# Patient Record
Sex: Female | Born: 1940 | Race: Black or African American | Hispanic: No | Marital: Married | Smoking: Former smoker
Health system: Southern US, Community
[De-identification: ages and names within clinical notes are randomized; demographics above are authoritative.]

## PROBLEM LIST (undated history)

## (undated) DIAGNOSIS — E119 Type 2 diabetes mellitus without complications: Secondary | ICD-10-CM

## (undated) DIAGNOSIS — R011 Cardiac murmur, unspecified: Secondary | ICD-10-CM

## (undated) DIAGNOSIS — G629 Polyneuropathy, unspecified: Secondary | ICD-10-CM

## (undated) DIAGNOSIS — Z9981 Dependence on supplemental oxygen: Secondary | ICD-10-CM

## (undated) DIAGNOSIS — R52 Pain, unspecified: Secondary | ICD-10-CM

## (undated) DIAGNOSIS — E039 Hypothyroidism, unspecified: Secondary | ICD-10-CM

## (undated) DIAGNOSIS — I10 Essential (primary) hypertension: Secondary | ICD-10-CM

## (undated) DIAGNOSIS — J449 Chronic obstructive pulmonary disease, unspecified: Secondary | ICD-10-CM

## (undated) DIAGNOSIS — Z87898 Personal history of other specified conditions: Secondary | ICD-10-CM

## (undated) DIAGNOSIS — M1990 Unspecified osteoarthritis, unspecified site: Secondary | ICD-10-CM

## (undated) DIAGNOSIS — IMO0001 Reserved for inherently not codable concepts without codable children: Secondary | ICD-10-CM

## (undated) DIAGNOSIS — I499 Cardiac arrhythmia, unspecified: Secondary | ICD-10-CM

## (undated) DIAGNOSIS — R062 Wheezing: Secondary | ICD-10-CM

## (undated) HISTORY — PX: BREAST BIOPSY: SHX20

## (undated) HISTORY — PX: TUBAL LIGATION: SHX77

## (undated) HISTORY — PX: HERNIA REPAIR: SHX51

## (undated) HISTORY — PX: ABDOMINAL HYSTERECTOMY: SHX81

---

## 2005-01-10 ENCOUNTER — Other Ambulatory Visit: Payer: Self-pay

## 2005-01-10 ENCOUNTER — Emergency Department: Payer: Self-pay | Admitting: Emergency Medicine

## 2005-03-01 ENCOUNTER — Ambulatory Visit: Payer: Self-pay | Admitting: Internal Medicine

## 2005-05-22 ENCOUNTER — Ambulatory Visit: Payer: Self-pay

## 2005-07-11 ENCOUNTER — Ambulatory Visit: Payer: Self-pay | Admitting: Internal Medicine

## 2005-07-23 ENCOUNTER — Ambulatory Visit: Payer: Self-pay | Admitting: Internal Medicine

## 2005-11-20 ENCOUNTER — Ambulatory Visit: Payer: Self-pay | Admitting: Internal Medicine

## 2006-01-10 ENCOUNTER — Ambulatory Visit: Payer: Self-pay

## 2006-11-25 ENCOUNTER — Ambulatory Visit: Payer: Self-pay | Admitting: Internal Medicine

## 2007-08-05 ENCOUNTER — Ambulatory Visit: Payer: Self-pay | Admitting: Family Medicine

## 2008-03-02 LAB — HM DEXA SCAN: HM Dexa Scan: NORMAL

## 2008-04-06 ENCOUNTER — Ambulatory Visit: Payer: Self-pay | Admitting: Gastroenterology

## 2008-04-06 LAB — HM COLONOSCOPY: HM Colonoscopy: NORMAL

## 2008-08-05 ENCOUNTER — Ambulatory Visit: Payer: Self-pay | Admitting: Family Medicine

## 2009-01-29 ENCOUNTER — Emergency Department: Payer: Self-pay | Admitting: Unknown Physician Specialty

## 2009-09-21 ENCOUNTER — Ambulatory Visit: Payer: Self-pay | Admitting: Family Medicine

## 2010-09-29 ENCOUNTER — Ambulatory Visit: Payer: Self-pay | Admitting: Family Medicine

## 2010-10-13 ENCOUNTER — Emergency Department: Payer: Self-pay | Admitting: Emergency Medicine

## 2011-04-26 ENCOUNTER — Ambulatory Visit: Payer: Self-pay | Admitting: Family Medicine

## 2011-05-22 ENCOUNTER — Ambulatory Visit: Payer: Self-pay | Admitting: Surgery

## 2011-10-24 ENCOUNTER — Ambulatory Visit: Payer: Self-pay | Admitting: Family Medicine

## 2012-07-23 ENCOUNTER — Ambulatory Visit: Payer: Self-pay | Admitting: Family Medicine

## 2012-10-15 ENCOUNTER — Ambulatory Visit: Payer: Self-pay | Admitting: Family Medicine

## 2013-01-06 ENCOUNTER — Ambulatory Visit: Payer: Self-pay | Admitting: Otolaryngology

## 2013-01-19 ENCOUNTER — Ambulatory Visit: Payer: Self-pay | Admitting: Family Medicine

## 2013-06-03 LAB — HM PAP SMEAR: HM Pap smear: ABNORMAL

## 2014-01-22 ENCOUNTER — Ambulatory Visit: Payer: Self-pay | Admitting: Family Medicine

## 2014-01-22 LAB — HM MAMMOGRAPHY

## 2014-07-20 LAB — CBC AND DIFFERENTIAL
HCT: 36 % (ref 36–46)
Hemoglobin: 11.3 g/dL — AB (ref 12.0–16.0)
Neutrophils Absolute: 8 /uL
Platelets: 240 10*3/uL (ref 150–399)
WBC: 9.6 10^3/mL

## 2014-10-19 ENCOUNTER — Ambulatory Visit (INDEPENDENT_AMBULATORY_CARE_PROVIDER_SITE_OTHER): Payer: Medicare Other

## 2014-10-19 ENCOUNTER — Ambulatory Visit (INDEPENDENT_AMBULATORY_CARE_PROVIDER_SITE_OTHER): Payer: Medicare Other | Admitting: Podiatry

## 2014-10-19 ENCOUNTER — Encounter: Payer: Self-pay | Admitting: Podiatry

## 2014-10-19 VITALS — BP 149/76 | HR 111 | Resp 16 | Ht 67.0 in | Wt 257.0 lb

## 2014-10-19 DIAGNOSIS — M7740 Metatarsalgia, unspecified foot: Secondary | ICD-10-CM

## 2014-10-19 DIAGNOSIS — M899 Disorder of bone, unspecified: Secondary | ICD-10-CM

## 2014-10-19 DIAGNOSIS — M779 Enthesopathy, unspecified: Secondary | ICD-10-CM

## 2014-10-19 DIAGNOSIS — E1149 Type 2 diabetes mellitus with other diabetic neurological complication: Secondary | ICD-10-CM

## 2014-10-19 DIAGNOSIS — M2040 Other hammer toe(s) (acquired), unspecified foot: Secondary | ICD-10-CM

## 2014-10-19 DIAGNOSIS — E1140 Type 2 diabetes mellitus with diabetic neuropathy, unspecified: Secondary | ICD-10-CM

## 2014-10-19 DIAGNOSIS — M2010 Hallux valgus (acquired), unspecified foot: Secondary | ICD-10-CM

## 2014-10-19 NOTE — Patient Instructions (Signed)
Diabetes and Foot Care Diabetes may cause you to have problems because of poor blood supply (circulation) to your feet and legs. This may cause the skin on your feet to become thinner, break easier, and heal more slowly. Your skin may become dry, and the skin may peel and crack. You may also have nerve damage in your legs and feet causing decreased feeling in them. You may not notice minor injuries to your feet that could lead to infections or more serious problems. Taking care of your feet is one of the most important things you can do for yourself.  HOME CARE INSTRUCTIONS  Wear shoes at all times, even in the house. Do not go barefoot. Bare feet are easily injured.  Check your feet daily for blisters, cuts, and redness. If you cannot see the bottom of your feet, use a mirror or ask someone for help.  Wash your feet with warm water (do not use hot water) and mild soap. Then pat your feet and the areas between your toes until they are completely dry. Do not soak your feet as this can dry your skin.  Apply a moisturizing lotion or petroleum jelly (that does not contain alcohol and is unscented) to the skin on your feet and to dry, brittle toenails. Do not apply lotion between your toes.  Trim your toenails straight across. Do not dig under them or around the cuticle. File the edges of your nails with an emery board or nail file.  Do not cut corns or calluses or try to remove them with medicine.  Wear clean socks or stockings every day. Make sure they are not too tight. Do not wear knee-high stockings since they may decrease blood flow to your legs.  Wear shoes that fit properly and have enough cushioning. To break in new shoes, wear them for just a few hours a day. This prevents you from injuring your feet. Always look in your shoes before you put them on to be sure there are no objects inside.  Do not cross your legs. This may decrease the blood flow to your feet.  If you find a minor scrape,  cut, or break in the skin on your feet, keep it and the skin around it clean and dry. These areas may be cleansed with mild soap and water. Do not cleanse the area with peroxide, alcohol, or iodine.  When you remove an adhesive bandage, be sure not to damage the skin around it.  If you have a wound, look at it several times a day to make sure it is healing.  Do not use heating pads or hot water bottles. They may burn your skin. If you have lost feeling in your feet or legs, you may not know it is happening until it is too late.  Make sure your health care provider performs a complete foot exam at least annually or more often if you have foot problems. Report any cuts, sores, or bruises to your health care provider immediately. SEEK MEDICAL CARE IF:   You have an injury that is not healing.  You have cuts or breaks in the skin.  You have an ingrown nail.  You notice redness on your legs or feet.  You feel burning or tingling in your legs or feet.  You have pain or cramps in your legs and feet.  Your legs or feet are numb.  Your feet always feel cold. SEEK IMMEDIATE MEDICAL CARE IF:   There is increasing redness,   swelling, or pain in or around a wound.  There is a red line that goes up your leg.  Pus is coming from a wound.  You develop a fever or as directed by your health care provider.  You notice a bad smell coming from an ulcer or wound. Document Released: 10/19/2000 Document Revised: 06/24/2013 Document Reviewed: 03/31/2013 Exit[employer] Patient Information 2015 [employer]. This information is not intended to replace advice given to you by your health care provider. Make sure you discuss any questions you have with your health care provider.  

## 2014-10-19 NOTE — Progress Notes (Signed)
   Subjective:    Patient ID: Kristen Schroeder, female    DOB: 09-Nov-1940, 73 y.o.   MRN: [id number]  HPI Comments: 73 year old female presents the office they with her husband for complaints of pain in the bottom of her feet bilaterally. She points to the area of the plantar first MTPJ. She states that she has discomfort in this area which is been ongoing for approximately one year. She denies any recent injury or trauma to the area.  She denies any recent increase in swelling, redness or warmth to the area. She's been soaking her feet Epsom salts as well as alcohol.She has been applying over-the-counter creams without any resolution of symptoms. The patient is diabetic and she states her blood sugars typically run between 123-150. She denies any history of ulceration. She does have some tingling and numbness to her feet for which she is on gabapentin. She denies any intermittent claudication symptoms. No other complaints at this time.  Foot Pain      Review of Systems  Musculoskeletal: Positive for back pain.  All other systems reviewed and are negative.      Objective:   Physical Exam AAO 3, NAD DP/PT pulses palpable bilaterally, CRT less than 3 seconds  Protective sensation slightly decreased with Simms Weinstein monofilament, vibratory sensation decreased, Achilles tendon reflex intact. There is mild discomfort upon palpation on the plantar aspect of the first MTPJ along the sesamoid complex bilaterally. There is prominent sesamoids and metatarsal heads plantarly with atrophy of the fat pad bilaterally. There is no pain with vibratory sensation over the areas. There is no increase in edema, erythema, increase in warmth. There is mild HAV deformity with hammertoe contractures present. There is limitation of first MTPJ range of motion bilaterally. MMT 5/5, ROM WNL except othewise noted.  No open lesions or pre-ulcerative lesions Nails dystrophic, discolored, slightly hypertrophic. They're  not elongated currently at this time as she has recently cut them No pain with calf compression, swelling, warmth, erythema.       Assessment & Plan:  73 year old female presents for bilateral foot pain localized mostly under the sesamoid complex bilaterally.  -X-rays were obtained and reviewed with the patient.  -Treatment options were discussed with the patient including alternatives, risks, complications.  -Discussed with the patient likely etiology of her pain. Due to the diabetes with neuropathy and structural changes to her foot, she would likely benefit from diabetic inserts for shoes. I provided the patient with a prescription today to go to [facility] to have her fitted for shoes/insert. This will also help alleviate some of the high-pressure areas on her foot.  -Discussed the patient the importance of daily foot inspection. Also discussed with her  that due to the likely onychomycosis and her nails if they're painful we can debride them for her. Recommend follow-up every 3 months for diabetic risk assessment/nail care  -Prescribed a compound cream to apply over the symptomatic area.  -Follow-up in 3 months or sooner should any problems arise. In the meantime, call the office with any questions, concerns, change in symptoms.

## 2014-11-14 DIAGNOSIS — J449 Chronic obstructive pulmonary disease, unspecified: Secondary | ICD-10-CM | POA: Diagnosis not present

## 2014-11-18 DIAGNOSIS — E785 Hyperlipidemia, unspecified: Secondary | ICD-10-CM | POA: Diagnosis not present

## 2014-11-18 DIAGNOSIS — Z23 Encounter for immunization: Secondary | ICD-10-CM | POA: Diagnosis not present

## 2014-11-18 DIAGNOSIS — E669 Obesity, unspecified: Secondary | ICD-10-CM | POA: Diagnosis not present

## 2014-11-18 DIAGNOSIS — E1140 Type 2 diabetes mellitus with diabetic neuropathy, unspecified: Secondary | ICD-10-CM | POA: Diagnosis not present

## 2014-11-18 DIAGNOSIS — I10 Essential (primary) hypertension: Secondary | ICD-10-CM | POA: Diagnosis not present

## 2014-11-18 LAB — LIPID PANEL
Cholesterol: 170 mg/dL (ref 0–200)
HDL: 52 mg/dL (ref 35–70)
LDL Cholesterol: 88 mg/dL
LDl/HDL Ratio: 1.7
Triglycerides: 150 mg/dL (ref 40–160)

## 2014-12-15 DIAGNOSIS — J449 Chronic obstructive pulmonary disease, unspecified: Secondary | ICD-10-CM | POA: Diagnosis not present

## 2014-12-29 ENCOUNTER — Ambulatory Visit: Payer: Self-pay | Admitting: Family Medicine

## 2014-12-29 DIAGNOSIS — R079 Chest pain, unspecified: Secondary | ICD-10-CM | POA: Diagnosis not present

## 2014-12-29 DIAGNOSIS — R0600 Dyspnea, unspecified: Secondary | ICD-10-CM | POA: Diagnosis not present

## 2014-12-29 DIAGNOSIS — R091 Pleurisy: Secondary | ICD-10-CM | POA: Diagnosis not present

## 2014-12-29 DIAGNOSIS — M79601 Pain in right arm: Secondary | ICD-10-CM | POA: Diagnosis not present

## 2014-12-29 DIAGNOSIS — Z23 Encounter for immunization: Secondary | ICD-10-CM | POA: Diagnosis not present

## 2015-01-13 DIAGNOSIS — J449 Chronic obstructive pulmonary disease, unspecified: Secondary | ICD-10-CM | POA: Diagnosis not present

## 2015-01-18 ENCOUNTER — Ambulatory Visit (INDEPENDENT_AMBULATORY_CARE_PROVIDER_SITE_OTHER): Payer: Medicare Other | Admitting: Podiatry

## 2015-01-18 DIAGNOSIS — E1149 Type 2 diabetes mellitus with other diabetic neurological complication: Secondary | ICD-10-CM

## 2015-01-18 DIAGNOSIS — M79676 Pain in unspecified toe(s): Secondary | ICD-10-CM | POA: Diagnosis not present

## 2015-01-18 DIAGNOSIS — B351 Tinea unguium: Secondary | ICD-10-CM | POA: Diagnosis not present

## 2015-01-18 DIAGNOSIS — E1140 Type 2 diabetes mellitus with diabetic neuropathy, unspecified: Secondary | ICD-10-CM | POA: Diagnosis not present

## 2015-01-18 NOTE — Patient Instructions (Signed)
Diabetes and Foot Care Diabetes may cause you to have problems because of poor blood supply (circulation) to your feet and legs. This may cause the skin on your feet to become thinner, break easier, and heal more slowly. Your skin may become dry, and the skin may peel and crack. You may also have nerve damage in your legs and feet causing decreased feeling in them. You may not notice minor injuries to your feet that could lead to infections or more serious problems. Taking care of your feet is one of the most important things you can do for yourself.  HOME CARE INSTRUCTIONS  Wear shoes at all times, even in the house. Do not go barefoot. Bare feet are easily injured.  Check your feet daily for blisters, cuts, and redness. If you cannot see the bottom of your feet, use a mirror or ask someone for help.  Wash your feet with warm water (do not use hot water) and mild soap. Then pat your feet and the areas between your toes until they are completely dry. Do not soak your feet as this can dry your skin.  Apply a moisturizing lotion or petroleum jelly (that does not contain alcohol and is unscented) to the skin on your feet and to dry, brittle toenails. Do not apply lotion between your toes.  Trim your toenails straight across. Do not dig under them or around the cuticle. File the edges of your nails with an emery board or nail file.  Do not cut corns or calluses or try to remove them with medicine.  Wear clean socks or stockings every day. Make sure they are not too tight. Do not wear knee-high stockings since they may decrease blood flow to your legs.  Wear shoes that fit properly and have enough cushioning. To break in new shoes, wear them for just a few hours a day. This prevents you from injuring your feet. Always look in your shoes before you put them on to be sure there are no objects inside.  Do not cross your legs. This may decrease the blood flow to your feet.  If you find a minor scrape,  cut, or break in the skin on your feet, keep it and the skin around it clean and dry. These areas may be cleansed with mild soap and water. Do not cleanse the area with peroxide, alcohol, or iodine.  When you remove an adhesive bandage, be sure not to damage the skin around it.  If you have a wound, look at it several times a day to make sure it is healing.  Do not use heating pads or hot water bottles. They may burn your skin. If you have lost feeling in your feet or legs, you may not know it is happening until it is too late.  Make sure your health care provider performs a complete foot exam at least annually or more often if you have foot problems. Report any cuts, sores, or bruises to your health care provider immediately. SEEK MEDICAL CARE IF:   You have an injury that is not healing.  You have cuts or breaks in the skin.  You have an ingrown nail.  You notice redness on your legs or feet.  You feel burning or tingling in your legs or feet.  You have pain or cramps in your legs and feet.  Your legs or feet are numb.  Your feet always feel cold. SEEK IMMEDIATE MEDICAL CARE IF:   There is increasing redness,   swelling, or pain in or around a wound.  There is a red line that goes up your leg.  Pus is coming from a wound.  You develop a fever or as directed by your health care provider.  You notice a bad smell coming from an ulcer or wound. Document Released: 10/19/2000 Document Revised: 06/24/2013 Document Reviewed: 03/31/2013 Exit[employer] Patient Information 2015 [employer]. This information is not intended to replace advice given to you by your health care provider. Make sure you discuss any questions you have with your health care provider.  

## 2015-01-19 NOTE — Progress Notes (Signed)
Patient ID: Kristen KaufmanRuthie L Schroeder, female   DOB: [birth year], 74 y.o.   MRN: [id number]  Subjective: 74 y.o.-year-old female returns the office today for painful, elongated, thickened toenails. Denies any redness or drainage around the nails. States that she has not looked into getting diabetic shoes that she lost the prescription previously. She would like to proceed with diabetic she is a possible. She states that she intermittently continues to have pain on the balls of her feet bilaterally however she denies any swelling or redness overlying the area. Denies any recent injury or trauma. Denies any acute changes since last appointment and no new complaints today. Denies any systemic complaints such as fevers, chills, nausea, vomiting.   Objective: AAO 3, NAD DP/PT pulses palpable, CRT less than 3 seconds Protective sensation decreased with Simms Weinstein monofilament, Achilles tendon reflex intact.  Nails hypertrophic, dystrophic, elongated, brittle, discolored 10. There is tenderness overlying these nails. There is no surrounding erythema or drainage along the nail sites. No open lesions or pre-ulcerative lesions are identified. There is a decrease in medial arch height upon weightbearing and hammertoe contractures are present bilaterally. There is mild diffuse tenderness along the submetatarsal heads 1-5 bilaterally. There is atrophy of the fat pad and prominent metatarsal heads plantarly. There is no area pinpoint bony tenderness or pain with vibratory sensation. There is no pain with MTPJ range of motion. No other areas of tenderness bilateral lower extremities. No overlying edema, erythema, increased warmth. No pain with calf compression, swelling, warmth, erythema.  Assessment: Patient presents with symptomatic onychomycosis; metatarsalgia   Plan: -Treatment options including alternatives, risks, complications were discussed -Nails sharply debrided 10 without complication/bleeding. -Discussed  daily foot inspection. If there are any changes, to call the office immediately.  -I completed paperwork for precertification for diabetic shoes. -Follow-up in 3 months or sooner if any problems are to arise. In the meantime, encouraged to call the office with any questions, concerns, changes symptoms.

## 2015-01-24 DIAGNOSIS — I10 Essential (primary) hypertension: Secondary | ICD-10-CM | POA: Diagnosis not present

## 2015-01-24 DIAGNOSIS — E119 Type 2 diabetes mellitus without complications: Secondary | ICD-10-CM | POA: Diagnosis not present

## 2015-01-24 DIAGNOSIS — R0602 Shortness of breath: Secondary | ICD-10-CM | POA: Diagnosis not present

## 2015-01-24 DIAGNOSIS — I272 Other secondary pulmonary hypertension: Secondary | ICD-10-CM | POA: Diagnosis not present

## 2015-02-01 DIAGNOSIS — E669 Obesity, unspecified: Secondary | ICD-10-CM | POA: Diagnosis not present

## 2015-02-01 DIAGNOSIS — Z23 Encounter for immunization: Secondary | ICD-10-CM | POA: Diagnosis not present

## 2015-02-01 DIAGNOSIS — E1140 Type 2 diabetes mellitus with diabetic neuropathy, unspecified: Secondary | ICD-10-CM | POA: Diagnosis not present

## 2015-02-03 ENCOUNTER — Telehealth: Payer: Self-pay | Admitting: Podiatry

## 2015-02-03 NOTE — Telephone Encounter (Signed)
LEFT MESSAGE TO PATIENT TO CALL AND SET UP APPOINTMENT WITH DR.WAGONER TO BE SCANNED FOR DIABETIC SHOES

## 2015-02-13 DIAGNOSIS — J449 Chronic obstructive pulmonary disease, unspecified: Secondary | ICD-10-CM | POA: Diagnosis not present

## 2015-02-15 ENCOUNTER — Encounter: Payer: Self-pay | Admitting: Podiatry

## 2015-02-15 ENCOUNTER — Ambulatory Visit (INDEPENDENT_AMBULATORY_CARE_PROVIDER_SITE_OTHER): Payer: Medicare Other | Admitting: Podiatry

## 2015-02-15 DIAGNOSIS — E1140 Type 2 diabetes mellitus with diabetic neuropathy, unspecified: Secondary | ICD-10-CM

## 2015-02-15 DIAGNOSIS — E1149 Type 2 diabetes mellitus with other diabetic neurological complication: Secondary | ICD-10-CM

## 2015-02-15 NOTE — Progress Notes (Signed)
Patient ID: Gwyndolyn KaufmanRuthie L Schroeder, female   DOB: 25-May-1941, 74 y.o.   MRN: [id number]  Patient was measured for diabetic shoes today. Will follow up once shoes arrive. She had no new complaints.

## 2015-02-16 DIAGNOSIS — Z23 Encounter for immunization: Secondary | ICD-10-CM | POA: Diagnosis not present

## 2015-02-16 DIAGNOSIS — J40 Bronchitis, not specified as acute or chronic: Secondary | ICD-10-CM | POA: Diagnosis not present

## 2015-03-10 DIAGNOSIS — I270 Primary pulmonary hypertension: Secondary | ICD-10-CM | POA: Diagnosis not present

## 2015-03-10 DIAGNOSIS — J9611 Chronic respiratory failure with hypoxia: Secondary | ICD-10-CM | POA: Diagnosis not present

## 2015-03-10 DIAGNOSIS — J441 Chronic obstructive pulmonary disease with (acute) exacerbation: Secondary | ICD-10-CM | POA: Diagnosis not present

## 2015-03-15 DIAGNOSIS — J449 Chronic obstructive pulmonary disease, unspecified: Secondary | ICD-10-CM | POA: Diagnosis not present

## 2015-03-17 DIAGNOSIS — Z23 Encounter for immunization: Secondary | ICD-10-CM | POA: Diagnosis not present

## 2015-03-17 DIAGNOSIS — J309 Allergic rhinitis, unspecified: Secondary | ICD-10-CM | POA: Diagnosis not present

## 2015-03-17 DIAGNOSIS — J301 Allergic rhinitis due to pollen: Secondary | ICD-10-CM | POA: Diagnosis not present

## 2015-03-17 DIAGNOSIS — I10 Essential (primary) hypertension: Secondary | ICD-10-CM | POA: Diagnosis not present

## 2015-03-17 DIAGNOSIS — J9611 Chronic respiratory failure with hypoxia: Secondary | ICD-10-CM | POA: Diagnosis not present

## 2015-03-17 DIAGNOSIS — I270 Primary pulmonary hypertension: Secondary | ICD-10-CM | POA: Diagnosis not present

## 2015-03-17 DIAGNOSIS — J449 Chronic obstructive pulmonary disease, unspecified: Secondary | ICD-10-CM | POA: Diagnosis not present

## 2015-03-17 DIAGNOSIS — E039 Hypothyroidism, unspecified: Secondary | ICD-10-CM | POA: Diagnosis not present

## 2015-03-17 DIAGNOSIS — E780 Pure hypercholesterolemia: Secondary | ICD-10-CM | POA: Diagnosis not present

## 2015-03-17 DIAGNOSIS — E119 Type 2 diabetes mellitus without complications: Secondary | ICD-10-CM | POA: Diagnosis not present

## 2015-03-17 LAB — HEPATIC FUNCTION PANEL
ALT: 5 U/L — AB (ref 7–35)
AST: 13 U/L (ref 13–35)
Alkaline Phosphatase: 87 U/L (ref 25–125)

## 2015-03-17 LAB — TSH: TSH: 2.51 u[IU]/mL (ref 0.41–5.90)

## 2015-03-17 LAB — BASIC METABOLIC PANEL
BUN: 16 mg/dL (ref 4–21)
Creatinine: 0.7 mg/dL (ref 0.5–1.1)
Glucose: 89 mg/dL
Potassium: 3.8 mmol/L (ref 3.4–5.3)
Sodium: 147 mmol/L (ref 137–147)

## 2015-03-17 LAB — HEMOGLOBIN A1C: Hgb A1c MFr Bld: 5.3 % (ref 4.0–6.0)

## 2015-03-29 ENCOUNTER — Other Ambulatory Visit: Payer: Self-pay | Admitting: Family Medicine

## 2015-03-29 ENCOUNTER — Ambulatory Visit
Admission: RE | Admit: 2015-03-29 | Discharge: 2015-03-29 | Disposition: A | Discharge status: Home / Self Care | Payer: Medicare Other | Source: Ambulatory Visit | Attending: Family Medicine | Admitting: Family Medicine

## 2015-03-29 DIAGNOSIS — Z23 Encounter for immunization: Secondary | ICD-10-CM | POA: Diagnosis not present

## 2015-03-29 DIAGNOSIS — J449 Chronic obstructive pulmonary disease, unspecified: Secondary | ICD-10-CM | POA: Diagnosis not present

## 2015-03-29 DIAGNOSIS — J441 Chronic obstructive pulmonary disease with (acute) exacerbation: Secondary | ICD-10-CM

## 2015-03-29 DIAGNOSIS — R05 Cough: Secondary | ICD-10-CM | POA: Diagnosis not present

## 2015-04-15 DIAGNOSIS — J449 Chronic obstructive pulmonary disease, unspecified: Secondary | ICD-10-CM | POA: Diagnosis not present

## 2015-04-19 ENCOUNTER — Ambulatory Visit: Payer: Medicare Other

## 2015-04-19 ENCOUNTER — Other Ambulatory Visit: Payer: Self-pay | Admitting: Family Medicine

## 2015-04-26 ENCOUNTER — Encounter: Payer: Self-pay | Admitting: Family Medicine

## 2015-04-27 ENCOUNTER — Other Ambulatory Visit: Payer: Self-pay | Admitting: Family Medicine

## 2015-04-27 DIAGNOSIS — Z1231 Encounter for screening mammogram for malignant neoplasm of breast: Secondary | ICD-10-CM

## 2015-05-02 ENCOUNTER — Other Ambulatory Visit: Payer: Self-pay | Admitting: Family Medicine

## 2015-05-02 ENCOUNTER — Ambulatory Visit
Admission: RE | Admit: 2015-05-02 | Discharge: 2015-05-02 | Disposition: A | Discharge status: Home / Self Care | Payer: Medicare Other | Source: Ambulatory Visit | Attending: Family Medicine | Admitting: Family Medicine

## 2015-05-02 DIAGNOSIS — Z1231 Encounter for screening mammogram for malignant neoplasm of breast: Secondary | ICD-10-CM | POA: Diagnosis not present

## 2015-05-05 ENCOUNTER — Telehealth: Payer: Self-pay | Admitting: Family Medicine

## 2015-05-05 NOTE — Telephone Encounter (Signed)
Pt called having pain under rib cage .Marland Kitchen. Thinks it might be gas.  Can you call her back.  [phone].  Thanks, tp

## 2015-05-05 NOTE — Telephone Encounter (Signed)
Called, no answer, unable to leave a message-aa

## 2015-05-10 NOTE — Telephone Encounter (Signed)
Patient  Has been advised to be seen.  She states the pain under her ribs is gone but she does have pelvic pain.  No change in BM, no urinary symptoms and no vaginal d/c.    Explained that it is difficult to diagnose this type of symptoms over the phone.  Patient understood and is making appointment.

## 2015-05-11 ENCOUNTER — Encounter: Payer: Self-pay | Admitting: Emergency Medicine

## 2015-05-11 ENCOUNTER — Encounter: Payer: Self-pay | Admitting: Family Medicine

## 2015-05-11 ENCOUNTER — Ambulatory Visit
Admission: RE | Admit: 2015-05-11 | Discharge: 2015-05-11 | Disposition: A | Discharge status: Home / Self Care | Payer: Medicare Other | Source: Ambulatory Visit | Attending: Family Medicine | Admitting: Family Medicine

## 2015-05-11 ENCOUNTER — Telehealth: Payer: Self-pay | Admitting: Emergency Medicine

## 2015-05-11 ENCOUNTER — Ambulatory Visit (INDEPENDENT_AMBULATORY_CARE_PROVIDER_SITE_OTHER): Payer: Medicare Other | Admitting: Family Medicine

## 2015-05-11 VITALS — BP 122/62 | HR 86 | Temp 97.7°F | Resp 16 | Wt 240.0 lb

## 2015-05-11 DIAGNOSIS — E119 Type 2 diabetes mellitus without complications: Secondary | ICD-10-CM | POA: Insufficient documentation

## 2015-05-11 DIAGNOSIS — E785 Hyperlipidemia, unspecified: Secondary | ICD-10-CM | POA: Insufficient documentation

## 2015-05-11 DIAGNOSIS — F419 Anxiety disorder, unspecified: Secondary | ICD-10-CM | POA: Insufficient documentation

## 2015-05-11 DIAGNOSIS — M79604 Pain in right leg: Secondary | ICD-10-CM | POA: Diagnosis not present

## 2015-05-11 DIAGNOSIS — M25551 Pain in right hip: Secondary | ICD-10-CM | POA: Diagnosis not present

## 2015-05-11 DIAGNOSIS — J42 Unspecified chronic bronchitis: Secondary | ICD-10-CM | POA: Insufficient documentation

## 2015-05-11 DIAGNOSIS — R6889 Other general symptoms and signs: Secondary | ICD-10-CM | POA: Insufficient documentation

## 2015-05-11 DIAGNOSIS — M25559 Pain in unspecified hip: Secondary | ICD-10-CM | POA: Diagnosis present

## 2015-05-11 DIAGNOSIS — G629 Polyneuropathy, unspecified: Secondary | ICD-10-CM | POA: Insufficient documentation

## 2015-05-11 DIAGNOSIS — F32A Depression, unspecified: Secondary | ICD-10-CM | POA: Insufficient documentation

## 2015-05-11 DIAGNOSIS — E039 Hypothyroidism, unspecified: Secondary | ICD-10-CM | POA: Insufficient documentation

## 2015-05-11 DIAGNOSIS — I10 Essential (primary) hypertension: Secondary | ICD-10-CM | POA: Insufficient documentation

## 2015-05-11 DIAGNOSIS — I509 Heart failure, unspecified: Secondary | ICD-10-CM | POA: Insufficient documentation

## 2015-05-11 DIAGNOSIS — F329 Major depressive disorder, single episode, unspecified: Secondary | ICD-10-CM | POA: Insufficient documentation

## 2015-05-11 DIAGNOSIS — K219 Gastro-esophageal reflux disease without esophagitis: Secondary | ICD-10-CM | POA: Insufficient documentation

## 2015-05-11 DIAGNOSIS — G4733 Obstructive sleep apnea (adult) (pediatric): Secondary | ICD-10-CM | POA: Insufficient documentation

## 2015-05-11 DIAGNOSIS — E669 Obesity, unspecified: Secondary | ICD-10-CM | POA: Insufficient documentation

## 2015-05-11 DIAGNOSIS — N870 Mild cervical dysplasia: Secondary | ICD-10-CM | POA: Insufficient documentation

## 2015-05-11 DIAGNOSIS — M5137 Other intervertebral disc degeneration, lumbosacral region: Secondary | ICD-10-CM | POA: Insufficient documentation

## 2015-05-11 DIAGNOSIS — M1611 Unilateral primary osteoarthritis, right hip: Secondary | ICD-10-CM | POA: Diagnosis not present

## 2015-05-11 DIAGNOSIS — J309 Allergic rhinitis, unspecified: Secondary | ICD-10-CM | POA: Insufficient documentation

## 2015-05-11 DIAGNOSIS — M1990 Unspecified osteoarthritis, unspecified site: Secondary | ICD-10-CM | POA: Insufficient documentation

## 2015-05-11 NOTE — Telephone Encounter (Signed)
-----   Message from Maple Hudsonichard L Gilbert Jr., MD sent at 05/11/2015  1:16 PM EDT ----- Arthritic changes noted.

## 2015-05-11 NOTE — Telephone Encounter (Signed)
Pt informed of xray results. Wants to know if there is anything that she can take for it?

## 2015-05-11 NOTE — Progress Notes (Signed)
Patient ID: Kristen Schroeder, female   DOB: [birth year], 74 y.o.   MRN: [id number]    Subjective:  Leg Pain  The incident occurred more than 1 week ago. There was no injury mechanism. Pain location: right groin area. The quality of the pain is described as aching. The pain has been improving since onset. Pertinent negatives include no inability to bear weight, loss of motion, loss of sensation, muscle weakness, numbness or tingling. She reports no foreign bodies present. The symptoms are aggravated by movement. She has tried acetaminophen for the symptoms.  Pt reports that she was hurting in her right side, she had been having a lot of gas and then her leg/hip/groin started hurting but reports that she is getting better.  No urinary sx, or nausea vomiting.   Prior to Admission medications   Medication Sig Start Date End Date Taking? Authorizing Provider  ALBUTEROL IN Inhale into the lungs daily.   Yes Historical Provider, MD  ALPRAZolam Prudy Feeler) 0.5 MG tablet Take by mouth. 12/26/14  Yes Historical Provider, MD  cetirizine (ZYRTEC) 10 MG tablet Take 10 mg by mouth daily.   Yes Historical Provider, MD  DULERA 100-5 MCG/ACT AERO  09/06/14  Yes Historical Provider, MD  fluticasone (FLONASE) 50 MCG/ACT nasal spray Place into both nostrils daily.   Yes Historical Provider, MD  furosemide (LASIX) 20 MG tablet Take by mouth. 12/20/14  Yes Historical Provider, MD  gabapentin (NEURONTIN) 300 MG capsule Take by mouth. 07/19/14  Yes Historical Provider, MD  hydrochlorothiazide (HYDRODIURIL) 25 MG tablet Take by mouth. 09/21/13  Yes Historical Provider, MD  HYDROcodone-acetaminophen (NORCO) 10-325 MG per tablet Take by mouth. 02/01/15  Yes Historical Provider, MD  ipratropium-albuterol (DUONEB) 0.5-2.5 (3) MG/3ML SOLN Take 3 mLs by nebulization daily.   Yes Historical Provider, MD  levothyroxine (SYNTHROID, LEVOTHROID) 125 MCG tablet Take by mouth. 03/15/15  Yes Historical Provider, MD  lisinopril (PRINIVIL,ZESTRIL) 10  MG tablet Take by mouth. 03/24/15  Yes Historical Provider, MD  loratadine (CLARITIN) 10 MG tablet Take by mouth. 08/24/14  Yes Historical Provider, MD  metFORMIN (GLUCOPHAGE) 1000 MG tablet Take by mouth. 11/16/14  Yes Historical Provider, MD  MULTIPLE VITAMIN PO Take by mouth.   Yes Historical Provider, MD  Omeprazole 20 MG TBEC Take by mouth. 12/08/13  Yes Historical Provider, MD  pravastatin (PRAVACHOL) 10 MG tablet Take by mouth. 11/19/14  Yes Historical Provider, MD  SPIRIVA HANDIHALER 18 MCG inhalation capsule  09/06/14  Yes Historical Provider, MD  cyclobenzaprine (FLEXERIL) 10 MG tablet Take 10 mg by mouth daily.    Historical Provider, MD    Patient Active Problem List   Diagnosis Date Noted  . Nonspecific abnormal finding 05/11/2015  . Allergic rhinitis 05/11/2015  . Anxiety 05/11/2015  . Bronchitis, chronic 05/11/2015  . CCF (congestive cardiac failure) 05/11/2015  . Clinical depression 05/11/2015  . Diabetes mellitus, type 2 05/11/2015  . 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 Essential (primary) hypertension 05/11/2015  . Acid reflux 05/11/2015  . HLD (hyperlipidemia) 05/11/2015  . Adult hypothyroidism 05/11/2015  . Cervical dysplasia, mild 05/11/2015  . Neuropathy 05/11/2015  . Adiposity 05/11/2015  . Obstructive apnea 05/11/2015  . Arthritis, degenerative 05/11/2015    History reviewed. No pertinent past medical history.  History   Social History  . Marital Status: Married    Spouse Name: N/A  . Number of Children: N/A  . Years of Education: N/A   Occupational History  . Not on file.   Social  History Main Topics  . Smoking status: Former Smoker -- 0.50 packs/day for 15 years  . Smokeless tobacco: Not on file  . Alcohol Use: No  . Drug Use: No  . Sexual Activity: Not on file   Other Topics Concern  . Not on file   Social History Narrative    Allergies  Allergen Reactions  . Codeine Nausea And Vomiting and Nausea Only    Review  of Systems  Constitutional: Negative.   HENT: Negative.   Eyes: Negative.   Respiratory: Negative.   Cardiovascular: Negative.   Gastrointestinal: Negative.   Genitourinary: Negative.   Musculoskeletal: Positive for joint pain.  Skin: Negative.   Neurological: Negative.  Negative for tingling and numbness.  Endo/Heme/Allergies: Negative.   Psychiatric/Behavioral: Negative.     Immunization History  Administered Date(s) Administered  . Pneumococcal Conjugate-13 08/24/2014  . Pneumococcal Polysaccharide-23 11/03/1999, 07/23/2012  . Tdap 07/23/2012  . Zoster 07/23/2012   Objective:  BP 122/62 mmHg  Pulse 86  Temp(Src) 97.7 F (36.5 C) (Oral)  Resp 16  Wt 240 lb (108.863 kg)  SpO2 90%  Physical Exam  Constitutional: She is oriented to person, place, and time and well-developed, well-nourished, and in no distress.  HENT:  Head: Normocephalic and atraumatic.  Eyes: Conjunctivae are normal. Pupils are equal, round, and reactive to light.  Neck: Normal range of motion. Neck supple.  Cardiovascular: Normal rate, regular rhythm, normal heart sounds and intact distal pulses.   Pulmonary/Chest: Effort normal and breath sounds normal.  On O2 by nasal cannula  Abdominal: Soft. Bowel sounds are normal.  Musculoskeletal:  Straight leg raise negative. Figure of 4 on the affected side.  Neurological: She is alert and oriented to person, place, and time.  Lower extremity exam grossly nonfocal  Skin: Skin is warm and dry.  Psychiatric: Mood, memory, affect and judgment normal.    Lab Results  Component Value Date   WBC 9.6 07/20/2014   HGB 11.3* 07/20/2014   HCT 36 07/20/2014   PLT 240 07/20/2014   CHOL 170 11/18/2014   TRIG 150 11/18/2014   HDL 52 11/18/2014   LDLCALC 88 11/18/2014   TSH 2.51 03/17/2015   HGBA1C 5.3 03/17/2015    CMP     Component Value Date/Time   NA 147 03/17/2015   K 3.8 03/17/2015   BUN 16 03/17/2015   CREATININE 0.7 03/17/2015   AST 13  03/17/2015   ALT 5* 03/17/2015   ALKPHOS 87 03/17/2015    Assessment and Plan :  Right hip osteoarthritis  this is most likely etiology of this pain. Obtain x-ray today. Patient not a candidate for NSAID but prednisone taper may help. May also need orthopedic referral. This could be pain referral from her back but nothing is most likely hip.  Julieanne Mansonichard Gilbert MD [facility] 05/11/2015 11:05 AM

## 2015-05-12 NOTE — Telephone Encounter (Signed)
She has pain pills available for her back. I hope that enables her to get some rest.

## 2015-05-13 NOTE — Telephone Encounter (Signed)
Patient advised  ED 

## 2015-05-15 DIAGNOSIS — J449 Chronic obstructive pulmonary disease, unspecified: Secondary | ICD-10-CM | POA: Diagnosis not present

## 2015-05-16 ENCOUNTER — Other Ambulatory Visit: Payer: Self-pay | Admitting: Family Medicine

## 2015-05-20 ENCOUNTER — Telehealth: Payer: Self-pay | Admitting: Family Medicine

## 2015-05-20 DIAGNOSIS — M5136 Other intervertebral disc degeneration, lumbar region: Secondary | ICD-10-CM

## 2015-05-20 MED ORDER — HYDROCODONE-ACETAMINOPHEN 10-325 MG PO TABS: 1.0000 | ORAL_TABLET | ORAL | Status: DC | PRN

## 2015-05-20 NOTE — Telephone Encounter (Signed)
Pt contacted office for refill request on the following medications:  HYDROcodone-acetaminophen (NORCO) 10-325 MG.  YQ#[id number]/BMCB#[phone]/MW

## 2015-05-20 NOTE — Telephone Encounter (Signed)
Rx printed I hope.

## 2015-05-20 NOTE — Telephone Encounter (Signed)
Dr. Reece AgarG I need you to do this one. Thanks  ED

## 2015-05-23 ENCOUNTER — Telehealth: Payer: Self-pay

## 2015-05-23 NOTE — Telephone Encounter (Signed)
Patient states her son dropped forms for her here the week you were on vacation-[facility] [employer] [facility] [employer]- to fill out due to her been on oxygen if power or something happens she can get help. I have not seen this but I was out some of the weeks. Please review. Thank you-aa

## 2015-05-23 NOTE — Telephone Encounter (Signed)
Pt advised, RX placed up front-aa 

## 2015-05-23 NOTE — Telephone Encounter (Signed)
I will check papers at home but I think that I signed that a long time ago.

## 2015-05-24 NOTE — Telephone Encounter (Signed)
Dr. Sullivan LoneGilbert can you look at this tonight, I think this has to be in by 21st Thursday otherwise they will disconnect her service. Thank you

## 2015-05-26 NOTE — Telephone Encounter (Signed)
Pt advised tha we have the form and it was filled out at the end of June but can not say for sure if it was faxed over. Went ahead and faxed over today.-aa

## 2015-05-26 NOTE — Telephone Encounter (Signed)
LMTCB-speak to ana please-aa

## 2015-05-26 NOTE — Telephone Encounter (Signed)
I do not have any papers. I'm pretty sure assigned him. Somebody will fax over something today I will be happy to resign  on that. It is so they want her off her electricity, her being on oxygen.

## 2015-06-10 ENCOUNTER — Telehealth: Payer: Self-pay | Admitting: Family Medicine

## 2015-06-10 ENCOUNTER — Encounter: Payer: Self-pay | Admitting: Family Medicine

## 2015-06-10 ENCOUNTER — Ambulatory Visit (INDEPENDENT_AMBULATORY_CARE_PROVIDER_SITE_OTHER): Payer: Medicare Other | Admitting: Family Medicine

## 2015-06-10 VITALS — BP 140/88 | HR 84 | Temp 98.3°F | Resp 22

## 2015-06-10 DIAGNOSIS — I10 Essential (primary) hypertension: Secondary | ICD-10-CM

## 2015-06-10 DIAGNOSIS — R739 Hyperglycemia, unspecified: Secondary | ICD-10-CM | POA: Diagnosis not present

## 2015-06-10 DIAGNOSIS — E118 Type 2 diabetes mellitus with unspecified complications: Secondary | ICD-10-CM

## 2015-06-10 LAB — POCT URINALYSIS DIPSTICK
Bilirubin, UA: NEGATIVE
Blood, UA: NEGATIVE
Glucose, UA: 250
Ketones, UA: NEGATIVE
Leukocytes, UA: NEGATIVE
Nitrite, UA: NEGATIVE
Protein, UA: NEGATIVE
Spec Grav, UA: 1.01
Urobilinogen, UA: 0.2
pH, UA: 7

## 2015-06-10 LAB — POCT GLYCOSYLATED HEMOGLOBIN (HGB A1C): Hemoglobin A1C: 8.3

## 2015-06-10 MED ORDER — SAXAGLIPTIN HCL 2.5 MG PO TABS: 2.5000 mg | ORAL_TABLET | Freq: Every day | ORAL | Status: DC

## 2015-06-10 NOTE — Progress Notes (Signed)
Patient ID: Kristen Schroeder, female   DOB: April 05, 1941, 74 y.o.   MRN: [id number]   Patient: Kristen Schroeder Female    DOB: January 07, 1941   74 y.o.   MRN: [id number] Visit Date: 06/10/2015  Today's Provider: Dortha Kern, PA   Chief Complaint  Patient presents with  . Hyperglycemia   Subjective:    HPI  This 74 year old female diabetic with COPD requiring oxygen 24 hours a day at 2-4 LPM developed urinary frequency with elevated blood sugar over the past 2 weeks. Has a history to peripheral neuropathy due to diabetes. Has had BP 135-140/70-72 at home. Some blurring of eye sight. 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  Patient Active Problem List   Diagnosis Date Noted  . Nonspecific abnormal finding 05/11/2015  . Allergic rhinitis 05/11/2015  . Anxiety 05/11/2015  . Bronchitis, chronic 05/11/2015  . CCF (congestive cardiac failure) 05/11/2015  . Clinical depression 05/11/2015  . Diabetes mellitus, type 2 05/11/2015  . 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 Essential (primary) hypertension 05/11/2015  . Acid reflux 05/11/2015  . HLD (hyperlipidemia) 05/11/2015  . Adult hypothyroidism 05/11/2015  . Cervical dysplasia, mild 05/11/2015  . Neuropathy 05/11/2015  . Adiposity 05/11/2015  . Obstructive apnea 05/11/2015  . Arthritis, degenerative 05/11/2015    Past Surgical History  Procedure Laterality Date  . Abdominal hysterectomy    . Tubal ligation     Allergies  Allergen Reactions  . Codeine Nausea And Vomiting and Nausea Only   Family History  Problem Relation Age of Onset  . Heart disease Mother   . Drug abuse Other   . Hypertension Other      Previous Medications   ALBUTEROL IN    Inhale into the lungs daily.   ALPRAZOLAM (XANAX) 0.5 MG TABLET    Take by mouth.   CETIRIZINE (ZYRTEC) 10 MG TABLET    Take 10 mg by mouth daily.   CYCLOBENZAPRINE (FLEXERIL) 10 MG TABLET    Take 10 mg by mouth daily.   DULERA 100-5  MCG/ACT AERO       FLUTICASONE (FLONASE) 50 MCG/ACT NASAL SPRAY    Place into both nostrils daily.   FUROSEMIDE (LASIX) 20 MG TABLET    Take by mouth.   GABAPENTIN (NEURONTIN) 300 MG CAPSULE    Take by mouth.   HYDROCODONE-ACETAMINOPHEN (NORCO) 10-325 MG PER TABLET    Take 1-2 tablets by mouth every 4 (four) hours as needed.   IPRATROPIUM-ALBUTEROL (DUONEB) 0.5-2.5 (3) MG/3ML SOLN    Take 3 mLs by nebulization daily.   LEVOTHYROXINE (SYNTHROID, LEVOTHROID) 125 MCG TABLET    Take by mouth.   LISINOPRIL (PRINIVIL,ZESTRIL) 10 MG TABLET    Take by mouth.   LORATADINE (CLARITIN) 10 MG TABLET    Take by mouth.   METFORMIN (GLUCOPHAGE) 1000 MG TABLET    Take by mouth.   MULTIPLE VITAMIN PO    Take by mouth.   OMEPRAZOLE 20 MG TBEC    Take by mouth.   PRAVASTATIN (PRAVACHOL) 10 MG TABLET    Take by mouth.   SPIRIVA HANDIHALER 18 MCG INHALATION CAPSULE        Review of Systems  Constitutional: Negative for fever and chills.  HENT: Negative.   Eyes: Positive for visual disturbance.       Recent blurring of vision  Respiratory: Positive for shortness of breath. Negative for cough and wheezing.        Due to  COPD and on oxygen 2-4 LPM all day and night.  Cardiovascular: Negative.   Gastrointestinal: Negative.   Endocrine: Positive for polyuria.  Genitourinary: Positive for frequency.  Musculoskeletal: Positive for arthralgias.       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    History  Substance Use Topics  . Smoking status: Former Smoker -- 0.50 packs/day for 15 years  . Smokeless tobacco: Not on file  . Alcohol Use: No   Objective:   BP 190/110 mmHg  Pulse 108  Temp(Src) 98.3 F (36.8 C) (Oral)  Resp 22  SpO2 95% Repeat of BP 168/104 with pulse of 90 and 15 minutes later 140/88 with pulse of 84.  Physical Exam  Constitutional: She is oriented to person, place, and time. She appears well-developed and well-nourished. No distress.  HENT:  Head: Normocephalic and atraumatic.   Right Ear: Hearing and external ear normal.  Left Ear: Hearing and external ear normal.  Nose: Nose normal.  Eyes: Conjunctivae, EOM and lids are normal. Pupils are equal, round, and reactive to light. Right eye exhibits no discharge. Left eye exhibits no discharge. No scleral icterus.  Neck: Neck supple.  Cardiovascular: Regular rhythm.   Initially tachycardic but settled down when anxiety lessened.  Pulmonary/Chest: She is in respiratory distress.  Constant use of oxygen at 2 LPM.  Abdominal: Soft. Bowel sounds are normal.  Musculoskeletal: Normal range of motion.  Neurological: She is alert and oriented to person, place, and time.  Skin: Skin is intact. No lesion and no rash noted.  Psychiatric: Her speech is normal and behavior is normal. Thought content normal. Her mood appears anxious.      Assessment & Plan:     1. Hyperglycemia Blood sugar was 350 at home and has been ranging 250-300 recently. Presently on Metformin 1000 mg BID and trying to follow a diabetic diet. Will add Onglyza and recheck labs. - POCT HgB A1C - POCT urinalysis dipstick  2. Essential (primary) hypertension Presently better with calming of anxiety. Presently treated with Furosemide 20 mg qd and Lisinopril 10 mg qd. Continue present dosages and recheck labs. - POCT urinalysis dipstick - Comprehensive metabolic panel - CBC with Differential/Platelet - Lipid panel  3. Type 2 diabetes mellitus with complication Recent loss of control. Peripheral neuropathy unchanged. Using Gabapentin for burning discomfort in legs. Will add Onglyza 2.5 mg and keep follow up appointment for AWV next week. hgb A1C was 8.3% today. Will check follow up labs. - POCT HgB A1C - POCT urinalysis dipstick - Comprehensive metabolic panel - CBC with Differential/Platelet - Lipid panel - saxagliptin HCl (ONGLYZA) 2.5 MG TABS tablet; Take 1 tablet (2.5 mg total) by mouth daily.  Dispense: 14 tablet; Refill: 0

## 2015-06-10 NOTE — Telephone Encounter (Signed)
patien would like you to call her back about her glucose levels.  [phone]  Thanks Barth Kirks

## 2015-06-10 NOTE — Telephone Encounter (Signed)
Patient states sometimes her sugar is 209 and this morning 199 fasting, this week and some the week before that, she has had some in 300 varies. She takes Metformin 1000 mg  BID no other medications. She was on Prednisone a month ago and finished antibiotics 3 weeks ago. She has not changed her diet nothing significant. Please review. Dr. Wonda Olds' patient-aa

## 2015-06-10 NOTE — Telephone Encounter (Signed)
appt made with [facility]

## 2015-06-10 NOTE — Telephone Encounter (Signed)
She probably needs to be seen to make sure she doesn't need another medication added or to make sure she doesn't have a UTI or something.  Blood sugars can elevate with infection.  Does Dr. Sullivan Lone have any availabilities next week?

## 2015-06-11 LAB — COMPREHENSIVE METABOLIC PANEL
ALT: 6 IU/L (ref 0–32)
AST: 11 IU/L (ref 0–40)
Albumin/Globulin Ratio: 1.7 (ref 1.1–2.5)
Albumin: 3.9 g/dL (ref 3.5–4.8)
Alkaline Phosphatase: 83 IU/L (ref 39–117)
BUN/Creatinine Ratio: 13 (ref 11–26)
BUN: 8 mg/dL (ref 8–27)
Bilirubin Total: 0.3 mg/dL (ref 0.0–1.2)
CO2: 31 mmol/L — ABNORMAL HIGH (ref 18–29)
Calcium: 9.6 mg/dL (ref 8.7–10.3)
Chloride: 97 mmol/L (ref 97–108)
Creatinine, Ser: 0.61 mg/dL (ref 0.57–1.00)
GFR calc Af Amer: 104 mL/min/{1.73_m2} (ref >59)
GFR calc non Af Amer: 90 mL/min/{1.73_m2} (ref >59)
Globulin, Total: 2.3 g/dL (ref 1.5–4.5)
Glucose: 263 mg/dL — ABNORMAL HIGH (ref 65–99)
Potassium: 4.3 mmol/L (ref 3.5–5.2)
Sodium: 141 mmol/L (ref 134–144)
Total Protein: 6.2 g/dL (ref 6.0–8.5)

## 2015-06-11 LAB — CBC WITH DIFFERENTIAL/PLATELET
Basophils Absolute: 0 10*3/uL (ref 0.0–0.2)
Basos: 0 %
EOS (ABSOLUTE): 0.2 10*3/uL (ref 0.0–0.4)
Eos: 3 %
Hematocrit: 37 % (ref 34.0–46.6)
Hemoglobin: 11.3 g/dL (ref 11.1–15.9)
Immature Grans (Abs): 0 10*3/uL (ref 0.0–0.1)
Immature Granulocytes: 0 %
Lymphocytes Absolute: 1.6 10*3/uL (ref 0.7–3.1)
Lymphs: 19 %
MCH: 26.7 pg (ref 26.6–33.0)
MCHC: 30.5 g/dL — ABNORMAL LOW (ref 31.5–35.7)
MCV: 88 fL (ref 79–97)
Monocytes Absolute: 0.7 10*3/uL (ref 0.1–0.9)
Monocytes: 8 %
Neutrophils Absolute: 6 10*3/uL (ref 1.4–7.0)
Neutrophils: 70 %
Platelets: 247 10*3/uL (ref 150–379)
RBC: 4.23 x10E6/uL (ref 3.77–5.28)
RDW: 15.2 % (ref 12.3–15.4)
WBC: 8.5 10*3/uL (ref 3.4–10.8)

## 2015-06-11 LAB — LIPID PANEL
Chol/HDL Ratio: 3.3 ratio units (ref 0.0–4.4)
Cholesterol, Total: 174 mg/dL (ref 100–199)
HDL: 53 mg/dL (ref >39)
LDL Calculated: 91 mg/dL (ref 0–99)
Triglycerides: 150 mg/dL — ABNORMAL HIGH (ref 0–149)
VLDL Cholesterol Cal: 30 mg/dL (ref 5–40)

## 2015-06-13 ENCOUNTER — Telehealth: Payer: Self-pay

## 2015-06-13 NOTE — Telephone Encounter (Signed)
-----   Message from Tamsen Roers, [place] sent at 06/13/2015  8:41 AM EDT ----- Blood sugar elevation confirmed. Proceed with sample given and follow up with Dr. Sullivan Lone as planned.

## 2015-06-13 NOTE — Telephone Encounter (Signed)
Patient advised as directed below. Patient verbalized understanding.  

## 2015-06-15 ENCOUNTER — Ambulatory Visit (INDEPENDENT_AMBULATORY_CARE_PROVIDER_SITE_OTHER): Payer: Medicare Other | Admitting: Family Medicine

## 2015-06-15 ENCOUNTER — Encounter: Payer: Self-pay | Admitting: Family Medicine

## 2015-06-15 ENCOUNTER — Telehealth: Payer: Self-pay | Admitting: Family Medicine

## 2015-06-15 VITALS — BP 128/62 | HR 84 | Temp 98.4°F | Resp 16 | Ht 64.25 in | Wt 237.0 lb

## 2015-06-15 DIAGNOSIS — M25551 Pain in right hip: Secondary | ICD-10-CM | POA: Diagnosis not present

## 2015-06-15 DIAGNOSIS — J449 Chronic obstructive pulmonary disease, unspecified: Secondary | ICD-10-CM | POA: Diagnosis not present

## 2015-06-15 DIAGNOSIS — Z1211 Encounter for screening for malignant neoplasm of colon: Secondary | ICD-10-CM

## 2015-06-15 DIAGNOSIS — Z0000 Encounter for general adult medical examination without abnormal findings: Secondary | ICD-10-CM | POA: Diagnosis not present

## 2015-06-15 LAB — IFOBT (OCCULT BLOOD): IFOBT: NEGATIVE

## 2015-06-15 NOTE — Progress Notes (Signed)
Patient ID: Kristen Schroeder, female   DOB: 1941-09-09, 74 y.o.   MRN: [id number]      Visit Date: 06/15/2015  Today's Provider: Megan Mans, MD   Chief Complaint  Patient presents with  . Medicare Wellness   Subjective:    Annual wellness visit Kristen Schroeder is a 74 y.o. female. She feels fairly well. She reports exercising none. She reports she is sleeping well.  LAST: BMD-03/02/08-normal  COlonoscopy-04/06/08-repeat in 10 years.  EKG-06/25/14  Pap smear-06/03/2013-normal, had hysterectomy due to abnormal pap smears years ago-still has ovaries.  Mammogram-01/2015   Review of Systems  Constitutional: Positive for fatigue.  HENT: Positive for tinnitus.   Eyes: Positive for photophobia.  Respiratory: Positive for shortness of breath.   Cardiovascular: Positive for chest pain.  Gastrointestinal: Negative.   Endocrine: Positive for polydipsia.  Genitourinary: Positive for frequency.  Musculoskeletal: Positive for myalgias, back pain, arthralgias, gait problem, neck pain and neck stiffness.  Skin: Negative.   Neurological: Positive for weakness and numbness.  Hematological: Negative.   Psychiatric/Behavioral: The patient is nervous/anxious.     Social History   Social History  . Marital Status: Married    Spouse Name: N/A  . Number of Children: N/A  . Years of Education: N/A   Occupational History  . Not on file.   Social History Main Topics  . Smoking status: Former Smoker -- 0.50 packs/day for 15 years  . Smokeless tobacco: Never Used  . Alcohol Use: No  . Drug Use: No  . Sexual Activity: No   Other Topics Concern  . Not on file   Social History Narrative    Patient Active Problem List   Diagnosis Date Noted  . Nonspecific abnormal finding 05/11/2015  . Allergic rhinitis 05/11/2015  . Anxiety 05/11/2015  . Bronchitis, chronic 05/11/2015  . CCF (congestive cardiac failure) 05/11/2015  . Clinical depression 05/11/2015  . Diabetes mellitus, type 2  05/11/2015  . 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 Essential (primary) hypertension 05/11/2015  . Acid reflux 05/11/2015  . HLD (hyperlipidemia) 05/11/2015  . Adult hypothyroidism 05/11/2015  . Cervical dysplasia, mild 05/11/2015  . Neuropathy 05/11/2015  . Adiposity 05/11/2015  . Obstructive apnea 05/11/2015  . Arthritis, degenerative 05/11/2015    Past Surgical History  Procedure Laterality Date  . Abdominal hysterectomy    . Tubal ligation      Her family history includes Drug abuse in her other; Heart disease in her mother; Hypertension in her other.    Previous Medications   ALBUTEROL IN    Inhale into the lungs daily.   ALPRAZOLAM (XANAX) 0.5 MG TABLET    Take by mouth.   CETIRIZINE (ZYRTEC) 10 MG TABLET    Take 10 mg by mouth daily.   CYCLOBENZAPRINE (FLEXERIL) 10 MG TABLET    Take 10 mg by mouth daily.   DOXYCYCLINE (VIBRAMYCIN) 100 MG CAPSULE    TAKE ONE CAPSULE BY MOUTH TWICE A DAY   DULERA 100-5 MCG/ACT AERO       FLUTICASONE (FLONASE) 50 MCG/ACT NASAL SPRAY    Place into both nostrils daily.   FUROSEMIDE (LASIX) 20 MG TABLET    Take by mouth.   GABAPENTIN (NEURONTIN) 300 MG CAPSULE    Take by mouth.   HYDROCHLOROTHIAZIDE (HYDRODIURIL) 25 MG TABLET    Take by mouth.   HYDROCODONE-ACETAMINOPHEN (NORCO) 10-325 MG PER TABLET    Take 1-2 tablets by mouth every 4 (four) hours as needed.   IPRATROPIUM-ALBUTEROL (DUONEB)  0.5-2.5 (3) MG/3ML SOLN    Take 3 mLs by nebulization daily.   ISOSORBIDE MONONITRATE (IMDUR) 30 MG 24 HR TABLET    TAKE 1 TABLET (30 MG TOTAL) BY MOUTH ONCE DAILY.   LEVOTHYROXINE (SYNTHROID, LEVOTHROID) 125 MCG TABLET    Take by mouth.   LISINOPRIL (PRINIVIL,ZESTRIL) 10 MG TABLET    Take by mouth.   LORATADINE (CLARITIN) 10 MG TABLET    Take by mouth.   METFORMIN (GLUCOPHAGE) 1000 MG TABLET    Take by mouth.   MULTIPLE VITAMIN PO    Take by mouth.   OMEPRAZOLE 20 MG TBEC    Take by mouth.   PRAVASTATIN (PRAVACHOL) 10 MG TABLET     Take by mouth.   SAXAGLIPTIN HCL (ONGLYZA) 2.5 MG TABS TABLET    Take 1 tablet (2.5 mg total) by mouth daily.   SPIRIVA HANDIHALER 18 MCG INHALATION CAPSULE        Patient Care Team: Maple Hudson., MD as PCP - General (Family Medicine)     Objective:   Vitals: BP 128/62 mmHg  Pulse 84  Temp(Src) 98.4 F (36.9 C)  Resp 16  Ht 5' 4.25" (1.632 m)  Wt 237 lb (107.502 kg)  BMI 40.36 kg/m2  Physical Exam  Constitutional: She appears well-developed and well-nourished.  Obese  HENT:  Head: Normocephalic and atraumatic.  Right Ear: External ear normal.  Left Ear: External ear normal.  Nose: Nose normal.  Mouth/Throat: Oropharynx is clear and moist.  Eyes: Conjunctivae and EOM are normal. Pupils are equal, round, and reactive to light.  Neck: Normal range of motion. Neck supple.  Cardiovascular: Normal rate, regular rhythm and normal heart sounds.   Pulmonary/Chest: Effort normal.  Abdominal: Soft. Bowel sounds are normal.  Genitourinary: Guaiac negative stool.  DRE normal.  Skin: Skin is warm and dry.  Psychiatric: She has a normal mood and affect. Her behavior is normal. Judgment and thought content normal.    Activities of Daily Living In your present state of health, do you have any difficulty performing the following activities: 06/15/2015  Hearing? N  Vision? N  Difficulty concentrating or making decisions? N  Walking or climbing stairs? Y  Dressing or bathing? N  Doing errands, shopping? N    Fall Risk Assessment Fall Risk  06/15/2015  Falls in the past year? No     Depression Screen PHQ 2/9 Scores 06/15/2015  PHQ - 2 Score 2  PHQ- 9 Score 7    Cognitive Testing - 6-CIT  Correct? Score   What year is it? yes 0 0 or 4  What month is it? yes 0 0 or 3  Memorize:    Floyde Schroeder,  42,  [address],  [place],      What time is it? (within 1 hour) yes 0 0 or 3  Count backwards from 20 no 2 0, 2, or 4  Name the months of the year yes 0 0, 2, or 4  Repeat  name & address above no 4 0, 2, 4, 6, 8, or 10       TOTAL SCORE  6/28   Interpretation:  Normal  Normal (0-7) Abnormal (8-28)       Assessment & Plan:   1. Medicare annual wellness visit, subsequent Up to date on immunizations and mammogram.  2. Colon cancer screening     Immunization History  Administered Date(s) Administered  . Pneumococcal Conjugate-13 08/24/2014  . Pneumococcal Polysaccharide-23 11/03/1999, 07/23/2012  .  Tdap 07/23/2012  . Zoster 07/23/2012   I have done the exam and reviewed the above chart and it is accurate to the best of my knowledge.   Patient was seen and examined by Dr. Bosie Clos and note was scribed by Samara Deist, RMA.

## 2015-06-23 ENCOUNTER — Other Ambulatory Visit: Payer: Self-pay | Admitting: Family Medicine

## 2015-06-24 DIAGNOSIS — M160 Bilateral primary osteoarthritis of hip: Secondary | ICD-10-CM | POA: Diagnosis not present

## 2015-07-16 DIAGNOSIS — J449 Chronic obstructive pulmonary disease, unspecified: Secondary | ICD-10-CM | POA: Diagnosis not present

## 2015-07-20 ENCOUNTER — Ambulatory Visit (INDEPENDENT_AMBULATORY_CARE_PROVIDER_SITE_OTHER): Payer: Medicare Other | Admitting: Family Medicine

## 2015-07-20 ENCOUNTER — Telehealth: Payer: Self-pay

## 2015-07-20 ENCOUNTER — Encounter: Payer: Self-pay | Admitting: Family Medicine

## 2015-07-20 VITALS — BP 168/82 | HR 86 | Temp 98.0°F | Resp 20 | Wt 238.0 lb

## 2015-07-20 DIAGNOSIS — I10 Essential (primary) hypertension: Secondary | ICD-10-CM | POA: Diagnosis not present

## 2015-07-20 DIAGNOSIS — R0602 Shortness of breath: Secondary | ICD-10-CM | POA: Diagnosis not present

## 2015-07-20 NOTE — Telephone Encounter (Signed)
Verlon Au with Dr. Milta Deiters office called to report patient's BP was 180/90 while in the office. Verlon Au reports patient has not taking HCTZ today. Discussed with Dr. Sullivan Lone. Per Dr. Sullivan Lone patient can come in to be seen today if she is having symptoms, or wait until tomorrow if no symptoms and see if her blood pressure decreases after taking her HCTZ. Verlon Au advised patient. Patient states she would prefer coming in today due to having a headache.

## 2015-07-20 NOTE — Progress Notes (Signed)
Patient ID: Kristen Schroeder, female   DOB: May 24, 1941, 74 y.o.   MRN: [id number]        Patient: ROSSLYN Schroeder Female    DOB: [birth year]   74 y.o.   MRN: [id number] Visit Date: 07/20/2015  Today's Provider: Megan Mans, MD   Chief Complaint  Patient presents with  . Hypertension    BP has been elevated all day. Patient reports that she has not taken her HCTZ today.    Subjective:    Hypertension This is a chronic problem. The problem is uncontrolled. Pertinent negatives include no blurred vision, headaches, malaise/fatigue, neck pain, shortness of breath or sweats. There are no compliance problems.   Verlon Au with Dr. Milta Deiters office called to report patient's BP was 180/90 while in the office. Verlon Au reports patient has not taking HCTZ today. Patient reports that she woke up with a headache this morning, and she normally does not have a headache.      Allergies  Allergen Reactions  . Codeine Nausea And Vomiting and Nausea Only   Previous Medications   ALBUTEROL IN    Inhale into the lungs daily.   ALPRAZOLAM (XANAX) 0.5 MG TABLET    Take by mouth.   CETIRIZINE (ZYRTEC) 10 MG TABLET    Take 10 mg by mouth daily.   CYCLOBENZAPRINE (FLEXERIL) 10 MG TABLET    Take 10 mg by mouth daily.   DOXYCYCLINE (VIBRAMYCIN) 100 MG CAPSULE    TAKE ONE CAPSULE BY MOUTH TWICE A DAY   DULERA 100-5 MCG/ACT AERO       FLUTICASONE (FLONASE) 50 MCG/ACT NASAL SPRAY    Place into both nostrils daily.   FUROSEMIDE (LASIX) 20 MG TABLET    Take by mouth.   GABAPENTIN (NEURONTIN) 300 MG CAPSULE    Take by mouth.   HYDROCHLOROTHIAZIDE (HYDRODIURIL) 25 MG TABLET    Take by mouth.   HYDROCODONE-ACETAMINOPHEN (NORCO) 10-325 MG PER TABLET    Take 1-2 tablets by mouth every 4 (four) hours as needed.   IPRATROPIUM-ALBUTEROL (DUONEB) 0.5-2.5 (3) MG/3ML SOLN    Take 3 mLs by nebulization daily.   ISOSORBIDE MONONITRATE (IMDUR) 30 MG 24 HR TABLET    TAKE 1 TABLET (30 MG TOTAL) BY MOUTH ONCE DAILY.   LEVOTHYROXINE  (SYNTHROID, LEVOTHROID) 125 MCG TABLET    Take by mouth.   LISINOPRIL (PRINIVIL,ZESTRIL) 10 MG TABLET    Take by mouth.   LORATADINE (CLARITIN) 10 MG TABLET    Take by mouth.   METFORMIN (GLUCOPHAGE) 1000 MG TABLET    Take by mouth.   MULTIPLE VITAMIN PO    Take by mouth.   OMEPRAZOLE 20 MG TBEC    Take by mouth.   ONE TOUCH ULTRA TEST TEST STRIP    USE 1 STRIP 2 TIMES DAILY AND AS NEEDED   PRAVASTATIN (PRAVACHOL) 10 MG TABLET    Take by mouth.   SAXAGLIPTIN HCL (ONGLYZA) 2.5 MG TABS TABLET    Take 1 tablet (2.5 mg total) by mouth daily.   SPIRIVA HANDIHALER 18 MCG INHALATION CAPSULE        Review of Systems  Constitutional: Positive for fever. Negative for malaise/fatigue.  HENT: Negative.   Eyes: Negative.  Negative for blurred vision.  Respiratory: Negative for shortness of breath.   Cardiovascular: Negative.   Gastrointestinal: Negative.   Endocrine: Negative.   Musculoskeletal: Negative for neck pain.  Skin: Negative.   Neurological: Negative for headaches.  Psychiatric/Behavioral: Negative.     Social History  Substance Use Topics  . Smoking status: Former Smoker -- 0.50 packs/day for 15 years  . Smokeless tobacco: Never Used  . Alcohol Use: No   Objective:   BP 168/82 mmHg  Pulse 86  Resp 20  SpO2 95%  Physical Exam  Constitutional: She is oriented to person, place, and time. She appears well-developed and well-nourished.  Obese white female in no acute distress  HENT:  Head: Normocephalic and atraumatic.  Right Ear: External ear normal.  Left Ear: External ear normal.  Nose: Nose normal.  Eyes: Conjunctivae are normal.  Neck: Neck supple.  Cardiovascular: Normal rate, regular rhythm and normal heart sounds.   Pulmonary/Chest: Effort normal and breath sounds normal.  Abdominal: Soft.  Neurological: She is alert and oriented to person, place, and time.  Skin: Skin is warm and dry.  Psychiatric: She has a normal mood and affect. Her behavior is normal.  Judgment and thought content normal.        Assessment & Plan:     Hypertension Go back on HCTZ  Now. Check BPs at home. May need to add another med. Obesity COPD       Megan Mans, MD  [facility]

## 2015-07-20 NOTE — Telephone Encounter (Signed)
Pt seen today-aa

## 2015-07-21 ENCOUNTER — Telehealth: Payer: Self-pay | Admitting: Family Medicine

## 2015-07-21 NOTE — Telephone Encounter (Signed)
Pt stat Dr Sullivan Lone told her to call this morning with her blood pressure reading.  Pt states at 10:15am her blood pressure was 159/79 and at 11:00 it was 162/91.  [id number]#[phone]/[id number]

## 2015-07-21 NOTE — Telephone Encounter (Signed)
See below-aa 

## 2015-07-22 NOTE — Telephone Encounter (Signed)
Improving. Thank you

## 2015-07-25 NOTE — Telephone Encounter (Signed)
Advised  ED 

## 2015-07-27 DIAGNOSIS — J8410 Pulmonary fibrosis, unspecified: Secondary | ICD-10-CM | POA: Diagnosis not present

## 2015-07-27 DIAGNOSIS — I10 Essential (primary) hypertension: Secondary | ICD-10-CM | POA: Diagnosis not present

## 2015-07-27 DIAGNOSIS — E784 Other hyperlipidemia: Secondary | ICD-10-CM | POA: Diagnosis not present

## 2015-07-27 DIAGNOSIS — E1165 Type 2 diabetes mellitus with hyperglycemia: Secondary | ICD-10-CM | POA: Diagnosis not present

## 2015-07-27 DIAGNOSIS — R0902 Hypoxemia: Secondary | ICD-10-CM | POA: Diagnosis not present

## 2015-07-28 DIAGNOSIS — J9611 Chronic respiratory failure with hypoxia: Secondary | ICD-10-CM | POA: Diagnosis not present

## 2015-07-28 DIAGNOSIS — Z23 Encounter for immunization: Secondary | ICD-10-CM | POA: Diagnosis not present

## 2015-07-28 DIAGNOSIS — R0602 Shortness of breath: Secondary | ICD-10-CM | POA: Diagnosis not present

## 2015-07-28 DIAGNOSIS — J449 Chronic obstructive pulmonary disease, unspecified: Secondary | ICD-10-CM | POA: Diagnosis not present

## 2015-08-11 ENCOUNTER — Other Ambulatory Visit: Payer: Self-pay | Admitting: Family Medicine

## 2015-08-15 DIAGNOSIS — J449 Chronic obstructive pulmonary disease, unspecified: Secondary | ICD-10-CM | POA: Diagnosis not present

## 2015-08-18 ENCOUNTER — Ambulatory Visit (INDEPENDENT_AMBULATORY_CARE_PROVIDER_SITE_OTHER): Payer: Medicare Other | Admitting: Family Medicine

## 2015-08-18 ENCOUNTER — Encounter: Payer: Self-pay | Admitting: Family Medicine

## 2015-08-18 VITALS — BP 142/70 | HR 100 | Temp 98.3°F | Resp 20 | Ht 64.0 in | Wt 242.0 lb

## 2015-08-18 DIAGNOSIS — Z23 Encounter for immunization: Secondary | ICD-10-CM | POA: Diagnosis not present

## 2015-08-18 NOTE — Progress Notes (Signed)
Patient ID: Kristen Schroeder, female   DOB: May 25, 1941, 74 y.o.   MRN: [id number]       Patient: Kristen Schroeder Female    DOB: Sep 24, 1941   74 y.o.   MRN: [id number] Visit Date: 08/18/2015  Today's Provider: Megan Mans, MD   Chief Complaint  Patient presents with  . Diabetes  . Hypothyroidism  . Hypertension   Subjective:    Diabetes She presents for her follow-up diabetic visit. She has type 2 diabetes mellitus. Her disease course has been stable. There are no hypoglycemic associated symptoms. Pertinent negatives for hypoglycemia include no headaches. There are no diabetic associated symptoms. Pertinent negatives for diabetes include no chest pain. There are no hypoglycemic complications. Symptoms are stable. There are no diabetic complications. She is compliant with treatment most of the time. Her weight is stable. She sees a podiatrist.Eye exam is current.  Hypertension This is a chronic problem. The problem is unchanged. The problem is controlled. Associated symptoms include shortness of breath. Pertinent negatives include no chest pain, headaches, neck pain, palpitations or peripheral edema. There are no compliance problems.  Hypertensive end-organ damage includes a thyroid problem.  Thyroid Problem Presents for follow-up visit. Patient reports no palpitations. The symptoms have been stable.  Patient reports that she checks her BP occasionally. She reports that she has been taking HCTZ on a daily basis since last month.      Allergies  Allergen Reactions  . Codeine Nausea And Vomiting and Nausea Only   Previous Medications   ALBUTEROL IN    Inhale into the lungs daily.   ALPRAZOLAM (XANAX) 0.5 MG TABLET    Take by mouth.   CETIRIZINE (ZYRTEC) 10 MG TABLET    Take 10 mg by mouth daily.   CYCLOBENZAPRINE (FLEXERIL) 10 MG TABLET    Take 10 mg by mouth daily.   DOXYCYCLINE (VIBRAMYCIN) 100 MG CAPSULE    TAKE ONE CAPSULE BY MOUTH TWICE A DAY   DULERA 100-5 MCG/ACT AERO         FLUTICASONE (FLONASE) 50 MCG/ACT NASAL SPRAY    Place into both nostrils daily.   FUROSEMIDE (LASIX) 20 MG TABLET    Take by mouth.   GABAPENTIN (NEURONTIN) 300 MG CAPSULE    TAKE 2 CAPSULES BY MOUTH 2 TIME A DAY   HYDROCHLOROTHIAZIDE (HYDRODIURIL) 25 MG TABLET    Take by mouth.   HYDROCODONE-ACETAMINOPHEN (NORCO) 10-325 MG PER TABLET    Take 1-2 tablets by mouth every 4 (four) hours as needed.   IPRATROPIUM-ALBUTEROL (DUONEB) 0.5-2.5 (3) MG/3ML SOLN    Take 3 mLs by nebulization daily.   ISOSORBIDE MONONITRATE (IMDUR) 30 MG 24 HR TABLET    TAKE 1 TABLET (30 MG TOTAL) BY MOUTH ONCE DAILY.   LEVOTHYROXINE (SYNTHROID, LEVOTHROID) 125 MCG TABLET    Take by mouth.   LISINOPRIL (PRINIVIL,ZESTRIL) 10 MG TABLET    Take by mouth.   LORATADINE (CLARITIN) 10 MG TABLET    Take by mouth.   METFORMIN (GLUCOPHAGE) 1000 MG TABLET    Take by mouth.   MULTIPLE VITAMIN PO    Take by mouth.   OMEPRAZOLE 20 MG TBEC    Take by mouth.   ONE TOUCH ULTRA TEST TEST STRIP    USE 1 STRIP 2 TIMES DAILY AND AS NEEDED   PRAVASTATIN (PRAVACHOL) 10 MG TABLET    Take by mouth.   SAXAGLIPTIN HCL (ONGLYZA) 2.5 MG TABS TABLET    Take 1 tablet (2.5 mg total) by  mouth daily.   SPIRIVA HANDIHALER 18 MCG INHALATION CAPSULE        Review of Systems  Constitutional: Negative.   Respiratory: Positive for shortness of breath.        Patient has COPD.   Cardiovascular: Negative.  Negative for chest pain and palpitations.  Musculoskeletal: Negative.  Negative for neck pain.  Neurological: Negative.  Negative for headaches.  Hematological: Negative.   Psychiatric/Behavioral: Negative.     Social History  Substance Use Topics  . Smoking status: Former Smoker -- 0.50 packs/day for 15 years  . Smokeless tobacco: Never Used  . Alcohol Use: No   Objective:   Pulse 100  SpO2 95%  Physical Exam  Constitutional: She is oriented to person, place, and time. She appears well-developed and well-nourished.  HENT:  Head:  Normocephalic and atraumatic.  Right Ear: External ear normal.  Left Ear: External ear normal.  Nose: Nose normal.  Neck: Neck supple.  Cardiovascular: Normal rate, regular rhythm and normal heart sounds.   Pulmonary/Chest: Effort normal and breath sounds normal.  Abdominal: Soft.  Neurological: She is alert and oriented to person, place, and time.  Skin: Skin is warm and dry.  Psychiatric: She has a normal mood and affect. Her behavior is normal. Judgment and thought content normal.        Assessment & Plan:     1. Need for influenza vaccination  - Flu vaccine HIGH DOSE PF 2. COPD Patient with chronic DOE and fatigue most likely related all these problems. Nothing new. 3. Obesity 4. Hyperlipidemia 5. Type 2 diabetes mellitus Last A1c was 8.3  2 months ago      Megan Mansichard Gilbert Jr, MD  [facility]

## 2015-08-19 ENCOUNTER — Other Ambulatory Visit: Payer: Self-pay | Admitting: Family Medicine

## 2015-08-19 DIAGNOSIS — M5136 Other intervertebral disc degeneration, lumbar region: Secondary | ICD-10-CM

## 2015-08-19 DIAGNOSIS — M5137 Other intervertebral disc degeneration, lumbosacral region: Secondary | ICD-10-CM

## 2015-08-19 NOTE — Telephone Encounter (Signed)
This should be filled by Dr.Gilbert as it is for a chronic large amount

## 2015-08-19 NOTE — Telephone Encounter (Signed)
Dr. Reece AgarG, can you please refill med? Thanks!

## 2015-08-19 NOTE — Telephone Encounter (Signed)
Ok to refill med? Patient was seen in the office yesterday. Normally a Sullivan LoneGilbert patient.

## 2015-08-19 NOTE — Telephone Encounter (Signed)
Pt contacted office for refill request on the following medications:  HYDROcodone-acetaminophen (NORCO) 10-325 MG.  CB#[id number]/MW ° °

## 2015-08-21 MED ORDER — HYDROCHLOROTHIAZIDE 25 MG PO TABS: 25.0000 mg | ORAL_TABLET | Freq: Every day | ORAL | Status: DC

## 2015-08-21 MED ORDER — PRAVASTATIN SODIUM 10 MG PO TABS: 10.0000 mg | ORAL_TABLET | Freq: Every day | ORAL | Status: DC

## 2015-08-21 NOTE — Telephone Encounter (Signed)
Okay to refill. I do not rememberif  this is 180 or 150 tablets.

## 2015-08-22 MED ORDER — HYDROCODONE-ACETAMINOPHEN 10-325 MG PO TABS: 1.0000 | ORAL_TABLET | ORAL | Status: DC | PRN

## 2015-08-22 NOTE — Telephone Encounter (Signed)
Pt informed, rx ready to pick up.

## 2015-08-24 ENCOUNTER — Telehealth: Payer: Self-pay | Admitting: Family Medicine

## 2015-08-24 NOTE — Telephone Encounter (Signed)
Spoke with pt. According to Dr. Elisabeth CaraGilbert's note from 07/2015 she was to restart the HCTZ. Explained this to pt and she verbalized understanding.

## 2015-08-24 NOTE — Telephone Encounter (Signed)
Pt called wanting to know about her fluid pill.  She said she was taking one and when she went to get her refill it was a different pill.  Please advise.  Call back is  [phone]  Thanks Barth Kirkseri

## 2015-09-05 ENCOUNTER — Other Ambulatory Visit: Payer: Self-pay | Admitting: Family Medicine

## 2015-09-06 ENCOUNTER — Telehealth: Payer: Self-pay | Admitting: Emergency Medicine

## 2015-09-06 MED ORDER — DOXYCYCLINE HYCLATE 100 MG PO CAPS: 100.0000 mg | ORAL_CAPSULE | Freq: Two times a day (BID) | ORAL | Status: DC

## 2015-09-06 NOTE — Telephone Encounter (Signed)
Pt called and wanted to know if she could get an abx called in or if the Doxy that was sent yesterday is ok to take. She started having cold symptoms yesterday and she feels like it is going down into her chest today. I explained that its probably too early for an abx since it just started, and you may want to see her. But she wanted me to ask if she could have a zpak or if you thought ti Doxy would cover her?    [facility] [place].

## 2015-09-06 NOTE — Telephone Encounter (Signed)
Pt advised-aa 

## 2015-09-06 NOTE — Telephone Encounter (Signed)
Go ahead with Doxy.

## 2015-09-13 ENCOUNTER — Telehealth: Payer: Self-pay | Admitting: Family Medicine

## 2015-09-13 NOTE — Telephone Encounter (Signed)
Pt stated she was supposed to call back and speak with a nurse about how she was doing with the medication. Thanks TNP

## 2015-09-13 NOTE — Telephone Encounter (Signed)
Spoke with patient, she finished Doxy this morning and this was the second round. She felt like she has improved. She did take a little of Robitussin in between. She continues to have cough-productive some-light yellow color, wheezing especially with laying down in the evening, post nasal drainage present. No fever no chills no body aches. She has used inhaler that she has. She saw pulmonologist for follow up 1 month ago. These current symptoms started shortly after flu shot with and not sure what to do at this time. Please review-aa

## 2015-09-15 DIAGNOSIS — J449 Chronic obstructive pulmonary disease, unspecified: Secondary | ICD-10-CM | POA: Diagnosis not present

## 2015-10-15 DIAGNOSIS — J449 Chronic obstructive pulmonary disease, unspecified: Secondary | ICD-10-CM | POA: Diagnosis not present

## 2015-10-31 ENCOUNTER — Other Ambulatory Visit: Payer: Self-pay | Admitting: Family Medicine

## 2015-11-09 ENCOUNTER — Other Ambulatory Visit: Payer: Self-pay | Admitting: Emergency Medicine

## 2015-11-09 DIAGNOSIS — J441 Chronic obstructive pulmonary disease with (acute) exacerbation: Secondary | ICD-10-CM

## 2015-11-09 MED ORDER — AZITHROMYCIN 250 MG PO TABS: ORAL_TABLET | ORAL | Status: DC

## 2015-11-15 DIAGNOSIS — J449 Chronic obstructive pulmonary disease, unspecified: Secondary | ICD-10-CM | POA: Diagnosis not present

## 2015-12-09 ENCOUNTER — Other Ambulatory Visit: Payer: Self-pay | Admitting: Family Medicine

## 2015-12-09 DIAGNOSIS — M5136 Other intervertebral disc degeneration, lumbar region: Secondary | ICD-10-CM

## 2015-12-09 MED ORDER — HYDROCODONE-ACETAMINOPHEN 10-325 MG PO TABS: 1.0000 | ORAL_TABLET | ORAL | Status: DC | PRN

## 2015-12-09 NOTE — Telephone Encounter (Signed)
Pt advised.   Thanks,   -Shree Espey  

## 2015-12-09 NOTE — Telephone Encounter (Signed)
Last refill 08/22/2015. # 200 with no refills. Please advise. Allene Dillon, CMA

## 2015-12-09 NOTE — Telephone Encounter (Signed)
Printed.  Please notify patient. Thanks.  

## 2015-12-09 NOTE — Telephone Encounter (Signed)
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 I advised pt that Dr. Sullivan Lone is out of the office today and refill request can take 24 to 48 hours. Pt requested to see if someone else could write it. Please advise. Thanks TNP

## 2015-12-16 DIAGNOSIS — J449 Chronic obstructive pulmonary disease, unspecified: Secondary | ICD-10-CM | POA: Diagnosis not present

## 2015-12-21 ENCOUNTER — Ambulatory Visit (INDEPENDENT_AMBULATORY_CARE_PROVIDER_SITE_OTHER): Payer: Medicare Other | Admitting: Family Medicine

## 2015-12-21 ENCOUNTER — Encounter: Payer: Self-pay | Admitting: Family Medicine

## 2015-12-21 VITALS — BP 90/60 | HR 84 | Temp 98.0°F | Resp 18 | Wt 241.0 lb

## 2015-12-21 DIAGNOSIS — R739 Hyperglycemia, unspecified: Secondary | ICD-10-CM | POA: Diagnosis not present

## 2015-12-21 DIAGNOSIS — I10 Essential (primary) hypertension: Secondary | ICD-10-CM

## 2015-12-21 DIAGNOSIS — M5136 Other intervertebral disc degeneration, lumbar region: Secondary | ICD-10-CM

## 2015-12-21 DIAGNOSIS — E038 Other specified hypothyroidism: Secondary | ICD-10-CM

## 2015-12-21 DIAGNOSIS — F32A Depression, unspecified: Secondary | ICD-10-CM

## 2015-12-21 DIAGNOSIS — E785 Hyperlipidemia, unspecified: Secondary | ICD-10-CM

## 2015-12-21 DIAGNOSIS — F329 Major depressive disorder, single episode, unspecified: Secondary | ICD-10-CM | POA: Diagnosis not present

## 2015-12-21 LAB — POCT GLYCOSYLATED HEMOGLOBIN (HGB A1C): Hemoglobin A1C: 5.7

## 2015-12-21 NOTE — Patient Instructions (Signed)
Stop Hydrochlorothiazide. Continue Lisinopril daily. Continue checking B/P at home.

## 2015-12-21 NOTE — Progress Notes (Signed)
Patient ID: Kristen Schroeder, female   DOB: 1941/03/18, 75 y.o.   MRN: [id number]    Subjective:  HPI  Patient is here for 4 months follow up.  Diabetes: Patient is checking her sugar in the morning readings around 130s-150s and in the evening around 130s. In August due to her A1C level we added ONglyza 2.5 mg and gave her samples. She states she took it but a RX was not sent in so she has been only on Metformin right now. She states when she took additional medication levels improved after second week of taking medication but levels have been better even without taking Onglyza now. Lab Results  Component Value Date   HGBA1C 8.3 06/10/2015    Hyperlipidemia: Patient takes Pravastatin. Lab Results  Component Value Date   CHOL 174 06/10/2015   HDL 53 06/10/2015   LDLCALC 91 06/10/2015   TRIG 150* 06/10/2015   CHOLHDL 3.3 06/10/2015  Blood pressure low today. Overall patient feels fairly well. No symptomatic orthostasis.   Prior to Admission medications   Medication Sig Start Date End Date Taking? Authorizing Provider  ALBUTEROL IN Inhale into the lungs daily.   Yes Historical Provider, MD  ALPRAZolam Prudy Feeler) 0.5 MG tablet Take by mouth. 12/26/14  Yes Historical Provider, MD  DULERA 100-5 MCG/ACT AERO Inhale 2 puffs into the lungs 2 (two) times daily.  09/06/14  Yes Historical Provider, MD  fluticasone (FLONASE) 50 MCG/ACT nasal spray Place into both nostrils daily.   Yes Historical Provider, MD  furosemide (LASIX) 20 MG tablet Take by mouth. 12/20/14  Yes Historical Provider, MD  gabapentin (NEURONTIN) 300 MG capsule TAKE 2 CAPSULES BY MOUTH 2 TIME A DAY 08/11/15  Yes Richard Hulen Shouts., MD  hydrochlorothiazide (HYDRODIURIL) 25 MG tablet Take 1 tablet (25 mg total) by mouth daily. 08/21/15  Yes Richard Hulen Shouts., MD  HYDROcodone-acetaminophen [facility]) 10-325 MG tablet Take 1-2 tablets by mouth every 4 (four) hours as needed. 12/09/15  Yes Lorie Phenix, MD  ipratropium-albuterol  (DUONEB) 0.5-2.5 (3) MG/3ML SOLN Take 3 mLs by nebulization daily.   Yes Historical Provider, MD  isosorbide mononitrate (IMDUR) 30 MG 24 hr tablet TAKE 1 TABLET (30 MG TOTAL) BY MOUTH ONCE DAILY. 06/13/15  Yes Historical Provider, MD  levothyroxine (SYNTHROID, LEVOTHROID) 125 MCG tablet Take by mouth. 03/15/15  Yes Historical Provider, MD  lisinopril (PRINIVIL,ZESTRIL) 10 MG tablet Take by mouth. 03/24/15  Yes Historical Provider, MD  loratadine (CLARITIN) 10 MG tablet TAKE 1 TABLET BY MOUTH DAILY 11/01/15  Yes Maple Hudson., MD  metFORMIN (GLUCOPHAGE) 1000 MG tablet Take by mouth. 11/16/14  Yes Historical Provider, MD  MULTIPLE VITAMIN PO Take by mouth.   Yes Historical Provider, MD  Omeprazole 20 MG TBEC Take by mouth. 12/08/13  Yes Historical Provider, MD  ONE TOUCH ULTRA TEST test strip USE 1 STRIP 2 TIMES DAILY AND AS NEEDED 06/23/15  Yes Richard Hulen Shouts., MD  pravastatin (PRAVACHOL) 10 MG tablet Take 1 tablet (10 mg total) by mouth daily. 08/21/15  Yes Richard Hulen Shouts., MD  SPIRIVA HANDIHALER 18 MCG inhalation capsule  09/06/14  Yes Historical Provider, MD    Patient Active Problem List   Diagnosis Date Noted  . Nonspecific abnormal finding 05/11/2015  . Allergic rhinitis 05/11/2015  . Anxiety 05/11/2015  . Bronchitis, chronic (HCC) 05/11/2015  . CCF (congestive cardiac failure) (HCC) 05/11/2015  . Clinical depression 05/11/2015  . Diabetes mellitus, type 2 (HCC) 05/11/2015  . DDD (  degenerative disc disease), 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/11/2015  . Essential (primary) hypertension 05/11/2015  . Acid reflux 05/11/2015  . HLD (hyperlipidemia) 05/11/2015  . Adult hypothyroidism 05/11/2015  . Cervical dysplasia, mild 05/11/2015  . Neuropathy (HCC) 05/11/2015  . Adiposity 05/11/2015  . Obstructive apnea 05/11/2015  . Arthritis, degenerative 05/11/2015    No past medical history on file.  Social History   Social History  . Marital Status: Married    Spouse Name: N/A  . Number of  Children: N/A  . Years of Education: N/A   Occupational History  . Not on file.   Social History Main Topics  . Smoking status: Former Smoker -- 0.50 packs/day for 15 years  . Smokeless tobacco: Never Used  . Alcohol Use: No  . Drug Use: No  . Sexual Activity: No   Other Topics Concern  . Not on file   Social History Narrative    Allergies  Allergen Reactions  . Codeine Nausea And Vomiting and Nausea Only    Review of Systems  Constitutional: Positive for malaise/fatigue. Negative for fever and chills.  Respiratory: Positive for cough and shortness of breath. Negative for wheezing.   Cardiovascular: Negative.   Musculoskeletal: Positive for myalgias and joint pain.  Neurological: Negative for dizziness, tingling and tremors.    Immunization History  Administered Date(s) Administered  . Influenza, High Dose Seasonal PF 08/18/2015  . Pneumococcal Conjugate-13 08/24/2014  . Pneumococcal Polysaccharide-23 11/03/1999, 07/23/2012  . Tdap 07/23/2012  . Zoster 07/23/2012   Objective:  BP 98/52 mmHg  Pulse 84  Temp(Src) 98 F (36.7 C)  Resp 18  Wt 241 lb (109.317 kg)  SpO2 97%  Physical Exam  Constitutional: She is oriented to person, place, and time and well-developed, well-nourished, and in no distress.  HENT:  Head: Normocephalic and atraumatic.  Eyes: Conjunctivae are normal. Pupils are equal, round, and reactive to light.  Neck: Normal range of motion. Neck supple.  Cardiovascular: Normal rate, regular rhythm, normal heart sounds and intact distal pulses.   No murmur heard. Pulmonary/Chest: Effort normal and breath sounds normal. No respiratory distress. She has no wheezes.  Abdominal: Soft. She exhibits no distension. There is no tenderness. There is no rebound.  Musculoskeletal: She exhibits no edema or tenderness.  Neurological: She is alert and oriented to person, place, and time.  Psychiatric: Mood, memory, affect and judgment normal.    Lab Results    Component Value Date   WBC 8.5 06/10/2015   HGB 11.3* 07/20/2014   HCT 37.0 06/10/2015   PLT 247 06/10/2015   GLUCOSE 263* 06/10/2015   CHOL 174 06/10/2015   TRIG 150* 06/10/2015   HDL 53 06/10/2015   LDLCALC 91 06/10/2015   TSH 2.51 03/17/2015   HGBA1C 8.3 06/10/2015    CMP     Component Value Date/Time   NA 141 06/10/2015 1636   K 4.3 06/10/2015 1636   CL 97 06/10/2015 1636   CO2 31* 06/10/2015 1636   GLUCOSE 263* 06/10/2015 1636   BUN 8 06/10/2015 1636   CREATININE 0.61 06/10/2015 1636   CREATININE 0.7 03/17/2015   CALCIUM 9.6 06/10/2015 1636   PROT 6.2 06/10/2015 1636   ALBUMIN 3.9 06/10/2015 1636   AST 11 06/10/2015 1636   ALT 6 06/10/2015 1636   ALKPHOS 83 06/10/2015 1636   BILITOT 0.3 06/10/2015 1636   GFRNONAA 90 06/10/2015 1636   GFRAA 104 06/10/2015 1636    Assessment and Plan :  1. Essential hypertension On the low side. Will  stop HCTZ and continue Lisinopril. Will re check on the next visit. Patient asymptomatic today. Encourage patient to drink more water during the day. 2. Hyperglycemia A1C 5.7 today. Much better. Continue current regimen. - POCT HgB A1C-5.7  3. DDD (degenerative disc disease), lumbar  4. HLD (hyperlipidemia)  5. Clinical depression  6. Other specified hypothyroidism 7. COPD 8. Morbid obesity I have done the exam and reviewed the above chart and it is accurate to the best of my knowledge.   Julieanne Manson MD [facility] 12/21/2015 11:44 AM

## 2016-01-12 ENCOUNTER — Other Ambulatory Visit: Payer: Self-pay | Admitting: Family Medicine

## 2016-01-13 DIAGNOSIS — J449 Chronic obstructive pulmonary disease, unspecified: Secondary | ICD-10-CM | POA: Diagnosis not present

## 2016-01-23 DIAGNOSIS — J8410 Pulmonary fibrosis, unspecified: Secondary | ICD-10-CM | POA: Diagnosis not present

## 2016-01-23 DIAGNOSIS — I10 Essential (primary) hypertension: Secondary | ICD-10-CM | POA: Diagnosis not present

## 2016-01-23 DIAGNOSIS — E1165 Type 2 diabetes mellitus with hyperglycemia: Secondary | ICD-10-CM | POA: Diagnosis not present

## 2016-01-23 DIAGNOSIS — R0902 Hypoxemia: Secondary | ICD-10-CM | POA: Diagnosis not present

## 2016-01-23 DIAGNOSIS — E784 Other hyperlipidemia: Secondary | ICD-10-CM | POA: Diagnosis not present

## 2016-01-26 DIAGNOSIS — J9611 Chronic respiratory failure with hypoxia: Secondary | ICD-10-CM | POA: Diagnosis not present

## 2016-01-26 DIAGNOSIS — J441 Chronic obstructive pulmonary disease with (acute) exacerbation: Secondary | ICD-10-CM | POA: Diagnosis not present

## 2016-01-26 DIAGNOSIS — R0602 Shortness of breath: Secondary | ICD-10-CM | POA: Diagnosis not present

## 2016-02-01 ENCOUNTER — Ambulatory Visit (INDEPENDENT_AMBULATORY_CARE_PROVIDER_SITE_OTHER): Payer: Medicare Other | Admitting: Family Medicine

## 2016-02-01 ENCOUNTER — Encounter: Payer: Self-pay | Admitting: Family Medicine

## 2016-02-01 VITALS — BP 116/64 | HR 100 | Temp 98.1°F | Resp 20 | Wt 247.0 lb

## 2016-02-01 DIAGNOSIS — M5136 Other intervertebral disc degeneration, lumbar region: Secondary | ICD-10-CM | POA: Diagnosis not present

## 2016-02-01 DIAGNOSIS — I10 Essential (primary) hypertension: Secondary | ICD-10-CM | POA: Diagnosis not present

## 2016-02-01 MED ORDER — HYDROCODONE-ACETAMINOPHEN 10-325 MG PO TABS: 1.0000 | ORAL_TABLET | ORAL | Status: DC | PRN

## 2016-02-01 NOTE — Progress Notes (Signed)
Subjective:    Patient ID: Kristen Schroeder, female    DOB: [birth year], 75 y.o.   MRN: [id number]  Hypertension This is a chronic problem. Episode onset: LOV was 12/21/2015. BP was 90/60. D/C HCTZ at LOV. The problem is controlled. Associated symptoms include malaise/fatigue, shortness of breath (worsening due to pollen) and sweats (with weather change). Pertinent negatives include no anxiety, blurred vision, chest pain, headaches, neck pain, orthopnea, palpitations or peripheral edema. Risk factors for coronary artery disease include diabetes mellitus, dyslipidemia, obesity, post-menopausal state and family history. Treatments tried: Currently taking Lisinopril 10 mg. The current treatment provides significant improvement. There are no compliance problems.    Back Pain Pt needs a refill on Norco today. Last refill was 12/09/2015.   Review of Systems  Constitutional: Positive for malaise/fatigue.  Eyes: Negative.  Negative for blurred vision.  Respiratory: Positive for shortness of breath (worsening due to pollen).   Cardiovascular: Negative for chest pain, palpitations and orthopnea.  Endocrine: Negative.   Musculoskeletal: Negative for neck pain.  Allergic/Immunologic: Negative.   Neurological: Negative for headaches.  Hematological: Negative.   Psychiatric/Behavioral: Negative.    BP 116/64 mmHg  Pulse 100  Temp(Src) 98.1 F (36.7 C) (Oral)  Resp 20  Wt 247 lb (112.038 kg)   Patient Active Problem List   Diagnosis Date Noted  . Nonspecific abnormal finding 05/11/2015  . Allergic rhinitis 05/11/2015  . Anxiety 05/11/2015  . Bronchitis, chronic (HCC) 05/11/2015  . CCF (congestive cardiac failure) (HCC) 05/11/2015  . Clinical depression 05/11/2015  . Diabetes mellitus, type 2 (HCC) 05/11/2015  . 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 Essential (primary) hypertension 05/11/2015  . Acid reflux 05/11/2015  . HLD (hyperlipidemia) 05/11/2015  . Adult  hypothyroidism 05/11/2015  . Cervical dysplasia, mild 05/11/2015  . Neuropathy (HCC) 05/11/2015  . Adiposity 05/11/2015  . Obstructive apnea 05/11/2015  . Arthritis, degenerative 05/11/2015   No past medical history on file. Current Outpatient Prescriptions on File Prior to Visit  Medication Sig  . ALBUTEROL IN Inhale into the lungs daily.  Marland Kitchen ALPRAZolam (XANAX) 0.5 MG tablet Take by mouth.  . DULERA 100-5 MCG/ACT AERO Inhale 2 puffs into the lungs 2 (two) times daily.   . fluticasone (FLONASE) 50 MCG/ACT nasal spray Place into both nostrils daily.  . furosemide (LASIX) 20 MG tablet Take by mouth.  . gabapentin (NEURONTIN) 300 MG capsule TAKE 2 CAPSULES BY MOUTH 2 TIME A DAY  . HYDROcodone-acetaminophen (NORCO) 10-325 MG tablet Take 1-2 tablets by mouth every 4 (four) hours as needed.  Marland Kitchen ipratropium-albuterol (DUONEB) 0.5-2.5 (3) MG/3ML SOLN Take 3 mLs by nebulization daily.  . isosorbide mononitrate (IMDUR) 30 MG 24 hr tablet TAKE 1 TABLET (30 MG TOTAL) BY MOUTH ONCE DAILY.  Marland Kitchen levothyroxine (SYNTHROID, LEVOTHROID) 125 MCG tablet TAKE 1 TABLET BY MOUTH DAILY  . lisinopril (PRINIVIL,ZESTRIL) 10 MG tablet Take by mouth.  . loratadine (CLARITIN) 10 MG tablet TAKE 1 TABLET BY MOUTH DAILY  . metFORMIN (GLUCOPHAGE) 1000 MG tablet Take by mouth.  . MULTIPLE VITAMIN PO Take by mouth.  . Omeprazole 20 MG TBEC Take by mouth.  . ONE TOUCH ULTRA TEST test strip USE 1 STRIP 2 TIMES DAILY AND AS NEEDED  . pravastatin (PRAVACHOL) 10 MG tablet Take 1 tablet (10 mg total) by mouth daily.  Marland Kitchen SPIRIVA HANDIHALER 18 MCG inhalation capsule    No current facility-administered medications on file prior to visit.   Allergies  Allergen Reactions  . Codeine Nausea And  Vomiting and Nausea Only   Past Surgical History  Procedure Laterality Date  . Abdominal hysterectomy    . Tubal ligation     Social History   Social History  . Marital Status: Married    Spouse Name: N/A  . Number of Children: N/A  .  Years of Education: N/A   Occupational History  . Not on file.   Social History Main Topics  . Smoking status: Former Smoker -- 0.50 packs/day for 15 years  . Smokeless tobacco: Never Used  . Alcohol Use: No  . Drug Use: No  . Sexual Activity: No   Other Topics Concern  . Not on file   Social History Narrative   Family History  Problem Relation Age of Onset  . Heart disease Mother   . Drug abuse Other   . Hypertension Other       Objective:   Physical Exam  Constitutional: She is oriented to person, place, and time. She appears well-developed and well-nourished.  HENT:  Head: Normocephalic and atraumatic.  Right Ear: External ear normal.  Left Ear: External ear normal.  Nose: Nose normal.  Eyes: Conjunctivae are normal.  Neck: Neck supple.  Cardiovascular: Normal rate, regular rhythm and normal heart sounds.   Pulmonary/Chest: Effort normal and breath sounds normal. No respiratory distress.  Abdominal: Soft.  Musculoskeletal: She exhibits no edema.  Neurological: She is alert and oriented to person, place, and time. She displays normal reflexes. No cranial nerve deficit. She exhibits normal muscle tone. Coordination normal.  Skin: Skin is warm.  Psychiatric: She has a normal mood and affect. Her behavior is normal. Judgment and thought content normal.        Assessment & Plan:  1. DDD (degenerative disc disease), lumbar Stable. Refill as below. - HYDROcodone-acetaminophen (NORCO) 10-325 MG tablet; Take 1-2 tablets by mouth every 4 (four) hours as needed.  Dispense: 200 tablet; Refill: 0 Chronic condition. If it worsens may need referral for further evaluation. 2. Essential (primary) hypertension Stable/improving. Continue current medications and plan of care. 3. Obesity 4. COPD 5. Well controlled diabetes mellitus I have done the exam and reviewed the above chart and it is accurate to the best of my knowledge.  Patient seen and examined by Julieanne Mansonichard Gustav Knueppel, MD,  and note scribed by Allene DillonEmily Drozdowski, CMA.

## 2016-02-06 ENCOUNTER — Other Ambulatory Visit: Payer: Self-pay | Admitting: Family Medicine

## 2016-02-13 DIAGNOSIS — J449 Chronic obstructive pulmonary disease, unspecified: Secondary | ICD-10-CM | POA: Diagnosis not present

## 2016-02-24 ENCOUNTER — Other Ambulatory Visit: Payer: Self-pay

## 2016-02-24 MED ORDER — LORATADINE 10 MG PO TABS: 10.0000 mg | ORAL_TABLET | Freq: Every day | ORAL | Status: DC

## 2016-03-05 ENCOUNTER — Other Ambulatory Visit: Payer: Self-pay | Admitting: Family Medicine

## 2016-03-14 DIAGNOSIS — J449 Chronic obstructive pulmonary disease, unspecified: Secondary | ICD-10-CM | POA: Diagnosis not present

## 2016-03-15 ENCOUNTER — Other Ambulatory Visit: Payer: Self-pay | Admitting: Family Medicine

## 2016-03-28 ENCOUNTER — Other Ambulatory Visit: Payer: Self-pay | Admitting: Family Medicine

## 2016-04-14 DIAGNOSIS — J449 Chronic obstructive pulmonary disease, unspecified: Secondary | ICD-10-CM | POA: Diagnosis not present

## 2016-05-03 ENCOUNTER — Ambulatory Visit (INDEPENDENT_AMBULATORY_CARE_PROVIDER_SITE_OTHER): Payer: Medicare Other | Admitting: Family Medicine

## 2016-05-03 VITALS — BP 156/72 | HR 90 | Temp 98.1°F | Resp 18 | Wt 242.0 lb

## 2016-05-03 DIAGNOSIS — F32A Depression, unspecified: Secondary | ICD-10-CM

## 2016-05-03 DIAGNOSIS — I10 Essential (primary) hypertension: Secondary | ICD-10-CM | POA: Diagnosis not present

## 2016-05-03 DIAGNOSIS — F329 Major depressive disorder, single episode, unspecified: Secondary | ICD-10-CM | POA: Diagnosis not present

## 2016-05-03 DIAGNOSIS — M5136 Other intervertebral disc degeneration, lumbar region: Secondary | ICD-10-CM

## 2016-05-03 DIAGNOSIS — E038 Other specified hypothyroidism: Secondary | ICD-10-CM | POA: Diagnosis not present

## 2016-05-03 DIAGNOSIS — R739 Hyperglycemia, unspecified: Secondary | ICD-10-CM

## 2016-05-03 DIAGNOSIS — E785 Hyperlipidemia, unspecified: Secondary | ICD-10-CM | POA: Diagnosis not present

## 2016-05-03 DIAGNOSIS — K219 Gastro-esophageal reflux disease without esophagitis: Secondary | ICD-10-CM | POA: Diagnosis not present

## 2016-05-03 LAB — POCT GLYCOSYLATED HEMOGLOBIN (HGB A1C): Hemoglobin A1C: 5.4

## 2016-05-03 MED ORDER — HYDROCODONE-ACETAMINOPHEN 10-325 MG PO TABS: 1.0000 | ORAL_TABLET | ORAL | Status: DC | PRN

## 2016-05-03 NOTE — Progress Notes (Signed)
Subjective:  HPI Patient is here for 3 months follow up.  Hyperglycemia: she is checking her sugar at home and fastings have been around 150 or 200. No hypoglycemic episodes. Lab Results  Component Value Date   HGBA1C 5.7 12/21/2015   Back pain: patient is taking Norco about 1 to 2 tablets daily and has been out of it for about 2 weeks.  COPD: patient uses Oxygen at bedtime 2 1/2 liters and during the day tries to not use it but when she does use it during the day she has it at about 3 liters. She is seen pulmonologist.   Prior to Admission medications   Medication Sig Start Date End Date Taking? Authorizing Provider  ALBUTEROL IN Inhale into the lungs daily.   Yes Historical Provider, MD  ALPRAZolam Prudy Feeler) 0.5 MG tablet Take by mouth. 12/26/14  Yes Historical Provider, MD  DULERA 100-5 MCG/ACT AERO Inhale 2 puffs into the lungs 2 (two) times daily.  09/06/14  Yes Historical Provider, MD  fluticasone (FLONASE) 50 MCG/ACT nasal spray Place into both nostrils daily.   Yes Historical Provider, MD  furosemide (LASIX) 20 MG tablet TAKE 1 TABLET BY MOUTH DAILY 02/06/16  Yes Maple Hudson., MD  gabapentin (NEURONTIN) 300 MG capsule TAKE 2 CAPSULES BY MOUTH 2 TIME A DAY 08/11/15  Yes Maple Hudson., MD  HYDROcodone-acetaminophen [facility]) 10-325 MG tablet Take 1-2 tablets by mouth every 4 (four) hours as needed. 02/01/16  Yes Richard Hulen Shouts., MD  ipratropium-albuterol (DUONEB) 0.5-2.5 (3) MG/3ML SOLN Take 3 mLs by nebulization daily.   Yes Historical Provider, MD  isosorbide mononitrate (IMDUR) 30 MG 24 hr tablet TAKE 1 TABLET (30 MG TOTAL) BY MOUTH ONCE DAILY. 06/13/15  Yes Historical Provider, MD  levothyroxine (SYNTHROID, LEVOTHROID) 125 MCG tablet TAKE 1 TABLET BY MOUTH DAILY 03/15/16  Yes Richard Hulen Shouts., MD  lisinopril (PRINIVIL,ZESTRIL) 10 MG tablet TAKE 1 TABLET BY MOUTH EVERY DAY 03/28/16  Yes Maple Hudson., MD  loratadine (CLARITIN) 10 MG tablet Take 1 tablet  (10 mg total) by mouth daily. 02/24/16  Yes Richard Hulen Shouts., MD  metFORMIN (GLUCOPHAGE) 1000 MG tablet Take by mouth. 11/16/14  Yes Historical Provider, MD  MULTIPLE VITAMIN PO Take by mouth.   Yes Historical Provider, MD  Omeprazole 20 MG TBEC Take by mouth. 12/08/13  Yes Historical Provider, MD  ONE TOUCH ULTRA TEST test strip USE 1 STRIP 2 TIMES DAILY AND AS NEEDED 06/23/15  Yes Richard Hulen Shouts., MD  pravastatin (PRAVACHOL) 10 MG tablet Take 1 tablet (10 mg total) by mouth daily. 08/21/15  Yes Richard Hulen Shouts., MD  SPIRIVA HANDIHALER 18 MCG inhalation capsule  09/06/14  Yes Historical Provider, MD    Patient Active Problem List   Diagnosis Date Noted  . Nonspecific abnormal finding 05/11/2015  . Allergic rhinitis 05/11/2015  . Anxiety 05/11/2015  . Bronchitis, chronic (HCC) 05/11/2015  . CCF (congestive cardiac failure) (HCC) 05/11/2015  . Clinical depression 05/11/2015  . Diabetes mellitus, type 2 (HCC) 05/11/2015  . 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 Essential (primary) hypertension 05/11/2015  . Acid reflux 05/11/2015  . HLD (hyperlipidemia) 05/11/2015  . Adult hypothyroidism 05/11/2015  . Cervical dysplasia, mild 05/11/2015  . Neuropathy (HCC) 05/11/2015  . Adiposity 05/11/2015  . Obstructive apnea 05/11/2015  . Arthritis, degenerative 05/11/2015    No past medical history on file.  Social History   Social History  .  Marital Status: Married    Spouse Name: N/A  . Number of Children: N/A  . Years of Education: N/A   Occupational History  . Not on file.   Social History Main Topics  . Smoking status: Former Smoker -- 0.50 packs/day for 15 years  . Smokeless tobacco: Never Used  . Alcohol Use: No  . Drug Use: No  . Sexual Activity: No   Other Topics Concern  . Not on file   Social History Narrative    Allergies  Allergen Reactions  . Codeine Nausea And Vomiting and Nausea Only    Review of Systems  Constitutional:  Positive for malaise/fatigue.  Respiratory: Positive for shortness of breath and wheezing.   Cardiovascular: Negative.   Gastrointestinal: Positive for diarrhea (at times from Metformin) and constipation (at times from narcotics ). Heartburn: at times.  Musculoskeletal: Positive for myalgias, back pain and joint pain.  Neurological: Positive for tingling (feet) and weakness. Negative for dizziness and tremors.  Psychiatric/Behavioral: Negative.     Immunization History  Administered Date(s) Administered  . Influenza, High Dose Seasonal PF 08/18/2015  . Pneumococcal Conjugate-13 08/24/2014  . Pneumococcal Polysaccharide-23 11/03/1999, 07/23/2012  . Tdap 07/23/2012  . Zoster 07/23/2012   Objective:  BP 156/72 mmHg  Pulse 90  Temp(Src) 98.1 F (36.7 C)  Resp 18  Wt 242 lb (109.77 kg)  SpO2 97%  Physical Exam  Constitutional: She is oriented to person, place, and time and well-developed, well-nourished, and in no distress.  HENT:  Head: Normocephalic and atraumatic.  Right Ear: External ear normal.  Left Ear: External ear normal.  Nose: Nose normal.  Eyes: Conjunctivae are normal. Pupils are equal, round, and reactive to light.  Neck: Normal range of motion. Neck supple.  Cardiovascular: Normal rate, regular rhythm, normal heart sounds and intact distal pulses.   No murmur heard. Pulmonary/Chest: Effort normal and breath sounds normal. No respiratory distress. She has no wheezes.  Abdominal: Soft.  Musculoskeletal: She exhibits no edema or tenderness.  Neurological: She is alert and oriented to person, place, and time.  Skin: Skin is warm and dry.  Psychiatric: Mood, memory, affect and judgment normal.    Lab Results  Component Value Date   WBC 8.5 06/10/2015   HGB 11.3* 07/20/2014   HCT 37.0 06/10/2015   PLT 247 06/10/2015   GLUCOSE 263* 06/10/2015   CHOL 174 06/10/2015   TRIG 150* 06/10/2015   HDL 53 06/10/2015   LDLCALC 91 06/10/2015   TSH 2.51 03/17/2015    HGBA1C 5.7 12/21/2015    CMP     Component Value Date/Time   NA 141 06/10/2015 1636   K 4.3 06/10/2015 1636   CL 97 06/10/2015 1636   CO2 31* 06/10/2015 1636   GLUCOSE 263* 06/10/2015 1636   BUN 8 06/10/2015 1636   CREATININE 0.61 06/10/2015 1636   CREATININE 0.7 03/17/2015   CALCIUM 9.6 06/10/2015 1636   PROT 6.2 06/10/2015 1636   ALBUMIN 3.9 06/10/2015 1636   AST 11 06/10/2015 1636   ALT 6 06/10/2015 1636   ALKPHOS 83 06/10/2015 1636   BILITOT 0.3 06/10/2015 1636   GFRNONAA 90 06/10/2015 1636   GFRAA 104 06/10/2015 1636    Assessment and Plan :  1. DDD (degenerative disc disease), lumbar Stable. Refill for Norco x 2. - HYDROcodone-acetaminophen (NORCO) 10-325 MG tablet; Take 1-2 tablets by mouth every 4 (four) hours as needed.  Dispense: 200 tablet; Refill: 0  2. Essential (primary) hypertension Stable for now. - CBC  with Differential/Platelet - Comprehensive metabolic panel  3. Hyperglycemia A1C 5.4 better, in the normal range. At home she is still getting elevated fasting sugars. Will not make any changes to the medication for now. She does get diarrhea at times but patient did not want to decrease the dose at this time. Follow. - POCT HgB A1C  4. HLD (hyperlipidemia) Check labs today. - Comprehensive metabolic panel - Lipid Panel With LDL/HDL Ratio  5. Other specified hypothyroidism - TSH  6. Clinical depression Stable. 7.Obesity Patient was seen and examined by Dr. Bosie Closichard L Gilbert and note was scribed by Samara DeistAnastasiya Aleksandrova, RMA.   I have done the exam and reviewed the above chart and it is accurate to the best of my knowledge.  Julieanne Mansonichard Gilbert MD [facility] 05/03/2016 11:05 AM

## 2016-05-04 LAB — COMPREHENSIVE METABOLIC PANEL
ALT: 5 IU/L (ref 0–32)
AST: 12 IU/L (ref 0–40)
Albumin/Globulin Ratio: 1.6 (ref 1.2–2.2)
Albumin: 3.9 g/dL (ref 3.5–4.8)
Alkaline Phosphatase: 71 IU/L (ref 39–117)
BUN/Creatinine Ratio: 21 (ref 12–28)
BUN: 12 mg/dL (ref 8–27)
Bilirubin Total: <0.2 mg/dL (ref 0.0–1.2)
CO2: 27 mmol/L (ref 18–29)
Calcium: 8.7 mg/dL (ref 8.7–10.3)
Chloride: 100 mmol/L (ref 96–106)
Creatinine, Ser: 0.58 mg/dL (ref 0.57–1.00)
GFR calc Af Amer: 105 mL/min/{1.73_m2} (ref >59)
GFR calc non Af Amer: 91 mL/min/{1.73_m2} (ref >59)
Globulin, Total: 2.4 g/dL (ref 1.5–4.5)
Glucose: 101 mg/dL — ABNORMAL HIGH (ref 65–99)
Potassium: 4.4 mmol/L (ref 3.5–5.2)
Sodium: 145 mmol/L — ABNORMAL HIGH (ref 134–144)
Total Protein: 6.3 g/dL (ref 6.0–8.5)

## 2016-05-04 LAB — LIPID PANEL WITH LDL/HDL RATIO
Cholesterol, Total: 152 mg/dL (ref 100–199)
HDL: 50 mg/dL (ref >39)
LDL Calculated: 82 mg/dL (ref 0–99)
LDl/HDL Ratio: 1.6 ratio units (ref 0.0–3.2)
Triglycerides: 99 mg/dL (ref 0–149)
VLDL Cholesterol Cal: 20 mg/dL (ref 5–40)

## 2016-05-04 LAB — CBC WITH DIFFERENTIAL/PLATELET
Basophils Absolute: 0 10*3/uL (ref 0.0–0.2)
Basos: 0 %
EOS (ABSOLUTE): 0.2 10*3/uL (ref 0.0–0.4)
Eos: 3 %
Hematocrit: 32.2 % — ABNORMAL LOW (ref 34.0–46.6)
Hemoglobin: 10.3 g/dL — ABNORMAL LOW (ref 11.1–15.9)
Immature Grans (Abs): 0 10*3/uL (ref 0.0–0.1)
Immature Granulocytes: 0 %
Lymphocytes Absolute: 1.3 10*3/uL (ref 0.7–3.1)
Lymphs: 20 %
MCH: 27.3 pg (ref 26.6–33.0)
MCHC: 32 g/dL (ref 31.5–35.7)
MCV: 85 fL (ref 79–97)
Monocytes Absolute: 0.6 10*3/uL (ref 0.1–0.9)
Monocytes: 9 %
Neutrophils Absolute: 4.6 10*3/uL (ref 1.4–7.0)
Neutrophils: 68 %
Platelets: 219 10*3/uL (ref 150–379)
RBC: 3.77 x10E6/uL (ref 3.77–5.28)
RDW: 15.7 % — ABNORMAL HIGH (ref 12.3–15.4)
WBC: 6.8 10*3/uL (ref 3.4–10.8)

## 2016-05-04 LAB — TSH: TSH: 1.3 u[IU]/mL (ref 0.450–4.500)

## 2016-05-14 DIAGNOSIS — J449 Chronic obstructive pulmonary disease, unspecified: Secondary | ICD-10-CM | POA: Diagnosis not present

## 2016-05-15 ENCOUNTER — Other Ambulatory Visit: Payer: Self-pay

## 2016-05-15 MED ORDER — PRAVASTATIN SODIUM 10 MG PO TABS: 10.0000 mg | ORAL_TABLET | Freq: Every day | ORAL | Status: DC

## 2016-05-18 DIAGNOSIS — H2512 Age-related nuclear cataract, left eye: Secondary | ICD-10-CM | POA: Diagnosis not present

## 2016-05-31 ENCOUNTER — Encounter: Payer: Self-pay | Admitting: Family Medicine

## 2016-06-11 NOTE — Telephone Encounter (Signed)
error 

## 2016-06-14 DIAGNOSIS — I270 Primary pulmonary hypertension: Secondary | ICD-10-CM | POA: Diagnosis not present

## 2016-06-14 DIAGNOSIS — J9611 Chronic respiratory failure with hypoxia: Secondary | ICD-10-CM | POA: Diagnosis not present

## 2016-06-14 DIAGNOSIS — J449 Chronic obstructive pulmonary disease, unspecified: Secondary | ICD-10-CM | POA: Diagnosis not present

## 2016-06-22 DIAGNOSIS — H2512 Age-related nuclear cataract, left eye: Secondary | ICD-10-CM | POA: Diagnosis not present

## 2016-06-26 ENCOUNTER — Encounter: Payer: Self-pay | Admitting: *Deleted

## 2016-06-28 ENCOUNTER — Other Ambulatory Visit: Payer: Self-pay | Admitting: Family Medicine

## 2016-06-28 NOTE — Telephone Encounter (Signed)
Has FU 08/28/2016. Allene DillonEmily Drozdowski, CMA

## 2016-07-02 ENCOUNTER — Telehealth: Payer: Self-pay | Admitting: Family Medicine

## 2016-07-02 NOTE — Telephone Encounter (Signed)
Dr. Reece AgarG, have you seen these forms? Please advise. Thanks!

## 2016-07-02 NOTE — Telephone Encounter (Signed)
Pt stated that she dropped off forms that she needed completed and sent to [employer] week before last. Pt is requesting a status update b/c she was advised by [employer] they haven't received the forms. Please advise. Thanks TNP

## 2016-07-03 ENCOUNTER — Ambulatory Visit: Payer: Medicare Other | Admitting: Anesthesiology

## 2016-07-03 ENCOUNTER — Encounter: Payer: Self-pay | Admitting: *Deleted

## 2016-07-03 ENCOUNTER — Encounter
Admission: RE | Disposition: A | Discharge status: Home / Self Care | Payer: Self-pay | Source: Ambulatory Visit | Attending: Ophthalmology

## 2016-07-03 ENCOUNTER — Ambulatory Visit
Admission: RE | Admit: 2016-07-03 | Discharge: 2016-07-03 | Disposition: A | Discharge status: Home / Self Care | Payer: Medicare Other | Source: Ambulatory Visit | Attending: Ophthalmology | Admitting: Ophthalmology

## 2016-07-03 DIAGNOSIS — M1990 Unspecified osteoarthritis, unspecified site: Secondary | ICD-10-CM | POA: Diagnosis not present

## 2016-07-03 DIAGNOSIS — I110 Hypertensive heart disease with heart failure: Secondary | ICD-10-CM | POA: Diagnosis not present

## 2016-07-03 DIAGNOSIS — Z9981 Dependence on supplemental oxygen: Secondary | ICD-10-CM | POA: Insufficient documentation

## 2016-07-03 DIAGNOSIS — K219 Gastro-esophageal reflux disease without esophagitis: Secondary | ICD-10-CM | POA: Diagnosis not present

## 2016-07-03 DIAGNOSIS — Z87891 Personal history of nicotine dependence: Secondary | ICD-10-CM | POA: Insufficient documentation

## 2016-07-03 DIAGNOSIS — J449 Chronic obstructive pulmonary disease, unspecified: Secondary | ICD-10-CM | POA: Diagnosis not present

## 2016-07-03 DIAGNOSIS — I509 Heart failure, unspecified: Secondary | ICD-10-CM | POA: Insufficient documentation

## 2016-07-03 DIAGNOSIS — G4730 Sleep apnea, unspecified: Secondary | ICD-10-CM | POA: Diagnosis not present

## 2016-07-03 DIAGNOSIS — H2511 Age-related nuclear cataract, right eye: Secondary | ICD-10-CM | POA: Diagnosis not present

## 2016-07-03 DIAGNOSIS — H2512 Age-related nuclear cataract, left eye: Secondary | ICD-10-CM | POA: Insufficient documentation

## 2016-07-03 DIAGNOSIS — E119 Type 2 diabetes mellitus without complications: Secondary | ICD-10-CM | POA: Insufficient documentation

## 2016-07-03 DIAGNOSIS — I10 Essential (primary) hypertension: Secondary | ICD-10-CM | POA: Diagnosis not present

## 2016-07-03 DIAGNOSIS — E039 Hypothyroidism, unspecified: Secondary | ICD-10-CM | POA: Insufficient documentation

## 2016-07-03 HISTORY — DX: Essential (primary) hypertension: I10

## 2016-07-03 HISTORY — DX: Polyneuropathy, unspecified: G62.9

## 2016-07-03 HISTORY — DX: Cardiac murmur, unspecified: R01.1

## 2016-07-03 HISTORY — DX: Personal history of other specified conditions: Z87.898

## 2016-07-03 HISTORY — PX: CATARACT EXTRACTION W/PHACO: SHX586

## 2016-07-03 HISTORY — DX: Reserved for inherently not codable concepts without codable children: IMO0001

## 2016-07-03 HISTORY — DX: Chronic obstructive pulmonary disease, unspecified: J44.9

## 2016-07-03 HISTORY — DX: Dependence on supplemental oxygen: Z99.81

## 2016-07-03 HISTORY — DX: Type 2 diabetes mellitus without complications: E11.9

## 2016-07-03 HISTORY — DX: Wheezing: R06.2

## 2016-07-03 HISTORY — DX: Unspecified osteoarthritis, unspecified site: M19.90

## 2016-07-03 HISTORY — DX: Cardiac arrhythmia, unspecified: I49.9

## 2016-07-03 HISTORY — DX: Pain, unspecified: R52

## 2016-07-03 HISTORY — DX: Hypothyroidism, unspecified: E03.9

## 2016-07-03 LAB — GLUCOSE, CAPILLARY: Glucose-Capillary: 133 mg/dL — ABNORMAL HIGH (ref 65–99)

## 2016-07-03 SURGERY — PHACOEMULSIFICATION, CATARACT, WITH IOL INSERTION
Anesthesia: Monitor Anesthesia Care | Site: Eye | Laterality: Left | Wound class: Clean

## 2016-07-03 MED ORDER — TETRACAINE HCL 0.5 % OP SOLN
1.0000 [drp] | Freq: Once | OPHTHALMIC | Status: AC
  Administered 2016-07-03: 1 [drp] via OPHTHALMIC

## 2016-07-03 MED ORDER — EPINEPHRINE HCL 1 MG/ML IJ SOLN
INTRAMUSCULAR | Status: AC
  Filled 2016-07-03: qty 2

## 2016-07-03 MED ORDER — CEFUROXIME OPHTHALMIC INJECTION 1 MG/0.1 ML
INJECTION | OPHTHALMIC | Status: DC | PRN
  Administered 2016-07-03: 0.1 mL via INTRACAMERAL

## 2016-07-03 MED ORDER — MIDAZOLAM HCL 2 MG/2ML IJ SOLN
INTRAMUSCULAR | Status: DC | PRN
  Administered 2016-07-03: .5 mg via INTRAVENOUS

## 2016-07-03 MED ORDER — CEFUROXIME OPHTHALMIC INJECTION 1 MG/0.1 ML
INJECTION | OPHTHALMIC | Status: AC
  Filled 2016-07-03: qty 0.1

## 2016-07-03 MED ORDER — FENTANYL CITRATE (PF) 100 MCG/2ML IJ SOLN
INTRAMUSCULAR | Status: DC | PRN
  Administered 2016-07-03: 25 ug via INTRAVENOUS

## 2016-07-03 MED ORDER — MOXIFLOXACIN HCL 0.5 % OP SOLN
OPHTHALMIC | Status: AC
  Filled 2016-07-03: qty 3

## 2016-07-03 MED ORDER — POVIDONE-IODINE 5 % OP SOLN
1.0000 "application " | Freq: Once | OPHTHALMIC | Status: AC
  Administered 2016-07-03: 1 via OPHTHALMIC

## 2016-07-03 MED ORDER — ARMC OPHTHALMIC DILATING GEL
OPHTHALMIC | Status: AC
  Administered 2016-07-03: 1 via OPHTHALMIC
  Filled 2016-07-03: qty 0.25

## 2016-07-03 MED ORDER — TETRACAINE HCL 0.5 % OP SOLN
OPHTHALMIC | Status: AC
  Administered 2016-07-03: 1 [drp] via OPHTHALMIC
  Filled 2016-07-03: qty 2

## 2016-07-03 MED ORDER — NA CHONDROIT SULF-NA HYALURON 40-17 MG/ML IO SOLN
INTRAOCULAR | Status: DC | PRN
  Administered 2016-07-03: 1 mL via INTRAOCULAR

## 2016-07-03 MED ORDER — EPINEPHRINE HCL 1 MG/ML IJ SOLN
INTRAOCULAR | Status: DC | PRN
  Administered 2016-07-03: 200 mL via OPHTHALMIC

## 2016-07-03 MED ORDER — MOXIFLOXACIN HCL 0.5 % OP SOLN: 1.0000 [drp] | OPHTHALMIC | Status: DC | PRN

## 2016-07-03 MED ORDER — MOXIFLOXACIN HCL 0.5 % OP SOLN
OPHTHALMIC | Status: DC | PRN
  Administered 2016-07-03: 1 [drp] via OPHTHALMIC

## 2016-07-03 MED ORDER — CARBACHOL 0.01 % IO SOLN
INTRAOCULAR | Status: DC | PRN
  Administered 2016-07-03: 0.5 mL via INTRAOCULAR

## 2016-07-03 MED ORDER — LIDOCAINE HCL (PF) 4 % IJ SOLN
INTRAMUSCULAR | Status: AC
  Filled 2016-07-03: qty 30

## 2016-07-03 MED ORDER — POVIDONE-IODINE 5 % OP SOLN
OPHTHALMIC | Status: AC
  Administered 2016-07-03: 1 via OPHTHALMIC
  Filled 2016-07-03: qty 30

## 2016-07-03 MED ORDER — SODIUM CHLORIDE 0.9 % IV SOLN
INTRAVENOUS | Status: DC
  Administered 2016-07-03 (×2): via INTRAVENOUS

## 2016-07-03 MED ORDER — ARMC OPHTHALMIC DILATING GEL
1.0000 "application " | OPHTHALMIC | Status: AC | PRN
  Administered 2016-07-03 (×2): 1 via OPHTHALMIC

## 2016-07-03 MED ORDER — NA CHONDROIT SULF-NA HYALURON 40-17 MG/ML IO SOLN
INTRAOCULAR | Status: AC
  Filled 2016-07-03: qty 1

## 2016-07-03 SURGICAL SUPPLY — 21 items
CANNULA ANT/CHMB 27GA (MISCELLANEOUS) ×3 IMPLANT
CUP MEDICINE 2OZ PLAST GRAD ST (MISCELLANEOUS) ×3 IMPLANT
GLOVE BIO SURGEON STRL SZ8 (GLOVE) ×3 IMPLANT
GLOVE BIOGEL M 6.5 STRL (GLOVE) ×3 IMPLANT
GLOVE SURG LX 8.0 MICRO (GLOVE) ×2
GLOVE SURG LX STRL 8.0 MICRO (GLOVE) ×1 IMPLANT
GOWN STRL REUS W/ TWL LRG LVL3 (GOWN DISPOSABLE) ×2 IMPLANT
GOWN STRL REUS W/TWL LRG LVL3 (GOWN DISPOSABLE) ×4
LENS IOL TECNIS ITEC 19.5 (Intraocular Lens) ×3 IMPLANT
PACK CATARACT (MISCELLANEOUS) ×3 IMPLANT
PACK CATARACT BRASINGTON LX (MISCELLANEOUS) ×3 IMPLANT
PACK EYE AFTER SURG (MISCELLANEOUS) ×3 IMPLANT
SOL BSS BAG (MISCELLANEOUS) ×3
SOL PREP PVP 2OZ (MISCELLANEOUS) ×3
SOLUTION BSS BAG (MISCELLANEOUS) ×1 IMPLANT
SOLUTION PREP PVP 2OZ (MISCELLANEOUS) ×1 IMPLANT
SYR 3ML LL SCALE MARK (SYRINGE) ×3 IMPLANT
SYR 5ML LL (SYRINGE) ×3 IMPLANT
SYR TB 1ML 27GX1/2 LL (SYRINGE) ×3 IMPLANT
WATER STERILE IRR 250ML POUR (IV SOLUTION) ×3 IMPLANT
WIPE NON LINTING 3.25X3.25 (MISCELLANEOUS) ×3 IMPLANT

## 2016-07-03 NOTE — Anesthesia Preprocedure Evaluation (Addendum)
Anesthesia Evaluation  Patient identified by MRN, date of birth, ID band Patient awake    Reviewed: Allergy & Precautions, NPO status , Patient's Chart, lab work & pertinent test results  History of Anesthesia Complications Negative for: history of anesthetic complications  Airway Mallampati: II       Dental   Pulmonary shortness of breath, sleep apnea (Pt denies) , COPD,  COPD inhaler and oxygen dependent, former smoker,           Cardiovascular hypertension, Pt. on medications +CHF and + Orthopnea  + dysrhythmias Atrial Fibrillation + Valvular Problems/Murmurs (Pt denies)      Neuro/Psych Anxiety Depression    GI/Hepatic GERD (PT denies)  ,  Endo/Other  diabetes, Type 2Hypothyroidism   Renal/GU      Musculoskeletal  (+) Arthritis , Osteoarthritis,    Abdominal   Peds  Hematology   Anesthesia Other Findings   Reproductive/Obstetrics                            Anesthesia Physical Anesthesia Plan  ASA: III  Anesthesia Plan: MAC   Post-op Pain Management:    Induction:   Airway Management Planned:   Additional Equipment:   Intra-op Plan:   Post-operative Plan:   Informed Consent: I have reviewed the patients History and Physical, chart, labs and discussed the procedure including the risks, benefits and alternatives for the proposed anesthesia with the patient or authorized representative who has indicated his/her understanding and acceptance.     Plan Discussed with:   Anesthesia Plan Comments:         Anesthesia Quick Evaluation

## 2016-07-03 NOTE — Anesthesia Postprocedure Evaluation (Signed)
Anesthesia Post Note  Patient: Kristen Schroeder  Procedure(s) Performed: Procedure(s) (LRB): CATARACT EXTRACTION PHACO AND INTRAOCULAR LENS PLACEMENT (IOC) (Left)  Patient location during evaluation: Short Stay Anesthesia Type: MAC Level of consciousness: awake Pain management: pain level controlled Vital Signs Assessment: post-procedure vital signs reviewed and stable Respiratory status: spontaneous breathing Cardiovascular status: stable Anesthetic complications: no    Last Vitals:  Vitals:   07/03/16 0607 07/03/16 0757  BP: (!) 164/83 114/89  Pulse: 91 76  Resp: 16 16  Temp: 36.2 C 36.9 C    Last Pain:  Vitals:   07/03/16 0607  TempSrc: Tympanic                 Stpehen Petitjean K

## 2016-07-03 NOTE — Op Note (Signed)
PREOPERATIVE DIAGNOSIS:  Nuclear sclerotic cataract of the left eye.   POSTOPERATIVE DIAGNOSIS:  Left nuclear sclerotic CATARACT   OPERATIVE PROCEDURE:  Procedure(s): CATARACT EXTRACTION PHACO AND INTRAOCULAR LENS PLACEMENT (IOC)   SURGEON:  Galen ManilaWilliam Zakyra Kukuk, MD.   ANESTHESIA:   Anesthesiologist: Naomie DeanWilliam K Kephart, MD CRNA: Henrietta HooverKimberly Pope, CRNA  1.      Managed anesthesia care. 2.      Topical tetracaine drops followed by 2% Xylocaine jelly applied in the preoperative holding area.   COMPLICATIONS:  None.   TECHNIQUE:   Stop and chop   DESCRIPTION OF PROCEDURE:  The patient was examined and consented in the preoperative holding area where the aforementioned topical anesthesia was applied to the left eye and then brought back to the Operating Room where the left eye was prepped and draped in the usual sterile ophthalmic fashion and a lid speculum was placed. A paracentesis was created with the side port blade and the anterior chamber was filled with viscoelastic. A near clear corneal incision was performed with the steel keratome. A continuous curvilinear capsulorrhexis was performed with a cystotome followed by the capsulorrhexis forceps. Hydrodissection and hydrodelineation were carried out with BSS on a blunt cannula. The lens was removed in a stop and chop  technique and the remaining cortical material was removed with the irrigation-aspiration handpiece. The capsular bag was inflated with viscoelastic and the Technis ZCB00 lens was placed in the capsular bag without complication. The remaining viscoelastic was removed from the eye with the irrigation-aspiration handpiece. The wounds were hydrated. The anterior chamber was flushed with Miostat and the eye was inflated to physiologic pressure. 0.1 mL of cefuroxime concentration 10 mg/mL was placed in the anterior chamber. The wounds were found to be water tight. The eye was dressed with Vigamox. The patient was given protective glasses to wear  throughout the day and a shield with which to sleep tonight. The patient was also given drops with which to begin a drop regimen today and will follow-up with me in one day.  Implant Name Type Inv. Item Serial No. Manufacturer Lot No. LRB No. Used  LENS IOL DIOP 19.5 - [id number] Intraocular Lens LENS IOL DIOP 19.5 [id number]   Left 1   Procedure(s) with comments: CATARACT EXTRACTION PHACO AND INTRAOCULAR LENS PLACEMENT (IOC) (Left) - Lot: [id number] H US: 00:40.1 AP%: 17.4 CDE:6.94  Electronically signed: Quinne Pires LOUIS 07/03/2016 7:53 AM

## 2016-07-03 NOTE — Discharge Instructions (Signed)

## 2016-07-03 NOTE — H&P (Signed)
  All labs reviewed. Abnormal studies sent to patients PCP when indicated.  Previous H&P reviewed, patient examined, there are NO CHANGES.  Kristen Schroeder LOUIS8/29/20177:15 AM

## 2016-07-03 NOTE — Anesthesia Procedure Notes (Addendum)
Procedure Name: MAC Date/Time: 07/03/2016 7:30 AM Performed by: Henrietta HooverPOPE, Sheenah Dimitroff Pre-anesthesia Checklist: Patient identified, Emergency Drugs available, Suction available, Patient being monitored and Timeout performed Patient Re-evaluated:Patient Re-evaluated prior to inductionOxygen Delivery Method: Nasal cannula Placement Confirmation: positive ETCO2

## 2016-07-03 NOTE — Transfer of Care (Signed)
Immediate Anesthesia Transfer of Care Note  Patient: Kristen Schroeder  Procedure(s) Performed: Procedure(s) with comments: CATARACT EXTRACTION PHACO AND INTRAOCULAR LENS PLACEMENT (IOC) (Left) - Lot: [id number] H US: 00:40.1 AP%: 17.4 CDE:6.94  Patient Location: PACU  Anesthesia Type:MAC  Level of Consciousness: awake  Airway & Oxygen Therapy: Patient Spontanous Breathing and Patient connected to nasal cannula oxygen  Post-op Assessment: Report given to RN and Post -op Vital signs reviewed and stable  Post vital signs: Reviewed and stable  Last Vitals:  Vitals:   07/03/16 0607  BP: (!) 164/83  Pulse: 91  Resp: 16  Temp: 36.2 C    Last Pain:  Vitals:   07/03/16 0607  TempSrc: Tympanic         Complications: No apparent anesthesia complications

## 2016-07-04 ENCOUNTER — Other Ambulatory Visit: Payer: Self-pay | Admitting: Family Medicine

## 2016-07-04 DIAGNOSIS — Z1231 Encounter for screening mammogram for malignant neoplasm of breast: Secondary | ICD-10-CM

## 2016-07-05 ENCOUNTER — Encounter: Payer: Self-pay | Admitting: *Deleted

## 2016-07-05 ENCOUNTER — Emergency Department: Payer: Medicare Other

## 2016-07-05 ENCOUNTER — Emergency Department
Admission: EM | Admit: 2016-07-05 | Discharge: 2016-07-06 | Disposition: A | Discharge status: Home / Self Care | Payer: Medicare Other | Attending: Emergency Medicine | Admitting: Emergency Medicine

## 2016-07-05 DIAGNOSIS — I509 Heart failure, unspecified: Secondary | ICD-10-CM | POA: Insufficient documentation

## 2016-07-05 DIAGNOSIS — I110 Hypertensive heart disease with heart failure: Secondary | ICD-10-CM | POA: Insufficient documentation

## 2016-07-05 DIAGNOSIS — J449 Chronic obstructive pulmonary disease, unspecified: Secondary | ICD-10-CM | POA: Diagnosis not present

## 2016-07-05 DIAGNOSIS — E119 Type 2 diabetes mellitus without complications: Secondary | ICD-10-CM | POA: Insufficient documentation

## 2016-07-05 DIAGNOSIS — I671 Cerebral aneurysm, nonruptured: Secondary | ICD-10-CM | POA: Diagnosis not present

## 2016-07-05 DIAGNOSIS — E039 Hypothyroidism, unspecified: Secondary | ICD-10-CM | POA: Insufficient documentation

## 2016-07-05 DIAGNOSIS — Z7984 Long term (current) use of oral hypoglycemic drugs: Secondary | ICD-10-CM | POA: Diagnosis not present

## 2016-07-05 DIAGNOSIS — Z79899 Other long term (current) drug therapy: Secondary | ICD-10-CM | POA: Insufficient documentation

## 2016-07-05 DIAGNOSIS — R42 Dizziness and giddiness: Secondary | ICD-10-CM | POA: Insufficient documentation

## 2016-07-05 DIAGNOSIS — Z87891 Personal history of nicotine dependence: Secondary | ICD-10-CM | POA: Insufficient documentation

## 2016-07-05 DIAGNOSIS — R112 Nausea with vomiting, unspecified: Secondary | ICD-10-CM | POA: Diagnosis not present

## 2016-07-05 DIAGNOSIS — R404 Transient alteration of awareness: Secondary | ICD-10-CM | POA: Diagnosis not present

## 2016-07-05 DIAGNOSIS — Z791 Long term (current) use of non-steroidal anti-inflammatories (NSAID): Secondary | ICD-10-CM | POA: Insufficient documentation

## 2016-07-05 DIAGNOSIS — R51 Headache: Secondary | ICD-10-CM | POA: Diagnosis present

## 2016-07-05 LAB — CBC
HCT: 32.6 % — ABNORMAL LOW (ref 35.0–47.0)
Hemoglobin: 10.5 g/dL — ABNORMAL LOW (ref 12.0–16.0)
MCH: 28.5 pg (ref 26.0–34.0)
MCHC: 32.2 g/dL (ref 32.0–36.0)
MCV: 88.5 fL (ref 80.0–100.0)
Platelets: 210 10*3/uL (ref 150–440)
RBC: 3.69 MIL/uL — ABNORMAL LOW (ref 3.80–5.20)
RDW: 16.8 % — ABNORMAL HIGH (ref 11.5–14.5)
WBC: 11.5 10*3/uL — ABNORMAL HIGH (ref 3.6–11.0)

## 2016-07-05 LAB — BASIC METABOLIC PANEL
Anion gap: 6 (ref 5–15)
BUN: 15 mg/dL (ref 6–20)
CO2: 33 mmol/L — ABNORMAL HIGH (ref 22–32)
Calcium: 8.5 mg/dL — ABNORMAL LOW (ref 8.9–10.3)
Chloride: 100 mmol/L — ABNORMAL LOW (ref 101–111)
Creatinine, Ser: 0.56 mg/dL (ref 0.44–1.00)
GFR calc Af Amer: >60 mL/min (ref >60)
GFR calc non Af Amer: >60 mL/min (ref >60)
Glucose, Bld: 145 mg/dL — ABNORMAL HIGH (ref 65–99)
Potassium: 3.9 mmol/L (ref 3.5–5.1)
Sodium: 139 mmol/L (ref 135–145)

## 2016-07-05 LAB — TROPONIN I: Troponin I: <0.03 ng/mL (ref <0.03)

## 2016-07-05 MED ORDER — IOPAMIDOL (ISOVUE-370) INJECTION 76%
100.0000 mL | Freq: Once | INTRAVENOUS | Status: AC | PRN
  Administered 2016-07-05: 100 mL via INTRAVENOUS

## 2016-07-05 MED ORDER — MECLIZINE HCL 25 MG PO TABS
25.0000 mg | ORAL_TABLET | Freq: Once | ORAL | Status: AC
  Administered 2016-07-05: 25 mg via ORAL
  Filled 2016-07-05: qty 1

## 2016-07-05 MED ORDER — ONDANSETRON 4 MG PO TBDP: 4.0000 mg | ORAL_TABLET | Freq: Three times a day (TID) | ORAL | 0 refills | Status: DC | PRN

## 2016-07-05 MED ORDER — MECLIZINE HCL 25 MG PO TABS: 25.0000 mg | ORAL_TABLET | Freq: Three times a day (TID) | ORAL | 0 refills | Status: DC | PRN

## 2016-07-05 MED ORDER — ONDANSETRON HCL 4 MG/2ML IJ SOLN
4.0000 mg | Freq: Once | INTRAMUSCULAR | Status: AC
  Administered 2016-07-05: 4 mg via INTRAVENOUS
  Filled 2016-07-05: qty 2

## 2016-07-05 MED ORDER — ACETAMINOPHEN 325 MG PO TABS
650.0000 mg | ORAL_TABLET | Freq: Once | ORAL | Status: AC
  Administered 2016-07-05: 650 mg via ORAL
  Filled 2016-07-05: qty 2

## 2016-07-05 NOTE — Discharge Instructions (Addendum)
Please drink plenty of fluid to stay well hydrated, and eat small regular healthy meals throughout the day. Please get plenty of rest. You may take meclizine if you develop vertigo. Return to the emergency department if you develop severe pain, dizziness that does not improve with meclizine, inability to keep down fluids, fever, or any other symptoms concerning to you.

## 2016-07-05 NOTE — ED Triage Notes (Signed)
Pt presents w/ c/o dizziness and nausea starting after eating this morning around 1000. Pt denies weakness, urinary sxs, fever, new dyspnea, and chest pain. Pt recent post-op cataract surgery. Pt A&O x4. Pt slowly ambulatory w/ assist. Pt c/o epigastric discomfort to EMS and EKG performed by them. Pt received zofran 4 mg iv push from EMS. Pt states relief from nausea at present time. Pt states she is presently dizzy when she moves her head. Pt states she has experienced similar in distant past.

## 2016-07-05 NOTE — ED Provider Notes (Signed)
[facility] Emergency Department Provider Note  ____________________________________________  Time seen: Approximately 9:59 PM  I have reviewed the triage vital signs and the nursing notes.   HISTORY  Chief Complaint Dizziness and Nausea    HPI Kristen Schroeder is a 75 y.o. female with a history of COPD on 3 L nasal cannula, HTN, HL, DM presenting for lightheadedness, dizziness, nausea. The patient reports that after eating breakfast at 10 AM, she developed an acute onset of lightheadedness and dizziness while seated. The dizziness is worse with positional changes of the head. This was associated with nausea but she was unable to vomit. She went to sleep for several hours, and when she woke up her symptoms persisted. She also had a mild frontal headache, for which she took aspirin but this did not relieve her headache. She has had no trauma, fever, chills, tick bite, visual changes, speech changes, or difficulty with walking. The symptoms feel similar to when she has had vertigo in the past.She is not had any recent illness including cough or cold symptoms, nausea vomiting or diarrhea.   Past Medical History:  Diagnosis Date  . Arthritis   . COPD (chronic obstructive pulmonary disease) (HCC)   . Diabetes mellitus without complication (HCC)   . Dysrhythmia   . Heart murmur   . History of orthopnea   . Hypertension   . Hypothyroidism   . Neuropathy (HCC)   . Oxygen dependent    3L  CONTINUOUS  . Pain CHRONIC BACK PAIN  . Shortness of breath dyspnea   . Wheezing     Patient Active Problem List   Diagnosis Date Noted  . Nonspecific abnormal finding 05/11/2015  . Allergic rhinitis 05/11/2015  . Anxiety 05/11/2015  . Bronchitis, chronic (HCC) 05/11/2015  . CCF (congestive cardiac failure) (HCC) 05/11/2015  . Clinical depression 05/11/2015  . Diabetes mellitus, type 2 (HCC) 05/11/2015  . 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 Essential  (primary) hypertension 05/11/2015  . Acid reflux 05/11/2015  . HLD (hyperlipidemia) 05/11/2015  . Adult hypothyroidism 05/11/2015  . Cervical dysplasia, mild 05/11/2015  . Neuropathy (HCC) 05/11/2015  . Adiposity 05/11/2015  . Obstructive apnea 05/11/2015  . Arthritis, degenerative 05/11/2015    Past Surgical History:  Procedure Laterality Date  . ABDOMINAL HYSTERECTOMY    . CATARACT EXTRACTION W/PHACO Left 07/03/2016   Procedure: CATARACT EXTRACTION PHACO AND INTRAOCULAR LENS PLACEMENT (IOC);  Surgeon: Galen Manila, MD;  Location: [facility] ORS;  Service: Ophthalmology;  Laterality: Left;  Lot: [id number] H [place]: 00:40.1 AP%: 17.4 CDE:6.94  . TUBAL LIGATION      Current Outpatient Rx  . Order #: [id number] Class: Historical Med  . Order #: [id number] Class: Historical Med  . Order #: [id number] Class: Historical Med  . Order #: [id number] Class: Historical Med  . Order #: [id number] Class: Normal  . Order #: [id number] Class: Normal  . Order #: [id number] Class: Print  . Order #: [id number] Class: Historical Med  . Order #: [id number] Class: Historical Med  . Order #: [id number] Class: Normal  . Order #: [id number] Class: Print  . Order #: [id number] Class: Historical Med  . Order #: [id number] Class: Historical Med  . Order #: [id number] Class: Print  . Order #: [id number] Class: Normal  . Order #: [id number] Class: Normal  . Order #: [id number] Class: Historical Med    Allergies Codeine  Family History  Problem Relation Age of Onset  . Heart disease Mother   . Drug abuse Other   . Hypertension Other     Social History Social History  Substance Use Topics  . Smoking status: Former Smoker    Packs/day: 0.50    Years: 15.00  . Smokeless tobacco: Never Used  . Alcohol use No    Review of Systems Constitutional: No fever/chills.Positive lightheadedness and dizziness.  Eyes: No visual changes. ENT: No sore throat. No congestion or rhinorrhea. Cardiovascular: Denies chest pain. Denies  palpitations. Respiratory: Denies shortness of breath.  No cough. Gastrointestinal: No abdominal pain.  Positive nausea, no vomiting.  No diarrhea.  No constipation. Genitourinary: Negative for dysuria. No urinary frequency. Musculoskeletal: Negative for back pain. Skin: Negative for rash. Neurological: Positive for headaches. No focal numbness, tingling or weakness. Positive dizziness and lightheadedness.  10-point ROS otherwise negative.  ____________________________________________   PHYSICAL EXAM:  VITAL SIGNS: ED Triage Vitals  Enc Vitals Group     BP 07/05/16 2041 (!) 131/53     Pulse Rate 07/05/16 2041 68     Resp --      Temp 07/05/16 2041 97.7 F (36.5 C)     Temp Source 07/05/16 2041 Oral     SpO2 07/05/16 2041 98 %     Weight 07/05/16 2048 233 lb (105.7 kg)     Height 07/05/16 2048 5\' 7"  (1.702 m)     Head Circumference --      Peak Flow --      Pain Score --      Pain Loc --      Pain Edu? --      Excl. in GC? --     Constitutional: Alert and oriented. Well appearing and in no acute distress. Answers questions appropriately. Eyes: Conjunctivae are normal.  EOMI.PERRLA. No scleral icterus. Head: Atraumatic. I'm unable to reproduce the patient's symptoms with movement of the head. Nose: No congestion/rhinnorhea. Mouth/Throat: Mucous membranes are moist.  Neck: No stridor.  Supple.   Cardiovascular: Normal rate, regular rhythm. No murmurs, rubs or gallops.  Respiratory: Normal respiratory effort.  No accessory muscle use or retractions. Lungs CTAB.  No wheezes, rales or ronchi. Gastrointestinal: Obese. Soft, nontender and nondistended.  No guarding or rebound.  No peritoneal signs. Musculoskeletal: No LE edema. No ttp in the calves or palpable cords.  Negative Homan's sign. Neurologic: Alert and oriented 3. Speech is clear.  Face and smile symmetric. Tongue has a mild deviation to the right. EOMI. PERRLA. No horizontal or vertical nystagmus. No pronator drift. 5  out of 5 grip, biceps, triceps, hip flexors, plantar flexion and dorsiflexion. Normal sensation to light touch in the bilateral upper and lower extremities, and face. Normal finger-nose-finger testing without ataxia. .Skin:  Skin is warm, dry and intact. No rash noted. Psychiatric: Mood and affect are normal. Speech and behavior are normal.  Normal judgement.  ____________________________________________   LABS (all labs ordered are listed, but only abnormal results are displayed)  Labs Reviewed  BASIC METABOLIC PANEL - Abnormal; Notable for the following:       Result Value   Chloride 100 (*)    CO2 33 (*)    Glucose, Bld 145 (*)    Calcium 8.5 (*)    All other components within normal limits  CBC - Abnormal; Notable for the following:    WBC 11.5 (*)    RBC 3.69 (*)    Hemoglobin 10.5 (*)    HCT 32.6 (*)    RDW 16.8 (*)    All other components within normal limits  TROPONIN I  CBG MONITORING, ED   ____________________________________________  EKG  ED ECG REPORT  Alfredo BachI, Kerby Hockley, Anne-Caroline, the attending physician, personally viewed and interpreted this ECG.   Date: 07/05/2016  EKG Time: 2050  Rate: 70  Rhythm: normal sinus rhythm  Axis: Leftward  Intervals:none  ST&T Change: No ST elevation. No arrhythmia.  ____________________________________________  RADIOLOGY  Ct Angio Head W Or Wo Contrast  Result Date: 07/05/2016 CLINICAL DATA:  Dizziness and nausea. EXAM: CT ANGIOGRAPHY HEAD AND NECK TECHNIQUE: Multidetector CT imaging of the head and neck was performed using the standard protocol during bolus administration of intravenous contrast. Multiplanar CT image reconstructions and MIPs were obtained to evaluate the vascular anatomy. Carotid stenosis measurements (when applicable) are obtained utilizing NASCET criteria, using the distal internal carotid diameter as the denominator. CONTRAST:  100 mL Isovue 370 IV COMPARISON:  Head CT 01/06/2013 FINDINGS: CT HEAD Brain:  There is no mass lesion, intraparenchymal hemorrhage or extra-axial collection. No evidence of acute cortical infarct. No hydrocephalus. Brain parenchyma and CSF-containing spaces are normal for age. Calvarium and skull base: Normal Paranasal sinuses: Clear Orbits: Normal CTA NECK Aortic arch: Mild aortic arch atherosclerotic calcification. Right carotid system: There is a medial course of the right common carotid artery at the level of the pharynx. No hemodynamically significant stenosis. Minimal atherosclerotic calcification. Left carotid system: A portion of the left common carotid course is obscured by streak artifact from the contrast bolus. The distal common carotid follows a medial course of level the pharynx. No hemodynamically significant stenosis. Vertebral arteries:Right vertebral artery origin is normal. Left vertebral artery origin is poorly visualized due to streak artifact from contrast bolus. The cervical courses of both vertebral arteries are normal. The vertebral system is codominant. Skeleton: There is multilevel facet hypertrophy and cervical osteophytosis. No advanced bony canal stenosis. Other neck: Orbits and globes are unremarkable. Salivary glands are normal. Normal appearance of the pharynx and larynx. Unremarkable thyroid gland. Upper chest: No pulmonary nodule or pleural effusion. CTA HEAD Intracranial internal carotid arteries: Bilateral atherosclerotic calcification without hemodynamically significant stenosis. 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 There is a right P-comm aneurysm projecting posteriorly and laterally, measuring 2.8 mm at its neck and 3 mm base to apex (series 7, image 101; series 8, image 104; series 9, image 64). Anterior cerebral arteries: Normal. Middle cerebral arteries: Normal. Posterior communicating arteries: As  above, 3 mm aneurysm at the right P-comm origin. There is no left P-comm identified. Posterior cerebral arteries: Normal. Basilar artery: Normal. Vertebral arteries: Left dominant. Normal. Superior cerebellar arteries: Normal. Anterior inferior cerebellar arteries: Normal. Posterior inferior cerebellar arteries: Normal. Venous sinuses: Normal Delayed phase: No parenchymal contrast enhancement. IMPRESSION: 1. No acute intracranial abnormality. 2. No occlusion or severe stenosis of the arteries of the circle of Willis. 3. Right P-comm aneurysm measuring 3 millimeters. 4. 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 5. Aortic atherosclerosis. No hemodynamically significant carotid or vertebral artery stenosis. Electronically Signed   By: Deatra RobinsonKevin  Herman M.D.   On: 07/05/2016 23:13   Ct Angio Neck W And/or Wo Contrast  Result Date: 07/05/2016 CLINICAL DATA:  Dizziness and nausea. EXAM: CT ANGIOGRAPHY HEAD AND NECK TECHNIQUE: Multidetector CT imaging of the head and neck was performed using the standard protocol during bolus administration of intravenous contrast. Multiplanar CT image reconstructions and MIPs were obtained to evaluate the vascular anatomy. Carotid stenosis measurements (when applicable) are obtained utilizing NASCET  criteria, using the distal internal carotid diameter as the denominator. CONTRAST:  100 mL Isovue 370 IV COMPARISON:  Head CT 01/06/2013 FINDINGS: CT HEAD Brain: There is no mass lesion, intraparenchymal hemorrhage or extra-axial collection. No evidence of acute cortical infarct. No hydrocephalus. Brain parenchyma and CSF-containing spaces are normal for age. Calvarium and skull base: Normal Paranasal sinuses: Clear Orbits: Normal CTA NECK Aortic arch: Mild aortic arch atherosclerotic calcification. Right carotid system: There is a medial course of the right common carotid artery at the level of the pharynx. No hemodynamically significant stenosis. Minimal atherosclerotic  calcification. Left carotid system: A portion of the left common carotid course is obscured by streak artifact from the contrast bolus. The distal common carotid follows a medial course of level the pharynx. No hemodynamically significant stenosis. Vertebral arteries:Right vertebral artery origin is normal. Left vertebral artery origin is poorly visualized due to streak artifact from contrast bolus. The cervical courses of both vertebral arteries are normal. The vertebral system is codominant. Skeleton: There is multilevel facet hypertrophy and cervical osteophytosis. No advanced bony canal stenosis. Other neck: Orbits and globes are unremarkable. Salivary glands are normal. Normal appearance of the pharynx and larynx. Unremarkable thyroid gland. Upper chest: No pulmonary nodule or pleural effusion. CTA HEAD Intracranial internal carotid arteries: Bilateral atherosclerotic calcification without hemodynamically significant stenosis. 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 There is a right P-comm aneurysm projecting posteriorly and laterally, measuring 2.8 mm at its neck and 3 mm base to apex (series 7, image 101; series 8, image 104; series 9, image 64). Anterior cerebral arteries: Normal. Middle cerebral arteries: Normal. Posterior communicating arteries: As above, 3 mm aneurysm at the right P-comm origin. There is no left P-comm identified. Posterior cerebral arteries: Normal. Basilar artery: Normal. Vertebral arteries: Left dominant. Normal. Superior cerebellar arteries: Normal. Anterior inferior cerebellar arteries: Normal. Posterior inferior cerebellar arteries: Normal. Venous sinuses: Normal Delayed phase: No parenchymal contrast enhancement. IMPRESSION: 1. No acute intracranial abnormality. 2. No occlusion or severe stenosis of the arteries of the circle of  Willis. 3. Right P-comm aneurysm measuring 3 millimeters. 4. 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 5. Aortic atherosclerosis. No hemodynamically significant carotid or vertebral artery stenosis. Electronically Signed   By: Deatra Robinson M.D.   On: 07/05/2016 23:13    ____________________________________________   PROCEDURES  Procedure(s) performed: None  Procedures  Critical Care performed: No ____________________________________________   INITIAL IMPRESSION / ASSESSMENT AND PLAN / ED COURSE  Pertinent labs & imaging results that were available during my care of the patient were reviewed by me and considered in my medical decision making (see chart for details).  75 y.o. female with a history of vertigo, as well as hypertension, hyperlipidemia, diabetes, presenting with lightheadedness and dizziness with associated nausea. The patient has no focal neurologic findings on my examination with the exception of a mild tongue deviation. While her symptoms may be consistent with vertigo and I will treat her for such, I am concerned given her comorbidities and her age to rule out posterior stroke. I ordered a CT angiogram of the head and neck, and we'll treat the patient symptomatically. Disposition will depend on the outcome of the radio graphics studies.  ----------------------------------------- 11:18 PM on 07/05/2016 -----------------------------------------  The patient's vertiginous symptoms have completely resolved with meclizine and she is no longer nauseated.  Her headache is gone. She has been ambulatory and able to keep down by mouth. The patient's CT scan does not show any acute reasons why she would've been dizzy today. Plan discharge with close PMD follow-up. She understands return precautions as well as follow-up instructions. ____________________________________________  FINAL CLINICAL IMPRESSION(S) / ED DIAGNOSES  Final diagnoses:  Vertigo     Clinical Course      NEW MEDICATIONS STARTED DURING THIS VISIT:  New Prescriptions   MECLIZINE (ANTIVERT) 25 MG TABLET    Take 1 tablet (25 mg total) by mouth 3 (three) times daily as needed for dizziness.   ONDANSETRON (ZOFRAN ODT) 4 MG DISINTEGRATING TABLET    Take 1 tablet (4 mg total) by mouth every 8 (eight) hours as needed for nausea or vomiting.      Rockne Menghini, MD 07/05/16 [phone]

## 2016-07-10 ENCOUNTER — Ambulatory Visit (INDEPENDENT_AMBULATORY_CARE_PROVIDER_SITE_OTHER): Payer: Medicare Other | Admitting: Family Medicine

## 2016-07-10 ENCOUNTER — Encounter: Payer: Self-pay | Admitting: Family Medicine

## 2016-07-10 VITALS — BP 130/78 | HR 88 | Temp 98.4°F | Resp 18

## 2016-07-10 DIAGNOSIS — F329 Major depressive disorder, single episode, unspecified: Secondary | ICD-10-CM | POA: Diagnosis not present

## 2016-07-10 DIAGNOSIS — E785 Hyperlipidemia, unspecified: Secondary | ICD-10-CM | POA: Diagnosis not present

## 2016-07-10 DIAGNOSIS — I729 Aneurysm of unspecified site: Secondary | ICD-10-CM | POA: Insufficient documentation

## 2016-07-10 DIAGNOSIS — I10 Essential (primary) hypertension: Secondary | ICD-10-CM | POA: Diagnosis not present

## 2016-07-10 DIAGNOSIS — F32A Depression, unspecified: Secondary | ICD-10-CM

## 2016-07-10 DIAGNOSIS — R42 Dizziness and giddiness: Secondary | ICD-10-CM | POA: Diagnosis not present

## 2016-07-10 NOTE — Progress Notes (Signed)
Subjective:  HPI Pt is here for a ER follow from 07/05/16 for dizziness and nausea. 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 She reports that she has not been about to get in touch with them yet. She also reports that she was given meclizine and Zofran at the hospital and has seemed to help her symptoms.  Overall she is feeling better. Mild vertigo persists. COPD is stable and she has no other active complaints.  Prior to Admission medications   Medication Sig Start Date End Date Taking? Authorizing Provider  cetirizine (ZYRTEC) 10 MG tablet Take 10 mg by mouth daily.    Historical Provider, MD  cyclobenzaprine (FLEXERIL) 10 MG tablet Take 10 mg by mouth 3 (three) times daily as needed for muscle spasms.    Historical Provider, MD  DULERA 100-5 MCG/ACT AERO Inhale 2 puffs into the lungs 2 (two) times daily.  09/06/14   Historical Provider, MD  fluticasone (FLONASE) 50 MCG/ACT nasal spray Place into both nostrils daily.    Historical Provider, MD  furosemide (LASIX) 20 MG tablet TAKE 1 TABLET BY MOUTH DAILY 02/06/16   Maple Hudson., MD  gabapentin (NEURONTIN) 300 MG capsule TAKE 2 CAPSULES BY MOUTH 2 TIME A DAY 08/11/15   Hikeem Andersson Hulen Shouts., MD  HYDROcodone-acetaminophen [facility]) 10-325 MG tablet Take 1-2 tablets by mouth every 4 (four) hours as needed. 05/03/16   Lilas Diefendorf Hulen Shouts., MD  ipratropium-albuterol (DUONEB) 0.5-2.5 (3) MG/3ML SOLN Take 3 mLs by nebulization every 6 (six) hours as needed.     Historical Provider, MD  isosorbide mononitrate (IMDUR) 30 MG 24 hr tablet TAKE 1 TABLET (30 MG TOTAL) BY MOUTH ONCE DAILY. 06/13/15   Historical Provider, MD  levothyroxine (SYNTHROID, LEVOTHROID) 125 MCG tablet TAKE 1 TABLET BY MOUTH DAILY 03/15/16   Maple Hudson., MD  meclizine (ANTIVERT) 25 MG tablet Take 1 tablet (25 mg total) by mouth 3 (three) times daily as needed for  dizziness. 07/05/16   Rockne Menghini, MD  metFORMIN (GLUCOPHAGE) 1000 MG tablet Take 1,000 mg by mouth 2 (two) times daily with a meal.  11/16/14   Historical Provider, MD  MULTIPLE VITAMIN PO Take by mouth.    Historical Provider, MD  ondansetron (ZOFRAN ODT) 4 MG disintegrating tablet Take 1 tablet (4 mg total) by mouth every 8 (eight) hours as needed for nausea or vomiting. 07/05/16   Rockne Menghini, MD  ONE TOUCH ULTRA TEST test strip USE 1 STRIP 2 TIMES DAILY AND AS NEEDED 06/28/16   Maple Hudson., MD  pravastatin (PRAVACHOL) 10 MG tablet Take 1 tablet (10 mg total) by mouth daily. 05/15/16   Chiara Coltrin Hulen Shouts., MD  SPIRIVA HANDIHALER 18 MCG inhalation capsule  09/06/14   Historical Provider, MD    Patient Active Problem List   Diagnosis Date Noted  . Nonspecific abnormal finding 05/11/2015  . Allergic rhinitis 05/11/2015  . Anxiety 05/11/2015  . Bronchitis, chronic (HCC) 05/11/2015  . CCF (congestive cardiac failure) (HCC) 05/11/2015  . Clinical depression 05/11/2015  . Diabetes mellitus, type 2 (HCC) 05/11/2015  . 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 Essential (primary) hypertension 05/11/2015  . Acid reflux 05/11/2015  . HLD (hyperlipidemia) 05/11/2015  . Adult hypothyroidism 05/11/2015  . Cervical dysplasia, mild 05/11/2015  . Neuropathy (HCC) 05/11/2015  . Adiposity 05/11/2015  . Obstructive  apnea 05/11/2015  . Arthritis, degenerative 05/11/2015    Past Medical History:  Diagnosis Date  . Arthritis   . COPD (chronic obstructive pulmonary disease) (HCC)   . Diabetes mellitus without complication (HCC)   . Dysrhythmia   . Heart murmur   . History of orthopnea   . Hypertension   . Hypothyroidism   . Neuropathy (HCC)   . Oxygen dependent    3L  CONTINUOUS  . Pain CHRONIC BACK PAIN  . Shortness of breath dyspnea   . Wheezing     Social History   Social History  . Marital status: Married    Spouse name: N/A  . Number of  children: N/A  . Years of education: N/A   Occupational History  . Not on file.   Social History Main Topics  . Smoking status: Former Smoker    Packs/day: 0.50    Years: 15.00  . Smokeless tobacco: Never Used  . Alcohol use No  . Drug use: No  . Sexual activity: No   Other Topics Concern  . Not on file   Social History Narrative  . No narrative on file    Allergies  Allergen Reactions  . Codeine Nausea And Vomiting and Nausea Only    Review of Systems  Constitutional: Negative.   Eyes: Negative.   Respiratory: Negative.   Cardiovascular: Negative.   Gastrointestinal: Positive for nausea.  Genitourinary: Negative.   Skin: Negative.   Neurological: Positive for dizziness and headaches.  Endo/Heme/Allergies: Negative.   Psychiatric/Behavioral: Negative.     Immunization History  Administered Date(s) Administered  . Influenza, High Dose Seasonal PF 08/18/2015  . Pneumococcal Conjugate-13 08/24/2014  . Pneumococcal Polysaccharide-23 11/03/1999, 07/23/2012  . Tdap 07/23/2012  . Zoster 07/23/2012   Objective:  BP 130/78 (BP Location: Right Arm, Patient Position: Sitting, Cuff Size: Normal)   Pulse 88   Temp 98.4 F (36.9 C) (Oral)   Resp 18   Physical Exam  Constitutional: She is oriented to person, place, and time and well-developed, well-nourished, and in no distress.  HENT:  Head: Normocephalic and atraumatic.  Eyes: Conjunctivae and EOM are normal. Pupils are equal, round, and reactive to light.  Neck: Normal range of motion. Neck supple.  Cardiovascular: Normal rate, normal heart sounds and intact distal pulses.   Pulmonary/Chest: Effort normal and breath sounds normal.  Abdominal: Soft.  Musculoskeletal: Normal range of motion.  Neurological: She is alert and oriented to person, place, and time. No cranial nerve deficit. She exhibits normal muscle tone. Gait normal. Coordination normal.  Grossly nonfocal.  Skin: Skin is warm and dry.  Psychiatric:  Mood, memory, affect and judgment normal.    Lab Results  Component Value Date   WBC 11.5 (H) 07/05/2016   HGB 10.5 (L) 07/05/2016   HCT 32.6 (L) 07/05/2016   PLT 210 07/05/2016   GLUCOSE 145 (H) 07/05/2016   CHOL 152 05/03/2016   TRIG 99 05/03/2016   HDL 50 05/03/2016   LDLCALC 82 05/03/2016   TSH 1.300 05/03/2016   HGBA1C 5.4 05/03/2016    CMP     Component Value Date/Time   NA 139 07/05/2016 2121   NA 145 (H) 05/03/2016 1156   K 3.9 07/05/2016 2121   CL 100 (L) 07/05/2016 2121   CO2 33 (H) 07/05/2016 2121   GLUCOSE 145 (H) 07/05/2016 2121   BUN 15 07/05/2016 2121   BUN 12 05/03/2016 1156   CREATININE 0.56 07/05/2016 2121   CALCIUM 8.5 (L) 07/05/2016 2121  PROT 6.3 05/03/2016 1156   ALBUMIN 3.9 05/03/2016 1156   AST 12 05/03/2016 1156   ALT 5 05/03/2016 1156   ALKPHOS 71 05/03/2016 1156   BILITOT <0.2 05/03/2016 1156   GFRNONAA >60 07/05/2016 2121   GFRAA >60 07/05/2016 2121    Assessment and Plan :  1.Cerebral Aneurysm (HCC) Refer to Dr. Franky Machoabbell for this to evaluate. Keep BP under control.  - Ambulatory referral to Neurosurgery  2. Essential (primary) hypertension Controlled.  3. Vertigo Mild.  4. Clinical depression   5. HLD (hyperlipidemia) 6. Morbid obesity 7. COPD   HPI, Exam, and A&P Transcribed under the direction and in the presence of Laquana Villari L. Wendelyn BreslowGilbert Jr, MD  Electronically Signed: Dimas ChyleBrittany Byrd, CMA   Julieanne Mansonichard Malana Eberwein MD [facility] 07/10/2016 9:18 AM

## 2016-07-10 NOTE — Patient Instructions (Signed)
Flu vaccines at next OV.

## 2016-07-11 ENCOUNTER — Other Ambulatory Visit: Payer: Self-pay | Admitting: Family Medicine

## 2016-07-13 ENCOUNTER — Other Ambulatory Visit: Payer: Self-pay | Admitting: Family Medicine

## 2016-07-13 ENCOUNTER — Other Ambulatory Visit: Payer: Self-pay

## 2016-07-13 NOTE — Telephone Encounter (Signed)
This is Dr. Elisabeth CaraGilbert's pt. Okay to fill? Kristen DillonEmily Drozdowski, CMA

## 2016-07-15 DIAGNOSIS — J449 Chronic obstructive pulmonary disease, unspecified: Secondary | ICD-10-CM | POA: Diagnosis not present

## 2016-07-16 DIAGNOSIS — I671 Cerebral aneurysm, nonruptured: Secondary | ICD-10-CM | POA: Diagnosis not present

## 2016-07-30 ENCOUNTER — Other Ambulatory Visit: Payer: Self-pay | Admitting: Family Medicine

## 2016-07-30 ENCOUNTER — Ambulatory Visit
Admission: RE | Admit: 2016-07-30 | Discharge: 2016-07-30 | Disposition: A | Discharge status: Home / Self Care | Payer: Medicare Other | Source: Ambulatory Visit | Attending: Family Medicine | Admitting: Family Medicine

## 2016-07-30 DIAGNOSIS — Z1231 Encounter for screening mammogram for malignant neoplasm of breast: Secondary | ICD-10-CM

## 2016-07-30 DIAGNOSIS — R0902 Hypoxemia: Secondary | ICD-10-CM | POA: Diagnosis not present

## 2016-07-30 DIAGNOSIS — E1165 Type 2 diabetes mellitus with hyperglycemia: Secondary | ICD-10-CM | POA: Diagnosis not present

## 2016-07-30 DIAGNOSIS — E784 Other hyperlipidemia: Secondary | ICD-10-CM | POA: Diagnosis not present

## 2016-07-30 DIAGNOSIS — J8410 Pulmonary fibrosis, unspecified: Secondary | ICD-10-CM | POA: Diagnosis not present

## 2016-07-30 DIAGNOSIS — I10 Essential (primary) hypertension: Secondary | ICD-10-CM | POA: Diagnosis not present

## 2016-08-01 ENCOUNTER — Ambulatory Visit: Payer: Medicare Other

## 2016-08-14 DIAGNOSIS — J449 Chronic obstructive pulmonary disease, unspecified: Secondary | ICD-10-CM | POA: Diagnosis not present

## 2016-08-28 ENCOUNTER — Ambulatory Visit (INDEPENDENT_AMBULATORY_CARE_PROVIDER_SITE_OTHER): Payer: Medicare Other | Admitting: Family Medicine

## 2016-08-28 VITALS — BP 102/56 | HR 88 | Temp 98.8°F | Resp 18 | Wt 241.0 lb

## 2016-08-28 DIAGNOSIS — Z23 Encounter for immunization: Secondary | ICD-10-CM | POA: Diagnosis not present

## 2016-08-28 DIAGNOSIS — I10 Essential (primary) hypertension: Secondary | ICD-10-CM

## 2016-08-28 DIAGNOSIS — E038 Other specified hypothyroidism: Secondary | ICD-10-CM

## 2016-08-28 DIAGNOSIS — M5136 Other intervertebral disc degeneration, lumbar region: Secondary | ICD-10-CM

## 2016-08-28 DIAGNOSIS — I729 Aneurysm of unspecified site: Secondary | ICD-10-CM | POA: Diagnosis not present

## 2016-08-28 DIAGNOSIS — G629 Polyneuropathy, unspecified: Secondary | ICD-10-CM | POA: Diagnosis not present

## 2016-08-28 MED ORDER — HYDROCODONE-ACETAMINOPHEN 10-325 MG PO TABS: 1.0000 | ORAL_TABLET | ORAL | 0 refills | Status: DC | PRN

## 2016-08-28 NOTE — Progress Notes (Signed)
Kristen Schroeder  MRN: [id number] DOB: Sep 27, 1941  Subjective:  HPI  Patient is here for follow up  Hypertension: patient checks her b/p sometimes and last 2 readings were 150/90 and 140/72 . She is taking Lisinopril daily, HCTZ was stopped in February due to B/P readings been low. Sometimes gets chest pain-seems gas related because it resolves after gas goes away.  BP Readings from Last 3 Encounters:  08/28/16 (!) 102/56  07/10/16 130/78  07/05/16 111/66   DDD: she is taking Norco 2 tablets twice daily.  Patient Active Problem List   Diagnosis Date Noted  . Aneurysm (HCC) 07/10/2016  . Nonspecific abnormal finding 05/11/2015  . Allergic rhinitis 05/11/2015  . Anxiety 05/11/2015  . Bronchitis, chronic (HCC) 05/11/2015  . CCF (congestive cardiac failure) (HCC) 05/11/2015  . Clinical depression 05/11/2015  . Diabetes mellitus, type 2 (HCC) 05/11/2015  . 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 Essential (primary) hypertension 05/11/2015  . Acid reflux 05/11/2015  . HLD (hyperlipidemia) 05/11/2015  . Adult hypothyroidism 05/11/2015  . Cervical dysplasia, mild 05/11/2015  . Neuropathy (HCC) 05/11/2015  . Adiposity 05/11/2015  . Obstructive apnea 05/11/2015  . Arthritis, degenerative 05/11/2015    Past Medical History:  Diagnosis Date  . Arthritis   . COPD (chronic obstructive pulmonary disease) (HCC)   . Diabetes mellitus without complication (HCC)   . Dysrhythmia   . Heart murmur   . History of orthopnea   . Hypertension   . Hypothyroidism   . Neuropathy (HCC)   . Oxygen dependent    3L  CONTINUOUS  . Pain CHRONIC BACK PAIN  . Shortness of breath dyspnea   . Wheezing     Social History   Social History  . Marital status: Married    Spouse name: N/A  . Number of children: N/A  . Years of education: N/A   Occupational History  . Not on file.   Social History Main Topics  . Smoking status: Former Smoker    Packs/day: 0.50    Years:  15.00  . Smokeless tobacco: Never Used  . Alcohol use No  . Drug use: No  . Sexual activity: No   Other Topics Concern  . Not on file   Social History Narrative  . No narrative on file    Outpatient Encounter Prescriptions as of 08/28/2016  Medication Sig Note  . cetirizine (ZYRTEC) 10 MG tablet Take 10 mg by mouth daily.   . cyclobenzaprine (FLEXERIL) 10 MG tablet Take 10 mg by mouth 3 (three) times daily as needed for muscle spasms.   . DULERA 100-5 MCG/ACT AERO Inhale 2 puffs into the lungs 2 (two) times daily.  10/19/2014: Received from: External Pharmacy  . fluticasone (FLONASE) 50 MCG/ACT nasal spray Place into both nostrils daily.   . furosemide (LASIX) 20 MG tablet TAKE 1 TABLET BY MOUTH DAILY   . gabapentin (NEURONTIN) 300 MG capsule TAKE 2 CAPSULES BY MOUTH 2 TIME A DAY   . HYDROcodone-acetaminophen (NORCO) 10-325 MG tablet Take 1-2 tablets by mouth every 4 (four) hours as needed.   Marland Kitchen. ipratropium-albuterol (DUONEB) 0.5-2.5 (3) MG/3ML SOLN Take 3 mLs by nebulization every 6 (six) hours as needed.    . isosorbide mononitrate (IMDUR) 30 MG 24 hr tablet TAKE 1 TABLET (30 MG TOTAL) BY MOUTH ONCE DAILY. 06[facility]15/2015: [facility]  . levothyroxine (SYNTHROID, LEVOTHROID) 125 MCG tablet TAKE 1 TABLET BY MOUTH DAILY   . lisinopril (PRINIVIL,ZESTRIL) 10 MG tablet  Take 10 mg by mouth daily.   . meclizine (ANTIVERT) 25 MG tablet Take 1 tablet (25 mg total) by mouth 3 (three) times daily as needed for dizziness.   . metFORMIN (GLUCOPHAGE) 1000 MG tablet TAKE 1 TABLET BY MOUTH TWICE A DAY   . MULTIPLE VITAMIN PO Take by mouth. 05/11/2015: Received from: [facility]  . ondansetron (ZOFRAN ODT) 4 MG disintegrating tablet Take 1 tablet (4 mg total) by mouth every 8 (eight) hours as needed for nausea or vomiting.   . ONE TOUCH ULTRA TEST test strip USE 1 STRIP 2 TIMES DAILY AND AS NEEDED   . pravastatin (PRAVACHOL) 10 MG tablet Take 1 tablet (10 mg  total) by mouth daily.   Marland Kitchen SPIRIVA HANDIHALER 18 MCG inhalation capsule  10/19/2014: Received from: External Pharmacy   No facility-administered encounter medications on file as of 08/28/2016.     Allergies  Allergen Reactions  . Codeine Nausea And Vomiting and Nausea Only    Review of Systems  Constitutional: Positive for malaise/fatigue.  Eyes: Negative.   Respiratory: Positive for shortness of breath and wheezing. Negative for cough.   Cardiovascular: Positive for chest pain.  Musculoskeletal: Positive for back pain, joint pain and myalgias.  Neurological: Positive for dizziness and tingling (neuropathy sensation).  Endo/Heme/Allergies: Negative.   Psychiatric/Behavioral: Negative.    Objective:  BP (!) 102/56   Pulse 88   Temp 98.8 F (37.1 C)   Resp 18   Wt 241 lb (109.3 kg)   SpO2 98% Comment: on 4 liters of oxygen  BMI 37.75 kg/m   Physical Exam  Constitutional: She is oriented to person, place, and time and well-developed, well-nourished, and in no distress.  HENT:  Head: Normocephalic and atraumatic.  Eyes: Conjunctivae are normal. Pupils are equal, round, and reactive to light.  Neck: Normal range of motion. Neck supple.  Cardiovascular: Normal rate, regular rhythm, normal heart sounds and intact distal pulses.   Pulmonary/Chest: Effort normal and breath sounds normal. No respiratory distress. She has no wheezes.  Abdominal: Soft.  Musculoskeletal: She exhibits no edema or tenderness.  Neurological: She is alert and oriented to person, place, and time.  Skin: Skin is warm and dry.  Psychiatric: Mood, memory, affect and judgment normal.    Assessment and Plan :  1. Essential (primary) hypertension Stable. Advised patient to bring her b/p machine on the next visit so we can check it.  2. Aneurysm (HCC) Followed up with neurosurgeon. Follow as needed.  3. DDD (degenerative disc disease), lumbar Refill x 3. - HYDROcodone-acetaminophen (NORCO) 10-325 MG  tablet; Take 1-2 tablets by mouth every 4 (four) hours as needed.  Dispense: 200 tablet; Refill: 0 - HYDROcodone-acetaminophen (NORCO) 10-325 MG tablet; Take 1-2 tablets by mouth every 4 (four) hours as needed.  Dispense: 200 tablet; Refill: 0  4. Neuropathy (HCC) Stable.  5. Other specified hypothyroidism Stable on last check.  6. Need for influenza vaccination - Flu vaccine HIGH DOSE PF (Fluzone High dose) 7.COPD HPI, Exam and A&P transcribed under direction and in the presence of Julieanne Manson, MD. I have done the exam and reviewed the chart and it is accurate to the best of my knowledge. Julieanne Manson M.D. [facility]

## 2016-08-29 ENCOUNTER — Ambulatory Visit: Payer: Medicare Other | Admitting: Family Medicine

## 2016-09-10 ENCOUNTER — Telehealth: Payer: Self-pay | Admitting: Family Medicine

## 2016-09-10 MED ORDER — AZITHROMYCIN 250 MG PO TABS: ORAL_TABLET | ORAL | 0 refills | Status: DC

## 2016-09-10 NOTE — Telephone Encounter (Signed)
Pt stated that she thinks she is starting to come down with a head cold and is requesting a X pack to be sent to [facility]. I advised that we don't treat over the phone and the pt requested that a message be sent. Pt stated that she has cough & congestion and when I tired to get more details pt continued to state she is getting a cold.l Please advise. Thanks TNP

## 2016-09-10 NOTE — Telephone Encounter (Signed)
Please review-aa 

## 2016-09-10 NOTE — Telephone Encounter (Signed)
Pt advised, RX sent in-aa 

## 2016-09-10 NOTE — Telephone Encounter (Signed)
Zpak with refill due to her COPD.

## 2016-09-14 DIAGNOSIS — J449 Chronic obstructive pulmonary disease, unspecified: Secondary | ICD-10-CM | POA: Diagnosis not present

## 2016-09-18 ENCOUNTER — Other Ambulatory Visit: Payer: Self-pay

## 2016-09-18 MED ORDER — AZITHROMYCIN 250 MG PO TABS: ORAL_TABLET | ORAL | 0 refills | Status: DC

## 2016-09-18 NOTE — Telephone Encounter (Signed)
Ok to refill Zpak °

## 2016-09-19 DIAGNOSIS — R0602 Shortness of breath: Secondary | ICD-10-CM | POA: Diagnosis not present

## 2016-09-26 ENCOUNTER — Other Ambulatory Visit: Payer: Self-pay | Admitting: Family Medicine

## 2016-10-09 ENCOUNTER — Ambulatory Visit: Payer: Medicare Other

## 2016-10-14 DIAGNOSIS — J449 Chronic obstructive pulmonary disease, unspecified: Secondary | ICD-10-CM | POA: Diagnosis not present

## 2016-10-17 ENCOUNTER — Ambulatory Visit: Payer: Medicare Other

## 2016-10-17 ENCOUNTER — Ambulatory Visit (INDEPENDENT_AMBULATORY_CARE_PROVIDER_SITE_OTHER): Payer: Medicare Other

## 2016-10-17 VITALS — BP 144/80 | HR 104 | Temp 98.7°F | Ht 67.0 in | Wt 239.4 lb

## 2016-10-17 DIAGNOSIS — R0602 Shortness of breath: Secondary | ICD-10-CM | POA: Diagnosis not present

## 2016-10-17 DIAGNOSIS — Z0000 Encounter for general adult medical examination without abnormal findings: Secondary | ICD-10-CM | POA: Diagnosis not present

## 2016-10-17 DIAGNOSIS — R05 Cough: Secondary | ICD-10-CM | POA: Diagnosis not present

## 2016-10-17 DIAGNOSIS — J449 Chronic obstructive pulmonary disease, unspecified: Secondary | ICD-10-CM | POA: Diagnosis not present

## 2016-10-17 DIAGNOSIS — J9611 Chronic respiratory failure with hypoxia: Secondary | ICD-10-CM | POA: Diagnosis not present

## 2016-10-17 NOTE — Progress Notes (Signed)
Subjective:   Kristen Schroeder is a 75 y.o. female who presents for Medicare Annual (Subsequent) preventive examination.  Review of Systems:  N/A  Cardiac Risk Factors include: advanced age (>6855men, 17>65 women);diabetes mellitus;dyslipidemia;hypertension;obesity (BMI >30kg/m2)     Objective:     Vitals: BP (!) 144/80 (BP Location: Right Arm)   Pulse (!) 104   Temp 98.7 F (37.1 C) (Oral)   Ht 5\' 7"  (1.702 m)   Wt 239 lb 7 oz (108.6 kg)   BMI 37.50 kg/m   Body mass index is 37.5 kg/m.   Tobacco History  Smoking Status  . Former Smoker  . Packs/day: 0.50  . Years: 15.00  Smokeless Tobacco  . Never Used     Counseling given: Not Answered   Past Medical History:  Diagnosis Date  . Arthritis   . COPD (chronic obstructive pulmonary disease) (HCC)   . Diabetes mellitus without complication (HCC)   . Dysrhythmia   . Heart murmur   . History of orthopnea   . Hypertension   . Hypothyroidism   . Neuropathy (HCC)   . Oxygen dependent    3L  CONTINUOUS  . Pain CHRONIC BACK PAIN  . Shortness of breath dyspnea   . Wheezing    Past Surgical History:  Procedure Laterality Date  . ABDOMINAL HYSTERECTOMY    . CATARACT EXTRACTION W/PHACO Left 07/03/2016   Procedure: CATARACT EXTRACTION PHACO AND INTRAOCULAR LENS PLACEMENT (IOC);  Surgeon: Galen ManilaWilliam Porfilio, MD;  Location: [facility] ORS;  Service: Ophthalmology;  Laterality: Left;  Lot: [id number] H US: 00:40.1 AP%: 17.4 CDE:6.94  . TUBAL LIGATION     Family History  Problem Relation Age of Onset  . Heart disease Mother   . Drug abuse Other   . Hypertension Other    History  Sexual Activity  . Sexual activity: No    Outpatient Encounter Prescriptions as of 10/17/2016  Medication Sig  . cetirizine (ZYRTEC) 10 MG tablet Take 10 mg by mouth daily.  . cyclobenzaprine (FLEXERIL) 10 MG tablet Take 10 mg by mouth 3 (three) times daily as needed for muscle spasms.  . DULERA 100-5 MCG/ACT AERO Inhale 2 puffs into the lungs 2  (two) times daily.   . fluticasone (FLONASE) 50 MCG/ACT nasal spray Place into both nostrils daily.   . furosemide (LASIX) 20 MG tablet TAKE 1 TABLET BY MOUTH DAILY  . gabapentin (NEURONTIN) 300 MG capsule TAKE 2 CAPSULES BY MOUTH 2 TIME A DAY  . HYDROcodone-acetaminophen (NORCO) 10-325 MG tablet Take 1-2 tablets by mouth every 4 (four) hours as needed.  Marland Kitchen. ipratropium-albuterol (DUONEB) 0.5-2.5 (3) MG/3ML SOLN Take 3 mLs by nebulization every 6 (six) hours as needed.   . isosorbide mononitrate (IMDUR) 30 MG 24 hr tablet TAKE 1 TABLET (30 MG TOTAL) BY MOUTH ONCE DAILY.  Marland Kitchen. levothyroxine (SYNTHROID, LEVOTHROID) 125 MCG tablet TAKE 1 TABLET BY MOUTH DAILY  . lisinopril (PRINIVIL,ZESTRIL) 10 MG tablet Take 10 mg by mouth daily.  . meclizine (ANTIVERT) 25 MG tablet Take 1 tablet (25 mg total) by mouth 3 (three) times daily as needed for dizziness.  . metFORMIN (GLUCOPHAGE) 1000 MG tablet TAKE 1 TABLET BY MOUTH TWICE A DAY  . MULTIPLE VITAMIN PO Take by mouth.  . ondansetron (ZOFRAN ODT) 4 MG disintegrating tablet Take 1 tablet (4 mg total) by mouth every 8 (eight) hours as needed for nausea or vomiting.  . ONE TOUCH ULTRA TEST test strip USE 1 STRIP 2 TIMES DAILY AND AS NEEDED  .  pravastatin (PRAVACHOL) 10 MG tablet Take 1 tablet (10 mg total) by mouth daily.  Marland Kitchen. SPIRIVA HANDIHALER 18 MCG inhalation capsule   . azithromycin (ZITHROMAX Z-PAK) 250 MG tablet As directed (Patient not taking: Reported on 10/17/2016)  . HYDROcodone-acetaminophen (NORCO) 10-325 MG tablet Take 1-2 tablets by mouth every 4 (four) hours as needed. (Patient not taking: Reported on 10/17/2016)   No facility-administered encounter medications on file as of 10/17/2016.     Activities of Daily Living In your present state of health, do you have any difficulty performing the following activities: 10/17/2016 07/10/2016  Hearing? N N  Vision? N N  Difficulty concentrating or making decisions? N N  Walking or climbing stairs? Y Y    Dressing or bathing? N N  Doing errands, shopping? N Y  Quarry managerreparing Food and eating ? N -  Using the Toilet? N -  In the past six months, have you accidently leaked urine? N -  Do you have problems with loss of bowel control? N -  Managing your Medications? N -  Managing your Finances? Y -  Housekeeping or managing your Housekeeping? N -  Some recent data might be hidden    Patient Care Team: Maple Hudsonichard L Cristel Rail Jr., MD as PCP - General (Family Medicine) Galen ManilaWilliam Porfilio, MD as Referring Physician (Ophthalmology) Alwyn Peawayne D Callwood, MD as Consulting Physician (Cardiology) Louisa Secondichard M Kahn, MD as Consulting Physician (Pulmonary Disease)    Assessment:     Exercise Activities and Dietary recommendations Current Exercise Habits: Home exercise routine, Type of exercise: stretching, Time (Minutes): 15, Frequency (Times/Week): 3, Weekly Exercise (Minutes/Week): 45  Goals    . Increase water intake          Starting 10/17/16, I will increase my water intake from 5 glasses a day to 6 glasses.      Fall Risk Fall Risk  10/17/2016 07/10/2016 06/15/2015  Falls in the past year? No No No   Depression Screen PHQ 2/9 Scores 10/17/2016 07/10/2016 06/15/2015  PHQ - 2 Score 0 1 2  PHQ- 9 Score - - 7     Cognitive Function     6CIT Screen 10/17/2016  What Year? 0 points  What month? 0 points  What time? 0 points  Count back from 20 4 points  Months in reverse 0 points  Repeat phrase 2 points  Total Score 6    Immunization History  Administered Date(s) Administered  . Influenza, High Dose Seasonal PF 08/18/2015, 08/28/2016  . Pneumococcal Conjugate-13 08/24/2014  . Pneumococcal Polysaccharide-23 11/03/1999, 07/23/2012  . Tdap 07/23/2012  . Zoster 07/23/2012   Screening Tests Health Maintenance  Topic Date Due  . FOOT EXAM  12/25/2016 (Originally 06/18/1951)  . HEMOGLOBIN A1C  11/02/2016  . OPHTHALMOLOGY EXAM  05/18/2017  . COLONOSCOPY  04/06/2018  . TETANUS/TDAP  07/23/2022  .  INFLUENZA VACCINE  Completed  . DEXA SCAN  Completed  . ZOSTAVAX  Completed  . PNA vac Low Risk Adult  Completed      Plan:  I have personally reviewed and addressed the Medicare Annual Wellness questionnaire and have noted the following in the patient's chart:  A. Medical and social history B. Use of alcohol, tobacco or illicit drugs  C. Current medications and supplements D. Functional ability and status E.  Nutritional status F.  Physical activity G. Advance directives H. List of other physicians I.  Hospitalizations, surgeries, and ER visits in previous 12 months J.  Vitals K. Screenings such as hearing and vision  if needed, cognitive and depression L. Referrals and appointments - none  In addition, I have reviewed and discussed with patient certain preventive protocols, quality metrics, and best practice recommendations. A written personalized care plan for preventive services as well as general preventive health recommendations were provided to patient.  See attached scanned questionnaire for additional information.   Signed,  Hyacinth Meeker, LPN Nurse Health Advisor   MD Recommendations: follow up on diabetic foot exam at [facility] on 12/25/16. I have reviewed the health advisors note, was  available for consultation and I agree with documentation and plan. Julieanne Manson MD [facility]

## 2016-10-17 NOTE — Patient Instructions (Signed)
[name] , Thank you for taking time to come for your Medicare Wellness Visit. I appreciate your ongoing commitment to your health goals. Please review the following plan we discussed and let me know if I can assist you in the future.   These are the goals we discussed: Goals    . Increase water intake          Starting 10/17/16, I will increase my water intake from 5 glasses a day to 6 glasses.       This is a list of the screening recommended for you and due dates:  Health Maintenance  Topic Date Due  . Complete foot exam   12/25/2016*  . Hemoglobin A1C  11/02/2016  . Eye exam for diabetics  05/18/2017  . Colon Cancer Screening  04/06/2018  . Tetanus Vaccine  07/23/2022  . Flu Shot  Completed  . DEXA scan (bone density measurement)  Completed  . Shingles Vaccine  Completed  . Pneumonia vaccines  Completed  *Topic was postponed. The date shown is not the original due date.   Preventive Care for Adults  A healthy lifestyle and preventive care can promote health and wellness. Preventive health guidelines for adults include the following key practices.  . A routine yearly physical is a good way to check with your health care provider about your health and preventive screening. It is a chance to share any concerns and updates on your health and to receive a thorough exam.  . Visit your dentist for a routine exam and preventive care every 6 months. Brush your teeth twice a day and floss once a day. Good oral hygiene prevents tooth decay and gum disease.  . The frequency of eye exams is based on your age, health, family medical history, use  of contact lenses, and other factors. Follow your health care provider's ecommendations for frequency of eye exams.  . Eat a healthy diet. Foods like vegetables, fruits, whole grains, low-fat dairy products, and lean protein foods contain the nutrients you need without too many calories. Decrease your intake of foods high in solid fats, added  sugars, and salt. Eat the right amount of calories for you. Get information about a proper diet from your health care provider, if necessary.  . Regular physical exercise is one of the most important things you can do for your health. Most adults should get at least 150 minutes of moderate-intensity exercise (any activity that increases your heart rate and causes you to sweat) each week. In addition, most adults need muscle-strengthening exercises on 2 or more days a week.  Silver Sneakers may be a benefit available to you. To determine eligibility, you may visit the website: www.silversneakers.com or contact program at 94027425061-515-280-4001 Mon-Fri between 8AM-8PM.   . Maintain a healthy weight. The body mass index (BMI) is a screening tool to identify possible weight problems. It provides an estimate of body fat based on height and weight. Your health care provider can find your BMI and can help you achieve or maintain a healthy weight.   For adults 20 years and older: ? A BMI below 18.5 is considered underweight. ? A BMI of 18.5 to 24.9 is normal. ? A BMI of 25 to 29.9 is considered overweight. ? A BMI of 30 and above is considered obese.   . Maintain normal blood lipids and cholesterol levels by exercising and minimizing your intake of saturated fat. Eat a balanced diet with plenty of fruit and vegetables. Blood tests for  lipids and cholesterol should begin at age 62 and be repeated every 5 years. If your lipid or cholesterol levels are high, you are over 50, or you are at high risk for heart disease, you may need your cholesterol levels checked more frequently. Ongoing high lipid and cholesterol levels should be treated with medicines if diet and exercise are not working.  . If you smoke, find out from your health care provider how to quit. If you do not use tobacco, please do not start.  . If you choose to drink alcohol, please do not consume more than 2 drinks per day. One drink is considered to  be 12 ounces (355 mL) of beer, 5 ounces (148 mL) of wine, or 1.5 ounces (44 mL) of liquor.  . If you are 14-80 years old, ask your health care provider if you should take aspirin to prevent strokes.  . Use sunscreen. Apply sunscreen liberally and repeatedly throughout the day. You should seek shade when your shadow is shorter than you. Protect yourself by wearing long sleeves, pants, a wide-brimmed hat, and sunglasses year round, whenever you are outdoors.  . Once a month, do a whole body skin exam, using a mirror to look at the skin on your back. Tell your health care provider of new moles, moles that have irregular borders, moles that are larger than a pencil eraser, or moles that have changed in shape or color.

## 2016-10-25 ENCOUNTER — Ambulatory Visit (INDEPENDENT_AMBULATORY_CARE_PROVIDER_SITE_OTHER): Payer: Medicare Other | Admitting: Family Medicine

## 2016-10-25 ENCOUNTER — Encounter: Payer: Self-pay | Admitting: Family Medicine

## 2016-10-25 VITALS — BP 122/80 | HR 98 | Temp 98.5°F | Resp 17 | Wt 238.4 lb

## 2016-10-25 DIAGNOSIS — R51 Headache: Secondary | ICD-10-CM

## 2016-10-25 DIAGNOSIS — I10 Essential (primary) hypertension: Secondary | ICD-10-CM | POA: Diagnosis not present

## 2016-10-25 DIAGNOSIS — R519 Headache, unspecified: Secondary | ICD-10-CM

## 2016-10-25 NOTE — Patient Instructions (Signed)
Come in for a nurse bp check next week. May use Tylenol for headaches. Consider a wrist bp cuff for better fit.

## 2016-10-25 NOTE — Progress Notes (Signed)
Subjective:     Patient ID: Kristen Schroeder, female   DOB: Jul 01, 1941, 75 y.o.   MRN: [id number]  HPI  Chief Complaint  Patient presents with  . Headache    Patient comes in offie today with concerns of headache and light headiness for the past two days. Patient reports that she has been feeling off balance and has had elevated blood pressure readings at home. Patient reports that she has had intermittent chest pain as well but is relieved by burping.   She comes in today with her electronic bp cuff. 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 Reports she has a residual posterior mild pressure headache. Admits to worrying about bp elevation and her schizophrenic son.   Review of Systems     Objective:   Physical Exam  Constitutional: She appears well-developed and well-nourished. No distress.  Eyes: EOM are normal.  Pupils equal but diminished reactivity to light due to right cataract and left aphakic  Cardiovascular: Normal rate and regular rhythm.   Pulmonary/Chest: Breath sounds normal.  Musculoskeletal:  Grip strength 5/5 symmetrically  Neurological: Coordination (finger to nose and heel to shin WNL) normal.       Assessment:    1. Acute nonintractable headache, unspecified headache type  2. Essential (primary) hypertension:stable. Home bp cuff can not be placed snugly on her arm to get accurate reading  due to adipose tissue.    Plan:    Return for nurse bp check in the next week. Suggested trying a wrist bp cuff for better fit.

## 2016-11-01 ENCOUNTER — Other Ambulatory Visit: Payer: Self-pay | Admitting: Family Medicine

## 2016-11-01 NOTE — Telephone Encounter (Signed)
Please review. Thank you. sd  

## 2016-11-14 DIAGNOSIS — J449 Chronic obstructive pulmonary disease, unspecified: Secondary | ICD-10-CM | POA: Diagnosis not present

## 2016-12-06 ENCOUNTER — Telehealth: Payer: Self-pay

## 2016-12-06 NOTE — Telephone Encounter (Signed)
Patient called to report taking Aspirin 81 mg (2) at 10 am, Tylenol 325 mg (3) at 12:30 pm and then took her antibiotic at 3 pm. Patient denies any unusual symptoms other than upper respiratory symptoms that she is being treated for. Patient just wanted to make sure she didn't take to much medication to close together. CB# [phone]

## 2016-12-07 ENCOUNTER — Ambulatory Visit (INDEPENDENT_AMBULATORY_CARE_PROVIDER_SITE_OTHER): Payer: Medicare Other | Admitting: Physician Assistant

## 2016-12-07 ENCOUNTER — Encounter: Payer: Self-pay | Admitting: Physician Assistant

## 2016-12-07 VITALS — BP 136/66 | HR 92 | Temp 98.1°F | Resp 16 | Wt 241.0 lb

## 2016-12-07 DIAGNOSIS — K219 Gastro-esophageal reflux disease without esophagitis: Secondary | ICD-10-CM | POA: Diagnosis not present

## 2016-12-07 DIAGNOSIS — R079 Chest pain, unspecified: Secondary | ICD-10-CM | POA: Diagnosis not present

## 2016-12-07 DIAGNOSIS — J418 Mixed simple and mucopurulent chronic bronchitis: Secondary | ICD-10-CM | POA: Diagnosis not present

## 2016-12-07 DIAGNOSIS — R0602 Shortness of breath: Secondary | ICD-10-CM | POA: Diagnosis not present

## 2016-12-07 MED ORDER — OMEPRAZOLE 40 MG PO CPDR: 40.0000 mg | DELAYED_RELEASE_CAPSULE | Freq: Every day | ORAL | 3 refills | Status: DC

## 2016-12-07 NOTE — Telephone Encounter (Signed)
Addressed at office visit today-aa

## 2016-12-07 NOTE — Telephone Encounter (Signed)
Please review if able to Dr gilbert's patient-aa

## 2016-12-07 NOTE — Progress Notes (Signed)
Patient: Kristen Schroeder Female    DOB: 04/16/1941   76 y.o.   MRN: [id number] Visit Date: 12/07/2016  Today's Provider: Margaretann Loveless, PA-C   Chief Complaint  Patient presents with  . Chest Pain    shortness of breath   Subjective:    HPI Patient here today c/o chest pain and shortness of breath times 3 days. Patient reports that her symptoms have improved some. Patient reports that she took 2 baby aspirin, 2 tablets of azithromycin and 3 tablets of Tylenol due to headache, along with her other medications yesterday.   Patient reports she felt like she was coming down with a cold. Patient reports she increased her oxygen to 3 liters during the day and decreased back to 2 liters at bed time. Patient reports she has been using her Dulera, Duoneb, and Spiriva as indicated. Patient reports moderate improvement after treatments.   Patient reports that last night she was unable to sleep due to sweating a lot. Patient is concerned that she took too many medications at once yesterday. She has not taken any antibiotic or tylenol today. She is on 4L of oxygen today in the office.    Allergies  Allergen Reactions  . Codeine Nausea And Vomiting and Nausea Only   Patient Active Problem List   Diagnosis Date Noted  . Aneurysm (HCC) 07/10/2016  . Nonspecific abnormal finding 05/11/2015  . Allergic rhinitis 05/11/2015  . Anxiety 05/11/2015  . Bronchitis, chronic (HCC) 05/11/2015  . CCF (congestive cardiac failure) (HCC) 05/11/2015  . Clinical depression 05/11/2015  . Diabetes mellitus, type 2 (HCC) 05/11/2015  . 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 Essential (primary) hypertension 05/11/2015  . Acid reflux 05/11/2015  . HLD (hyperlipidemia) 05/11/2015  . Adult hypothyroidism 05/11/2015  . Cervical dysplasia, mild 05/11/2015  . Neuropathy (HCC) 05/11/2015  . Adiposity 05/11/2015  . Obstructive apnea 05/11/2015  . Arthritis, degenerative 05/11/2015      Current Outpatient Prescriptions:  .  azithromycin (ZITHROMAX Z-PAK) 250 MG tablet, Take 2 tablets first day and then take 1 tablet daily for 4 days, Disp: 6 tablet, Rfl: 3 .  cetirizine (ZYRTEC) 10 MG tablet, Take 10 mg by mouth daily., Disp: , Rfl:  .  cyclobenzaprine (FLEXERIL) 10 MG tablet, Take 10 mg by mouth 3 (three) times daily as needed for muscle spasms., Disp: , Rfl:  .  DULERA 100-5 MCG/ACT AERO, Inhale 2 puffs into the lungs 2 (two) times daily. , Disp: , Rfl: 4 .  fluticasone (FLONASE) 50 MCG/ACT nasal spray, Place into both nostrils daily. , Disp: , Rfl:  .  furosemide (LASIX) 20 MG tablet, TAKE 1 TABLET BY MOUTH DAILY, Disp: 90 tablet, Rfl: 3 .  gabapentin (NEURONTIN) 300 MG capsule, TAKE 2 CAPSULES BY MOUTH 2 TIME A DAY, Disp: 360 capsule, Rfl: 3 .  HYDROcodone-acetaminophen (NORCO) 10-325 MG tablet, Take 1-2 tablets by mouth every 4 (four) hours as needed., Disp: 200 tablet, Rfl: 0 .  ipratropium-albuterol (DUONEB) 0.5-2.5 (3) MG/3ML SOLN, Take 3 mLs by nebulization every 6 (six) hours as needed. , Disp: , Rfl:  .  isosorbide mononitrate (IMDUR) 30 MG 24 hr tablet, TAKE 1 TABLET (30 MG TOTAL) BY MOUTH ONCE DAILY., Disp: , Rfl:  .  levothyroxine (SYNTHROID, LEVOTHROID) 125 MCG tablet, TAKE 1 TABLET BY MOUTH DAILY, Disp: 90 tablet, Rfl: 3 .  lisinopril (PRINIVIL,ZESTRIL) 10 MG tablet, Take 10 mg by mouth daily., Disp: , Rfl:  .  metFORMIN (GLUCOPHAGE) 1000 MG tablet, TAKE 1 TABLET BY MOUTH TWICE A DAY, Disp: 180 tablet, Rfl: 4 .  MULTIPLE VITAMIN PO, Take by mouth., Disp: , Rfl:  .  ondansetron (ZOFRAN ODT) 4 MG disintegrating tablet, Take 1 tablet (4 mg total) by mouth every 8 (eight) hours as needed for nausea or vomiting., Disp: 20 tablet, Rfl: 0 .  ONE TOUCH ULTRA TEST test strip, USE 1 STRIP 2 TIMES DAILY AND AS NEEDED, Disp: 100 each, Rfl: 12 .  pravastatin (PRAVACHOL) 10 MG tablet, Take 1 tablet (10 mg total) by mouth daily., Disp: 90 tablet, Rfl: 3 .  SPIRIVA  HANDIHALER 18 MCG inhalation capsule, Place 18 mcg into inhaler and inhale as needed. , Disp: , Rfl: 1  Review of Systems  Constitutional: Positive for diaphoresis and fatigue. Negative for chills and fever.  HENT: Positive for congestion and rhinorrhea. Negative for ear pain, postnasal drip, sinus pain, sinus pressure, sneezing, sore throat, tinnitus and trouble swallowing.   Respiratory: Positive for cough, shortness of breath and wheezing.   Cardiovascular: Positive for chest pain. Negative for palpitations and leg swelling.  Gastrointestinal: Negative for abdominal pain and nausea.  Neurological: Positive for dizziness and headaches.    Social History  Substance Use Topics  . Smoking status: Former Smoker    Packs/day: 0.50    Years: 15.00  . Smokeless tobacco: Never Used  . Alcohol use No   Objective:   BP 136/66 (BP Location: Left Arm, Patient Position: Sitting, Cuff Size: Large)   Pulse 92   Temp 98.1 F (36.7 C) (Oral)   Resp 16   Wt 241 lb (109.3 kg)   SpO2 94%   BMI 37.75 kg/m   Physical Exam  Constitutional: She appears well-developed and well-nourished. No distress.  HENT:  Head: Normocephalic and atraumatic.  Right Ear: Hearing, tympanic membrane, external ear and ear canal normal.  Left Ear: Hearing, tympanic membrane, external ear and ear canal normal.  Nose: Nose normal. No mucosal edema or rhinorrhea. Right sinus exhibits no maxillary sinus tenderness and no frontal sinus tenderness. Left sinus exhibits no maxillary sinus tenderness and no frontal sinus tenderness.  Mouth/Throat: Uvula is midline, oropharynx is clear and moist and mucous membranes are normal. No oropharyngeal exudate, posterior oropharyngeal edema or posterior oropharyngeal erythema.  Eyes: Conjunctivae are normal. Pupils are equal, round, and reactive to light. Right eye exhibits no discharge. Left eye exhibits no discharge. No scleral icterus.  Neck: Normal range of motion. Neck supple. No  tracheal deviation present. No thyromegaly present.  Cardiovascular: Normal rate, regular rhythm and normal heart sounds.  Exam reveals no gallop and no friction rub.   No murmur heard. Pulmonary/Chest: Effort normal and breath sounds normal. No stridor. No respiratory distress. She has no wheezes. She has no rales.  Lymphadenopathy:    She has no cervical adenopathy.  Skin: Skin is warm and dry. She is not diaphoretic.  Vitals reviewed.     Assessment & Plan:     1. Mixed simple and mucopurulent chronic bronchitis (HCC) Lung sounds were fairly unremarkable today in the office. We'll have her continue her inhalers and nebulizer as needed. Continue the Z-Pak that she has already started. She is to call the office if symptoms fail to improve or worsen.  2. Chest pain, unspecified type EKG was obtained in the office today secondary to her chest tightness and increasing shortness of breath. EKG revealed normal sinus rhythm with a rate of 99 with possible left atrial  enlargement. No ST elevation or ischemia noted. - EKG 12-Lead  3. SOB (shortness of breath) See above medical treatment plan. - EKG 12-Lead  4. Gastroesophageal reflux disease, esophagitis presence not specified Stable. Diagnosis pulled for medication refill. Continue current medical treatment plan. - omeprazole (PRILOSEC) 40 MG capsule; Take 1 capsule (40 mg total) by mouth daily.  Dispense: 30 capsule; Refill: 3       Margaretann Loveless, PA-C  [facility]

## 2016-12-07 NOTE — Patient Instructions (Addendum)
Omeprazole tablets What is this medicine? OMEPRAZOLE (oh ME pray zol) prevents the production of acid in the stomach. It is used to treat the symptoms of heartburn. You can buy this medicine without a prescription. This product is not for long-term use, unless otherwise directed by your doctor or health care professional. This medicine may be used for other purposes; ask your health care provider or pharmacist if you have questions. COMMON BRAND NAME(S): Prilosec OTC What should I tell my health care provider before I take this medicine? They need to know if you have any of these conditions: -black or bloody stools -chest pain -difficulty swallowing -have had heartburn for over 3 months -have heartburn with dizziness, lightheadedness or sweating -liver disease -lupus -stomach pain -unexplained weight loss -vomiting with blood -wheezing -an unusual or allergic reaction to omeprazole, other medicines, foods, dyes, or preservatives -pregnant or trying to get pregnant -breast-feeding How should I use this medicine? Take this medicine by mouth. Follow the directions on the product label. If you are taking this medicine without a prescription, take one tablet every day. Do not use for longer than 14 days or repeat a course of treatment more often than every 4 months unless directed by a doctor or healthcare professional. Take your dose at regular intervals every 24 hours. Swallow the tablet whole with a drink of water. Do not crush, break or chew. This medicine works best if taken on an empty stomach 30 minutes before breakfast. If you are using this medicine with the prescription of your doctor or healthcare professional, follow the directions you were given. Do not take your medicine more often than directed. Talk to your pediatrician regarding the use of this medicine in children. Special care may be needed. Overdosage: If you think you have taken too much of this medicine contact a poison  control center or emergency room at once. NOTE: This medicine is only for you. Do not share this medicine with others. What if I miss a dose? If you miss a dose, take it as soon as you can. If it is almost time for your next dose, take only that dose. Do not take double or extra doses. What may interact with this medicine? Do not take this medicine with any of the following medications: -atazanavir -clopidogrel -nelfinavir This medicine may also interact with the following medications: -ampicillin -certain medicines for anxiety or sleep -certain medicines that treat or prevent blood clots like warfarin -cyclosporine -diazepam -digoxin -disulfiram -iron salts -methotrexate -mycophenolate mofetil -phenytoin -prescription medicine for fungal or yeast infection like itraconazole, ketoconazole, voriconazole -saquinavir -tacrolimus This list may not describe all possible interactions. Give your health care provider a list of all the medicines, herbs, non-prescription drugs, or dietary supplements you use. Also tell them if you smoke, drink alcohol, or use illegal drugs. Some items may interact with your medicine. What should I watch for while using this medicine? It can take several days before your heartburn gets better. Check with your doctor or health care professional if your condition does not start to get better, or if it gets worse. Do not treat diarrhea with over the counter products. Contact your doctor if you have diarrhea that lasts more than 2 days or if it is severe and watery. Do not treat yourself for heartburn with this medicine for more than 14 days in a row. You should only use this medicine for a 2-week treatment period once every 4 months. If your symptoms return shortly after your therapy  is complete, or within the 4 month time frame, call your doctor or health care professional. What side effects may I notice from receiving this medicine? Side effects that you should  report to your doctor or health care professional as soon as possible: -allergic reactions like skin rash, itching or hives, swelling of the face, lips, or tongue -bone, muscle or joint pain -breathing problems -chest pain or chest tightness -dark yellow or brown urine -diarrhea -dizziness -fast, irregular heartbeat -feeling faint or lightheaded -fever or sore throat -muscle spasm -palpitations -rash on cheeks or arms that gets worse in the sun -redness, blistering, peeling or loosening of the skin, including inside the mouth -seizures -tremors -unusual bleeding or bruising -unusually weak or tired -yellowing of the eyes or skin Side effects that usually do not require medical attention (report to your doctor or health care professional if they continue or are bothersome): -constipation -dry mouth -headache -loose stools -nausea This list may not describe all possible side effects. Call your doctor for medical advice about side effects. You may report side effects to FDA at 1-800-FDA-1088. Where should I keep my medicine? Keep out of the reach of children. Store at room temperature between 20 and 25 degrees C (68 and 77 degrees F). Protect from light and moisture. Throw away any unused medicine after the expiration date. NOTE: This sheet is a summary. It may not cover all possible information. If you have questions about this medicine, talk to your doctor, pharmacist, or health care provider.  2017 Elsevier/Gold Standard (2015-11-24 13:06:31) Food Choices for Gastroesophageal Reflux Disease, Adult When you have gastroesophageal reflux disease (GERD), the foods you eat and your eating habits are very important. Choosing the right foods can help ease your discomfort. What guidelines do I need to follow?  Choose fruits, vegetables, whole grains, and low-fat dairy products.  Choose low-fat meat, fish, and poultry.  Limit fats such as oils, salad dressings, butter, nuts, and  avocado.  Keep a food diary. This helps you identify foods that cause symptoms.  Avoid foods that cause symptoms. These may be different for everyone.  Eat small meals often instead of 3 large meals a day.  Eat your meals slowly, in a place where you are relaxed.  Limit fried foods.  Cook foods using methods other than frying.  Avoid drinking alcohol.  Avoid drinking large amounts of liquids with your meals.  Avoid bending over or lying down until 2-3 hours after eating. What foods are not recommended? These are some foods and drinks that may make your symptoms worse: Vegetables  Tomatoes. Tomato juice. Tomato and spaghetti sauce. Chili peppers. Onion and garlic. Horseradish. Fruits  Oranges, grapefruit, and lemon (fruit and juice). Meats  High-fat meats, fish, and poultry. This includes hot dogs, ribs, ham, sausage, salami, and bacon. Dairy  Whole milk and chocolate milk. Sour cream. Cream. Butter. Ice cream. Cream cheese. Drinks  Coffee and tea. Bubbly (carbonated) drinks or energy drinks. Condiments  Hot sauce. Barbecue sauce. Sweets/Desserts  Chocolate and cocoa. Donuts. Peppermint and spearmint. Fats and Oils  High-fat foods. This includes [place] fries and potato chips. Other  Vinegar. Strong spices. This includes black pepper, white pepper, red pepper, cayenne, curry powder, cloves, ginger, and chili powder. The items listed above may not be a complete list of foods and drinks to avoid. Contact your dietitian for more information.  This information is not intended to replace advice given to you by your health care provider. Make sure you discuss any  questions you have with your health care provider. Document Released: 04/22/2012 Document Revised: 03/29/2016 Document Reviewed: 08/26/2013 Elsevier Interactive Patient Education  2017 [employer].

## 2016-12-07 NOTE — Telephone Encounter (Signed)
I guess not if she is doing fine now.Marland Kitchen..Marland Kitchen

## 2016-12-07 NOTE — Telephone Encounter (Signed)
[id number] 

## 2016-12-11 ENCOUNTER — Telehealth: Payer: Self-pay | Admitting: Family Medicine

## 2016-12-11 NOTE — Telephone Encounter (Signed)
Please review-aa 

## 2016-12-11 NOTE — Telephone Encounter (Signed)
Pt states she was in this past Friday for chest pain.  Pt states today she has sinus congestion, sneezing, cough, chills and diarrhea.  Pt is asking if she can get something to help with her symptoms.  [facility].  NF#[phone]/IOCB#[phone]/MW

## 2016-12-12 MED ORDER — LEVOFLOXACIN 500 MG PO TABS: 500.0000 mg | ORAL_TABLET | Freq: Every day | ORAL | 0 refills | Status: DC

## 2016-12-12 NOTE — Telephone Encounter (Signed)
[facility] to [facility]. Patient advised.  

## 2016-12-12 NOTE — Telephone Encounter (Signed)
Levaquin 500mg daily for 5 days. 

## 2016-12-15 DIAGNOSIS — J449 Chronic obstructive pulmonary disease, unspecified: Secondary | ICD-10-CM | POA: Diagnosis not present

## 2016-12-25 ENCOUNTER — Encounter: Payer: Self-pay | Admitting: Family Medicine

## 2016-12-25 ENCOUNTER — Ambulatory Visit (INDEPENDENT_AMBULATORY_CARE_PROVIDER_SITE_OTHER): Payer: Medicare Other | Admitting: Family Medicine

## 2016-12-25 VITALS — BP 118/62 | HR 96 | Temp 98.5°F | Resp 20 | Wt 240.0 lb

## 2016-12-25 DIAGNOSIS — M5136 Other intervertebral disc degeneration, lumbar region: Secondary | ICD-10-CM

## 2016-12-25 DIAGNOSIS — E118 Type 2 diabetes mellitus with unspecified complications: Secondary | ICD-10-CM

## 2016-12-25 LAB — POCT GLYCOSYLATED HEMOGLOBIN (HGB A1C)
Est. average glucose Bld gHb Est-mCnc: 103
Hemoglobin A1C: 5.2

## 2016-12-25 MED ORDER — HYDROCODONE-ACETAMINOPHEN 10-325 MG PO TABS: 1.0000 | ORAL_TABLET | ORAL | 0 refills | Status: DC | PRN

## 2016-12-25 NOTE — Progress Notes (Signed)
Patient: Kristen Schroeder Female    DOB: 11-20-1940   76 y.o.   MRN: [id number] Visit Date: 12/25/2016  Today's Provider: Megan Mansichard Gilbert Jr, MD   Chief Complaint  Patient presents with  . Diabetes  . Hypertension  . Back Pain   Subjective:    HPI      Diabetes Mellitus Type II, Follow-up:   Lab Results  Component Value Date   HGBA1C 5.4 05/03/2016   HGBA1C 5.7 12/21/2015   HGBA1C 8.3 06/10/2015    Last seen for diabetes 8 months ago.  Management since then includes none. She reports good compliance with treatment. She is not having side effects.  Current symptoms include paresthesia of the feet and have been stable. Home blood sugar records: fasting range: 138 this morning  Episodes of hypoglycemia? no   Current Insulin Regimen: N/A Most Recent Eye Exam: less than one year Weight trend: stable Prior visit with dietician: no Current diet: in general, a "healthy" diet   Current exercise: chair exercises at least 3 days per week  Pertinent Labs:    Component Value Date/Time   CHOL 152 05/03/2016 1156   TRIG 99 05/03/2016 1156   HDL 50 05/03/2016 1156   LDLCALC 82 05/03/2016 1156   CREATININE 0.56 07/05/2016 2121    Wt Readings from Last 3 Encounters:  12/25/16 240 lb (108.9 kg)  12/07/16 241 lb (109.3 kg)  10/25/16 238 lb 6.4 oz (108.1 kg)    ------------------------------------------------------------------------    Hypertension, follow-up:  BP Readings from Last 3 Encounters:  12/25/16 118/62  12/07/16 136/66  10/25/16 122/80    She was last seen for hypertension 4 months ago.  BP at that visit was 102/56. Management since that visit includes continue medications. She reports good compliance with treatment.   ------------------------------------------------------------------------  Pt needs her Norco refilled for back pain. Pt also needs DMV forms filled out for handicap placard. Pt is also concerned because she is having URI sx  including rhinorrhea, watery eyes, some productive cough with yellow sputum. Pt denies wheezing and fever. Pt has tried inhalers, with relief.  Allergies  Allergen Reactions  . Codeine Nausea And Vomiting and Nausea Only     Current Outpatient Prescriptions:  .  cetirizine (ZYRTEC) 10 MG tablet, Take 10 mg by mouth daily., Disp: , Rfl:  .  cyclobenzaprine (FLEXERIL) 10 MG tablet, Take 10 mg by mouth 3 (three) times daily as needed for muscle spasms., Disp: , Rfl:  .  DULERA 100-5 MCG/ACT AERO, Inhale 2 puffs into the lungs 2 (two) times daily. , Disp: , Rfl: 4 .  fluticasone (FLONASE) 50 MCG/ACT nasal spray, Place into both nostrils daily. , Disp: , Rfl:  .  furosemide (LASIX) 20 MG tablet, TAKE 1 TABLET BY MOUTH DAILY, Disp: 90 tablet, Rfl: 3 .  gabapentin (NEURONTIN) 300 MG capsule, TAKE 2 CAPSULES BY MOUTH 2 TIME A DAY, Disp: 360 capsule, Rfl: 3 .  HYDROcodone-acetaminophen (NORCO) 10-325 MG tablet, Take 1-2 tablets by mouth every 4 (four) hours as needed., Disp: 200 tablet, Rfl: 0 .  ipratropium-albuterol (DUONEB) 0.5-2.5 (3) MG/3ML SOLN, Take 3 mLs by nebulization every 6 (six) hours as needed. , Disp: , Rfl:  .  isosorbide mononitrate (IMDUR) 30 MG 24 hr tablet, TAKE 1 TABLET (30 MG TOTAL) BY MOUTH ONCE DAILY., Disp: , Rfl:  .  levothyroxine (SYNTHROID, LEVOTHROID) 125 MCG tablet, TAKE 1 TABLET BY MOUTH DAILY, Disp: 90 tablet, Rfl: 3 .  lisinopril (  PRINIVIL,ZESTRIL) 10 MG tablet, Take 10 mg by mouth daily., Disp: , Rfl:  .  metFORMIN (GLUCOPHAGE) 1000 MG tablet, TAKE 1 TABLET BY MOUTH TWICE A DAY, Disp: 180 tablet, Rfl: 4 .  MULTIPLE VITAMIN PO, Take by mouth., Disp: , Rfl:  .  omeprazole (PRILOSEC) 40 MG capsule, Take 1 capsule (40 mg total) by mouth daily., Disp: 30 capsule, Rfl: 3 .  ondansetron (ZOFRAN ODT) 4 MG disintegrating tablet, Take 1 tablet (4 mg total) by mouth every 8 (eight) hours as needed for nausea or vomiting., Disp: 20 tablet, Rfl: 0 .  ONE TOUCH ULTRA TEST test  strip, USE 1 STRIP 2 TIMES DAILY AND AS NEEDED, Disp: 100 each, Rfl: 12 .  pravastatin (PRAVACHOL) 10 MG tablet, Take 1 tablet (10 mg total) by mouth daily., Disp: 90 tablet, Rfl: 3 .  SPIRIVA HANDIHALER 18 MCG inhalation capsule, Place 18 mcg into inhaler and inhale as needed. , Disp: , Rfl: 1  Review of Systems  Constitutional: Negative for activity change, appetite change, chills, diaphoresis, fatigue, fever and unexpected weight change.  HENT: Positive for rhinorrhea.   Eyes: Positive for discharge.  Respiratory: Positive for cough (productive of yellow sputum.). Negative for wheezing.   Cardiovascular: Negative for chest pain, palpitations and leg swelling.  Endocrine: Negative for polydipsia, polyphagia and polyuria.    Social History  Substance Use Topics  . Smoking status: Former Smoker    Packs/day: 0.50    Years: 15.00  . Smokeless tobacco: Never Used  . Alcohol use No   Objective:   BP 118/62 (BP Location: Left Arm, Patient Position: Sitting, Cuff Size: Large)   Pulse 96   Temp 98.5 F (36.9 C) (Oral)   Resp 20   Wt 240 lb (108.9 kg)   SpO2 91% Comment: 3 L O2  BMI 37.59 kg/m   Physical Exam  Constitutional: She is oriented to person, place, and time. She appears well-developed and well-nourished.  HENT:  Head: Normocephalic and atraumatic.  Right Ear: External ear normal.  Left Ear: External ear normal.  Nose: Nose normal.  Eyes: Conjunctivae are normal. No scleral icterus.  Neck: No thyromegaly present.  Cardiovascular: Normal rate, regular rhythm and normal heart sounds.   Pulmonary/Chest: Effort normal and breath sounds normal.  Abdominal: Soft.  Musculoskeletal: She exhibits no edema.  Neurological: She is alert and oriented to person, place, and time.  Skin: Skin is warm and dry.  Psychiatric: She has a normal mood and affect. Her behavior is normal. Judgment and thought content normal.        Assessment & Plan:     1. Type 2 diabetes mellitus  with complication, without long-term current use of insulin (HCC)  - POCT glycosylated hemoglobin (Hb A1C)--5.2 today.  2. DDD (degenerative disc disease), lumbar  - HYDROcodone-acetaminophen (NORCO) 10-325 MG tablet; Take 1-2 tablets by mouth every 4 (four) hours as needed.  Dispense: 200 tablet; Refill: 0 3.Obesity 4.COPD    Patient seen and examined by Julieanne Manson, MD, and note scribed by Allene Dillon, CMA. I have done the exam and reviewed the above chart and it is accurate to the best of my knowledge. Dentist has been used in this note in any air is in the dictation or transcription are unintentional.  Megan Mans, MD  [facility]

## 2017-01-05 ENCOUNTER — Other Ambulatory Visit: Payer: Self-pay | Admitting: Family Medicine

## 2017-01-12 DIAGNOSIS — J449 Chronic obstructive pulmonary disease, unspecified: Secondary | ICD-10-CM | POA: Diagnosis not present

## 2017-01-28 DIAGNOSIS — E784 Other hyperlipidemia: Secondary | ICD-10-CM | POA: Diagnosis not present

## 2017-01-28 DIAGNOSIS — J8410 Pulmonary fibrosis, unspecified: Secondary | ICD-10-CM | POA: Diagnosis not present

## 2017-01-28 DIAGNOSIS — R0602 Shortness of breath: Secondary | ICD-10-CM | POA: Diagnosis not present

## 2017-01-28 DIAGNOSIS — R0902 Hypoxemia: Secondary | ICD-10-CM | POA: Diagnosis not present

## 2017-01-28 DIAGNOSIS — I10 Essential (primary) hypertension: Secondary | ICD-10-CM | POA: Diagnosis not present

## 2017-02-12 DIAGNOSIS — J449 Chronic obstructive pulmonary disease, unspecified: Secondary | ICD-10-CM | POA: Diagnosis not present

## 2017-02-13 ENCOUNTER — Other Ambulatory Visit: Payer: Self-pay | Admitting: Physician Assistant

## 2017-02-13 DIAGNOSIS — K219 Gastro-esophageal reflux disease without esophagitis: Secondary | ICD-10-CM

## 2017-02-13 MED ORDER — OMEPRAZOLE 40 MG PO CPDR: 40.0000 mg | DELAYED_RELEASE_CAPSULE | Freq: Every day | ORAL | 1 refills | Status: DC

## 2017-02-13 NOTE — Telephone Encounter (Signed)
[facility] pharmacy faxed a request for a 90-day supply for the following medication.  Thanks CC   omeprazole (PRILOSEC) 40 MG capsule  Take 1 capsule by mouth daily

## 2017-02-19 ENCOUNTER — Other Ambulatory Visit: Payer: Self-pay | Admitting: Family Medicine

## 2017-02-22 DIAGNOSIS — R0602 Shortness of breath: Secondary | ICD-10-CM | POA: Diagnosis not present

## 2017-03-14 DIAGNOSIS — J449 Chronic obstructive pulmonary disease, unspecified: Secondary | ICD-10-CM | POA: Diagnosis not present

## 2017-03-15 ENCOUNTER — Other Ambulatory Visit: Payer: Self-pay | Admitting: Family Medicine

## 2017-03-22 ENCOUNTER — Other Ambulatory Visit: Payer: Self-pay | Admitting: Family Medicine

## 2017-03-22 DIAGNOSIS — H2511 Age-related nuclear cataract, right eye: Secondary | ICD-10-CM | POA: Diagnosis not present

## 2017-03-22 LAB — HM DIABETES EYE EXAM

## 2017-04-09 ENCOUNTER — Emergency Department: Payer: Medicare Other

## 2017-04-09 ENCOUNTER — Encounter: Payer: Self-pay | Admitting: Emergency Medicine

## 2017-04-09 ENCOUNTER — Emergency Department
Admission: EM | Admit: 2017-04-09 | Discharge: 2017-04-09 | Disposition: A | Discharge status: Home / Self Care | Payer: Medicare Other | Attending: Emergency Medicine | Admitting: Emergency Medicine

## 2017-04-09 DIAGNOSIS — R0789 Other chest pain: Secondary | ICD-10-CM | POA: Diagnosis not present

## 2017-04-09 DIAGNOSIS — Z7982 Long term (current) use of aspirin: Secondary | ICD-10-CM | POA: Insufficient documentation

## 2017-04-09 DIAGNOSIS — R079 Chest pain, unspecified: Secondary | ICD-10-CM | POA: Diagnosis not present

## 2017-04-09 DIAGNOSIS — Z87891 Personal history of nicotine dependence: Secondary | ICD-10-CM | POA: Diagnosis not present

## 2017-04-09 DIAGNOSIS — Z9981 Dependence on supplemental oxygen: Secondary | ICD-10-CM | POA: Insufficient documentation

## 2017-04-09 DIAGNOSIS — Z79899 Other long term (current) drug therapy: Secondary | ICD-10-CM | POA: Insufficient documentation

## 2017-04-09 DIAGNOSIS — R0602 Shortness of breath: Secondary | ICD-10-CM | POA: Insufficient documentation

## 2017-04-09 DIAGNOSIS — Z7984 Long term (current) use of oral hypoglycemic drugs: Secondary | ICD-10-CM | POA: Insufficient documentation

## 2017-04-09 DIAGNOSIS — Z7902 Long term (current) use of antithrombotics/antiplatelets: Secondary | ICD-10-CM | POA: Insufficient documentation

## 2017-04-09 DIAGNOSIS — I10 Essential (primary) hypertension: Secondary | ICD-10-CM | POA: Insufficient documentation

## 2017-04-09 DIAGNOSIS — J449 Chronic obstructive pulmonary disease, unspecified: Secondary | ICD-10-CM | POA: Insufficient documentation

## 2017-04-09 DIAGNOSIS — E039 Hypothyroidism, unspecified: Secondary | ICD-10-CM | POA: Insufficient documentation

## 2017-04-09 DIAGNOSIS — E119 Type 2 diabetes mellitus without complications: Secondary | ICD-10-CM | POA: Diagnosis not present

## 2017-04-09 LAB — BASIC METABOLIC PANEL
Anion gap: 8 (ref 5–15)
BUN: 9 mg/dL (ref 6–20)
CO2: 32 mmol/L (ref 22–32)
Calcium: 9 mg/dL (ref 8.9–10.3)
Chloride: 101 mmol/L (ref 101–111)
Creatinine, Ser: 0.59 mg/dL (ref 0.44–1.00)
GFR calc Af Amer: >60 mL/min (ref >60)
GFR calc non Af Amer: >60 mL/min (ref >60)
Glucose, Bld: 118 mg/dL — ABNORMAL HIGH (ref 65–99)
Potassium: 3.9 mmol/L (ref 3.5–5.1)
Sodium: 141 mmol/L (ref 135–145)

## 2017-04-09 LAB — CBC
HCT: 34.5 % — ABNORMAL LOW (ref 35.0–47.0)
Hemoglobin: 10.7 g/dL — ABNORMAL LOW (ref 12.0–16.0)
MCH: 27.7 pg (ref 26.0–34.0)
MCHC: 31.2 g/dL — ABNORMAL LOW (ref 32.0–36.0)
MCV: 88.9 fL (ref 80.0–100.0)
Platelets: 196 10*3/uL (ref 150–440)
RBC: 3.88 MIL/uL (ref 3.80–5.20)
RDW: 16.4 % — ABNORMAL HIGH (ref 11.5–14.5)
WBC: 6.5 10*3/uL (ref 3.6–11.0)

## 2017-04-09 LAB — TROPONIN I
Troponin I: <0.03 ng/mL (ref <0.03)
Troponin I: <0.03 ng/mL (ref <0.03)

## 2017-04-09 NOTE — ED Notes (Signed)
Patient to room 5.  Emma RN aware.

## 2017-04-09 NOTE — ED Provider Notes (Signed)
[facility] Emergency Department Provider Note   ____________________________________________    I have reviewed the triage vital signs and the nursing notes.   HISTORY  Chief Complaint Chest Pain and Shortness of Breath     HPI Kristen Schroeder is a 76 y.o. female who presents with complaints of mild burning chest pain and mild shortness of breath. Patient reports history of COPD, she is on 2-3 L of oxygen 24 7. She denies fevers or chills. Intermittent dry cough. Currently she reports she feels well and has no complaints. She did have burning in her chest earlier today lots of belching". She has not taken anything for her chest pain. Nothing seems to make it worse. Past Medical History:  Diagnosis Date  . Arthritis   . COPD (chronic obstructive pulmonary disease) (HCC)   . Diabetes mellitus without complication (HCC)   . Dysrhythmia   . Heart murmur   . History of orthopnea   . Hypertension   . Hypothyroidism   . Neuropathy   . Oxygen dependent    3L  CONTINUOUS  . Pain CHRONIC BACK PAIN  . Shortness of breath dyspnea   . Wheezing     Patient Active Problem List   Diagnosis Date Noted  . Aneurysm (HCC) 07/10/2016  . Nonspecific abnormal finding 05/11/2015  . Allergic rhinitis 05/11/2015  . Anxiety 05/11/2015  . Bronchitis, chronic (HCC) 05/11/2015  . CCF (congestive cardiac failure) (HCC) 05/11/2015  . Clinical depression 05/11/2015  . Diabetes mellitus, type 2 (HCC) 05/11/2015  . 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 Essential (primary) hypertension 05/11/2015  . Acid reflux 05/11/2015  . HLD (hyperlipidemia) 05/11/2015  . Adult hypothyroidism 05/11/2015  . Cervical dysplasia, mild 05/11/2015  . Neuropathy 05/11/2015  . Adiposity 05/11/2015  . Obstructive apnea 05/11/2015  . Arthritis, degenerative 05/11/2015    Past Surgical History:  Procedure Laterality Date  . ABDOMINAL HYSTERECTOMY    . CATARACT  EXTRACTION W/PHACO Left 07/03/2016   Procedure: CATARACT EXTRACTION PHACO AND INTRAOCULAR LENS PLACEMENT (IOC);  Surgeon: Galen Manila, MD;  Location: [facility] ORS;  Service: Ophthalmology;  Laterality: Left;  Lot: [id number] H [place]: 00:40.1 AP%: 17.4 CDE:6.94  . TUBAL LIGATION      Prior to Admission medications   Medication Sig Start Date End Date Taking? Authorizing Provider  aspirin EC 81 MG tablet Take 81 mg by mouth daily.   Yes [provider]  cetirizine (ZYRTEC) 10 MG tablet Take 10 mg by mouth daily.   Yes [provider]  DULERA 100-5 MCG/ACT AERO Inhale 2 puffs into the lungs 2 (two) times daily.  09/06/14  Yes [provider]  fluticasone (FLONASE) 50 MCG/ACT nasal spray Place 1 spray into both nostrils daily as needed.    Yes [provider]  furosemide (LASIX) 20 MG tablet TAKE 1 TABLET BY MOUTH DAILY 02/19/17  Yes Maple Hudson., MD  gabapentin (NEURONTIN) 300 MG capsule TAKE 2 CAPSULES BY MOUTH 2 TIME A DAY 09/28/16  Yes Maple Hudson., MD  HYDROcodone-acetaminophen [facility]) 10-325 MG tablet Take 1-2 tablets by mouth every 4 (four) hours as needed. 12/25/16  Yes Maple Hudson., MD  ipratropium-albuterol (DUONEB) 0.5-2.5 (3) MG/3ML SOLN Take 3 mLs by nebulization every 6 (six) hours as needed.    Yes [provider]  isosorbide mononitrate (IMDUR) 30 MG 24 hr tablet TAKE 1 TABLET (30 MG TOTAL) BY MOUTH ONCE DAILY. 06/13/15  Yes [provider]  levothyroxine (SYNTHROID, LEVOTHROID) 125 MCG tablet TAKE 1 TABLET BY MOUTH DAILY 03/15/16  Yes Maple HudsonGilbert, Richard L Jr., MD  lisinopril (PRINIVIL,ZESTRIL) 10 MG tablet TAKE 1 TABLET BY MOUTH EVERY DAY 03/15/17  Yes Maple HudsonGilbert, Richard L Jr., MD  metFORMIN (GLUCOPHAGE) 1000 MG tablet TAKE 1 TABLET BY MOUTH TWICE A DAY 07/13/16  Yes Maple HudsonGilbert, Richard L Jr., MD  MULTIPLE VITAMIN PO Take 1 tablet by mouth daily.    Yes [provider]  omeprazole (PRILOSEC) 40 MG capsule Take 1  capsule (40 mg total) by mouth daily. 02/13/17  Yes Maple HudsonGilbert, Richard L Jr., MD  ondansetron (ZOFRAN-ODT) 4 MG disintegrating tablet TAKE 1 TABLET BY MOUTH EVERY 8 HOURS AS NEEDED FOR NAUSEA AND VOMITING 01/07/17  Yes Maple HudsonGilbert, Richard L Jr., MD  pravastatin (PRAVACHOL) 10 MG tablet Take 1 tablet (10 mg total) by mouth daily. 05/15/16  Yes Maple HudsonGilbert, Richard L Jr., MD  SPIRIVA HANDIHALER 18 MCG inhalation capsule Place 18 mcg into inhaler and inhale daily.  09/06/14  Yes [provider]  loratadine (CLARITIN) 10 MG tablet TAKE 1 TABLET (10 MG TOTAL) BY MOUTH DAILY. Patient not taking: Reported on 04/09/2017 03/22/17   Maple HudsonGilbert, Richard L Jr., MD  [facility] test strip USE 1 STRIP 2 TIMES DAILY AND AS NEEDED 06/28/16   Maple HudsonGilbert, Richard L Jr., MD     Allergies Codeine  Family History  Problem Relation Age of Onset  . Heart disease Mother   . Drug abuse Other   . Hypertension Other     Social History Social History  Substance Use Topics  . Smoking status: Former Smoker    Packs/day: 0.50    Years: 15.00  . Smokeless tobacco: Never Used  . Alcohol use No    Review of Systems  Constitutional: No fever/chills Eyes: No visual changes.  ENT: No sore throat. Cardiovascular:As above Respiratory: Mild chronic shortness breath Gastrointestinal: No abdominal pain.   Genitourinary: Negative for dysuria. Musculoskeletal: Negative for back pain. Skin: Negative for rash. Neurological: Negative for headaches or weakness   ____________________________________________   PHYSICAL EXAM:  VITAL SIGNS: ED Triage Vitals  Enc Vitals Group     BP 04/09/17 1059 (!) 139/59     Pulse Rate 04/09/17 1059 88     Resp 04/09/17 1059 (!) 22     Temp 04/09/17 1059 98.7 F (37.1 C)     Temp Source 04/09/17 1059 Oral     SpO2 04/09/17 1059 (!) 84 %     Weight 04/09/17 1058 108.9 kg (240 lb)     Height --      Head Circumference --      Peak Flow --      Pain Score 04/09/17 1057 6      Pain Loc --      Pain Edu? --      Excl. in GC? --     Constitutional: Alert and oriented. No acute distress. Pleasant and interactive Eyes: Conjunctivae are normal.  Head: Atraumatic. Nose: No congestion/rhinnorhea. Mouth/Throat: Mucous membranes are moist.    Cardiovascular:, regular rhythm. Grossly normal heart sounds.  Good peripheral circulation. Respiratory: Normal respiratory effort.  No retractions. Scattered mild wheezes Gastrointestinal: Soft and nontender. No distention.  No CVA tenderness. Genitourinary: deferred Musculoskeletal: No lower extremity tenderness nor edema.  Warm and well perfused Neurologic:  Normal speech and language. No gross focal neurologic deficits are appreciated.  Skin:  Skin is warm, dry and intact. No rash noted. Psychiatric: Mood and affect  are normal. Speech and behavior are normal.  ____________________________________________   LABS (all labs ordered are listed, but only abnormal results are displayed)  Labs Reviewed  BASIC METABOLIC PANEL - Abnormal; Notable for the following:       Result Value   Glucose, Bld 118 (*)    All other components within normal limits  CBC - Abnormal; Notable for the following:    Hemoglobin 10.7 (*)    HCT 34.5 (*)    MCHC 31.2 (*)    RDW 16.4 (*)    All other components within normal limits  TROPONIN I  TROPONIN I   ____________________________________________  EKG  ED ECG REPORT I, Jene Every, the attending physician, personally viewed and interpreted this ECG.  Date: 04/09/2017  Rhythm: normal sinus rhythm QRS Axis: Left axis deviation Intervals: normal ST/T Wave abnormalities: normal Conduction Disturbances: none Narrative Interpretation: unremarkable  ____________________________________________  RADIOLOGY chest x-ray shows patchy atelectasis. ____________________________________________   PROCEDURES  Procedure(s) performed: No    Critical Care performed:  No ____________________________________________   INITIAL IMPRESSION / ASSESSMENT AND PLAN / ED COURSE  Pertinent labs & imaging results that were available during my care of the patient were reviewed by me and considered in my medical decision making (see chart for details).  Patient overall well-appearing and in no acute distress. On 3 L here in the emergency department she is 99%  ----------------------------------------- 2:37 PM on 04/09/2017 -----------------------------------------  Lab work is reassuring, chest x-ray is unremarkable. Patient remains well appearing and in no acute distress. Place the patient on her portable O2 condenser and her sats dropped quite rapidly, obviously there is something faulty with this device.  She has a separate device at home that she knows is working. We will check a second troponin but doubt acs. She is quite comfortable going home without o2 as she lives close by.   Second troponin normal, patient remains well appearing and comfortable. Chest pain-free    ____________________________________________   FINAL CLINICAL IMPRESSION(S) / ED DIAGNOSES  Final diagnoses:  Atypical chest pain      NEW MEDICATIONS STARTED DURING THIS VISIT:  Discharge Medication List as of 04/09/2017  3:11 PM       Note:  This document was prepared using Dragon voice recognition software and may include unintentional dictation errors.    Jene Every, MD 04/09/17 [phone]

## 2017-04-09 NOTE — ED Notes (Signed)
Patient brought back to room 5. SpO2 78% on patients home oxygen tank. Oxygen tubing changed and patient placed on room wall oxygen on 6L. SpO2% 100%. Patient turned down to 3L which is what she wears at home. SpO2 97%.

## 2017-04-09 NOTE — ED Triage Notes (Signed)
Pt with chest pain and shortness of breath since yesterday, has been belching a lot. Pt on continous home O2 at 3 liters. Pt currently on 4liters with sats at 87%.

## 2017-04-09 NOTE — ED Notes (Signed)
Patient transported to radiology

## 2017-04-11 DIAGNOSIS — J441 Chronic obstructive pulmonary disease with (acute) exacerbation: Secondary | ICD-10-CM | POA: Diagnosis not present

## 2017-04-11 DIAGNOSIS — J431 Panlobular emphysema: Secondary | ICD-10-CM | POA: Diagnosis not present

## 2017-04-11 DIAGNOSIS — J9611 Chronic respiratory failure with hypoxia: Secondary | ICD-10-CM | POA: Diagnosis not present

## 2017-04-11 DIAGNOSIS — R0602 Shortness of breath: Secondary | ICD-10-CM | POA: Diagnosis not present

## 2017-04-14 DIAGNOSIS — J449 Chronic obstructive pulmonary disease, unspecified: Secondary | ICD-10-CM | POA: Diagnosis not present

## 2017-04-22 ENCOUNTER — Ambulatory Visit (INDEPENDENT_AMBULATORY_CARE_PROVIDER_SITE_OTHER): Payer: Medicare Other | Admitting: Family Medicine

## 2017-04-22 VITALS — BP 146/80 | HR 84 | Resp 18 | Wt 237.0 lb

## 2017-04-22 DIAGNOSIS — E784 Other hyperlipidemia: Secondary | ICD-10-CM

## 2017-04-22 DIAGNOSIS — E038 Other specified hypothyroidism: Secondary | ICD-10-CM | POA: Diagnosis not present

## 2017-04-22 DIAGNOSIS — J418 Mixed simple and mucopurulent chronic bronchitis: Secondary | ICD-10-CM

## 2017-04-22 DIAGNOSIS — G629 Polyneuropathy, unspecified: Secondary | ICD-10-CM

## 2017-04-22 DIAGNOSIS — E118 Type 2 diabetes mellitus with unspecified complications: Secondary | ICD-10-CM

## 2017-04-22 DIAGNOSIS — M5136 Other intervertebral disc degeneration, lumbar region: Secondary | ICD-10-CM

## 2017-04-22 DIAGNOSIS — E7849 Other hyperlipidemia: Secondary | ICD-10-CM

## 2017-04-22 DIAGNOSIS — I10 Essential (primary) hypertension: Secondary | ICD-10-CM | POA: Diagnosis not present

## 2017-04-22 LAB — POCT UA - MICROALBUMIN: Microalbumin Ur, POC: 20 mg/L

## 2017-04-22 LAB — POCT GLYCOSYLATED HEMOGLOBIN (HGB A1C): Hemoglobin A1C: 5.1

## 2017-04-22 MED ORDER — METFORMIN HCL 1000 MG PO TABS: 1000.0000 mg | ORAL_TABLET | Freq: Every day | ORAL | 3 refills | Status: DC

## 2017-04-22 MED ORDER — HYDROCODONE-ACETAMINOPHEN 10-325 MG PO TABS: 1.0000 | ORAL_TABLET | ORAL | 0 refills | Status: DC | PRN

## 2017-04-22 NOTE — Patient Instructions (Signed)
Decrease Metformin to taking 1 tablet daily instead of 2 tablets daily.

## 2017-04-22 NOTE — Progress Notes (Signed)
Kristen KaufmanRuthie L Schroeder  MRN: [id number] DOB: 1940-12-21  Subjective:  HPI  Patient is here for follow up. Last office visit was on 12/25/16. Everything was stable. CBC and BMP were done on 04/09/17. Routine lab work was done on 05/03/16. Patient did end up going to ER on 04/09/17 for chest pain-atypical chest pain. She was having issue with breathing and burning in the chest. Her 02 level was getting low but when she was hooked up to 02 at the hospital oxygen level was better. She has lab work done and saw cardiologist and could not find the cause. She did call company that sets up her oxygen and they came to inspect her oxygen concentrators and found out that one had filter that was not replaced since 1999 and she received a new portable oxygen concentrator. Her breathing is better. She also saw pulmonologist for follow up on 04/10/17 after ER visit and patient was started on Prednisone and Daliresp but she has not started that medication yet just Prednisone.  DM: patient checks her sugar and readings have been 150s-160s usually but this morning was 118. No hypoglycemic episodes. Numbness and tingling in her feet present. Patient has been on Prednisone the past 2 weeks almost. Lab Results  Component Value Date   HGBA1C 5.2 12/25/2016   BP Readings from Last 3 Encounters:  04/22/17 (!) 146/80  04/09/17 122/63  12/25/16 118/62   Wt Readings from Last 3 Encounters:  04/22/17 237 lb (107.5 kg)  04/09/17 240 lb (108.9 kg)  12/25/16 240 lb (108.9 kg)   Patient needs refill on Norco. Patient Active Problem List   Diagnosis Date Noted  . Aneurysm (HCC) 07/10/2016  . Nonspecific abnormal finding 05/11/2015  . Allergic rhinitis 05/11/2015  . Anxiety 05/11/2015  . Bronchitis, chronic (HCC) 05/11/2015  . CCF (congestive cardiac failure) (HCC) 05/11/2015  . Clinical depression 05/11/2015  . Diabetes mellitus, type 2 (HCC) 05/11/2015  . 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 Essential  (primary) hypertension 05/11/2015  . Acid reflux 05/11/2015  . HLD (hyperlipidemia) 05/11/2015  . Adult hypothyroidism 05/11/2015  . Cervical dysplasia, mild 05/11/2015  . Neuropathy 05/11/2015  . Adiposity 05/11/2015  . Obstructive apnea 05/11/2015  . Arthritis, degenerative 05/11/2015    Past Medical History:  Diagnosis Date  . Arthritis   . COPD (chronic obstructive pulmonary disease) (HCC)   . Diabetes mellitus without complication (HCC)   . Dysrhythmia   . Heart murmur   . History of orthopnea   . Hypertension   . Hypothyroidism   . Neuropathy   . Oxygen dependent    3L  CONTINUOUS  . Pain CHRONIC BACK PAIN  . Shortness of breath dyspnea   . Wheezing     Social History   Social History  . Marital status: Married    Spouse name: N/A  . Number of children: N/A  . Years of education: N/A   Occupational History  . Not on file.   Social History Main Topics  . Smoking status: Former Smoker    Packs/day: 0.50    Years: 15.00  . Smokeless tobacco: Never Used  . Alcohol use No  . Drug use: No  . Sexual activity: No   Other Topics Concern  . Not on file   Social History Narrative  . No narrative on file    Outpatient Encounter Prescriptions as of 04/22/2017  Medication Sig Note  . aspirin EC 81 MG tablet Take 81 mg by mouth daily.   .Marland Kitchen  cetirizine (ZYRTEC) 10 MG tablet Take 10 mg by mouth daily.   . DULERA 100-5 MCG/ACT AERO Inhale 2 puffs into the lungs 2 (two) times daily.    . fluticasone (FLONASE) 50 MCG/ACT nasal spray Place 1 spray into both nostrils daily as needed.    . furosemide (LASIX) 20 MG tablet TAKE 1 TABLET BY MOUTH DAILY   . gabapentin (NEURONTIN) 300 MG capsule TAKE 2 CAPSULES BY MOUTH 2 TIME A DAY   . HYDROcodone-acetaminophen (NORCO) 10-325 MG tablet Take 1-2 tablets by mouth every 4 (four) hours as needed.   Marland Kitchen ipratropium-albuterol (DUONEB) 0.5-2.5 (3) MG/3ML SOLN Take 3 mLs by nebulization every 6 (six) hours as needed.    . isosorbide  mononitrate (IMDUR) 30 MG 24 hr tablet TAKE 1 TABLET (30 MG TOTAL) BY MOUTH ONCE DAILY. 04/09/2017: Pt hasn't been takig qd, just prn  . levothyroxine (SYNTHROID, LEVOTHROID) 125 MCG tablet TAKE 1 TABLET BY MOUTH DAILY   . lisinopril (PRINIVIL,ZESTRIL) 10 MG tablet TAKE 1 TABLET BY MOUTH EVERY DAY   . loratadine (CLARITIN) 10 MG tablet TAKE 1 TABLET (10 MG TOTAL) BY MOUTH DAILY.   . metFORMIN (GLUCOPHAGE) 1000 MG tablet TAKE 1 TABLET BY MOUTH TWICE A DAY   . MULTIPLE VITAMIN PO Take 1 tablet by mouth daily.    Marland Kitchen omeprazole (PRILOSEC) 40 MG capsule Take 1 capsule (40 mg total) by mouth daily.   . ondansetron (ZOFRAN-ODT) 4 MG disintegrating tablet TAKE 1 TABLET BY MOUTH EVERY 8 HOURS AS NEEDED FOR NAUSEA AND VOMITING   . ONE TOUCH ULTRA TEST test strip USE 1 STRIP 2 TIMES DAILY AND AS NEEDED   . pravastatin (PRAVACHOL) 10 MG tablet Take 1 tablet (10 mg total) by mouth daily.   . predniSONE (STERAPRED UNI-PAK 48 TAB) 10 MG (48) TBPK tablet TAKE 1 DOSE PACK AS DIRECTED ON THE BACK OF THE BLISTER PACK   . SPIRIVA HANDIHALER 18 MCG inhalation capsule Place 18 mcg into inhaler and inhale daily.    Marland Kitchen DALIRESP 500 MCG TABS tablet     No facility-administered encounter medications on file as of 04/22/2017.     Allergies  Allergen Reactions  . Codeine Nausea And Vomiting and Nausea Only    Review of Systems  Constitutional: Positive for malaise/fatigue.  Eyes: Negative.   Respiratory: Positive for cough, sputum production and shortness of breath.   Cardiovascular: Negative.   Gastrointestinal: Negative.   Genitourinary: Negative.   Musculoskeletal: Positive for back pain and joint pain.  Skin: Negative.   Neurological: Positive for tingling. Tremors: and numbness sensation.       Unsteady gait. Using a cane  Endo/Heme/Allergies: Negative.   Psychiatric/Behavioral: Negative.     Objective:  BP (!) 146/80   Pulse 84   Resp 18   Wt 237 lb (107.5 kg)   SpO2 96% Comment: on 4 liters of oxygen   BMI 37.12 kg/m   Physical Exam  Constitutional: She is oriented to person, place, and time and well-developed, well-nourished, and in no distress.  HENT:  Head: Normocephalic and atraumatic.  Right Ear: External ear normal.  Left Ear: External ear normal.  Nose: Nose normal.  Mouth/Throat: Oropharynx is clear and moist.  Eyes: Conjunctivae are normal. Pupils are equal, round, and reactive to light.  Neck: Normal range of motion. Neck supple.  Cardiovascular: Normal rate, regular rhythm, normal heart sounds and intact distal pulses.  Exam reveals no gallop.   No murmur heard. Pulmonary/Chest: Effort normal and breath sounds  normal. No respiratory distress. She has no wheezes.  Abdominal: Soft.  Musculoskeletal: She exhibits no edema or tenderness.  Neurological: She is alert and oriented to person, place, and time. Gait normal. GCS score is 15.  Skin: Skin is warm and dry.  Psychiatric: Mood, memory, affect and judgment normal.    Assessment and Plan :  1. Type 2 diabetes mellitus with complication, without long-term current use of insulin (HCC)/prediabetes A1C 5.1. Normal sugar now. Decrease Metformin to taking 1 tablet daily instead of 2 tablets. - POCT HgB A1C - POCT UA - Microalbumin  2. DDD (degenerative disc disease), lumbar Refills x 2 provided. - HYDROcodone-acetaminophen (NORCO) 10-325 MG tablet; Take 1-2 tablets by mouth every 4 (four) hours as needed.  Dispense: 200 tablet; Refill: 0  3. Other specified hypothyroidism - TSH  4. Essential hypertension Stable. - CBC with Differential/Platelet - Comprehensive metabolic panel  5. Mixed simple and mucopurulent chronic bronchitis [facility]) Seen pulmonologist for routine follow ups.  6. Neuropathy - TSH  7. Other hyperlipidemia - Comprehensive metabolic panel - Lipid Panel With LDL/HDL Ratio HPI, Exam and A&P transcribed by Samara Deist, RMA under direction and in the presence of Julieanne Manson, MD.

## 2017-04-23 LAB — COMPREHENSIVE METABOLIC PANEL
ALT: 7 IU/L (ref 0–32)
AST: 14 IU/L (ref 0–40)
Albumin/Globulin Ratio: 1.7 (ref 1.2–2.2)
Albumin: 3.7 g/dL (ref 3.5–4.8)
Alkaline Phosphatase: 60 IU/L (ref 39–117)
BUN/Creatinine Ratio: 24 (ref 12–28)
BUN: 14 mg/dL (ref 8–27)
Bilirubin Total: <0.2 mg/dL (ref 0.0–1.2)
CO2: 31 mmol/L — ABNORMAL HIGH (ref 20–29)
Calcium: 9 mg/dL (ref 8.7–10.3)
Chloride: 102 mmol/L (ref 96–106)
Creatinine, Ser: 0.59 mg/dL (ref 0.57–1.00)
GFR calc Af Amer: 104 mL/min/{1.73_m2} (ref >59)
GFR calc non Af Amer: 90 mL/min/{1.73_m2} (ref >59)
Globulin, Total: 2.2 g/dL (ref 1.5–4.5)
Glucose: 122 mg/dL — ABNORMAL HIGH (ref 65–99)
Potassium: 4.3 mmol/L (ref 3.5–5.2)
Sodium: 149 mmol/L — ABNORMAL HIGH (ref 134–144)
Total Protein: 5.9 g/dL — ABNORMAL LOW (ref 6.0–8.5)

## 2017-04-23 LAB — CBC WITH DIFFERENTIAL/PLATELET
Basophils Absolute: 0 10*3/uL (ref 0.0–0.2)
Basos: 0 %
EOS (ABSOLUTE): 0.2 10*3/uL (ref 0.0–0.4)
Eos: 2 %
Hematocrit: 35.3 % (ref 34.0–46.6)
Hemoglobin: 10.9 g/dL — ABNORMAL LOW (ref 11.1–15.9)
Immature Grans (Abs): 0 10*3/uL (ref 0.0–0.1)
Immature Granulocytes: 0 %
Lymphocytes Absolute: 1.2 10*3/uL (ref 0.7–3.1)
Lymphs: 10 %
MCH: 27 pg (ref 26.6–33.0)
MCHC: 30.9 g/dL — ABNORMAL LOW (ref 31.5–35.7)
MCV: 88 fL (ref 79–97)
Monocytes Absolute: 0.7 10*3/uL (ref 0.1–0.9)
Monocytes: 6 %
Neutrophils Absolute: 9.1 10*3/uL — ABNORMAL HIGH (ref 1.4–7.0)
Neutrophils: 82 %
Platelets: 250 10*3/uL (ref 150–379)
RBC: 4.03 x10E6/uL (ref 3.77–5.28)
RDW: 15.5 % — ABNORMAL HIGH (ref 12.3–15.4)
WBC: 11.2 10*3/uL — ABNORMAL HIGH (ref 3.4–10.8)

## 2017-04-23 LAB — TSH: TSH: 1.07 u[IU]/mL (ref 0.450–4.500)

## 2017-04-23 LAB — LIPID PANEL WITH LDL/HDL RATIO
Cholesterol, Total: 147 mg/dL (ref 100–199)
HDL: 67 mg/dL (ref >39)
LDL Calculated: 60 mg/dL (ref 0–99)
LDl/HDL Ratio: 0.9 ratio (ref 0.0–3.2)
Triglycerides: 100 mg/dL (ref 0–149)
VLDL Cholesterol Cal: 20 mg/dL (ref 5–40)

## 2017-04-23 NOTE — Progress Notes (Signed)
Advised  ED 

## 2017-04-25 ENCOUNTER — Ambulatory Visit: Payer: Medicare Other | Admitting: Family Medicine

## 2017-05-14 DIAGNOSIS — J449 Chronic obstructive pulmonary disease, unspecified: Secondary | ICD-10-CM | POA: Diagnosis not present

## 2017-05-16 DIAGNOSIS — J449 Chronic obstructive pulmonary disease, unspecified: Secondary | ICD-10-CM | POA: Diagnosis not present

## 2017-05-16 DIAGNOSIS — J301 Allergic rhinitis due to pollen: Secondary | ICD-10-CM | POA: Diagnosis not present

## 2017-05-16 DIAGNOSIS — J9611 Chronic respiratory failure with hypoxia: Secondary | ICD-10-CM | POA: Diagnosis not present

## 2017-05-16 DIAGNOSIS — R0602 Shortness of breath: Secondary | ICD-10-CM | POA: Diagnosis not present

## 2017-05-20 ENCOUNTER — Other Ambulatory Visit: Payer: Self-pay | Admitting: Family Medicine

## 2017-05-29 ENCOUNTER — Encounter: Payer: Self-pay | Admitting: Physician Assistant

## 2017-05-29 ENCOUNTER — Ambulatory Visit (INDEPENDENT_AMBULATORY_CARE_PROVIDER_SITE_OTHER): Payer: Medicare Other | Admitting: Physician Assistant

## 2017-05-29 VITALS — BP 150/70 | HR 83 | Temp 98.2°F | Resp 18 | Wt 235.2 lb

## 2017-05-29 DIAGNOSIS — J441 Chronic obstructive pulmonary disease with (acute) exacerbation: Secondary | ICD-10-CM | POA: Diagnosis not present

## 2017-05-29 MED ORDER — AMOXICILLIN-POT CLAVULANATE 875-125 MG PO TABS: 1.0000 | ORAL_TABLET | Freq: Two times a day (BID) | ORAL | 0 refills | Status: DC

## 2017-05-29 NOTE — Progress Notes (Signed)
Patient: Kristen Schroeder Female    DOB: 03/21/1941   76 y.o.   MRN: [id number] Visit Date: 05/29/2017  Today's Provider: Margaretann LovelessJennifer M Charbel Los, PA-C   Chief Complaint  Patient presents with  . URI   Subjective:    URI   This is a new problem. The current episode started in the past 7 days (Startedon Saturday). The problem has been gradually worsening. There has been no fever. Associated symptoms include abdominal pain (epigastric region), congestion, coughing, headaches, rhinorrhea, sinus pain and wheezing. Pertinent negatives include no chest pain, diarrhea, dysuria, ear pain, nausea, neck pain, plugged ear sensation, sneezing, sore throat or vomiting. She has tried antihistamine and inhaler use for the symptoms. The treatment provided no relief.  Patient is currently on 4L O2 via nasal cannula during the day and 3L at night.     Allergies  Allergen Reactions  . Codeine Nausea And Vomiting and Nausea Only     Current Outpatient Prescriptions:  .  aspirin EC 81 MG tablet, Take 81 mg by mouth daily., Disp: , Rfl:  .  cetirizine (ZYRTEC) 10 MG tablet, Take 10 mg by mouth daily., Disp: , Rfl:  .  DALIRESP 500 MCG TABS tablet, , Disp: , Rfl:  .  DULERA 100-5 MCG/ACT AERO, Inhale 2 puffs into the lungs 2 (two) times daily. , Disp: , Rfl: 4 .  fluticasone (FLONASE) 50 MCG/ACT nasal spray, Place 1 spray into both nostrils daily as needed. , Disp: , Rfl:  .  furosemide (LASIX) 20 MG tablet, TAKE 1 TABLET BY MOUTH DAILY, Disp: 90 tablet, Rfl: 3 .  gabapentin (NEURONTIN) 300 MG capsule, TAKE 2 CAPSULES BY MOUTH 2 TIME A DAY, Disp: 360 capsule, Rfl: 3 .  HYDROcodone-acetaminophen (NORCO) 10-325 MG tablet, Take 1-2 tablets by mouth every 4 (four) hours as needed., Disp: 200 tablet, Rfl: 0 .  ipratropium-albuterol (DUONEB) 0.5-2.5 (3) MG/3ML SOLN, Take 3 mLs by nebulization every 6 (six) hours as needed. , Disp: , Rfl:  .  isosorbide mononitrate (IMDUR) 30 MG 24 hr tablet, TAKE 1 TABLET (30  MG TOTAL) BY MOUTH ONCE DAILY., Disp: , Rfl:  .  levothyroxine (SYNTHROID, LEVOTHROID) 125 MCG tablet, TAKE 1 TABLET BY MOUTH DAILY, Disp: 90 tablet, Rfl: 3 .  lisinopril (PRINIVIL,ZESTRIL) 10 MG tablet, TAKE 1 TABLET BY MOUTH EVERY DAY, Disp: 90 tablet, Rfl: 3 .  loratadine (CLARITIN) 10 MG tablet, TAKE 1 TABLET (10 MG TOTAL) BY MOUTH DAILY., Disp: 90 tablet, Rfl: 3 .  metFORMIN (GLUCOPHAGE) 1000 MG tablet, Take 1 tablet (1,000 mg total) by mouth daily with breakfast., Disp: 90 tablet, Rfl: 3 .  MULTIPLE VITAMIN PO, Take 1 tablet by mouth daily. , Disp: , Rfl:  .  ondansetron (ZOFRAN-ODT) 4 MG disintegrating tablet, TAKE 1 TABLET BY MOUTH EVERY 8 HOURS AS NEEDED FOR NAUSEA AND VOMITING, Disp: 20 tablet, Rfl: 3 .  ONE TOUCH ULTRA TEST test strip, USE 1 STRIP 2 TIMES DAILY AND AS NEEDED, Disp: 100 each, Rfl: 12 .  pravastatin (PRAVACHOL) 10 MG tablet, TAKE 1 TABLET (10 MG TOTAL) BY MOUTH DAILY., Disp: 90 tablet, Rfl: 3 .  SPIRIVA HANDIHALER 18 MCG inhalation capsule, Place 18 mcg into inhaler and inhale daily. , Disp: , Rfl: 1 .  omeprazole (PRILOSEC) 40 MG capsule, Take 1 capsule (40 mg total) by mouth daily. (Patient not taking: Reported on 05/29/2017), Disp: 90 capsule, Rfl: 1 .  predniSONE (STERAPRED UNI-PAK 48 TAB) 10 MG (48) TBPK  tablet, TAKE 1 DOSE PACK AS DIRECTED ON THE BACK OF THE BLISTER PACK, Disp: , Rfl: 0  Review of Systems  Constitutional: Positive for chills and fatigue. Negative for fever.  HENT: Positive for congestion, postnasal drip, rhinorrhea, sinus pain and sinus pressure. Negative for ear discharge, ear pain, sneezing, sore throat, tinnitus, trouble swallowing and voice change.   Respiratory: Positive for cough, shortness of breath and wheezing. Negative for chest tightness.        Patient is currently on 4 liters of oxygen  Cardiovascular: Negative for chest pain.  Gastrointestinal: Positive for abdominal pain (epigastric region). Negative for diarrhea, nausea and vomiting.    Genitourinary: Negative for dysuria.  Musculoskeletal: Negative for neck pain.  Neurological: Positive for dizziness (a little) and headaches. Negative for light-headedness.    Social History  Substance Use Topics  . Smoking status: Former Smoker    Packs/day: 0.50    Years: 15.00  . Smokeless tobacco: Never Used  . Alcohol use No   Objective:   BP (!) 150/70 (BP Location: Left Arm, Patient Position: Sitting, Cuff Size: Large)   Pulse 83   Temp 98.2 F (36.8 C) (Oral)   Resp 18   Wt 235 lb 3.2 oz (106.7 kg)   SpO2 94% Comment: on 4 liters of oxygen  BMI 36.84 kg/m     Physical Exam  Constitutional: She appears well-developed and well-nourished. No distress.  HENT:  Head: Normocephalic and atraumatic.  Right Ear: Hearing, tympanic membrane, external ear and ear canal normal.  Left Ear: Hearing, tympanic membrane, external ear and ear canal normal.  Nose: Nose normal.  Mouth/Throat: Uvula is midline, oropharynx is clear and moist and mucous membranes are normal. No oropharyngeal exudate.  Eyes: Pupils are equal, round, and reactive to light. Conjunctivae are normal. Right eye exhibits no discharge. Left eye exhibits no discharge. No scleral icterus.  Neck: Normal range of motion. Neck supple. No tracheal deviation present. No thyromegaly present.  Cardiovascular: Normal rate, regular rhythm and normal heart sounds.  Exam reveals no gallop and no friction rub.   No murmur heard. Pulmonary/Chest: Effort normal. No stridor. No respiratory distress. She has decreased breath sounds (throughout). She has no wheezes. She has rhonchi (throughout, cleared some with coughing). She has no rales.  Lymphadenopathy:    She has no cervical adenopathy.  Skin: Skin is warm and dry. She is not diaphoretic.  Vitals reviewed.       Assessment & Plan:     1. COPD with acute exacerbation (HCC) Will treat with augmentin as below. Continue all inhalers and nebulizer treatments as  prescribed. Will hold off on steroid taper since she just completed one from her pulmonologist and has diabetes. She is to call in 2-3 days if no improvement or symptoms worsen.  - amoxicillin-clavulanate (AUGMENTIN) 875-125 MG tablet; Take 1 tablet by mouth 2 (two) times daily.  Dispense: 20 tablet; Refill: 0       Margaretann LovelessJennifer M Jaxson Keener, PA-C  [facility]

## 2017-05-29 NOTE — Patient Instructions (Signed)
Chronic Obstructive Pulmonary Disease Exacerbation  Chronic obstructive pulmonary disease (COPD) is a common lung problem. In COPD, the flow of air from the lungs is limited. COPD exacerbations are times that breathing gets worse and you need extra treatment. Without treatment they can be life threatening. If they happen often, your lungs can become more damaged. If your COPD gets worse, your doctor may treat you with:  ? Medicines.  ? Oxygen.  ? Different ways to clear your airway, such as using a mask.    Follow these instructions at home:  ? Do not smoke.  ? Avoid tobacco smoke and other things that bother your lungs.  ? If given, take your antibiotic medicine as told. Finish the medicine even if you start to feel better.  ? Only take medicines as told by your doctor.  ? Drink enough fluids to keep your pee (urine) clear or pale yellow (unless your doctor has told you not to).  ? Use a cool mist machine (vaporizer).  ? If you use oxygen or a machine that turns liquid medicine into a mist (nebulizer), continue to use them as told.  ? Keep up with shots (vaccinations) as told by your doctor.  ? Exercise regularly.  ? Eat healthy foods.  ? Keep all doctor visits as told.  Get help right away if:  ? You are very short of breath and it gets worse.  ? You have trouble talking.  ? You have bad chest pain.  ? You have blood in your spit (sputum).  ? You have a fever.  ? You keep throwing up (vomiting).  ? You feel weak, or you pass out (faint).  ? You feel confused.  ? You keep getting worse.  This information is not intended to replace advice given to you by your health care provider. Make sure you discuss any questions you have with your health care provider.  Document Released: 10/11/2011 Document Revised: 03/29/2016 Document Reviewed: 06/26/2013  Elsevier Interactive Patient Education ? 2017 Elsevier Inc[employer]

## 2017-06-06 ENCOUNTER — Ambulatory Visit (INDEPENDENT_AMBULATORY_CARE_PROVIDER_SITE_OTHER): Payer: Medicare Other | Admitting: Family Medicine

## 2017-06-06 VITALS — BP 158/74 | HR 86 | Temp 98.1°F | Resp 18 | Wt 233.0 lb

## 2017-06-06 DIAGNOSIS — J449 Chronic obstructive pulmonary disease, unspecified: Secondary | ICD-10-CM

## 2017-06-06 DIAGNOSIS — J42 Unspecified chronic bronchitis: Secondary | ICD-10-CM

## 2017-06-06 DIAGNOSIS — G4733 Obstructive sleep apnea (adult) (pediatric): Secondary | ICD-10-CM

## 2017-06-06 DIAGNOSIS — R600 Localized edema: Secondary | ICD-10-CM | POA: Diagnosis not present

## 2017-06-06 DIAGNOSIS — F419 Anxiety disorder, unspecified: Secondary | ICD-10-CM

## 2017-06-06 DIAGNOSIS — R0902 Hypoxemia: Secondary | ICD-10-CM | POA: Diagnosis not present

## 2017-06-06 MED ORDER — FUROSEMIDE 20 MG PO TABS: 40.0000 mg | ORAL_TABLET | Freq: Every day | ORAL | 3 refills | Status: DC

## 2017-06-06 MED ORDER — PAROXETINE HCL 10 MG PO TABS: 10.0000 mg | ORAL_TABLET | Freq: Every day | ORAL | 3 refills | Status: DC

## 2017-06-06 NOTE — Progress Notes (Signed)
Kristen Schroeder  MRN: [id number] DOB: Jul 29, 1941  Subjective:  HPI   The patient is a 76 year old female who presents for evaluation of anxiety.  The patient was last seen on 05/29/17 by [name], PA.  At that time she was treated for an exacerbation of her COPD. The patient reports that she finds herself more anxious these days, especially at night.  She states that here is no specific situation of new stress to be causing this anxiety.  She relates it to her continued progression of her COPD.  The patient is on 3 LPM of oxygen daily.  About 3 months ago she was able to go during the day and not use the oxygen.  Now she can only go about 2-3 hours and then she needs to put it back on.  She does not have a pulse oximeter at home so she is unsure what her levels are during these episodes.  However today while in the office her PO2 on 3LPM of O2 while at rest was 92%.  Within 2-3 minutes of taking her oxygen off, and still at rest, she was down to 85 %. The patient states that because of the increased need for the oxygen she becomes more anxious.  At night she can usually fall asleep ok but then after only about 2 hours she wakes up and then can not go back to sleep.  She states this happens 2-3 times per week.  On the other nights she is sleeping well.  She does at times nap during the day.  Patient Active Problem List   Diagnosis Date Noted  . Aneurysm (HCC) 07/10/2016  . Nonspecific abnormal finding 05/11/2015  . Allergic rhinitis 05/11/2015  . Anxiety 05/11/2015  . Bronchitis, chronic (HCC) 05/11/2015  . CCF (congestive cardiac failure) (HCC) 05/11/2015  . Clinical depression 05/11/2015  . 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 Essential (primary) hypertension 05/11/2015  . Acid reflux 05/11/2015  . HLD (hyperlipidemia) 05/11/2015  . Adult hypothyroidism 05/11/2015  . Cervical dysplasia, mild 05/11/2015  . Neuropathy 05/11/2015  . Adiposity 05/11/2015  .  Obstructive apnea 05/11/2015  . Arthritis, degenerative 05/11/2015    Past Medical History:  Diagnosis Date  . Arthritis   . COPD (chronic obstructive pulmonary disease) (HCC)   . Diabetes mellitus without complication (HCC)   . Dysrhythmia   . Heart murmur   . History of orthopnea   . Hypertension   . Hypothyroidism   . Neuropathy   . Oxygen dependent    3L  CONTINUOUS  . Pain CHRONIC BACK PAIN  . Shortness of breath dyspnea   . Wheezing     Social History   Social History  . Marital status: Married    Spouse name: N/A  . Number of children: N/A  . Years of education: N/A   Occupational History  . Not on file.   Social History Main Topics  . Smoking status: Former Smoker    Packs/day: 0.50    Years: 15.00  . Smokeless tobacco: Never Used  . Alcohol use No  . Drug use: No  . Sexual activity: No   Other Topics Concern  . Not on file   Social History Narrative  . No narrative on file    Outpatient Encounter Prescriptions as of 06/06/2017  Medication Sig Note  . amoxicillin-clavulanate (AUGMENTIN) 875-125 MG tablet Take 1 tablet by mouth 2 (two) times daily.   Marland Kitchen aspirin EC 81 MG tablet Take  81 mg by mouth daily.   Marland Kitchen. DALIRESP 500 MCG TABS tablet    . DULERA 100-5 MCG/ACT AERO Inhale 2 puffs into the lungs 2 (two) times daily.    . fluticasone (FLONASE) 50 MCG/ACT nasal spray Place 1 spray into both nostrils daily as needed.    . furosemide (LASIX) 20 MG tablet TAKE 1 TABLET BY MOUTH DAILY   . gabapentin (NEURONTIN) 300 MG capsule TAKE 2 CAPSULES BY MOUTH 2 TIME A DAY   . HYDROcodone-acetaminophen (NORCO) 10-325 MG tablet Take 1-2 tablets by mouth every 4 (four) hours as needed.   Marland Kitchen. ipratropium-albuterol (DUONEB) 0.5-2.5 (3) MG/3ML SOLN Take 3 mLs by nebulization every 6 (six) hours as needed.    . isosorbide mononitrate (IMDUR) 30 MG 24 hr tablet TAKE 1 TABLET (30 MG TOTAL) BY MOUTH ONCE DAILY. 04/09/2017: Pt hasn't been takig qd, just prn  . levothyroxine  (SYNTHROID, LEVOTHROID) 125 MCG tablet TAKE 1 TABLET BY MOUTH DAILY   . lisinopril (PRINIVIL,ZESTRIL) 10 MG tablet TAKE 1 TABLET BY MOUTH EVERY DAY   . loratadine (CLARITIN) 10 MG tablet TAKE 1 TABLET (10 MG TOTAL) BY MOUTH DAILY.   . metFORMIN (GLUCOPHAGE) 1000 MG tablet Take 1 tablet (1,000 mg total) by mouth daily with breakfast.   . MULTIPLE VITAMIN PO Take 1 tablet by mouth daily.    Marland Kitchen. omeprazole (PRILOSEC) 40 MG capsule Take 1 capsule (40 mg total) by mouth daily.   . ondansetron (ZOFRAN-ODT) 4 MG disintegrating tablet TAKE 1 TABLET BY MOUTH EVERY 8 HOURS AS NEEDED FOR NAUSEA AND VOMITING   . ONE TOUCH ULTRA TEST test strip USE 1 STRIP 2 TIMES DAILY AND AS NEEDED   . pravastatin (PRAVACHOL) 10 MG tablet TAKE 1 TABLET (10 MG TOTAL) BY MOUTH DAILY.   Marland Kitchen. SPIRIVA HANDIHALER 18 MCG inhalation capsule Place 18 mcg into inhaler and inhale daily.    . [DISCONTINUED] cetirizine (ZYRTEC) 10 MG tablet Take 10 mg by mouth daily.   . [DISCONTINUED] predniSONE (STERAPRED UNI-PAK 48 TAB) 10 MG (48) TBPK tablet TAKE 1 DOSE PACK AS DIRECTED ON THE BACK OF THE BLISTER PACK    No facility-administered encounter medications on file as of 06/06/2017.     Allergies  Allergen Reactions  . Codeine Nausea And Vomiting and Nausea Only    Review of Systems  Constitutional: Positive for malaise/fatigue. Negative for chills and fever.  Respiratory: Positive for cough, sputum production and shortness of breath. Negative for hemoptysis and wheezing.   Cardiovascular: Positive for chest pain (She had been having some heaviness of her chest prior to the treatment for her COPD exacerbation, none since starting that treatment.), orthopnea (chronic) and leg swelling. Negative for palpitations.  Gastrointestinal: Positive for nausea. Negative for abdominal pain, blood in stool, constipation, diarrhea, heartburn, melena and vomiting.  Neurological: Positive for weakness and headaches (since having the drainage).      Objective:  BP (!) 158/74 (BP Location: Right Arm, Patient Position: Sitting, Cuff Size: Large)   Pulse 86   Temp 98.1 F (36.7 C) (Oral)   Resp 18   Wt 233 lb (105.7 kg)   SpO2 93%   BMI 36.49 kg/m   Physical Exam  Constitutional: She is well-developed, well-nourished, and in no distress.  HENT:  Head: Normocephalic and atraumatic.  Eyes: Pupils are equal, round, and reactive to light. Conjunctivae are normal.  Neck: Normal range of motion. Neck supple.  Cardiovascular: Normal rate, regular rhythm and normal heart sounds.   Pulmonary/Chest: Effort  normal and breath sounds normal.  Musculoskeletal: She exhibits edema (1+ bilat).  Neurological: She is alert.  Patient presents today using the wheel chair and carrying her cane.    Assessment and Plan :   1. Lower extremity edema Watch for any s/s of CHF.  - furosemide (LASIX) 20 MG tablet; Take 2 tablets (40 mg total) by mouth daily.  Dispense: 180 tablet; Refill: 3  2. Anxiety  - PARoxetine (PAXIL) 10 MG tablet; Take 1 tablet (10 mg total) by mouth at bedtime.  Dispense: 30 tablet; Refill: 3  3. Hypoxia Consider CXR. - Wheelchair - DME Wheelchair manual  4. Chronic obstructive pulmonary disease, unspecified COPD type (HCC)  - Wheelchair - DME Wheelchair manual 5.COPD  I have done the exam and reviewed the chart and it is accurate to the best of my knowledge. [employer]  technology has been used and  any errors in dictation or transcription are unintentional. Julieanne Mansonichard Avriana Joo M.D. [facility]

## 2017-06-13 ENCOUNTER — Telehealth: Payer: Self-pay | Admitting: Emergency Medicine

## 2017-06-13 ENCOUNTER — Other Ambulatory Visit: Payer: Self-pay

## 2017-06-13 ENCOUNTER — Emergency Department: Payer: Medicare Other

## 2017-06-13 DIAGNOSIS — M1990 Unspecified osteoarthritis, unspecified site: Secondary | ICD-10-CM | POA: Insufficient documentation

## 2017-06-13 DIAGNOSIS — R1010 Upper abdominal pain, unspecified: Secondary | ICD-10-CM | POA: Insufficient documentation

## 2017-06-13 DIAGNOSIS — M545 Low back pain: Secondary | ICD-10-CM | POA: Diagnosis present

## 2017-06-13 DIAGNOSIS — K529 Noninfective gastroenteritis and colitis, unspecified: Secondary | ICD-10-CM | POA: Insufficient documentation

## 2017-06-13 DIAGNOSIS — K5730 Diverticulosis of large intestine without perforation or abscess without bleeding: Secondary | ICD-10-CM | POA: Diagnosis not present

## 2017-06-13 DIAGNOSIS — M479 Spondylosis, unspecified: Secondary | ICD-10-CM | POA: Diagnosis not present

## 2017-06-13 DIAGNOSIS — I10 Essential (primary) hypertension: Secondary | ICD-10-CM | POA: Diagnosis not present

## 2017-06-13 DIAGNOSIS — J449 Chronic obstructive pulmonary disease, unspecified: Secondary | ICD-10-CM | POA: Diagnosis not present

## 2017-06-13 DIAGNOSIS — R110 Nausea: Secondary | ICD-10-CM

## 2017-06-13 LAB — BASIC METABOLIC PANEL
Anion gap: 10 (ref 5–15)
BUN: 9 mg/dL (ref 6–20)
CO2: 37 mmol/L — ABNORMAL HIGH (ref 22–32)
Calcium: 8.8 mg/dL — ABNORMAL LOW (ref 8.9–10.3)
Chloride: 99 mmol/L — ABNORMAL LOW (ref 101–111)
Creatinine, Ser: 0.57 mg/dL (ref 0.44–1.00)
GFR calc Af Amer: >60 mL/min (ref >60)
GFR calc non Af Amer: >60 mL/min (ref >60)
Glucose, Bld: 119 mg/dL — ABNORMAL HIGH (ref 65–99)
Potassium: 3.5 mmol/L (ref 3.5–5.1)
Sodium: 146 mmol/L — ABNORMAL HIGH (ref 135–145)

## 2017-06-13 LAB — CBC
HCT: 31.5 % — ABNORMAL LOW (ref 35.0–47.0)
Hemoglobin: 9.9 g/dL — ABNORMAL LOW (ref 12.0–16.0)
MCH: 26.5 pg (ref 26.0–34.0)
MCHC: 31.6 g/dL — ABNORMAL LOW (ref 32.0–36.0)
MCV: 83.8 fL (ref 80.0–100.0)
Platelets: 268 10*3/uL (ref 150–440)
RBC: 3.76 MIL/uL — ABNORMAL LOW (ref 3.80–5.20)
RDW: 16.2 % — ABNORMAL HIGH (ref 11.5–14.5)
WBC: 8.2 10*3/uL (ref 3.6–11.0)

## 2017-06-13 LAB — TROPONIN I: Troponin I: <0.03 ng/mL (ref <0.03)

## 2017-06-13 MED ORDER — ONDANSETRON 4 MG PO TBDP: ORAL_TABLET | ORAL | 3 refills | Status: DC

## 2017-06-13 NOTE — ED Triage Notes (Signed)
Pt to triage via wheelchair. Pt has copd and is on 4 liters oxygen.  Pt has nausea, mid back pain, and pain beneath left breast.  Sx for 1 week.  Denies chest pain.  Pt alert. Speech clear.

## 2017-06-13 NOTE — Telephone Encounter (Signed)
Pt was seen by Dr. Sullivan LoneGilbert last week and was having nausea he told her to take Omeprazole, she has been but it is not getting any better, pt reports that she needs something stronger or different. Please advise. Thanks.     [facility]

## 2017-06-13 NOTE — Telephone Encounter (Signed)
It may take omeprazole 2 weeks to work. I would recommend her trying this a little longer to see if it will. She may use an OTC nausea medication like pepto bismol for nausea while awaiting to see if omeprazole will help her.

## 2017-06-13 NOTE — Telephone Encounter (Signed)
Patient advised as below. I advised patient I will let Dr Sullivan LoneGilbert see this message and see if he wants to make any changes. Patient has not started any new medications to contribute to nausea. -aa

## 2017-06-13 NOTE — Telephone Encounter (Signed)
I do think she should still continue her omeprazole to see if it will help. Is there any new medication she has started at night that may be upsetting her stomach? I will refill her zofran. She should readdress with Dr. Reece AgarG if no improvements after being on omeprazole for 2 weeks.

## 2017-06-13 NOTE — Telephone Encounter (Signed)
The message below that was originally taking was incorrect. Patient states she has been taking Omeprazole for a few months, mainly off and on as needed. She would take it when she had nausea and this medication would help. When she saw Dr Sullivan LoneGilbert last week he told her to continue taking Omeprazole but daily this time. Patient states it has not helped like it helped before. She gets nausea mainly when she wakes up in the morning before eating. Not so much after eating. No vomiting. She has been taking Omeprazole daily in the morning for at least a week. Nausea does not resolve after taking Omeprazole like it use to -aa

## 2017-06-14 ENCOUNTER — Emergency Department
Admission: EM | Admit: 2017-06-14 | Discharge: 2017-06-14 | Disposition: A | Discharge status: Home / Self Care | Payer: Medicare Other | Attending: Emergency Medicine | Admitting: Emergency Medicine

## 2017-06-14 ENCOUNTER — Emergency Department: Payer: Medicare Other

## 2017-06-14 ENCOUNTER — Encounter: Payer: Self-pay | Admitting: Radiology

## 2017-06-14 DIAGNOSIS — M47816 Spondylosis without myelopathy or radiculopathy, lumbar region: Secondary | ICD-10-CM

## 2017-06-14 DIAGNOSIS — K529 Noninfective gastroenteritis and colitis, unspecified: Secondary | ICD-10-CM

## 2017-06-14 DIAGNOSIS — J449 Chronic obstructive pulmonary disease, unspecified: Secondary | ICD-10-CM | POA: Diagnosis not present

## 2017-06-14 DIAGNOSIS — K5730 Diverticulosis of large intestine without perforation or abscess without bleeding: Secondary | ICD-10-CM | POA: Diagnosis not present

## 2017-06-14 MED ORDER — MORPHINE SULFATE (PF) 2 MG/ML IV SOLN
2.0000 mg | Freq: Once | INTRAVENOUS | Status: AC
  Administered 2017-06-14: 2 mg via INTRAVENOUS

## 2017-06-14 MED ORDER — ONDANSETRON HCL 4 MG/2ML IJ SOLN
4.0000 mg | Freq: Once | INTRAMUSCULAR | Status: AC
  Administered 2017-06-14: 4 mg via INTRAVENOUS

## 2017-06-14 MED ORDER — OXYCODONE-ACETAMINOPHEN 5-325 MG PO TABS: 1.0000 | ORAL_TABLET | ORAL | 0 refills | Status: DC | PRN

## 2017-06-14 MED ORDER — MORPHINE SULFATE (PF) 2 MG/ML IV SOLN
INTRAVENOUS | Status: DC
Start: 2017-06-14 — End: 2017-06-14
  Filled 2017-06-14: qty 1

## 2017-06-14 MED ORDER — ONDANSETRON HCL 4 MG/2ML IJ SOLN
INTRAMUSCULAR | Status: AC
  Filled 2017-06-14: qty 2

## 2017-06-14 MED ORDER — IOPAMIDOL (ISOVUE-300) INJECTION 61%
100.0000 mL | Freq: Once | INTRAVENOUS | Status: AC | PRN
  Administered 2017-06-14: 100 mL via INTRAVENOUS

## 2017-06-14 NOTE — ED Notes (Signed)
Pt states she has been nauseous and had right upper back pain for about a week. States it started with a sinus infection. Family at the bedside.

## 2017-06-14 NOTE — ED Provider Notes (Signed)
[facility] Emergency Department Provider Note    First MD Initiated Contact with Patient 06/14/17 [phone]     (approximate)  I have reviewed the triage vital signs and the nursing notes.   HISTORY  Chief Complaint Nausea; Abdominal Pain; and Back Pain   HPI Ajla L Kristen Schroeder is a 76 y.o. female ) medical conditions presents to the emergency department with mid to low back pain as well as upper abdominal discomfort times one week. Patient says her current pain score is 8 out of 10. Patient admits to diarrhea and nausea however no vomiting. Patient denies any fever  Past Medical History:  Diagnosis Date  . Arthritis   . COPD (chronic obstructive pulmonary disease) (HCC)   . Diabetes mellitus without complication (HCC)   . Dysrhythmia   . Heart murmur   . History of orthopnea   . Hypertension   . Hypothyroidism   . Neuropathy   . Oxygen dependent    3L  CONTINUOUS  . Pain CHRONIC BACK PAIN  . Shortness of breath dyspnea   . Wheezing     Patient Active Problem List   Diagnosis Date Noted  . Aneurysm (HCC) 07/10/2016  . Nonspecific abnormal finding 05/11/2015  . Allergic rhinitis 05/11/2015  . Anxiety 05/11/2015  . Bronchitis, chronic (HCC) 05/11/2015  . CCF (congestive cardiac failure) (HCC) 05/11/2015  . Clinical depression 05/11/2015  . 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 Essential (primary) hypertension 05/11/2015  . Acid reflux 05/11/2015  . HLD (hyperlipidemia) 05/11/2015  . Adult hypothyroidism 05/11/2015  . Cervical dysplasia, mild 05/11/2015  . Neuropathy 05/11/2015  . Adiposity 05/11/2015  . Obstructive apnea 05/11/2015  . Arthritis, degenerative 05/11/2015    Past Surgical History:  Procedure Laterality Date  . ABDOMINAL HYSTERECTOMY    . CATARACT EXTRACTION W/PHACO Left 07/03/2016   Procedure: CATARACT EXTRACTION PHACO AND INTRAOCULAR LENS PLACEMENT (IOC);  Surgeon: Galen ManilaWilliam Porfilio, MD;  Location: [facility]  ORS;  Service: Ophthalmology;  Laterality: Left;  Lot: [id number] H US: 00:40.1 AP%: 17.4 CDE:6.94  . TUBAL LIGATION      Prior to Admission medications   Medication Sig Start Date End Date Taking? Authorizing Provider  aspirin EC 81 MG tablet Take 81 mg by mouth daily.   Yes [provider]  DULERA 100-5 MCG/ACT AERO Inhale 2 puffs into the lungs 2 (two) times daily.  09/06/14  Yes [provider]  fluticasone (FLONASE) 50 MCG/ACT nasal spray Place 1 spray into both nostrils daily as needed.    Yes [provider]  furosemide (LASIX) 20 MG tablet Take 2 tablets (40 mg total) by mouth daily. 06/06/17  Yes Maple HudsonGilbert, Richard L Jr., MD  gabapentin (NEURONTIN) 300 MG capsule TAKE 2 CAPSULES BY MOUTH 2 TIME A DAY 09/28/16  Yes Maple HudsonGilbert, Richard L Jr., MD  HYDROcodone-acetaminophen [facility](NORCO) 10-325 MG tablet Take 1-2 tablets by mouth every 4 (four) hours as needed. 04/22/17  Yes Maple HudsonGilbert, Richard L Jr., MD  ipratropium-albuterol (DUONEB) 0.5-2.5 (3) MG/3ML SOLN Take 3 mLs by nebulization every 6 (six) hours as needed.    Yes [provider]  isosorbide mononitrate (IMDUR) 30 MG 24 hr tablet TAKE 1 TABLET (30 MG TOTAL) BY MOUTH ONCE DAILY. 06/13/15  Yes [provider]  levothyroxine (SYNTHROID, LEVOTHROID) 125 MCG tablet TAKE 1 TABLET BY MOUTH DAILY 05/20/17  Yes Maple HudsonGilbert, Richard L Jr., MD  lisinopril (PRINIVIL,ZESTRIL) 10 MG tablet TAKE 1 TABLET BY MOUTH EVERY DAY 03/15/17  Yes Maple HudsonGilbert, Richard L Jr.,  MD  loratadine (CLARITIN) 10 MG tablet TAKE 1 TABLET (10 MG TOTAL) BY MOUTH DAILY. 03/22/17  Yes Maple Hudson., MD  metFORMIN (GLUCOPHAGE) 1000 MG tablet Take 1 tablet (1,000 mg total) by mouth daily with breakfast. 04/22/17  Yes Maple Hudson., MD  MULTIPLE VITAMIN PO Take 1 tablet by mouth daily.    Yes [provider]  omeprazole (PRILOSEC) 40 MG capsule Take 1 capsule (40 mg total) by mouth daily. 02/13/17  Yes Maple Hudson., MD    ondansetron (ZOFRAN-ODT) 4 MG disintegrating tablet TAKE 1 TABLET BY MOUTH EVERY 8 HOURS AS NEEDED FOR NAUSEA AND VOMITING 06/13/17  Yes [name] M, PA-C  PARoxetine (PAXIL) 10 MG tablet Take 1 tablet (10 mg total) by mouth at bedtime. 06/06/17  Yes Maple Hudson., MD  pravastatin (PRAVACHOL) 10 MG tablet TAKE 1 TABLET (10 MG TOTAL) BY MOUTH DAILY. 05/20/17  Yes Maple Hudson., MD  SPIRIVA HANDIHALER 18 MCG inhalation capsule Place 18 mcg into inhaler and inhale daily.  09/06/14  Yes [provider]  amoxicillin-clavulanate (AUGMENTIN) 875-125 MG tablet Take 1 tablet by mouth 2 (two) times daily. Patient not taking: Reported on 06/13/2017 05/29/17   Margaretann Loveless, PA-C  DALIRESP 500 MCG TABS tablet Take 500 mcg by mouth daily.     [provider]  ONE TOUCH ULTRA TEST test strip USE 1 STRIP 2 TIMES DAILY AND AS NEEDED 06/28/16   Maple Hudson., MD    Allergies Codeine  Family History  Problem Relation Age of Onset  . Heart disease Mother   . Drug abuse Other   . Hypertension Other     Social History Social History  Substance Use Topics  . Smoking status: Former Smoker    Packs/day: 0.50    Years: 15.00  . Smokeless tobacco: Never Used  . Alcohol use No    Review of Systems Constitutional: No fever/chills Eyes: No visual changes. ENT: No sore throat. Cardiovascular: Denies chest pain. Respiratory: Denies shortness of breath. Gastrointestinal: Positive for abdominal pain.  No nausea, no vomiting.  No diarrhea.  No constipation. Genitourinary: Negative for dysuria. Musculoskeletal: Negative for neck pain.  Positive for back pain. Integumentary: Negative for rash. Neurological: Negative for headaches, focal weakness or numbness.   ____________________________________________   PHYSICAL EXAM:  VITAL SIGNS: ED Triage Vitals [06/13/17 2236]  Enc Vitals Group     BP (!) 161/74     Pulse Rate 88     Resp (!) 22     Temp  98.6 F (37 C)     Temp Source Oral     SpO2 96 %     Weight 105.7 kg (233 lb)     Height 1.676 m (5\' 6" )     Head Circumference      Peak Flow      Pain Score 6     Pain Loc      Pain Edu?      Excl. in GC?     Constitutional: Alert and oriented. Well appearing and in no acute distress. Eyes: Conjunctivae are normal.  Head: Atraumatic. Mouth/Throat: Mucous membranes are moist. Oropharynx non-erythematous. Neck: No stridor.  Cardiovascular: Normal rate, regular rhythm. Good peripheral circulation. Grossly normal heart sounds. Respiratory: Normal respiratory effort.  No retractions. Lungs CTAB. Gastrointestinal: Generalized abdominal discomfort with palpation No distention.  Musculoskeletal: No lower extremity tenderness nor edema. No gross deformities of extremities. Pain with lumbar paraspinal muscle  palpation Neurologic:  Normal speech and language. No gross focal neurologic deficits are appreciated.  Skin:  Skin is warm, dry and intact. No rash noted. Psychiatric: Mood and affect are normal. Speech and behavior are normal.  ____________________________________________   LABS (all labs ordered are listed, but only abnormal results are displayed)  Labs Reviewed  BASIC METABOLIC PANEL - Abnormal; Notable for the following:       Result Value   Sodium 146 (*)    Chloride 99 (*)    CO2 37 (*)    Glucose, Bld 119 (*)    Calcium 8.8 (*)    All other components within normal limits  CBC - Abnormal; Notable for the following:    RBC 3.76 (*)    Hemoglobin 9.9 (*)    HCT 31.5 (*)    MCHC 31.6 (*)    RDW 16.2 (*)    All other components within normal limits  TROPONIN I   ____________________________________________  EKG  ED ECG REPORT I, [place] N BROWN, the attending physician, personally viewed and interpreted this ECG.   Date: 06/15/2017  EKG Time: 10:40 PM  Rate: 88  Rhythm: Normal sinus rhythm  Axis: Normal  Intervals: Normal  ST&T Change:  None  ____________________________________________  RADIOLOGY I, [place] N BROWN, personally viewed and evaluated these images (plain radiographs) as part of my medical decision making, as well as reviewing the written report by the radiologist.  Dg Chest 2 View  Result Date: 06/13/2017 CLINICAL DATA:  COPD, nausea, back pain and pain under left breast. EXAM: CHEST  2 VIEW COMPARISON:  04/09/2017 FINDINGS: The heart size is within normal limits. Prominent central pulmonary arteries again noted with underlying chronic lung disease and hyperinflation. Prominent bibasilar scarring and atelectasis appears relatively stable. No definite focal airspace disease, edema, nodule or pleural fluid. The bony thorax is unremarkable. IMPRESSION: Stable chronic lung disease and evidence of probable pulmonary hypertension with prominent central pulmonary arteries. Electronically Signed   By: Irish Lack M.D.   On: 06/13/2017 23:03   Ct Abdomen Pelvis W Contrast  Result Date: 06/14/2017 CLINICAL DATA:  Nausea, mid back pain and LEFT chest pain for 1 week. History of diabetes, hypertension. EXAM: CT ABDOMEN AND PELVIS WITH CONTRAST TECHNIQUE: Multidetector CT imaging of the abdomen and pelvis was performed using the standard protocol following bolus administration of intravenous contrast. CONTRAST:  ISOVUE-300 IOPAMIDOL (ISOVUE-300) INJECTION 61% COMPARISON:  None. FINDINGS: LOWER CHEST: Partially imaged RIGHT middle lobe bronchiectasis. 7 mm LEFT lower lobe pulmonary nodule seen on series 4, image 4/17. Nodular scarring lingula. Included heart size is normal. No pericardial effusion. HEPATOBILIARY: Punctate calcified hepatic granuloma, liver is otherwise unremarkable. 2.1 cm gallstone at the neck without CT findings of acute cholecystitis. PANCREAS: Normal. SPLEEN: Normal. ADRENALS/URINARY TRACT: Kidneys are orthotopic, demonstrating symmetric enhancement. No nephrolithiasis, hydronephrosis or solid renal  masses. 13 mm cyst upper pole RIGHT kidney. Too small to characterize hypodensity upper pole RIGHT kidney. The unopacified ureters are normal in course and caliber. Delayed imaging through the kidneys demonstrates symmetric prompt contrast excretion within the proximal urinary collecting system. Urinary bladder is partially distended and unremarkable. Normal adrenal glands. STOMACH/BOWEL: Small hiatal hernia. The stomach, small and large bowel are normal in course and caliber without inflammatory changes, mild limited sensitivity without oral contrast. Moderate to severe descending and sigmoid colonic diverticulosis colonic air-fluid levels. Small amount of fluid in the small bowel. The appendix is not discretely identified, however there are no inflammatory changes in the  right lower quadrant. VASCULAR/LYMPHATIC: Aortoiliac vessels are normal in course and caliber. Mild calcific atherosclerosis. No lymphadenopathy by CT size criteria. REPRODUCTIVE: Status post hysterectomy. OTHER: No intraperitoneal free fluid or free air. MUSCULOSKELETAL: Nonacute. Small to moderate fat containing umbilical hernia. Narrow necked infraumbilical moderate to large ventral hernias containing fat and fluid, marginal calcification. Moderate sacroiliac osteoarthrosis. Multilevel severe degenerative change of lumbar spine better characterized on prior MRI. Severe canal stenosis L4-5. IMPRESSION: 1. Small and large bowel air-fluid levels suggesting enteritis. Colonic diverticulosis without diverticulitis. 2. Large ventral hernias containing fat and fluid.  No ascites. 3. **An incidental finding of potential clinical significance has been found. 7 mm LEFT lower lobe pulmonary nodule. Recommend CT chest at 6-12 months. This recommendation follows the consensus statement: Guidelines for Management of Small Pulmonary Nodules Detected on CT Scans: A Statement from the Fleischner Society as published in Radiology 2005; 237:395-400. ** Aortic  Atherosclerosis (ICD10-I70.0). Electronically Signed   By: Awilda Metro M.D.   On: 06/14/2017 02:12     Procedures   ____________________________________________   INITIAL IMPRESSION / ASSESSMENT AND PLAN / ED COURSE  Pertinent labs & imaging results that were available during my care of the patient were reviewed by me and considered in my medical decision making (see chart for details).  76 year old female presenting with abdominal and back pain. CT scan of the abdomen consistent with enteritis and degenerative joint disease of the lumbar spine. Patient received IV morphine emergency department with complete resolution of pain.      ____________________________________________  FINAL CLINICAL IMPRESSION(S) / ED DIAGNOSES  Final diagnoses:  Enteritis  Osteoarthritis of lumbar spine, unspecified spinal osteoarthritis complication status     MEDICATIONS GIVEN DURING THIS VISIT:  Medications  morphine 2 MG/ML injection 2 mg (2 mg Intravenous Given 06/14/17 0117)  ondansetron (ZOFRAN) injection 4 mg (4 mg Intravenous Given 06/14/17 0117)  iopamidol (ISOVUE-300) 61 % injection 100 mL (100 mLs Intravenous Contrast Given 06/14/17 0133)     NEW OUTPATIENT MEDICATIONS STARTED DURING THIS VISIT:  New Prescriptions   No medications on file    Modified Medications   No medications on file    Discontinued Medications   No medications on file     Note:  This document was prepared using Dragon voice recognition software and may include unintentional dictation errors.    Darci Current, MD 06/15/17 [phone]

## 2017-06-14 NOTE — ED Notes (Signed)
Reviewed d/c instructions, follow-up care, prescription with patient. Pt verbalized understanding.  

## 2017-06-18 NOTE — Telephone Encounter (Signed)
Appt if she worsens.

## 2017-06-18 NOTE — Telephone Encounter (Signed)
Spoke with patient. She is better now,. She actually ended up going to ER on 06/14/17, the day after we talked to the patient. She states she just felt so sick and could not hold her head up. Notes in the chart. She has appointment on 07/11/17 and said she will just keep this appointment and follow up then. She feels much better. I told patient I was sorry she had to go to ER and to let us know if we can help her in anyway before her next appointment.

## 2017-06-19 ENCOUNTER — Other Ambulatory Visit: Payer: Self-pay | Admitting: Neurosurgery

## 2017-06-19 DIAGNOSIS — I671 Cerebral aneurysm, nonruptured: Secondary | ICD-10-CM

## 2017-06-24 ENCOUNTER — Ambulatory Visit
Admission: RE | Admit: 2017-06-24 | Discharge: 2017-06-24 | Disposition: A | Discharge status: Home / Self Care | Payer: Medicare Other | Source: Ambulatory Visit | Attending: Neurosurgery | Admitting: Neurosurgery

## 2017-06-24 DIAGNOSIS — I607 Nontraumatic subarachnoid hemorrhage from unspecified intracranial artery: Secondary | ICD-10-CM | POA: Diagnosis not present

## 2017-06-24 DIAGNOSIS — I671 Cerebral aneurysm, nonruptured: Secondary | ICD-10-CM

## 2017-06-24 MED ORDER — IOPAMIDOL (ISOVUE-370) INJECTION 76%
75.0000 mL | Freq: Once | INTRAVENOUS | Status: AC | PRN
  Administered 2017-06-24: 75 mL via INTRAVENOUS

## 2017-07-11 ENCOUNTER — Encounter: Payer: Self-pay | Admitting: Family Medicine

## 2017-07-11 ENCOUNTER — Ambulatory Visit (INDEPENDENT_AMBULATORY_CARE_PROVIDER_SITE_OTHER): Payer: Medicare Other | Admitting: Family Medicine

## 2017-07-11 VITALS — BP 148/76 | HR 84 | Temp 98.2°F | Resp 16 | Wt 232.0 lb

## 2017-07-11 DIAGNOSIS — F419 Anxiety disorder, unspecified: Secondary | ICD-10-CM

## 2017-07-11 DIAGNOSIS — I671 Cerebral aneurysm, nonruptured: Secondary | ICD-10-CM | POA: Diagnosis not present

## 2017-07-11 DIAGNOSIS — Z23 Encounter for immunization: Secondary | ICD-10-CM

## 2017-07-11 MED ORDER — LORAZEPAM 0.5 MG PO TABS: 0.5000 mg | ORAL_TABLET | Freq: Two times a day (BID) | ORAL | 5 refills | Status: DC | PRN

## 2017-07-11 NOTE — Progress Notes (Signed)
Patient: Kristen Schroeder Female    DOB: [birth year]   76 y.o.   MRN: [id number] Visit Date: 07/11/2017  Today's Provider: Megan Mansichard Gilbert Jr, MD   Chief Complaint  Patient presents with  . Follow-up    anxiety and COPD   Subjective:    HPI Pt is here for a follow up of anxiety. She was started on Paxil. She reports that she is still having panic attacks and racing thoughts. She is not having any known side effects to the medication, except she does noticed that after she takes it she goes to sleep. She reports that her breathing is about the same. No CP or any new symptoms. [facility] 2[facility]9 07/11/2017 10/17/2016 07/10/2016 06/15/2015  Decreased Interest 0 0 0 1  Down, Depressed, Hopeless 0 0 1 1  PHQ - 2 Score 0 0 1 2  Altered sleeping 0 - - 0  Tired, decreased energy 3 - - 3  Change in appetite 0 - - 1  Feeling bad or failure about yourself  0 - - 1  Trouble concentrating 0 - - 0  Moving slowly or fidgety/restless 0 - - 0  Suicidal thoughts 0 - - 0  PHQ-9 Score 3 - - 7  Difficult doing work/chores Not difficult at all - - Somewhat difficult         Allergies  Allergen Reactions  . Codeine Nausea And Vomiting and Nausea Only     Current Outpatient Prescriptions:  .  aspirin EC 81 MG tablet, Take 81 mg by mouth daily., Disp: , Rfl:  .  DALIRESP 500 MCG TABS tablet, Take 500 mcg by mouth daily. , Disp: , Rfl:  .  DULERA 100-5 MCG/ACT AERO, Inhale 2 puffs into the lungs 2 (two) times daily. , Disp: , Rfl: 4 .  fluticasone (FLONASE) 50 MCG/ACT nasal spray, Place 1 spray into both nostrils daily as needed. , Disp: , Rfl:  .  furosemide (LASIX) 20 MG tablet, Take 2 tablets (40 mg total) by mouth daily., Disp: 180 tablet, Rfl: 3 .  gabapentin (NEURONTIN) 300 MG capsule, TAKE 2 CAPSULES BY MOUTH 2 TIME A DAY, Disp: 360 capsule, Rfl: 3 .  HYDROcodone-acetaminophen (NORCO) 10-325 MG tablet, Take 1-2 tablets by mouth every 4 (four) hours as needed., Disp: 200 tablet, Rfl:  0 .  ipratropium-albuterol (DUONEB) 0.5-2.5 (3) MG/3ML SOLN, Take 3 mLs by nebulization every 6 (six) hours as needed. , Disp: , Rfl:  .  isosorbide mononitrate (IMDUR) 30 MG 24 hr tablet, TAKE 1 TABLET (30 MG TOTAL) BY MOUTH ONCE DAILY., Disp: , Rfl:  .  levothyroxine (SYNTHROID, LEVOTHROID) 125 MCG tablet, TAKE 1 TABLET BY MOUTH DAILY, Disp: 90 tablet, Rfl: 3 .  lisinopril (PRINIVIL,ZESTRIL) 10 MG tablet, TAKE 1 TABLET BY MOUTH EVERY DAY, Disp: 90 tablet, Rfl: 3 .  loratadine (CLARITIN) 10 MG tablet, TAKE 1 TABLET (10 MG TOTAL) BY MOUTH DAILY., Disp: 90 tablet, Rfl: 3 .  metFORMIN (GLUCOPHAGE) 1000 MG tablet, Take 1 tablet (1,000 mg total) by mouth daily with breakfast., Disp: 90 tablet, Rfl: 3 .  MULTIPLE VITAMIN PO, Take 1 tablet by mouth daily. , Disp: , Rfl:  .  omeprazole (PRILOSEC) 40 MG capsule, Take 1 capsule (40 mg total) by mouth daily., Disp: 90 capsule, Rfl: 1 .  ondansetron (ZOFRAN-ODT) 4 MG disintegrating tablet, TAKE 1 TABLET BY MOUTH EVERY 8 HOURS AS NEEDED FOR NAUSEA AND VOMITING, Disp: 20 tablet, Rfl: 3 .  ONE TOUCH ULTRA TEST test strip, USE 1 STRIP 2 TIMES DAILY AND AS NEEDED, Disp: 100 each, Rfl: 12 .  oxyCODONE-acetaminophen (ROXICET) 5-325 MG tablet, Take 1 tablet by mouth every 4 (four) hours as needed for severe pain., Disp: 20 tablet, Rfl: 0 .  PARoxetine (PAXIL) 10 MG tablet, Take 1 tablet (10 mg total) by mouth at bedtime., Disp: 30 tablet, Rfl: 3 .  pravastatin (PRAVACHOL) 10 MG tablet, TAKE 1 TABLET (10 MG TOTAL) BY MOUTH DAILY., Disp: 90 tablet, Rfl: 3 .  SPIRIVA HANDIHALER 18 MCG inhalation capsule, Place 18 mcg into inhaler and inhale daily. , Disp: , Rfl: 1 .  amoxicillin-clavulanate (AUGMENTIN) 875-125 MG tablet, Take 1 tablet by mouth 2 (two) times daily. (Patient not taking: Reported on 06/13/2017), Disp: 20 tablet, Rfl: 0  Review of Systems  Constitutional: Negative.   HENT: Negative.   Eyes: Negative.   Respiratory: Positive for shortness of breath (no more  than usual).   Cardiovascular: Negative.   Gastrointestinal: Negative.   Endocrine: Negative.   Genitourinary: Negative.   Musculoskeletal: Negative.   Allergic/Immunologic: Negative.   Neurological: Negative.   Hematological: Negative.   Psychiatric/Behavioral: Negative.     Social History  Substance Use Topics  . Smoking status: Former Smoker    Packs/day: 0.50    Years: 15.00  . Smokeless tobacco: Never Used  . Alcohol use No   Objective:   BP (!) 148/76 (BP Location: Left Arm, Patient Position: Sitting, Cuff Size: Large)   Pulse 84   Temp 98.2 F (36.8 C) (Oral)   Resp 16   Wt 232 lb (105.2 kg)   SpO2 90%   BMI 37.45 kg/m  Vitals:   07/11/17 1130  BP: (!) 148/76  Pulse: 84  Resp: 16  Temp: 98.2 F (36.8 C)  TempSrc: Oral  SpO2: 90%  Weight: 232 lb (105.2 kg)     Physical Exam  Constitutional: She is oriented to person, place, and time. She appears well-developed and well-nourished.  HENT:  Head: Normocephalic and atraumatic.  Eyes: Conjunctivae are normal.  Neck: No thyromegaly present.  Cardiovascular: Normal rate, regular rhythm and normal heart sounds.   Pulmonary/Chest: Effort normal and breath sounds normal.  Abdominal: Soft.  Neurological: She is alert and oriented to person, place, and time.  Skin: Skin is warm and dry.  Psychiatric: She has a normal mood and affect. Her behavior is normal. Judgment and thought content normal.        Assessment & Plan:     1. Anxiety RTC 1-2 weeks. - LORazepam (ATIVAN) 0.5 MG tablet; Take 1 tablet (0.5 mg total) by mouth 2 (two) times daily as needed for anxiety.  Dispense: 60 tablet; Refill: 5  2. Need for influenza vaccination  - Flu vaccine HIGH DOSE PF .3.COPD 4.Obesity  I have done the exam and reviewed the chart and it is accurate to the best of my knowledge. Dentist has been used and  any errors in dictation or transcription are unintentional. Julieanne Manson M.D. [facility]       Megan Mans, MD  [facility]

## 2017-07-12 ENCOUNTER — Other Ambulatory Visit: Payer: Self-pay | Admitting: Family Medicine

## 2017-07-15 DIAGNOSIS — J449 Chronic obstructive pulmonary disease, unspecified: Secondary | ICD-10-CM | POA: Diagnosis not present

## 2017-07-30 DIAGNOSIS — R0602 Shortness of breath: Secondary | ICD-10-CM | POA: Diagnosis not present

## 2017-07-30 DIAGNOSIS — R0902 Hypoxemia: Secondary | ICD-10-CM | POA: Diagnosis not present

## 2017-07-30 DIAGNOSIS — I10 Essential (primary) hypertension: Secondary | ICD-10-CM | POA: Diagnosis not present

## 2017-07-30 DIAGNOSIS — E784 Other hyperlipidemia: Secondary | ICD-10-CM | POA: Diagnosis not present

## 2017-07-30 DIAGNOSIS — J8410 Pulmonary fibrosis, unspecified: Secondary | ICD-10-CM | POA: Diagnosis not present

## 2017-08-14 DIAGNOSIS — J449 Chronic obstructive pulmonary disease, unspecified: Secondary | ICD-10-CM | POA: Diagnosis not present

## 2017-08-26 ENCOUNTER — Ambulatory Visit (INDEPENDENT_AMBULATORY_CARE_PROVIDER_SITE_OTHER): Payer: Medicare Other | Admitting: Family Medicine

## 2017-08-26 ENCOUNTER — Encounter: Payer: Self-pay | Admitting: Family Medicine

## 2017-08-26 VITALS — BP 152/68 | HR 74 | Temp 97.8°F | Resp 16 | Wt 230.0 lb

## 2017-08-26 DIAGNOSIS — J449 Chronic obstructive pulmonary disease, unspecified: Secondary | ICD-10-CM

## 2017-08-26 DIAGNOSIS — M545 Low back pain: Secondary | ICD-10-CM

## 2017-08-26 DIAGNOSIS — E118 Type 2 diabetes mellitus with unspecified complications: Secondary | ICD-10-CM

## 2017-08-26 DIAGNOSIS — I10 Essential (primary) hypertension: Secondary | ICD-10-CM | POA: Diagnosis not present

## 2017-08-26 DIAGNOSIS — M5136 Other intervertebral disc degeneration, lumbar region: Secondary | ICD-10-CM | POA: Diagnosis not present

## 2017-08-26 DIAGNOSIS — G8929 Other chronic pain: Secondary | ICD-10-CM

## 2017-08-26 LAB — POCT GLYCOSYLATED HEMOGLOBIN (HGB A1C): Hemoglobin A1C: 5.4

## 2017-08-26 MED ORDER — LISINOPRIL 20 MG PO TABS: 20.0000 mg | ORAL_TABLET | Freq: Every day | ORAL | 3 refills | Status: DC

## 2017-08-26 MED ORDER — METHYLPREDNISOLONE ACETATE 80 MG/ML IJ SUSP
80.0000 mg | Freq: Once | INTRAMUSCULAR | Status: AC
  Administered 2017-08-26: 80 mg via INTRAMUSCULAR

## 2017-08-26 NOTE — Progress Notes (Signed)
Patient: Kristen KaufmanRuthie L Schroeder Female    DOB: Dec 01, 1940   76 y.o.   MRN: [id number] Visit Date: 08/26/2017  Today's Provider: Megan Mansichard Gilbert Jr, MD   Chief Complaint  Patient presents with  . Anxiety  . Diabetes  . Hypertension   Subjective:      HPI  Diabetes Mellitus Type II, Follow-up:   Lab Results  Component Value Date   HGBA1C 5.1 04/22/2017   HGBA1C 5.2 12/25/2016   HGBA1C 5.4 05/03/2016    Last seen for diabetes 4 months ago.  Management since then includes decreased metformin to 1 tablet daily. She reports good compliance with treatment. She is not having side effects.  Home blood sugar records: 140-150's  Episodes of hypoglycemia? no   Current Insulin Regimen: n/a   Pertinent Labs:    Component Value Date/Time   CHOL 147 04/22/2017 1231   TRIG 100 04/22/2017 1231   HDL 67 04/22/2017 1231   LDLCALC 60 04/22/2017 1231   CREATININE 0.57 06/13/2017 2235    Wt Readings from Last 3 Encounters:  08/26/17 230 lb (104.3 kg)  07/11/17 232 lb (105.2 kg)  06/13/17 233 lb (105.7 kg)   ------------------------------------------------------------------------    Hypertension, follow-up:  BP Readings from Last 3 Encounters:  08/26/17 (!) 152/68  07/11/17 (!) 148/76  06/14/17 (!) 155/63    She was last seen for hypertension 4 months ago.  BP at that visit was 148/76. Management since that visit includes none. She reports good compliance with treatment. She is not having side effects.  She is not exercising. Outside blood pressures are 140-150's/60-70's. Patient denies chest pain, chest pressure/discomfort, claudication, exertional chest pressure/discomfort, fatigue, irregular heart beat, lower extremity edema, near-syncope, orthopnea, palpitations, paroxysmal nocturnal dyspnea, syncope and tachypnea.    Wt Readings from Last 3 Encounters:  08/26/17 230 lb (104.3 kg)  07/11/17 232 lb (105.2 kg)  06/13/17 233 lb (105.7 kg)    ------------------------------------------------------------------------  Anxiety-  started Lorazepam last OV. Pt reports that she is taking the lorazepam about twice a week, sometimes 3 times a week. But seems to be helping her anxiety.    Pt reports that she has been having sinus congestion and headache. She reports that some mornings she feels like she needs to blow her nose all day and others she's ok. She is taking Flonase and Claritin daily. She reports that the headache is across the front of her forehead and around her right eye. She reports that this is the eye she had the cataract taken off that has not been too long ago.    Allergies  Allergen Reactions  . Codeine Nausea And Vomiting and Nausea Only     Current Outpatient Prescriptions:  .  aspirin EC 81 MG tablet, Take 81 mg by mouth daily., Disp: , Rfl:  .  DALIRESP 500 MCG TABS tablet, Take 500 mcg by mouth daily. , Disp: , Rfl:  .  DULERA 100-5 MCG/ACT AERO, Inhale 2 puffs into the lungs 2 (two) times daily. , Disp: , Rfl: 4 .  fluticasone (FLONASE) 50 MCG/ACT nasal spray, Place 1 spray into both nostrils daily as needed. , Disp: , Rfl:  .  furosemide (LASIX) 20 MG tablet, Take 2 tablets (40 mg total) by mouth daily., Disp: 180 tablet, Rfl: 3 .  gabapentin (NEURONTIN) 300 MG capsule, TAKE 2 CAPSULES BY MOUTH 2 TIME A DAY, Disp: 360 capsule, Rfl: 3 .  HYDROcodone-acetaminophen (NORCO) 10-325 MG tablet, Take 1-2 tablets by  mouth every 4 (four) hours as needed., Disp: 200 tablet, Rfl: 0 .  ipratropium-albuterol (DUONEB) 0.5-2.5 (3) MG/3ML SOLN, Take 3 mLs by nebulization every 6 (six) hours as needed. , Disp: , Rfl:  .  isosorbide mononitrate (IMDUR) 30 MG 24 hr tablet, TAKE 1 TABLET (30 MG TOTAL) BY MOUTH ONCE DAILY., Disp: , Rfl:  .  levothyroxine (SYNTHROID, LEVOTHROID) 125 MCG tablet, TAKE 1 TABLET BY MOUTH DAILY, Disp: 90 tablet, Rfl: 3 .  lisinopril (PRINIVIL,ZESTRIL) 10 MG tablet, TAKE 1 TABLET BY MOUTH EVERY DAY,  Disp: 90 tablet, Rfl: 3 .  loratadine (CLARITIN) 10 MG tablet, TAKE 1 TABLET (10 MG TOTAL) BY MOUTH DAILY., Disp: 90 tablet, Rfl: 3 .  LORazepam (ATIVAN) 0.5 MG tablet, Take 1 tablet (0.5 mg total) by mouth 2 (two) times daily as needed for anxiety., Disp: 60 tablet, Rfl: 5 .  metFORMIN (GLUCOPHAGE) 1000 MG tablet, TAKE 1 TABLET BY MOUTH TWICE A DAY, Disp: 180 tablet, Rfl: 3 .  MULTIPLE VITAMIN PO, Take 1 tablet by mouth daily. , Disp: , Rfl:  .  omeprazole (PRILOSEC) 40 MG capsule, Take 1 capsule (40 mg total) by mouth daily., Disp: 90 capsule, Rfl: 1 .  ondansetron (ZOFRAN-ODT) 4 MG disintegrating tablet, TAKE 1 TABLET BY MOUTH EVERY 8 HOURS AS NEEDED FOR NAUSEA AND VOMITING, Disp: 20 tablet, Rfl: 3 .  ONE TOUCH ULTRA TEST test strip, USE 1 STRIP 2 TIMES DAILY AND AS NEEDED, Disp: 100 each, Rfl: 12 .  oxyCODONE-acetaminophen (ROXICET) 5-325 MG tablet, Take 1 tablet by mouth every 4 (four) hours as needed for severe pain., Disp: 20 tablet, Rfl: 0 .  PARoxetine (PAXIL) 10 MG tablet, Take 1 tablet (10 mg total) by mouth at bedtime., Disp: 30 tablet, Rfl: 3 .  pravastatin (PRAVACHOL) 10 MG tablet, TAKE 1 TABLET (10 MG TOTAL) BY MOUTH DAILY., Disp: 90 tablet, Rfl: 3 .  SPIRIVA HANDIHALER 18 MCG inhalation capsule, Place 18 mcg into inhaler and inhale daily. , Disp: , Rfl: 1  Review of Systems  Constitutional: Negative.   HENT: Positive for congestion, rhinorrhea, sinus pain, sinus pressure and sneezing.   Eyes: Negative.   Respiratory: Positive for cough and shortness of breath (no more than normal).   Cardiovascular: Negative.   Gastrointestinal: Negative.   Endocrine: Negative.   Genitourinary: Negative.   Musculoskeletal: Negative.   Skin: Negative.   Allergic/Immunologic: Negative.   Neurological: Negative.   Hematological: Negative.   Psychiatric/Behavioral: Negative.     Social History  Substance Use Topics  . Smoking status: Former Smoker    Packs/day: 0.50    Years: 15.00  .  Smokeless tobacco: Never Used  . Alcohol use No   Objective:   BP (!) 152/68 (BP Location: Left Arm, Patient Position: Sitting, Cuff Size: Large)   Pulse 74   Temp 97.8 F (36.6 C) (Oral)   Resp 16   Wt 230 lb (104.3 kg)   SpO2 90%   BMI 37.12 kg/m  Vitals:   08/26/17 1134  BP: (!) 152/68  Pulse: 74  Resp: 16  Temp: 97.8 F (36.6 C)  TempSrc: Oral  SpO2: 90%  Weight: 230 lb (104.3 kg)     Physical Exam  Constitutional: She is oriented to person, place, and time. She appears well-developed and well-nourished.  Obese BF NAD.  HENT:  Head: Normocephalic and atraumatic.  Right Ear: External ear normal.  Left Ear: External ear normal.  Nose: Nose normal.  Eyes: Pupils are equal, round, and reactive  to light. Conjunctivae and EOM are normal.  Neck: Normal range of motion. Neck supple.  Cardiovascular: Normal rate, regular rhythm, normal heart sounds and intact distal pulses.   Pulmonary/Chest: Effort normal and breath sounds normal.  Abdominal: Soft.  Musculoskeletal: Normal range of motion.  Neurological: She is alert and oriented to person, place, and time. She has normal reflexes.  Skin: Skin is warm and dry.  Psychiatric: She has a normal mood and affect. Her behavior is normal. Judgment and thought content normal.    Diabetic Foot Exam - Simple   Simple Foot Form Diabetic Foot exam was performed with the following findings:  Yes 08/26/2017 11:59 AM  Visual Inspection No deformities, no ulcerations, no other skin breakdown bilaterally:  Yes Sensation Testing See comments:  Yes Pulse Check Posterior Tibialis and Dorsalis pulse intact bilaterally:  Yes Comments Decreased sensation on the left vs the right.           Assessment & Plan:     1. Type 2 diabetes mellitus with complication, without long-term current use of insulin (HCC) Now prediabetic. - POCT HgB A1C 5.4 today.  2. Essential hypertension Not to goal. Increase lisinopril to 20 mg daily  follow up in 1-2 month.  - lisinopril (PRINIVIL,ZESTRIL) 20 MG tablet; Take 1 tablet (20 mg total) by mouth daily.  Dispense: 90 tablet; Refill: 3  3. Chronic obstructive pulmonary disease, unspecified COPD type (HCC)   4. DDD (degenerative disc disease), lumbar  - methylPREDNISolone acetate (DEPO-MEDROL) injection 80 mg; Inject 1 mL (80 mg total) into the muscle once.  5. Chronic low back pain, unspecified back pain laterality, with sciatica presence unspecified  - methylPREDNISolone acetate (DEPO-MEDROL) injection 80 mg; Inject 1 mL (80 mg total) into the muscle once.      HPI, Exam, and A&P Transcribed under the direction and in the presence of Richard L. Wendelyn Breslow, MD  Electronically Signed: Silvio Pate, CMA  I have done the exam and reviewed the above chart and it is accurate to the best of my knowledge. Dentist has been used in this note in any air is in the dictation or transcription are unintentional.  Megan Mans, MD  [facility]

## 2017-08-28 ENCOUNTER — Other Ambulatory Visit: Payer: Self-pay | Admitting: Family Medicine

## 2017-09-04 IMAGING — CR DG CHEST 2V
2 series · 2 of 2 positions shown · non-contrast
Comparison: 04/09/2017

CLINICAL DATA: COPD, nausea, back pain and pain under left breast.

EXAM:
CHEST  2 VIEW

[chest lat]
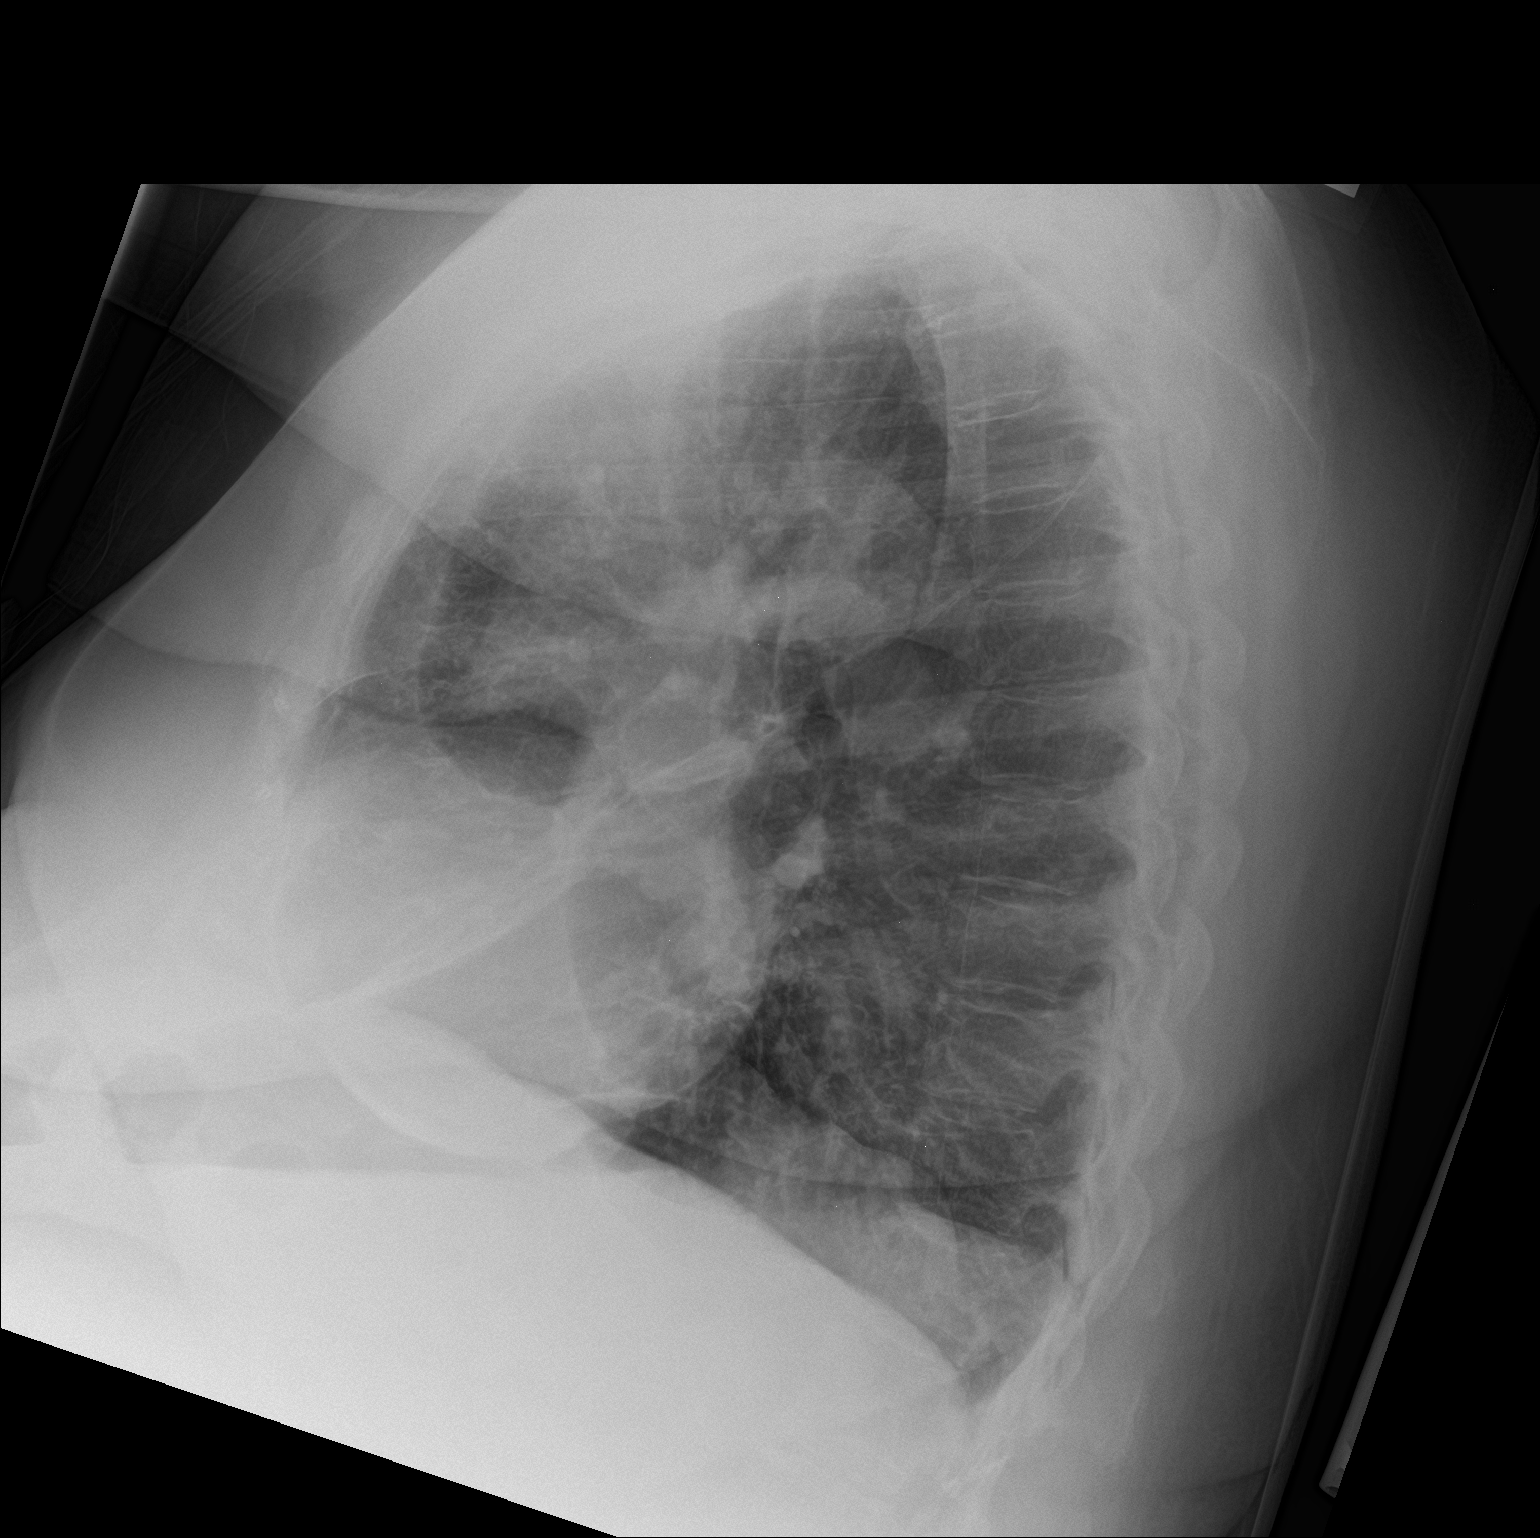

[chest ap]
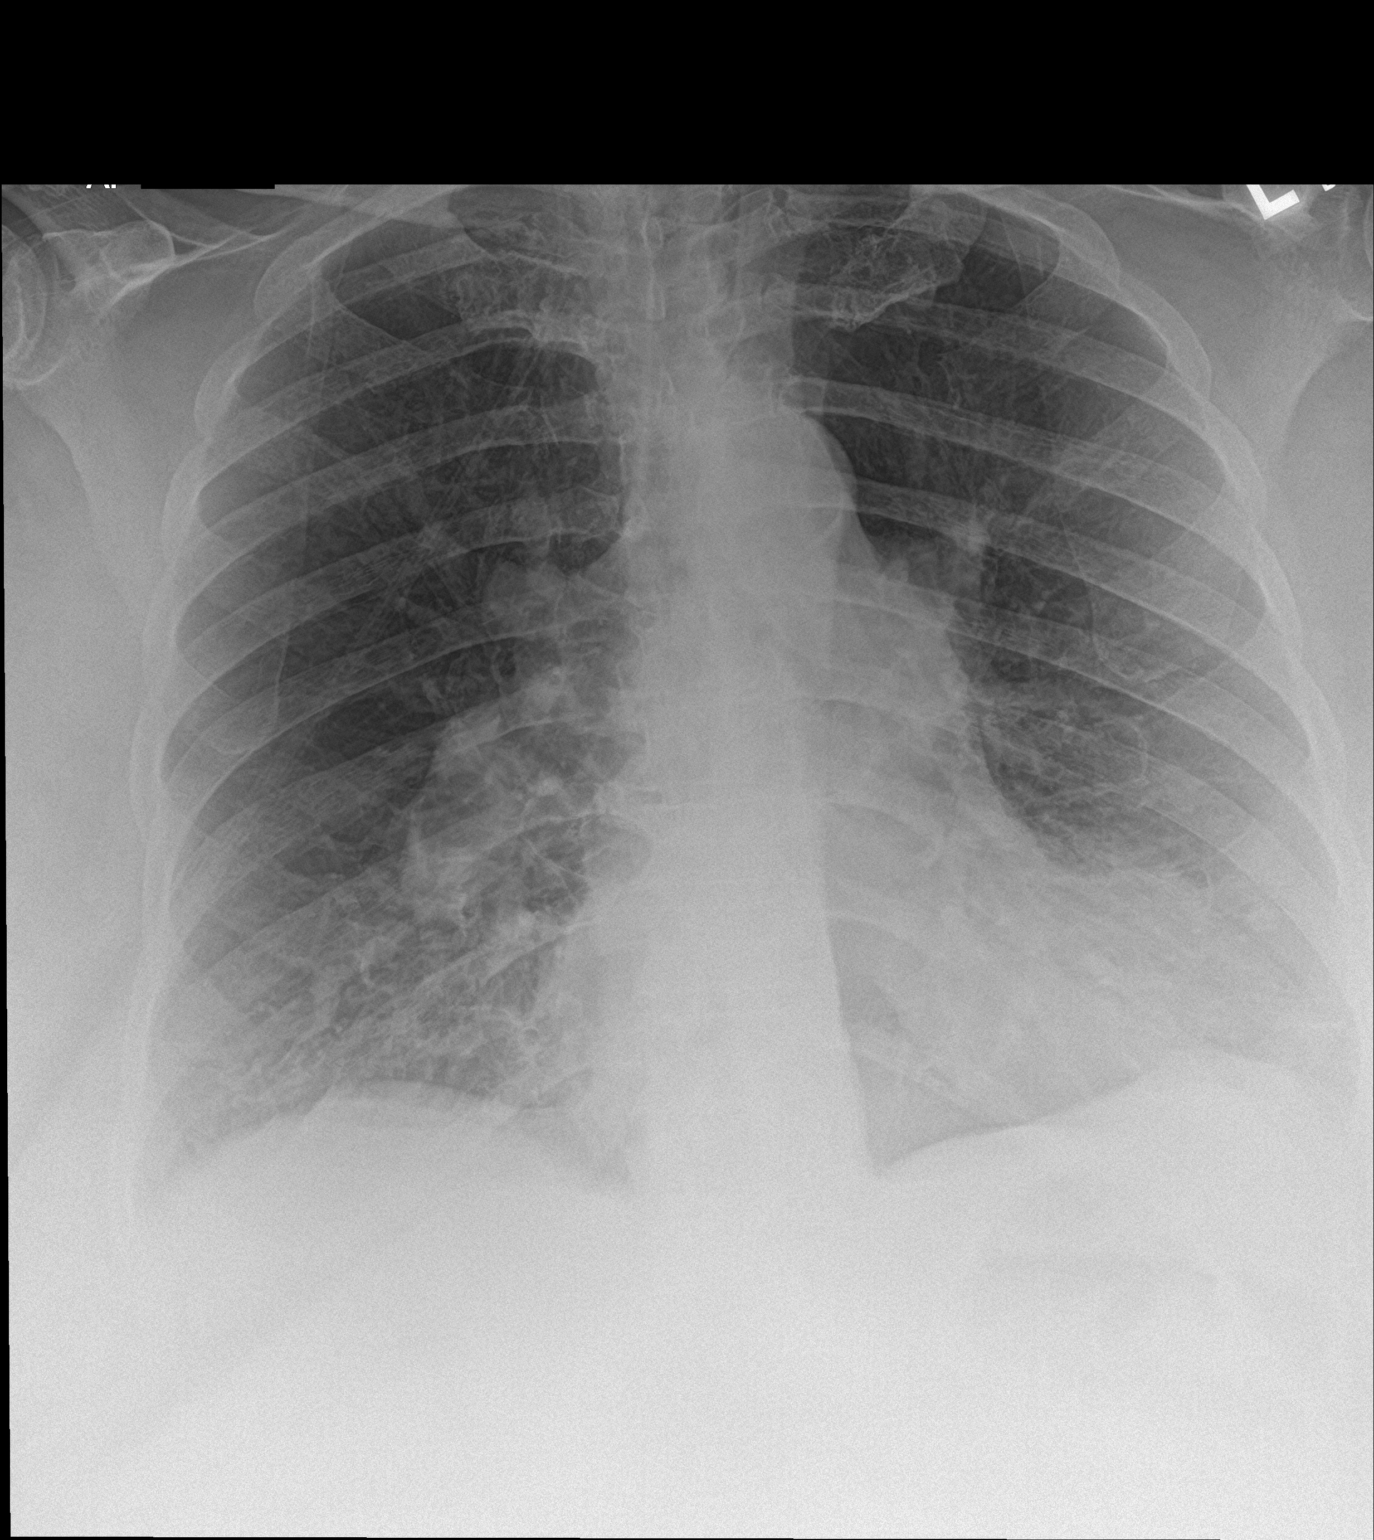

[2 of 2 positions shown; findings below may reference images not displayed]

FINDINGS: The heart size is within normal limits. Prominent central pulmonary
arteries again noted with underlying chronic lung disease and
hyperinflation. Prominent bibasilar scarring and atelectasis appears
relatively stable. No definite focal airspace disease, edema, nodule
or pleural fluid. The bony thorax is unremarkable.
IMPRESSION: Stable chronic lung disease and evidence of probable pulmonary
hypertension with prominent central pulmonary arteries.

## 2017-09-05 IMAGING — CT CT ABD-PELV W/ CM
2 of 5 series · 16 of 46 positions shown, 18 images · IV contrast (APPLIED)
Comparison: None.

CLINICAL DATA: Nausea, mid back pain and LEFT chest pain for 1
week. History of diabetes, hypertension.

EXAM:
CT ABDOMEN AND PELVIS WITH CONTRAST
TECHNIQUE: Multidetector CT imaging of the abdomen and pelvis was performed
using the standard protocol following bolus administration of
intravenous contrast.
CONTRAST:  100mL PAVBPI-OJJ IOPAMIDOL (PAVBPI-OJJ) INJECTION 61%

[Series 2: routine abd/pel with · axial · 0.96mm/px · z∈[-758,-368]mm · 13 of 88 slices shown, 15 images]
[im 5/88  soft-tissue]
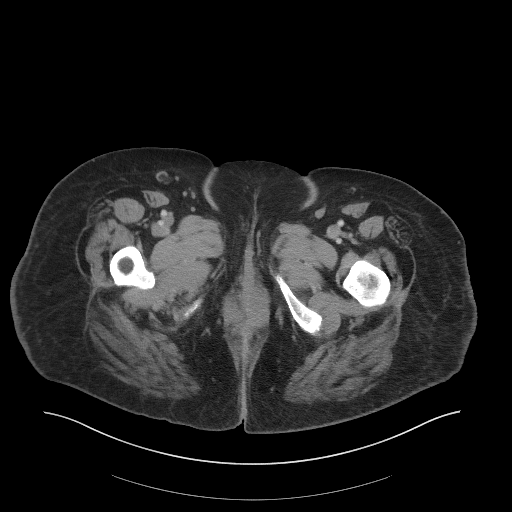
[im 5/88  bone]
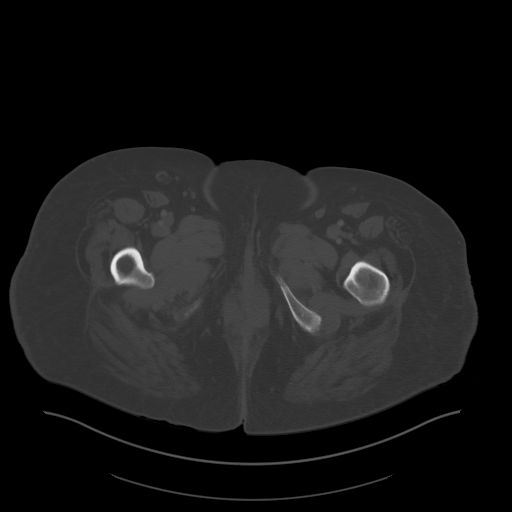
[im 14/88  soft-tissue]
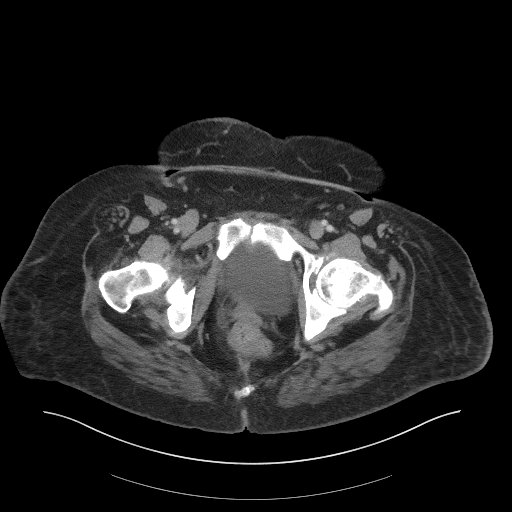
[im 18/88  soft-tissue]
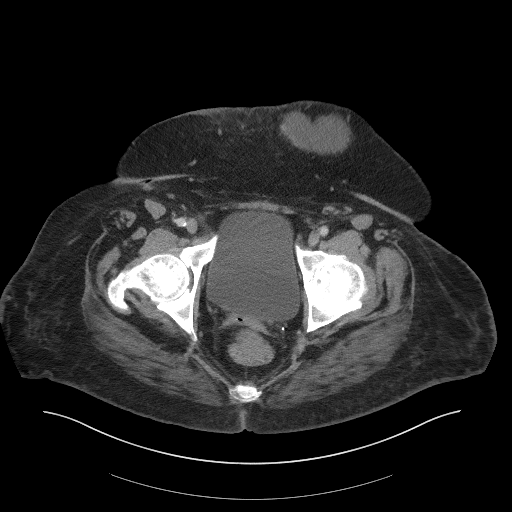
[im 27/88  soft-tissue]
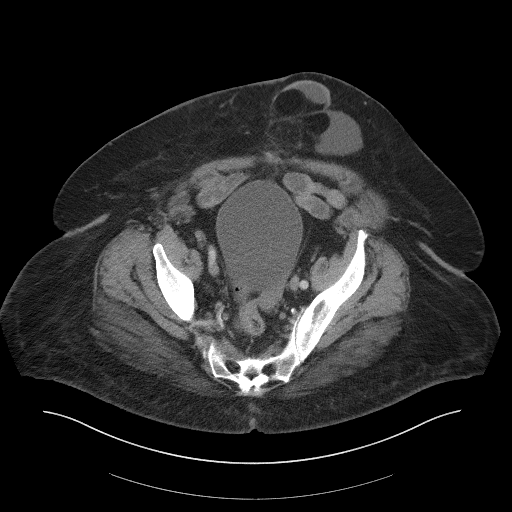
[im 31/88  soft-tissue]
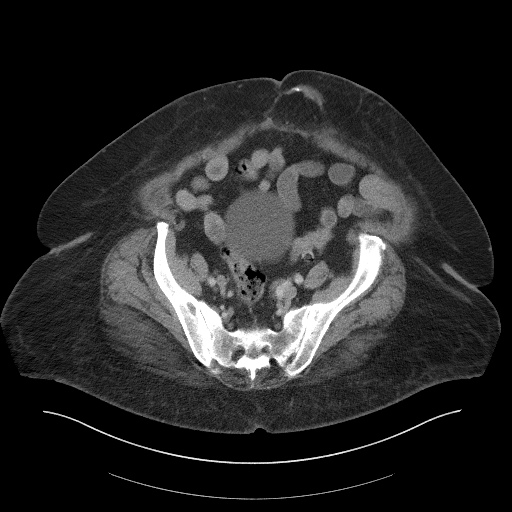
[im 40/88  soft-tissue]
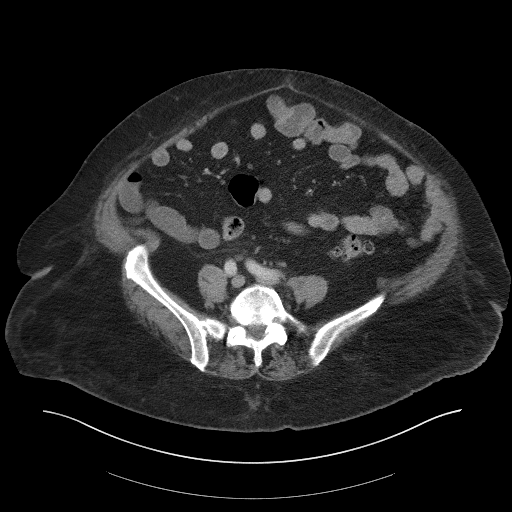
[im 44/88  soft-tissue]
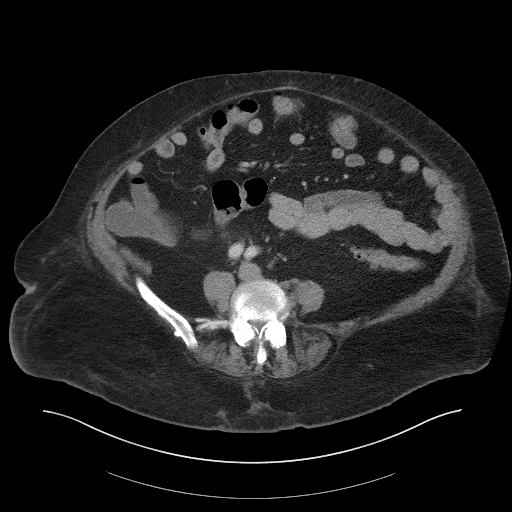
[im 48/88  soft-tissue]
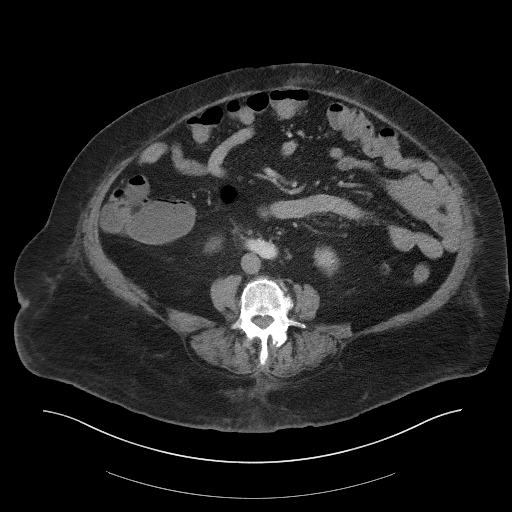
[im 57/88  soft-tissue]
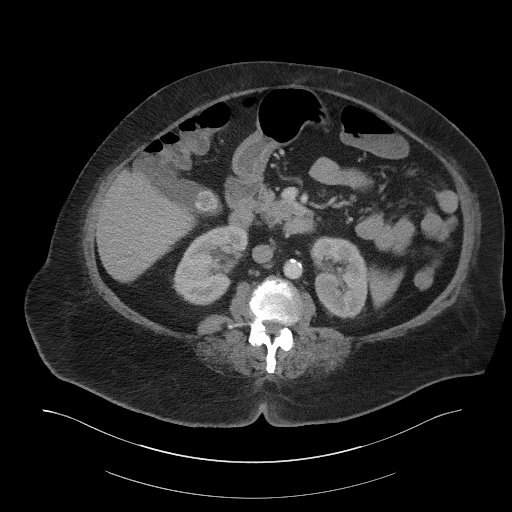
[im 57/88  bone]
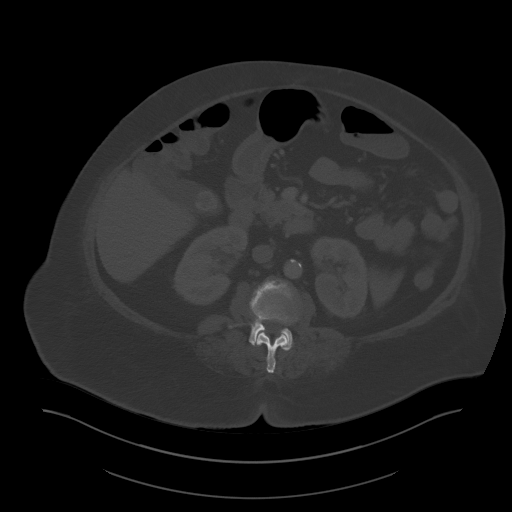
[im 61/88  soft-tissue]
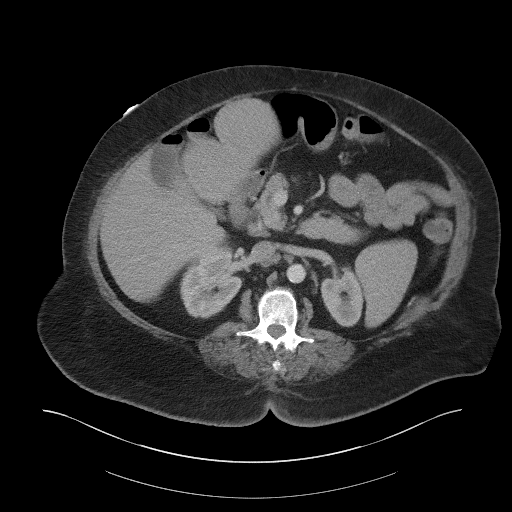
[im 70/88  soft-tissue]
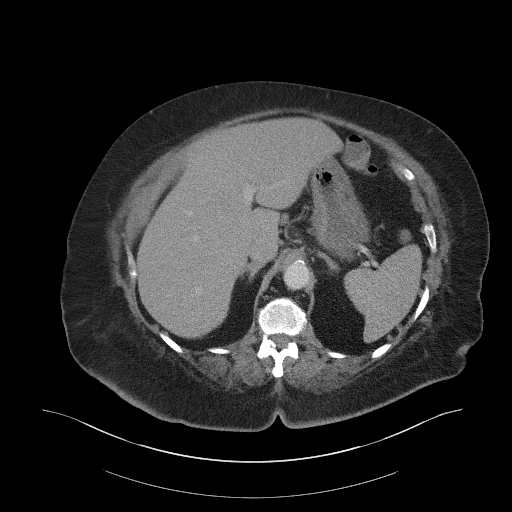
[im 74/88  soft-tissue]
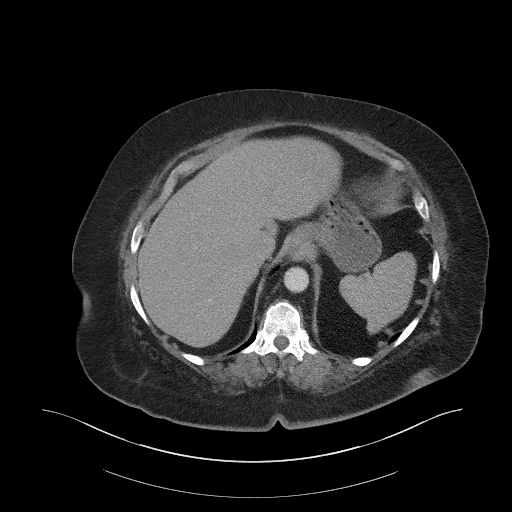
[im 83/88  soft-tissue]
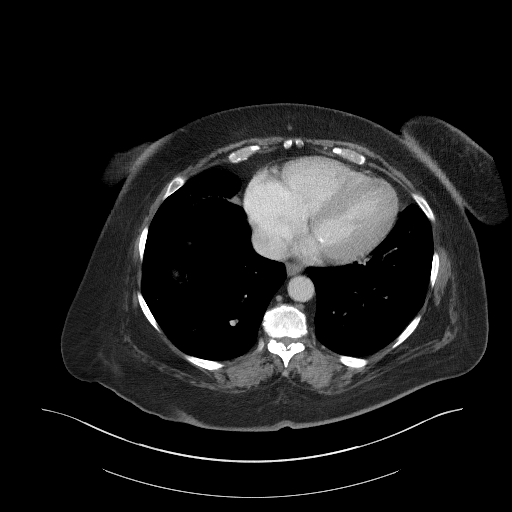

[Series 5: coronal st · coronal · 0.83mm/px · 3 of 115 slices shown]
[im 39/115  soft-tissue]
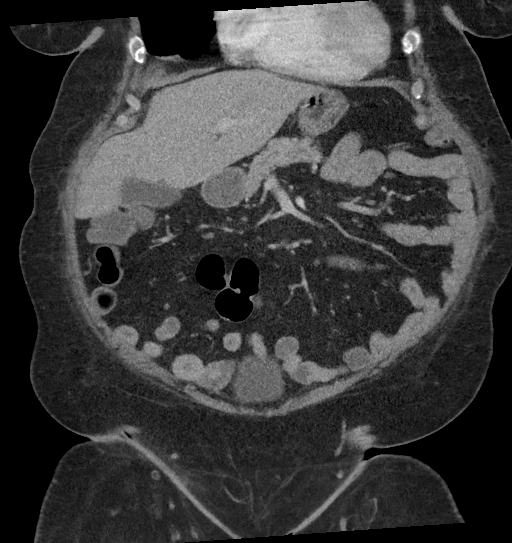
[im 51/115  soft-tissue]
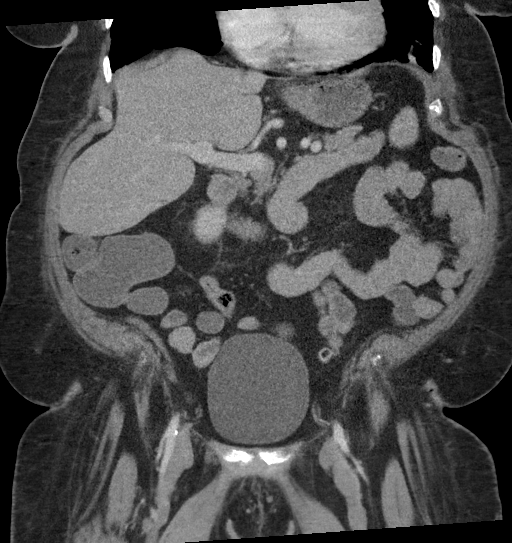
[im 64/115  soft-tissue]
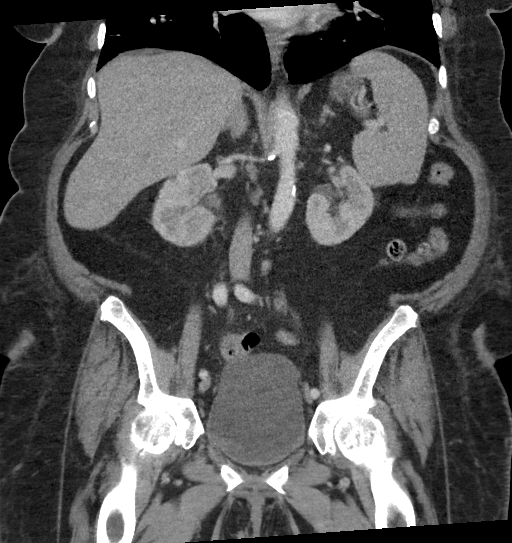

[16 of 46 positions shown; findings below may reference images not displayed]

FINDINGS: LOWER CHEST: Partially imaged RIGHT middle lobe bronchiectasis. 7 mm
LEFT lower lobe pulmonary nodule seen on series 4, image [DATE].
Nodular scarring lingula. Included heart size is normal. No
pericardial effusion.

HEPATOBILIARY: Punctate calcified hepatic granuloma, liver is
otherwise unremarkable. 2.1 cm gallstone at the neck without CT
findings of acute cholecystitis.

PANCREAS: Normal.

SPLEEN: Normal.

ADRENALS/URINARY TRACT: Kidneys are orthotopic, demonstrating
symmetric enhancement. No nephrolithiasis, hydronephrosis or solid
renal masses. 13 mm cyst upper pole RIGHT kidney. Too small to
characterize hypodensity upper pole RIGHT kidney. The unopacified
ureters are normal in course and caliber. Delayed imaging through
the kidneys demonstrates symmetric prompt contrast excretion within
the proximal urinary collecting system. Urinary bladder is partially
distended and unremarkable. Normal adrenal glands.

STOMACH/BOWEL: Small hiatal hernia. The stomach, small and large
bowel are normal in course and caliber without inflammatory changes,
mild limited sensitivity without oral contrast. Moderate to severe
descending and sigmoid colonic diverticulosis colonic air-fluid
levels. Small amount of fluid in the small bowel. The appendix is
not discretely identified, however there are no inflammatory changes
in the right lower quadrant.

VASCULAR/LYMPHATIC: Aortoiliac vessels are normal in course and
caliber. Mild calcific atherosclerosis. No lymphadenopathy by CT
size criteria.

REPRODUCTIVE: Status post hysterectomy.

OTHER: No intraperitoneal free fluid or free air.

MUSCULOSKELETAL: Nonacute. Small to moderate fat containing
umbilical hernia. Narrow necked infraumbilical moderate to large
ventral hernias containing fat and fluid, marginal calcification.
Moderate sacroiliac osteoarthrosis. Multilevel severe degenerative
change of lumbar spine better characterized on prior MRI. Severe
canal stenosis L4-5.
IMPRESSION: 1. Small and large bowel air-fluid levels suggesting enteritis.
Colonic diverticulosis without diverticulitis.
2. Large ventral hernias containing fat and fluid.  No ascites.
3. **An incidental finding of potential clinical significance has
been found. 7 mm LEFT lower lobe pulmonary nodule. Recommend CT
chest at 6-12 months. This recommendation follows the consensus
statement: Guidelines for Management of Small Pulmonary Nodules
Detected on CT Scans: A Statement from the [HOSPITAL] as
Aortic Atherosclerosis (U5CVP-038.8).

## 2017-09-13 ENCOUNTER — Other Ambulatory Visit: Payer: Self-pay | Admitting: Family Medicine

## 2017-09-13 DIAGNOSIS — F419 Anxiety disorder, unspecified: Secondary | ICD-10-CM

## 2017-09-13 MED ORDER — PAROXETINE HCL 10 MG PO TABS: 10.0000 mg | ORAL_TABLET | Freq: Every day | ORAL | 3 refills | Status: DC

## 2017-09-13 NOTE — Telephone Encounter (Signed)
[facility] faxed a request for a 90-days supply for the following medication. Thanks CC ° °PARoxetine (PAXIL) 10 MG tablet  ° °

## 2017-09-14 DIAGNOSIS — J449 Chronic obstructive pulmonary disease, unspecified: Secondary | ICD-10-CM | POA: Diagnosis not present

## 2017-09-23 ENCOUNTER — Ambulatory Visit
Admission: RE | Admit: 2017-09-23 | Discharge: 2017-09-23 | Disposition: A | Discharge status: Home / Self Care | Payer: Medicare Other | Source: Ambulatory Visit | Attending: Internal Medicine | Admitting: Internal Medicine

## 2017-09-23 ENCOUNTER — Other Ambulatory Visit: Payer: Self-pay | Admitting: Internal Medicine

## 2017-09-23 DIAGNOSIS — J439 Emphysema, unspecified: Secondary | ICD-10-CM | POA: Diagnosis not present

## 2017-09-23 DIAGNOSIS — J301 Allergic rhinitis due to pollen: Secondary | ICD-10-CM | POA: Diagnosis not present

## 2017-09-23 DIAGNOSIS — R0602 Shortness of breath: Secondary | ICD-10-CM | POA: Diagnosis not present

## 2017-09-23 DIAGNOSIS — J449 Chronic obstructive pulmonary disease, unspecified: Secondary | ICD-10-CM

## 2017-09-23 DIAGNOSIS — J9611 Chronic respiratory failure with hypoxia: Secondary | ICD-10-CM | POA: Diagnosis not present

## 2017-10-01 ENCOUNTER — Other Ambulatory Visit: Payer: Self-pay | Admitting: Family Medicine

## 2017-10-01 DIAGNOSIS — Z1231 Encounter for screening mammogram for malignant neoplasm of breast: Secondary | ICD-10-CM

## 2017-10-14 DIAGNOSIS — J449 Chronic obstructive pulmonary disease, unspecified: Secondary | ICD-10-CM | POA: Diagnosis not present

## 2017-10-15 ENCOUNTER — Encounter: Payer: Medicare Other | Admitting: Family Medicine

## 2017-10-22 ENCOUNTER — Ambulatory Visit
Admission: RE | Admit: 2017-10-22 | Discharge: 2017-10-22 | Disposition: A | Discharge status: Home / Self Care | Payer: Medicare Other | Source: Ambulatory Visit | Attending: Family Medicine | Admitting: Family Medicine

## 2017-10-22 DIAGNOSIS — Z1231 Encounter for screening mammogram for malignant neoplasm of breast: Secondary | ICD-10-CM | POA: Diagnosis not present

## 2017-10-23 ENCOUNTER — Ambulatory Visit: Payer: Self-pay | Admitting: Family Medicine

## 2017-11-07 ENCOUNTER — Telehealth: Payer: Self-pay | Admitting: Family Medicine

## 2017-11-07 MED ORDER — AZITHROMYCIN 250 MG PO TABS: ORAL_TABLET | ORAL | 2 refills | Status: DC

## 2017-11-07 NOTE — Telephone Encounter (Signed)
Zpak with 2 rf due to her COPD. thx

## 2017-11-07 NOTE — Telephone Encounter (Signed)
Pt advised and RX sent in-[place] V Alayah Knouff, RMA  

## 2017-11-07 NOTE — Telephone Encounter (Signed)
Spoke with patient to make sure she does not have any other symptoms, she said no. Not coughing phlegm, no chest tightness or wheezing, no fever or body aches. Patient has taking Alkelsetzer. I advised patient I would check with dr Sullivan LoneGilbert if we can send in Zpak or what is the Jonelle Sportsadvise-Katrisha Segall V Quinnlan Abruzzo, RMA

## 2017-11-07 NOTE — Telephone Encounter (Signed)
Patient called in stating she needed a zpak and "you normally call this in" . She has  symptoms of sneezing, congested nose, no fever.   [facility] [place].

## 2017-11-11 ENCOUNTER — Other Ambulatory Visit: Payer: Self-pay

## 2017-11-11 MED ORDER — IPRATROPIUM-ALBUTEROL 0.5-2.5 (3) MG/3ML IN SOLN: 3.0000 mL | Freq: Four times a day (QID) | RESPIRATORY_TRACT | 1 refills | Status: DC | PRN

## 2017-11-14 DIAGNOSIS — J449 Chronic obstructive pulmonary disease, unspecified: Secondary | ICD-10-CM | POA: Diagnosis not present

## 2017-12-04 ENCOUNTER — Ambulatory Visit (INDEPENDENT_AMBULATORY_CARE_PROVIDER_SITE_OTHER): Payer: Medicare Other | Admitting: Family Medicine

## 2017-12-04 ENCOUNTER — Ambulatory Visit (INDEPENDENT_AMBULATORY_CARE_PROVIDER_SITE_OTHER): Payer: Medicare Other

## 2017-12-04 VITALS — BP 136/74 | HR 88 | Temp 98.2°F | Resp 18 | Wt 222.2 lb

## 2017-12-04 VITALS — BP 136/74 | HR 88 | Temp 98.2°F | Ht 67.0 in | Wt 222.2 lb

## 2017-12-04 DIAGNOSIS — G629 Polyneuropathy, unspecified: Secondary | ICD-10-CM

## 2017-12-04 DIAGNOSIS — Z0000 Encounter for general adult medical examination without abnormal findings: Secondary | ICD-10-CM | POA: Diagnosis not present

## 2017-12-04 DIAGNOSIS — R739 Hyperglycemia, unspecified: Secondary | ICD-10-CM | POA: Diagnosis not present

## 2017-12-04 DIAGNOSIS — E7849 Other hyperlipidemia: Secondary | ICD-10-CM

## 2017-12-04 DIAGNOSIS — J449 Chronic obstructive pulmonary disease, unspecified: Secondary | ICD-10-CM | POA: Diagnosis not present

## 2017-12-04 DIAGNOSIS — G8929 Other chronic pain: Secondary | ICD-10-CM | POA: Diagnosis not present

## 2017-12-04 DIAGNOSIS — M545 Low back pain: Secondary | ICD-10-CM | POA: Diagnosis not present

## 2017-12-04 MED ORDER — GABAPENTIN 300 MG PO CAPS: ORAL_CAPSULE | ORAL | 3 refills | Status: DC

## 2017-12-04 NOTE — Progress Notes (Addendum)
Patient: Kristen Schroeder, Female    DOB: Aug 16, 1941, 77 y.o.   MRN: [id number] Visit Date: 12/04/2017  Today's Provider: Megan Mans, MD   Chief Complaint  Patient presents with  . Annual Exam   Subjective:  Kristen Schroeder is a 77 y.o. female who presents today for health maintenance and complete physical. She feels fairly well. She reports exercising not at this time. She reports she is sleeping fairly well.  Last mammogram 10/22/2017-negative BMD 03/02/2008-normal Colonoscopy 04/06/2008- Dr Servando Snare,. Entire colon was normal, rpt 2019 Pap smear 06/03/2013-negative  Review of Systems  Constitutional: Positive for fatigue.  HENT: Negative.   Eyes: Negative.   Respiratory: Positive for shortness of breath.   Cardiovascular: Negative.   Gastrointestinal: Negative.   Endocrine: Negative.   Genitourinary: Negative.   Musculoskeletal: Positive for arthralgias, back pain and gait problem.  Skin: Negative.   Allergic/Immunologic: Negative.   Hematological: Negative.   Psychiatric/Behavioral: Negative.     Social History   Socioeconomic History  . Marital status: Married    Spouse name: Not on file  . Number of children: 4  . Years of education: Not on file  . Highest education level: 8th grade  Social Needs  . Financial resource strain: Not hard at all  . Food insecurity - worry: Never true  . Food insecurity - inability: Never true  . Transportation needs - medical: No  . Transportation needs - non-medical: No  Occupational History  . Occupation: retired  Tobacco Use  . Smoking status: Former Smoker    Packs/day: 0.50    Years: 15.00    Pack years: 7.50  . Smokeless tobacco: Never Used  . Tobacco comment: 30-40 years ago  Substance and Sexual Activity  . Alcohol use: No    Alcohol/week: 0.0 oz  . Drug use: No  . Sexual activity: No  Other Topics Concern  . Not on file  Social History Narrative  . Not on file    Patient Active Problem List   Diagnosis Date  Noted  . Aneurysm (HCC) 07/10/2016  . Nonspecific abnormal finding 05/11/2015  . Allergic rhinitis 05/11/2015  . Anxiety 05/11/2015  . Bronchitis, chronic (HCC) 05/11/2015  . CCF (congestive cardiac failure) (HCC) 05/11/2015  . Clinical depression 05/11/2015  . 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 Essential (primary) hypertension 05/11/2015  . Acid reflux 05/11/2015  . HLD (hyperlipidemia) 05/11/2015  . Adult hypothyroidism 05/11/2015  . Cervical dysplasia, mild 05/11/2015  . Neuropathy 05/11/2015  . Adiposity 05/11/2015  . Obstructive apnea 05/11/2015  . Arthritis, degenerative 05/11/2015    Past Surgical History:  Procedure Laterality Date  . ABDOMINAL HYSTERECTOMY    . CATARACT EXTRACTION W/PHACO Left 07/03/2016   Procedure: CATARACT EXTRACTION PHACO AND INTRAOCULAR LENS PLACEMENT (IOC);  Surgeon: Galen Manila, MD;  Location: [facility] ORS;  Service: Ophthalmology;  Laterality: Left;  Lot: [id number] H [place]: 00:40.1 AP%: 17.4 CDE:6.94  . TUBAL LIGATION      Her family history includes Drug abuse in her other; Heart disease in her mother; Hypertension in her other.     Outpatient Encounter Medications as of 12/04/2017  Medication Sig Note  . aspirin EC 81 MG tablet Take 81 mg by mouth daily.   Marland Kitchen DALIRESP 500 MCG TABS tablet Take 500 mcg by mouth daily.    . DULERA 100-5 MCG/ACT AERO Inhale 2 puffs into the lungs 2 (two) times daily.    . fluticasone (FLONASE) 50 MCG/ACT nasal spray Place 1 spray into both  nostrils daily as needed.    . furosemide (LASIX) 20 MG tablet Take 2 tablets (40 mg total) by mouth daily.   Marland Kitchen. gabapentin (NEURONTIN) 300 MG capsule TAKE 2 CAPSULES BY MOUTH 2 TIME A DAY   . HYDROcodone-acetaminophen (NORCO) 10-325 MG tablet Take 1-2 tablets by mouth every 4 (four) hours as needed.   Marland Kitchen. ipratropium-albuterol (DUONEB) 0.5-2.5 (3) MG/3ML SOLN Take 3 mLs by nebulization every 6 (six) hours as needed.   . isosorbide mononitrate (IMDUR) 30 MG 24  hr tablet TAKE 1 TABLET (30 MG TOTAL) BY MOUTH ONCE DAILY. 04/09/2017: Pt hasn't been takig qd, just prn  . levothyroxine (SYNTHROID, LEVOTHROID) 125 MCG tablet TAKE 1 TABLET BY MOUTH DAILY   . lisinopril (PRINIVIL,ZESTRIL) 20 MG tablet Take 1 tablet (20 mg total) by mouth daily.   Marland Kitchen. loratadine (CLARITIN) 10 MG tablet TAKE 1 TABLET (10 MG TOTAL) BY MOUTH DAILY.   Marland Kitchen. LORazepam (ATIVAN) 0.5 MG tablet Take 1 tablet (0.5 mg total) by mouth 2 (two) times daily as needed for anxiety.   . metFORMIN (GLUCOPHAGE) 1000 MG tablet TAKE 1 TABLET BY MOUTH TWICE A DAY (Patient taking differently: TAKE 1 TABLET BY MOUTH ONCE A DAY)   . MULTIPLE VITAMIN PO Take 1 tablet by mouth daily.    Marland Kitchen. omeprazole (PRILOSEC) 40 MG capsule Take 1 capsule (40 mg total) by mouth daily.   . ondansetron (ZOFRAN-ODT) 4 MG disintegrating tablet TAKE 1 TABLET BY MOUTH EVERY 8 HOURS AS NEEDED FOR NAUSEA AND VOMITING   . ONE TOUCH ULTRA TEST test strip USE 1 STRIP 2 TIMES DAILY AND AS NEEDED   . oxyCODONE-acetaminophen (ROXICET) 5-325 MG tablet Take 1 tablet by mouth every 4 (four) hours as needed for severe pain.   Marland Kitchen. PARoxetine (PAXIL) 10 MG tablet Take 1 tablet (10 mg total) at bedtime by mouth.   . pravastatin (PRAVACHOL) 10 MG tablet TAKE 1 TABLET (10 MG TOTAL) BY MOUTH DAILY.   Marland Kitchen. SPIRIVA HANDIHALER 18 MCG inhalation capsule Place 18 mcg into inhaler and inhale daily.     No facility-administered encounter medications on file as of 12/04/2017.     Patient Care Team: Maple HudsonGilbert, Richard L Jr., MD as PCP - General (Family Medicine) Galen ManilaPorfilio, William, MD as Referring Physician (Ophthalmology) Alwyn Peaallwood, Dwayne D, MD as Consulting Physician (Cardiology) Louisa SecondKahn, Richard M, MD as Consulting Physician (Pulmonary Disease)      Objective:   Vitals:  Vitals:   12/04/17 1040  BP: 136/74  Pulse: 88  Resp: 18  Temp: 98.2 F (36.8 C)  Weight: 222 lb 3.2 oz (100.8 kg)    Physical Exam  Constitutional: She is oriented to person, place,  and time. She appears well-developed and well-nourished.  Obese BF NAD  HENT:  Head: Normocephalic and atraumatic.  Right Ear: External ear normal.  Left Ear: External ear normal.  Nose: Nose normal.  Eyes: No scleral icterus.  Neck: No thyromegaly present.  Cardiovascular: Normal rate, regular rhythm and normal heart sounds.  Pulmonary/Chest: Effort normal and breath sounds normal.  Abdominal: Soft.  Neurological: She is alert and oriented to person, place, and time.  Skin: Skin is warm and dry.  Psychiatric: She has a normal mood and affect. Her behavior is normal. Thought content normal.   6CIT Screen 12/04/2017 10/17/2016  What Year? 0 points 0 points  What month? 0 points 0 points  What time? 0 points 0 points  Count back from 20 0 points 4 points  Months in reverse 0  points 0 points  Repeat phrase 0 points 2 points  Total Score 0 6   Depression Screen PHQ 2/9 Scores 12/04/2017 07/11/2017 10/17/2016 07/10/2016  PHQ - 2 Score 0 0 0 1  PHQ- 9 Score - 3 - -   Assessment & Plan:    1. Annual physical exam - CBC with Differential/Platelet - Comprehensive metabolic panel - TSH - Lipid Panel With LDL/HDL Ratio  2. Hyperglycemia - CBC with Differential/Platelet  3. Chronic obstructive pulmonary disease, unspecified COPD type (HCC) 4. Chronic low back pain, unspecified back pain laterality, with sciatica presence unspecified  5. Neuropathy Increase Gabapentin, add 1 tablet in afternoon and continue taking 2 tablets in the morning and evening. - gabapentin (NEURONTIN) 300 MG capsule; 2 tablets in the morning, 1 tablet in the afternoon and 2 tablets in the evening  Dispense: 450 capsule; Refill: 3 - B12 - CBC with Differential/Platelet - TSH  6. Other hyperlipidemia - Comprehensive metabolic panel - Lipid Panel With LDL/HDL Ratio  HPI, Exam and A&P transcribed by Domingo Cocking, RMA under direction and in the presence of Julieanne Manson, MD. I have done the exam and  reviewed the chart and it is accurate to the best of my knowledge. Dentist has been used and  any errors in dictation or transcription are unintentional. Julieanne Manson M.D. [facility]

## 2017-12-04 NOTE — Patient Instructions (Addendum)
[name] , Thank you for taking time to come for your Medicare Wellness Visit. I appreciate your ongoing commitment to your health goals. Please review the following plan we discussed and let me know if I can assist you in the future.   Screening recommendations/referrals: Colonoscopy: Up to date Mammogram: Up to date Bone Density: Up to date Recommended yearly ophthalmology/optometry visit for glaucoma screening and checkup Recommended yearly dental visit for hygiene and checkup  Vaccinations: Influenza vaccine: Up to date Pneumococcal vaccine: Up to date Tdap vaccine: Up to date Shingles vaccine: Pt declines today. Zostavax completed 07/2012.  Advanced directives: Please bring a copy of your POA (Power of Attorney) and/or Living Will to your next appointment.   Conditions/risks identified: Obesity- recommend to continue with current diet regimen of cutting portion sizes in half and eating 3 healthy meals a day with 2 healthy snacks in between.   Next appointment: 10:30 AM today   Preventive Care 65 Years and Older, Female Preventive care refers to lifestyle choices and visits with your health care provider that can promote health and wellness. What does preventive care include?  A yearly physical exam. This is also called an annual well check.  Dental exams once or twice a year.  Routine eye exams. Ask your health care provider how often you should have your eyes checked.  Personal lifestyle choices, including:  Daily care of your teeth and gums.  Regular physical activity.  Eating a healthy diet.  Avoiding tobacco and drug use.  Limiting alcohol use.  Practicing safe sex.  Taking low-dose aspirin every day.  Taking vitamin and mineral supplements as recommended by your health care provider. What happens during an annual well check? The services and screenings done by your health care provider during your annual well check will depend on your age, overall health,  lifestyle risk factors, and family history of disease. Counseling  Your health care provider may ask you questions about your:  Alcohol use.  Tobacco use.  Drug use.  Emotional well-being.  Home and relationship well-being.  Sexual activity.  Eating habits.  History of falls.  Memory and ability to understand (cognition).  Work and work Astronomerenvironment.  Reproductive health. Screening  You may have the following tests or measurements:  Height, weight, and BMI.  Blood pressure.  Lipid and cholesterol levels. These may be checked every 5 years, or more frequently if you are over 269 years old.  Skin check.  Lung cancer screening. You may have this screening every year starting at age 77 if you have a 30-pack-year history of smoking and currently smoke or have quit within the past 15 years.  Fecal occult blood test (FOBT) of the stool. You may have this test every year starting at age 77.  Flexible sigmoidoscopy or colonoscopy. You may have a sigmoidoscopy every 5 years or a colonoscopy every 10 years starting at age 77.  Hepatitis C blood test.  Hepatitis B blood test.  Sexually transmitted disease (STD) testing.  Diabetes screening. This is done by checking your blood sugar (glucose) after you have not eaten for a while (fasting). You may have this done every 1-3 years.  Bone density scan. This is done to screen for osteoporosis. You may have this done starting at age 77.  Mammogram. This may be done every 1-2 years. Talk to your health care provider about how often you should have regular mammograms. Talk with your health care provider about your test results, treatment options, and if  necessary, the need for more tests. Vaccines  Your health care provider may recommend certain vaccines, such as:  Influenza vaccine. This is recommended every year.  Tetanus, diphtheria, and acellular pertussis (Tdap, Td) vaccine. You may need a Td booster every 10 years.  Zoster  vaccine. You may need this after age 24.  Pneumococcal 13-valent conjugate (PCV13) vaccine. One dose is recommended after age 27.  Pneumococcal polysaccharide (PPSV23) vaccine. One dose is recommended after age 23. Talk to your health care provider about which screenings and vaccines you need and how often you need them. This information is not intended to replace advice given to you by your health care provider. Make sure you discuss any questions you have with your health care provider. Document Released: 11/18/2015 Document Revised: 07/11/2016 Document Reviewed: 08/23/2015 Elsevier Interactive Patient Education  2017 [place] Prevention in the Home Falls can cause injuries. They can happen to people of all ages. There are many things you can do to make your home safe and to help prevent falls. What can I do on the outside of my home?  Regularly fix the edges of walkways and driveways and fix any cracks.  Remove anything that might make you trip as you walk through a door, such as a raised step or threshold.  Trim any bushes or trees on the path to your home.  Use bright outdoor lighting.  Clear any walking paths of anything that might make someone trip, such as rocks or tools.  Regularly check to see if handrails are loose or broken. Make sure that both sides of any steps have handrails.  Any raised decks and porches should have guardrails on the edges.  Have any leaves, snow, or ice cleared regularly.  Use sand or salt on walking paths during winter.  Clean up any spills in your garage right away. This includes oil or grease spills. What can I do in the bathroom?  Use night lights.  Install grab bars by the toilet and in the tub and shower. Do not use towel bars as grab bars.  Use non-skid mats or decals in the tub or shower.  If you need to sit down in the shower, use a plastic, non-slip stool.  Keep the floor dry. Clean up any water that spills on the  floor as soon as it happens.  Remove soap buildup in the tub or shower regularly.  Attach bath mats securely with double-sided non-slip rug tape.  Do not have throw rugs and other things on the floor that can make you trip. What can I do in the bedroom?  Use night lights.  Make sure that you have a light by your bed that is easy to reach.  Do not use any sheets or blankets that are too big for your bed. They should not hang down onto the floor.  Have a firm chair that has side arms. You can use this for support while you get dressed.  Do not have throw rugs and other things on the floor that can make you trip. What can I do in the kitchen?  Clean up any spills right away.  Avoid walking on wet floors.  Keep items that you use a lot in easy-to-reach places.  If you need to reach something above you, use a strong step stool that has a grab bar.  Keep electrical cords out of the way.  Do not use floor polish or wax that makes floors slippery. If you must  use wax, use non-skid floor wax.  Do not have throw rugs and other things on the floor that can make you trip. What can I do with my stairs?  Do not leave any items on the stairs.  Make sure that there are handrails on both sides of the stairs and use them. Fix handrails that are broken or loose. Make sure that handrails are as long as the stairways.  Check any carpeting to make sure that it is firmly attached to the stairs. Fix any carpet that is loose or worn.  Avoid having throw rugs at the top or bottom of the stairs. If you do have throw rugs, attach them to the floor with carpet tape.  Make sure that you have a light switch at the top of the stairs and the bottom of the stairs. If you do not have them, ask someone to add them for you. What else can I do to help prevent falls?  Wear shoes that:  Do not have high heels.  Have rubber bottoms.  Are comfortable and fit you well.  Are closed at the toe. Do not wear  sandals.  If you use a stepladder:  Make sure that it is fully opened. Do not climb a closed stepladder.  Make sure that both sides of the stepladder are locked into place.  Ask someone to hold it for you, if possible.  Clearly mark and make sure that you can see:  Any grab bars or handrails.  First and last steps.  Where the edge of each step is.  Use tools that help you move around (mobility aids) if they are needed. These include:  Canes.  Walkers.  Scooters.  Crutches.  Turn on the lights when you go into a dark area. Replace any light bulbs as soon as they burn out.  Set up your furniture so you have a clear path. Avoid moving your furniture around.  If any of your floors are uneven, fix them.  If there are any pets around you, be aware of where they are.  Review your medicines with your doctor. Some medicines can make you feel dizzy. This can increase your chance of falling. Ask your doctor what other things that you can do to help prevent falls. This information is not intended to replace advice given to you by your health care provider. Make sure you discuss any questions you have with your health care provider. Document Released: 08/18/2009 Document Revised: 03/29/2016 Document Reviewed: 11/26/2014 Elsevier Interactive Patient Education  2017 [employer].

## 2017-12-04 NOTE — Progress Notes (Signed)
Subjective:   Kristen Schroeder is a 77 y.o. female who presents for Medicare Annual (Subsequent) preventive examination.  Review of Systems:  N/A  Cardiac Risk Factors include: advanced age (>11men, >37 women);diabetes mellitus;dyslipidemia;hypertension;obesity (BMI >30kg/m2)     Objective:     Vitals: BP 136/74 (BP Location: Left Arm)   Pulse 88   Temp 98.2 F (36.8 C) (Oral)   Ht 5\' 7"  (1.702 m)   Wt 222 lb 3.2 oz (100.8 kg)   BMI 34.80 kg/m   Body mass index is 34.8 kg/m.  Advanced Directives 12/04/2017 06/13/2017 06/06/2017 05/29/2017 04/09/2017 10/17/2016 07/05/2016  Does Patient Have a Medical Advance Directive? Yes No;Yes Yes Yes Yes Yes No  Type of Advance Directive Living will Living will Living will Living will Living will Healthcare Power of Attorney;Living will -  Would patient like information on creating a medical advance directive? - - - - - - Yes - Educational materials given    Tobacco Social History   Tobacco Use  Smoking Status Former Smoker  . Packs/day: 0.50  . Years: 15.00  . Pack years: 7.50  Smokeless Tobacco Never Used  Tobacco Comment   30-40 years ago     Counseling given: Not Answered Comment: 30-40 years ago   Clinical Intake:  Pre-visit preparation completed: Yes  Pain : No/denies pain Pain Score: 0-No pain     Nutritional Status: BMI > 30  Obese Nutritional Risks: None Diabetes: Yes(type 2) CBG done?: No Did pt. bring in CBG monitor from home?: No  How often do you need to have someone help you when you read instructions, pamphlets, or other written materials from your doctor or pharmacy?: 1 - Never  Interpreter Needed?: No  Information entered by :: [facility], LPN  Past Medical History:  Diagnosis Date  . Arthritis   . COPD (chronic obstructive pulmonary disease) (HCC)   . Diabetes mellitus without complication (HCC)   . Dysrhythmia   . Heart murmur   . History of orthopnea   . Hypertension   . Hypothyroidism   .  Neuropathy   . Oxygen dependent    3L  CONTINUOUS  . Pain CHRONIC BACK PAIN  . Shortness of breath dyspnea   . Wheezing    Past Surgical History:  Procedure Laterality Date  . ABDOMINAL HYSTERECTOMY    . CATARACT EXTRACTION W/PHACO Left 07/03/2016   Procedure: CATARACT EXTRACTION PHACO AND INTRAOCULAR LENS PLACEMENT (IOC);  Surgeon: Galen Manila, MD;  Location: [facility] ORS;  Service: Ophthalmology;  Laterality: Left;  Lot: [id number] H [place]: 00:40.1 AP%: 17.4 CDE:6.94  . TUBAL LIGATION     Family History  Problem Relation Age of Onset  . Heart disease Mother   . Drug abuse Other   . Hypertension Other    Social History   Socioeconomic History  . Marital status: Married    Spouse name: None  . Number of children: 4  . Years of education: None  . Highest education level: 8th grade  Social Needs  . Financial resource strain: Not hard at all  . Food insecurity - worry: Never true  . Food insecurity - inability: Never true  . Transportation needs - medical: No  . Transportation needs - non-medical: No  Occupational History  . Occupation: retired  Tobacco Use  . Smoking status: Former Smoker    Packs/day: 0.50    Years: 15.00    Pack years: 7.50  . Smokeless tobacco: Never Used  . Tobacco comment: 30-40 years ago  Substance and Sexual Activity  . Alcohol use: No    Alcohol/week: 0.0 oz  . Drug use: No  . Sexual activity: No  Other Topics Concern  . None  Social History Narrative  . None    Outpatient Encounter Medications as of 12/04/2017  Medication Sig  . aspirin EC 81 MG tablet Take 81 mg by mouth daily.  Marland Kitchen DALIRESP 500 MCG TABS tablet Take 500 mcg by mouth daily.   . DULERA 100-5 MCG/ACT AERO Inhale 2 puffs into the lungs 2 (two) times daily.   . fluticasone (FLONASE) 50 MCG/ACT nasal spray Place 1 spray into both nostrils daily as needed.   . furosemide (LASIX) 20 MG tablet Take 2 tablets (40 mg total) by mouth daily.  Marland Kitchen gabapentin (NEURONTIN) 300 MG capsule  TAKE 2 CAPSULES BY MOUTH 2 TIME A DAY  . HYDROcodone-acetaminophen (NORCO) 10-325 MG tablet Take 1-2 tablets by mouth every 4 (four) hours as needed.  Marland Kitchen ipratropium-albuterol (DUONEB) 0.5-2.5 (3) MG/3ML SOLN Take 3 mLs by nebulization every 6 (six) hours as needed.  . isosorbide mononitrate (IMDUR) 30 MG 24 hr tablet TAKE 1 TABLET (30 MG TOTAL) BY MOUTH ONCE DAILY.  Marland Kitchen levothyroxine (SYNTHROID, LEVOTHROID) 125 MCG tablet TAKE 1 TABLET BY MOUTH DAILY  . lisinopril (PRINIVIL,ZESTRIL) 20 MG tablet Take 1 tablet (20 mg total) by mouth daily.  Marland Kitchen loratadine (CLARITIN) 10 MG tablet TAKE 1 TABLET (10 MG TOTAL) BY MOUTH DAILY.  Marland Kitchen LORazepam (ATIVAN) 0.5 MG tablet Take 1 tablet (0.5 mg total) by mouth 2 (two) times daily as needed for anxiety.  . metFORMIN (GLUCOPHAGE) 1000 MG tablet TAKE 1 TABLET BY MOUTH TWICE A DAY (Patient taking differently: TAKE 1 TABLET BY MOUTH ONCE A DAY)  . MULTIPLE VITAMIN PO Take 1 tablet by mouth daily.   Marland Kitchen omeprazole (PRILOSEC) 40 MG capsule Take 1 capsule (40 mg total) by mouth daily.  . ondansetron (ZOFRAN-ODT) 4 MG disintegrating tablet TAKE 1 TABLET BY MOUTH EVERY 8 HOURS AS NEEDED FOR NAUSEA AND VOMITING  . ONE TOUCH ULTRA TEST test strip USE 1 STRIP 2 TIMES DAILY AND AS NEEDED  . oxyCODONE-acetaminophen (ROXICET) 5-325 MG tablet Take 1 tablet by mouth every 4 (four) hours as needed for severe pain.  Marland Kitchen PARoxetine (PAXIL) 10 MG tablet Take 1 tablet (10 mg total) at bedtime by mouth.  . pravastatin (PRAVACHOL) 10 MG tablet TAKE 1 TABLET (10 MG TOTAL) BY MOUTH DAILY.  Marland Kitchen SPIRIVA HANDIHALER 18 MCG inhalation capsule Place 18 mcg into inhaler and inhale daily.   . [DISCONTINUED] azithromycin (ZITHROMAX) 250 MG tablet As directed   No facility-administered encounter medications on file as of 12/04/2017.     Activities of Daily Living In your present state of health, do you have any difficulty performing the following activities: 12/04/2017  Hearing? N  Vision? N  Difficulty  concentrating or making decisions? N  Walking or climbing stairs? Y  Comment Due to SOB and back pain.  Dressing or bathing? N  Doing errands, shopping? N  Preparing Food and eating ? N  Using the Toilet? N  In the past six months, have you accidently leaked urine? N  Do you have problems with loss of bowel control? N  Managing your Medications? N  Managing your Finances? N  Housekeeping or managing your Housekeeping? Y  Comment Referral sent to care guide for assistance with cleaning.  Some recent data might be hidden    Patient Care Team: Maple Hudson., MD  as PCP - General (Family Medicine) Galen Manila, MD as Referring Physician (Ophthalmology) Alwyn Pea, MD as Consulting Physician (Cardiology) Louisa Second, MD as Consulting Physician (Pulmonary Disease)    Assessment:   This is a routine wellness examination for Deniss.  Exercise Activities and Dietary recommendations Current Exercise Habits: Home exercise routine, Type of exercise: stretching;Other - see comments(rides the stationary bike occasionally), Time (Minutes): 10(5-10 mins ), Frequency (Times/Week): 3(to 4 days a week), Weekly Exercise (Minutes/Week): 30, Intensity: Mild  Goals    . DIET - REDUCE PORTION SIZE     Recommend to continue with current diet regimen of cutting portion sizes in half and eating 3 healthy meals a day with 2 healthy snacks in between.        Fall Risk Fall Risk  12/04/2017 10/17/2016 07/10/2016 06/15/2015  Falls in the past year? No No No No   Is the patient's home free of loose throw rugs in walkways, pet beds, electrical cords, etc?   yes      Grab bars in the bathroom? yes      Handrails on the stairs? N/a     Adequate lighting?   yes  Timed Get Up and Go performed: N/A  Depression Screen PHQ 2/9 Scores 12/04/2017 07/11/2017 10/17/2016 07/10/2016  PHQ - 2 Score 0 0 0 1  PHQ- 9 Score - 3 - -     Cognitive Function     6CIT Screen 12/04/2017 10/17/2016    What Year? 0 points 0 points  What month? 0 points 0 points  What time? 0 points 0 points  Count back from 20 0 points 4 points  Months in reverse 0 points 0 points  Repeat phrase 0 points 2 points  Total Score 0 6    Immunization History  Administered Date(s) Administered  . Influenza Split 07/23/2012  . Influenza, High Dose Seasonal PF 08/24/2014, 08/18/2015, 08/28/2016, 07/11/2017  . Influenza,inj,Quad PF,6+ Mos 07/29/2013  . Pneumococcal Conjugate-13 08/24/2014  . Pneumococcal Polysaccharide-23 11/03/1999, 07/23/2012  . Tdap 07/23/2012  . Zoster 07/23/2012    Qualifies for Shingles Vaccine? Due for Shingles vaccine. Declined my offer to administer today. Education has been provided regarding the importance of this vaccine. Pt has been advised to call her insurance company to determine her out of pocket expense. Advised she may also receive this vaccine at her local pharmacy or Health Dept. Verbalized acceptance and understanding.  Screening Tests Health Maintenance  Topic Date Due  . HEMOGLOBIN A1C  02/24/2018  . OPHTHALMOLOGY EXAM  03/22/2018  . FOOT EXAM  08/26/2018  . TETANUS/TDAP  07/23/2022  . INFLUENZA VACCINE  Completed  . DEXA SCAN  Completed  . PNA vac Low Risk Adult  Completed    Cancer Screenings: Lung: Low Dose CT Chest recommended if Age 13-80 years, 30 pack-year currently smoking OR have quit w/in 15years. Patient does not qualify. Breast:  Up to date on Mammogram? Yes   Up to date of Bone Density/Dexa? Yes Colorectal: Up to date  Additional Screenings:  Hepatitis B/HIV/Syphillis: Pt declines today.  Hepatitis C Screening: Pt declines today.      Plan:  I have personally reviewed and addressed the Medicare Annual Wellness questionnaire and have noted the following in the patient's chart:  A. Medical and social history B. Use of alcohol, tobacco or illicit drugs  C. Current medications and supplements D. Functional ability and status E.   Nutritional status F.  Physical activity G. Advance directives H. List of  other physicians I.  Hospitalizations, surgeries, and ER visits in previous 12 months J.  Vitals K. Screenings such as hearing and vision if needed, cognitive and depression L. Referrals and appointments - none  In addition, I have reviewed and discussed with patient certain preventive protocols, quality metrics, and best practice recommendations. A written personalized care plan for preventive services as well as general preventive health recommendations were provided to patient.  See attached scanned questionnaire for additional information.   Signed,  Hyacinth MeekerMckenzie Jaelie Aguilera, LPN Nurse Health Advisor   Nurse Recommendations: None.

## 2017-12-05 ENCOUNTER — Encounter: Payer: Medicare Other | Admitting: Family Medicine

## 2017-12-05 ENCOUNTER — Telehealth: Payer: Self-pay

## 2017-12-05 LAB — TSH: TSH: 1.63 u[IU]/mL (ref 0.450–4.500)

## 2017-12-05 LAB — CBC WITH DIFFERENTIAL/PLATELET
Basophils Absolute: 0 10*3/uL (ref 0.0–0.2)
Basos: 0 %
EOS (ABSOLUTE): 0.3 10*3/uL (ref 0.0–0.4)
Eos: 4 %
Hematocrit: 34.3 % (ref 34.0–46.6)
Hemoglobin: 10.3 g/dL — ABNORMAL LOW (ref 11.1–15.9)
Immature Grans (Abs): 0 10*3/uL (ref 0.0–0.1)
Immature Granulocytes: 0 %
Lymphocytes Absolute: 1.5 10*3/uL (ref 0.7–3.1)
Lymphs: 20 %
MCH: 27 pg (ref 26.6–33.0)
MCHC: 30 g/dL — ABNORMAL LOW (ref 31.5–35.7)
MCV: 90 fL (ref 79–97)
Monocytes Absolute: 0.6 10*3/uL (ref 0.1–0.9)
Monocytes: 8 %
Neutrophils Absolute: 4.9 10*3/uL (ref 1.4–7.0)
Neutrophils: 68 %
Platelets: 255 10*3/uL (ref 150–379)
RBC: 3.81 x10E6/uL (ref 3.77–5.28)
RDW: 15.3 % (ref 12.3–15.4)
WBC: 7.3 10*3/uL (ref 3.4–10.8)

## 2017-12-05 LAB — COMPREHENSIVE METABOLIC PANEL
ALT: 6 IU/L (ref 0–32)
AST: 16 IU/L (ref 0–40)
Albumin/Globulin Ratio: 1.5 (ref 1.2–2.2)
Albumin: 4 g/dL (ref 3.5–4.8)
Alkaline Phosphatase: 80 IU/L (ref 39–117)
BUN/Creatinine Ratio: 18 (ref 12–28)
BUN: 11 mg/dL (ref 8–27)
Bilirubin Total: <0.2 mg/dL (ref 0.0–1.2)
CO2: 28 mmol/L (ref 20–29)
Calcium: 9.3 mg/dL (ref 8.7–10.3)
Chloride: 100 mmol/L (ref 96–106)
Creatinine, Ser: 0.6 mg/dL (ref 0.57–1.00)
GFR calc Af Amer: 102 mL/min/{1.73_m2} (ref >59)
GFR calc non Af Amer: 89 mL/min/{1.73_m2} (ref >59)
Globulin, Total: 2.6 g/dL (ref 1.5–4.5)
Glucose: 84 mg/dL (ref 65–99)
Potassium: 4.4 mmol/L (ref 3.5–5.2)
Sodium: 144 mmol/L (ref 134–144)
Total Protein: 6.6 g/dL (ref 6.0–8.5)

## 2017-12-05 LAB — LIPID PANEL WITH LDL/HDL RATIO
Cholesterol, Total: 185 mg/dL (ref 100–199)
HDL: 65 mg/dL (ref >39)
LDL Calculated: 94 mg/dL (ref 0–99)
LDl/HDL Ratio: 1.4 ratio (ref 0.0–3.2)
Triglycerides: 131 mg/dL (ref 0–149)
VLDL Cholesterol Cal: 26 mg/dL (ref 5–40)

## 2017-12-05 LAB — VITAMIN B12: Vitamin B-12: 387 pg/mL (ref 232–1245)

## 2017-12-05 NOTE — Telephone Encounter (Signed)
-----   Message from Maple Hudsonichard L Gilbert Jr., MD sent at 12/05/2017  1:29 PM EST ----- Labs better.

## 2017-12-05 NOTE — Telephone Encounter (Signed)
[facility]  [facility]   ----- Message from Maple Hudsonichard L Gilbert Jr., MD sent at 12/05/2017  1:29 PM EST ----- Labs better.

## 2017-12-06 NOTE — Telephone Encounter (Signed)
Pt informed and voiced understanding of results. 

## 2017-12-15 DIAGNOSIS — J449 Chronic obstructive pulmonary disease, unspecified: Secondary | ICD-10-CM | POA: Diagnosis not present

## 2017-12-23 ENCOUNTER — Telehealth: Payer: Self-pay

## 2017-12-23 NOTE — Telephone Encounter (Signed)
Patient is requesting a call back.

## 2017-12-24 NOTE — Telephone Encounter (Signed)
LMTCB on direct line.  -MM

## 2017-12-26 NOTE — Telephone Encounter (Signed)
Spoke with pt who states she was checking in to see the status on her community referral. Referral sent after last AWV on 12/04/17, for assistance around the house (cooking, cleaning, etc.). Forwarding to Amy.. Can you please look into this? Thanks! - MM

## 2017-12-27 NOTE — Telephone Encounter (Signed)
I have to gotten to her but I will today! Thanks! Amy

## 2017-12-30 ENCOUNTER — Encounter: Payer: Self-pay | Admitting: Family Medicine

## 2018-01-05 ENCOUNTER — Other Ambulatory Visit: Payer: Self-pay | Admitting: Internal Medicine

## 2018-01-12 DIAGNOSIS — J449 Chronic obstructive pulmonary disease, unspecified: Secondary | ICD-10-CM | POA: Diagnosis not present

## 2018-01-27 ENCOUNTER — Other Ambulatory Visit: Payer: Self-pay | Admitting: Family Medicine

## 2018-01-27 DIAGNOSIS — F419 Anxiety disorder, unspecified: Secondary | ICD-10-CM

## 2018-01-28 ENCOUNTER — Ambulatory Visit: Payer: Medicare Other | Admitting: Internal Medicine

## 2018-01-28 ENCOUNTER — Encounter: Payer: Self-pay | Admitting: Internal Medicine

## 2018-01-28 VITALS — BP 140/66 | HR 95 | Resp 16 | Ht 67.0 in | Wt 232.0 lb

## 2018-01-28 DIAGNOSIS — I10 Essential (primary) hypertension: Secondary | ICD-10-CM | POA: Diagnosis not present

## 2018-01-28 DIAGNOSIS — R0902 Hypoxemia: Secondary | ICD-10-CM | POA: Diagnosis not present

## 2018-01-28 DIAGNOSIS — I509 Heart failure, unspecified: Secondary | ICD-10-CM

## 2018-01-28 DIAGNOSIS — R0602 Shortness of breath: Secondary | ICD-10-CM | POA: Diagnosis not present

## 2018-01-28 DIAGNOSIS — J9611 Chronic respiratory failure with hypoxia: Secondary | ICD-10-CM

## 2018-01-28 DIAGNOSIS — J449 Chronic obstructive pulmonary disease, unspecified: Secondary | ICD-10-CM | POA: Diagnosis not present

## 2018-01-28 DIAGNOSIS — J8410 Pulmonary fibrosis, unspecified: Secondary | ICD-10-CM | POA: Diagnosis not present

## 2018-01-28 DIAGNOSIS — E7849 Other hyperlipidemia: Secondary | ICD-10-CM | POA: Diagnosis not present

## 2018-01-28 NOTE — Patient Instructions (Signed)

## 2018-01-28 NOTE — Progress Notes (Signed)
[facility] [address] [place], [place] [address]  Pulmonary Sleep Medicine  Office Visit Note  Patient Name: Kristen Schroeder DOB: [birth year] MRN [id number]  Date of Service: 01/28/2018  Complaints/HPI:  She is doing fairly well she has been using her oxygen as prescribed.  Has not had any patient's hospital.  Her spirometry shows stability over time.  States that she is using 4 L nasal cannula which is helping her.  Occasionally has swelling of her legs but that does improve with elevation  ROS  General: (-) fever, (-) chills, (-) night sweats, (-) weakness Skin: (-) rashes, (-) itching,. Eyes: (-) visual changes, (-) redness, (-) itching. Nose and Sinuses: (-) nasal stuffiness or itchiness, (-) postnasal drip, (-) nosebleeds, (-) sinus trouble. Mouth and Throat: (-) sore throat, (-) hoarseness. Neck: (-) swollen glands, (-) enlarged thyroid, (-) neck pain. Respiratory: - cough, (-) bloody sputum, + shortness of breath, - wheezing. Cardiovascular: - ankle swelling, (-) chest pain. Lymphatic: (-) lymph node enlargement. Neurologic: (-) numbness, (-) tingling. Psychiatric: (-) anxiety, (-) depression   Current Medication: Outpatient Encounter Medications as of 01/28/2018  Medication Sig Note  . aspirin EC 81 MG tablet Take 81 mg by mouth daily.   Marland Kitchen DALIRESP 500 MCG TABS tablet Take 500 mcg by mouth daily.    . DULERA 100-5 MCG/ACT AERO Inhale 2 puffs into the lungs 2 (two) times daily.    . fluticasone (FLONASE) 50 MCG/ACT nasal spray Place 1 spray into both nostrils daily as needed.    . furosemide (LASIX) 20 MG tablet Take 2 tablets (40 mg total) by mouth daily.   Marland Kitchen gabapentin (NEURONTIN) 300 MG capsule 2 tablets in the morning, 1 tablet in the afternoon and 2 tablets in the evening   . HYDROcodone-acetaminophen (NORCO) 10-325 MG tablet Take 1-2 tablets by mouth every 4 (four) hours as needed.   Marland Kitchen ipratropium-albuterol (DUONEB) 0.5-2.5 (3) MG/3ML SOLN TAKE 3 MLS  BY NEBULIZATION EVERY 6 (SIX) HOURS AS NEEDED.   Marland Kitchen isosorbide mononitrate (IMDUR) 30 MG 24 hr tablet TAKE 1 TABLET (30 MG TOTAL) BY MOUTH ONCE DAILY. 04/09/2017: Pt hasn't been takig qd, just prn  . levothyroxine (SYNTHROID, LEVOTHROID) 125 MCG tablet TAKE 1 TABLET BY MOUTH DAILY   . lisinopril (PRINIVIL,ZESTRIL) 20 MG tablet Take 1 tablet (20 mg total) by mouth daily.   Marland Kitchen loratadine (CLARITIN) 10 MG tablet TAKE 1 TABLET (10 MG TOTAL) BY MOUTH DAILY.   Marland Kitchen LORazepam (ATIVAN) 0.5 MG tablet Take 1 tablet (0.5 mg total) by mouth 2 (two) times daily as needed for anxiety.   . metFORMIN (GLUCOPHAGE) 1000 MG tablet TAKE 1 TABLET BY MOUTH TWICE A DAY (Patient taking differently: TAKE 1 TABLET BY MOUTH ONCE A DAY)   . MULTIPLE VITAMIN PO Take 1 tablet by mouth daily.    Marland Kitchen omeprazole (PRILOSEC) 40 MG capsule Take 1 capsule (40 mg total) by mouth daily.   . ondansetron (ZOFRAN-ODT) 4 MG disintegrating tablet TAKE 1 TABLET BY MOUTH EVERY 8 HOURS AS NEEDED FOR NAUSEA AND VOMITING   . ONE TOUCH ULTRA TEST test strip USE 1 STRIP 2 TIMES DAILY AND AS NEEDED   . oxyCODONE-acetaminophen (ROXICET) 5-325 MG tablet Take 1 tablet by mouth every 4 (four) hours as needed for severe pain.   Marland Kitchen PARoxetine (PAXIL) 10 MG tablet Take 1 tablet (10 mg total) at bedtime by mouth.   . pravastatin (PRAVACHOL) 10 MG tablet TAKE 1 TABLET (10 MG TOTAL) BY MOUTH DAILY.   Marland Kitchen  SPIRIVA HANDIHALER 18 MCG inhalation capsule Place 18 mcg into inhaler and inhale daily.     No facility-administered encounter medications on file as of 01/28/2018.     Surgical History: Past Surgical History:  Procedure Laterality Date  . ABDOMINAL HYSTERECTOMY    . CATARACT EXTRACTION W/PHACO Left 07/03/2016   Procedure: CATARACT EXTRACTION PHACO AND INTRAOCULAR LENS PLACEMENT (IOC);  Surgeon: Galen ManilaWilliam Porfilio, MD;  Location: [facility] ORS;  Service: Ophthalmology;  Laterality: Left;  Lot: [id number] H US: 00:40.1 AP%: 17.4 CDE:6.94  . TUBAL LIGATION      Medical  History: Past Medical History:  Diagnosis Date  . Arthritis   . COPD (chronic obstructive pulmonary disease) (HCC)   . Diabetes mellitus without complication (HCC)   . Dysrhythmia   . Heart murmur   . History of orthopnea   . Hypertension   . Hypothyroidism   . Neuropathy   . Oxygen dependent    3L  CONTINUOUS  . Pain CHRONIC BACK PAIN  . Shortness of breath dyspnea   . Wheezing     Family History: Family History  Problem Relation Age of Onset  . Heart disease Mother   . Drug abuse Other   . Hypertension Other     Social History: Social History   Socioeconomic History  . Marital status: Married    Spouse name: Not on file  . Number of children: 4  . Years of education: Not on file  . Highest education level: 8th grade  Occupational History  . Occupation: retired  Engineer, productionocial Needs  . Financial resource strain: Not hard at all  . Food insecurity:    Worry: Never true    Inability: Never true  . Transportation needs:    Medical: No    Non-medical: No  Tobacco Use  . Smoking status: Former Smoker    Packs/day: 0.50    Years: 15.00    Pack years: 7.50  . Smokeless tobacco: Never Used  . Tobacco comment: 30-40 years ago  Substance and Sexual Activity  . Alcohol use: No    Alcohol/week: 0.0 oz  . Drug use: No  . Sexual activity: Never  Lifestyle  . Physical activity:    Days per week: Not on file    Minutes per session: Not on file  . Stress: Not at all  Relationships  . Social connections:    Talks on phone: Not on file    Gets together: Not on file    Attends religious service: Not on file    Active member of club or organization: Not on file    Attends meetings of clubs or organizations: Not on file    Relationship status: Not on file  . Intimate partner violence:    Fear of current or ex partner: No    Emotionally abused: No    Physically abused: No    Forced sexual activity: No  Other Topics Concern  . Not on file  Social History Narrative  .  Not on file    Vital Signs: Blood pressure 140/66, pulse 95, resp. rate 16, height 5\' 7"  (1.702 m), weight 232 lb (105.2 kg), SpO2 94 %.  Examination: General Appearance: The patient is well-developed, well-nourished, and in no distress. Skin: Gross inspection of skin unremarkable. Head: normocephalic, no gross deformities. Eyes: no gross deformities noted. ENT: ears appear grossly normal no exudates. Neck: Supple. No thyromegaly. No LAD. Respiratory: diminished but clear at this time. Cardiovascular: Normal S1 and S2 without murmur or rub. Extremities:  No cyanosis. pulses are equal. Neurologic: Alert and oriented. No involuntary movements.  LABS: Recent Results (from the past 2160 hour(s))  B12     Status: None   Collection Time: 12/04/17 11:06 AM  Result Value Ref Range   Vitamin B-12 387 232 - 1,245 pg/mL  CBC with Differential/Platelet     Status: Abnormal   Collection Time: 12/04/17 11:06 AM  Result Value Ref Range   WBC 7.3 3.4 - 10.8 x10E3/uL   RBC 3.81 3.77 - 5.28 x10E6/uL   Hemoglobin 10.3 (L) 11.1 - 15.9 g/dL   Hematocrit 16.1 09.6 - 46.6 %   MCV 90 79 - 97 fL   MCH 27.0 26.6 - 33.0 pg   MCHC 30.0 (L) 31.5 - 35.7 g/dL   RDW 04.5 40.9 - 81.1 %   Platelets 255 150 - 379 x10E3/uL   Neutrophils 68 Not Estab. %   Lymphs 20 Not Estab. %   Monocytes 8 Not Estab. %   Eos 4 Not Estab. %   Basos 0 Not Estab. %   Neutrophils Absolute 4.9 1.4 - 7.0 x10E3/uL   Lymphocytes Absolute 1.5 0.7 - 3.1 x10E3/uL   Monocytes Absolute 0.6 0.1 - 0.9 x10E3/uL   EOS (ABSOLUTE) 0.3 0.0 - 0.4 x10E3/uL   Basophils Absolute 0.0 0.0 - 0.2 x10E3/uL   Immature Granulocytes 0 Not Estab. %   Immature Grans (Abs) 0.0 0.0 - 0.1 x10E3/uL  Comprehensive metabolic panel     Status: None   Collection Time: 12/04/17 11:06 AM  Result Value Ref Range   Glucose 84 65 - 99 mg/dL   BUN 11 8 - 27 mg/dL   Creatinine, Ser 9.14 0.57 - 1.00 mg/dL   GFR calc non Af Amer 89 >59 mL/min/1.73   GFR calc Af  Amer 102 >59 mL/min/1.73   BUN/Creatinine Ratio 18 12 - 28   Sodium 144 134 - 144 mmol/L   Potassium 4.4 3.5 - 5.2 mmol/L   Chloride 100 96 - 106 mmol/L   CO2 28 20 - 29 mmol/L   Calcium 9.3 8.7 - 10.3 mg/dL   Total Protein 6.6 6.0 - 8.5 g/dL   Albumin 4.0 3.5 - 4.8 g/dL   Globulin, Total 2.6 1.5 - 4.5 g/dL   Albumin/Globulin Ratio 1.5 1.2 - 2.2   Bilirubin Total <0.2 0.0 - 1.2 mg/dL   Alkaline Phosphatase 80 39 - 117 IU/L   AST 16 0 - 40 IU/L   ALT 6 0 - 32 IU/L  TSH     Status: None   Collection Time: 12/04/17 11:06 AM  Result Value Ref Range   TSH 1.630 0.450 - 4.500 uIU/mL  Lipid Panel With LDL/HDL Ratio     Status: None   Collection Time: 12/04/17 11:06 AM  Result Value Ref Range   Cholesterol, Total 185 100 - 199 mg/dL   Triglycerides 782 0 - 149 mg/dL   HDL 65 >95 mg/dL   VLDL Cholesterol Cal 26 5 - 40 mg/dL   LDL Calculated 94 0 - 99 mg/dL   LDl/HDL Ratio 1.4 0.0 - 3.2 ratio    Comment:                                     LDL/HDL Ratio  Men  Women                               1/2 Avg.Risk  1.0    1.5                                   Avg.Risk  3.6    3.2                                2X Avg.Risk  6.2    5.0                                3X Avg.Risk  8.0    6.1     Radiology: Mm Screening Breast Tomo Bilateral  Result Date: 10/23/2017 CLINICAL DATA:  Screening. EXAM: 2D DIGITAL SCREENING BILATERAL MAMMOGRAM WITH CAD AND ADJUNCT TOMO COMPARISON:  Previous exam(s). ACR Breast Density Category b: There are scattered areas of fibroglandular density. FINDINGS: There are no findings suspicious for malignancy. Images were processed with CAD. IMPRESSION: No mammographic evidence of malignancy. A result letter of this screening mammogram will be mailed directly to the patient. RECOMMENDATION: Screening mammogram in one year. (Code:SM-B-01Y) BI-RADS CATEGORY  1: Negative. Electronically Signed   By: Beckie Salts M.D.   On: 10/23/2017  10:18    No results found.  No results found.    Assessment and Plan: Patient Active Problem List   Diagnosis Date Noted  . Aneurysm (HCC) 07/10/2016  . Nonspecific abnormal finding 05/11/2015  . Allergic rhinitis 05/11/2015  . Anxiety 05/11/2015  . Bronchitis, chronic (HCC) 05/11/2015  . CCF (congestive cardiac failure) (HCC) 05/11/2015  . Clinical depression 05/11/2015  . 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 Essential (primary) hypertension 05/11/2015  . Acid reflux 05/11/2015  . HLD (hyperlipidemia) 05/11/2015  . Adult hypothyroidism 05/11/2015  . Cervical dysplasia, mild 05/11/2015  . Neuropathy 05/11/2015  . Adiposity 05/11/2015  . Obstructive apnea 05/11/2015  . Arthritis, degenerative 05/11/2015    1. COPD continue with current inhaler regimen we will continue with supportive care continue with oxygen therapy as prescribed 2. CHF stable at this no edema noted will continue to monitor fluid status 3. Chronic respiratory afailure with hypoxia on oxygen therapy and she is gaining benefit from the oxygen.  She will be continued on oxygen therapy as has been prescribed for her for the past 20 years  General Counseling: I have discussed the findings of the evaluation and examination with Sanaz.  I have also discussed any further diagnostic evaluation thatmay be needed or ordered today. Farida verbalizes understanding of the findings of todays visit. We also reviewed her medications today and discussed drug interactions and side effects including but not limited excessive drowsiness and altered mental states. We also discussed that there is always a risk not just to her but also people around her. she has been encouraged to call the office with any questions or concerns that should arise related to todays visit.    Time spent:  I have personally obtained a history, examined the patient, evaluated laboratory and imaging results, formulated the  assessment and plan and placed orders.    Yevonne Pax, MD [facility] Pulmonary and Critical Care Sleep medicine

## 2018-02-08 ENCOUNTER — Other Ambulatory Visit: Payer: Self-pay | Admitting: Family Medicine

## 2018-02-08 DIAGNOSIS — R600 Localized edema: Secondary | ICD-10-CM

## 2018-02-12 ENCOUNTER — Telehealth: Payer: Self-pay

## 2018-02-12 ENCOUNTER — Telehealth: Payer: Self-pay | Admitting: Family Medicine

## 2018-02-12 DIAGNOSIS — J449 Chronic obstructive pulmonary disease, unspecified: Secondary | ICD-10-CM | POA: Diagnosis not present

## 2018-02-12 NOTE — Telephone Encounter (Signed)
Pt stated that she went to pick up her SPIRIVA HANDIHALER 18 MCG inhalation capsule and [facility] advised pt she was in the "doughnut hole" and the medication was going to cost pt over 400$ out of pocket. Pt is requesting to pick up samples from the office if we have them available and if not, pt is requesting an Rx for a cheaper medication be sent to [facility]. (It looks like the last time Dr. Sullivan LoneGilbert prescribed medication was May 2016) Please advise. Thanks TNP

## 2018-02-12 NOTE — Telephone Encounter (Signed)
Dr Reece AgarG We do not have this kind.  We have the respimat ones and each of those are only 2 weeks worth.  With it only being April I don't think we will be able to keep her supplied for the year.  Is there something else she can try.

## 2018-02-12 NOTE — Telephone Encounter (Signed)
LMTCB ED 

## 2018-02-12 NOTE — Telephone Encounter (Signed)
Advised patient per Dr Reece AgarG to call her pulmo and ask switching to Norm SaltBrovona would be beneficial.

## 2018-02-13 ENCOUNTER — Other Ambulatory Visit: Payer: Self-pay

## 2018-02-13 MED ORDER — FLUTICASONE-UMECLIDIN-VILANT 100-62.5-25 MCG/INH IN AEPB: 1.0000 | INHALATION_SPRAY | Freq: Every day | RESPIRATORY_TRACT | 3 refills | Status: DC

## 2018-02-13 NOTE — Telephone Encounter (Signed)
As per dr Freda Munrosaadat khan gave sample for trelegy to pt and send pres to [facility]

## 2018-02-17 ENCOUNTER — Ambulatory Visit (INDEPENDENT_AMBULATORY_CARE_PROVIDER_SITE_OTHER): Payer: Medicare Other | Admitting: Family Medicine

## 2018-02-17 ENCOUNTER — Ambulatory Visit
Admission: RE | Admit: 2018-02-17 | Discharge: 2018-02-17 | Disposition: A | Discharge status: Home / Self Care | Payer: Medicare Other | Source: Ambulatory Visit | Attending: Family Medicine | Admitting: Family Medicine

## 2018-02-17 ENCOUNTER — Encounter: Payer: Self-pay | Admitting: Family Medicine

## 2018-02-17 VITALS — BP 118/58 | HR 101 | Temp 100.5°F | Resp 16

## 2018-02-17 DIAGNOSIS — R509 Fever, unspecified: Secondary | ICD-10-CM | POA: Diagnosis present

## 2018-02-17 DIAGNOSIS — R918 Other nonspecific abnormal finding of lung field: Secondary | ICD-10-CM | POA: Insufficient documentation

## 2018-02-17 DIAGNOSIS — R05 Cough: Secondary | ICD-10-CM | POA: Diagnosis not present

## 2018-02-17 DIAGNOSIS — R6889 Other general symptoms and signs: Secondary | ICD-10-CM

## 2018-02-17 LAB — POCT INFLUENZA A/B
Influenza A, POC: NEGATIVE
Influenza B, POC: NEGATIVE

## 2018-02-17 NOTE — Patient Instructions (Signed)
Cough, Adult  Coughing is a reflex that clears your throat and your airways. Coughing helps to heal and protect your lungs. It is normal to cough occasionally, but a cough that happens with other symptoms or lasts a long time may be a sign of a condition that needs treatment. A cough may last only 2-3 weeks (acute), or it may last longer than 8 weeks (chronic).  What are the causes?  Coughing is commonly caused by:   Breathing in substances that irritate your lungs.   A viral or bacterial respiratory infection.   Allergies.   Asthma.   Postnasal drip.   Smoking.   Acid backing up from the stomach into the esophagus (gastroesophageal reflux).   Certain medicines.   Chronic lung problems, including COPD (or rarely, lung cancer).   Other medical conditions such as heart failure.    Follow these instructions at home:  Pay attention to any changes in your symptoms. Take these actions to help with your discomfort:   Take medicines only as told by your health care provider.  ? If you were prescribed an antibiotic medicine, take it as told by your health care provider. Do not stop taking the antibiotic even if you start to feel better.  ? Talk with your health care provider before you take a cough suppressant medicine.   Drink enough fluid to keep your urine clear or pale yellow.   If the air is dry, use a cold steam vaporizer or humidifier in your bedroom or your home to help loosen secretions.   Avoid anything that causes you to cough at work or at home.   If your cough is worse at night, try sleeping in a semi-upright position.   Avoid cigarette smoke. If you smoke, quit smoking. If you need help quitting, ask your health care provider.   Avoid caffeine.   Avoid alcohol.   Rest as needed.    Contact a health care provider if:   You have new symptoms.   You cough up pus.   Your cough does not get better after 2-3 weeks, or your cough gets worse.   You cannot control your cough with suppressant  medicines and you are losing sleep.   You develop pain that is getting worse or pain that is not controlled with pain medicines.   You have a fever.   You have unexplained weight loss.   You have night sweats.  Get help right away if:   You cough up blood.   You have difficulty breathing.   Your heartbeat is very fast.  This information is not intended to replace advice given to you by your health care provider. Make sure you discuss any questions you have with your health care provider.  Document Released: 04/20/2011 Document Revised: 03/29/2016 Document Reviewed: 12/29/2014  Elsevier Interactive Patient Education  2018 Elsevier Inc[employer]

## 2018-02-17 NOTE — Progress Notes (Signed)
Patient: Kristen Schroeder Female    DOB: July 30, 1941   77 y.o.   MRN: [id number] Visit Date: 02/17/2018  Today's Provider: Shirlee LatchAngela Bacigalupo, MD   I, Joslyn HyEmily Ratchford, CMA, am acting as scribe for Shirlee LatchAngela Bacigalupo, MD.  Chief Complaint  Patient presents with  . Cough   Subjective:    Cough  This is a new problem. The current episode started yesterday. The cough is productive of sputum. Associated symptoms include chills, a fever, headaches, myalgias (body aches), nasal congestion (bloody), postnasal drip, shortness of breath (baseline) and sweats. Pertinent negatives include no chest pain, ear congestion, ear pain, hemoptysis, rhinorrhea, sore throat or wheezing. Treatments tried: Robitussin. The treatment provided mild relief. Her past medical history is significant for bronchitis and COPD.    She is on her home O2 at baseline.  No changes in that. No need for albuterol recently.    Allergies  Allergen Reactions  . Codeine Nausea And Vomiting and Nausea Only     Current Outpatient Medications:  .  aspirin EC 81 MG tablet, Take 81 mg by mouth daily., Disp: , Rfl:  .  DALIRESP 500 MCG TABS tablet, Take 500 mcg by mouth daily. , Disp: , Rfl:  .  DULERA 100-5 MCG/ACT AERO, Inhale 2 puffs into the lungs 2 (two) times daily. , Disp: , Rfl: 4 .  fluticasone (FLONASE) 50 MCG/ACT nasal spray, Place 1 spray into both nostrils daily as needed. , Disp: , Rfl:  .  furosemide (LASIX) 20 MG tablet, TAKE 1 TABLET BY MOUTH DAILY, Disp: 90 tablet, Rfl: 3 .  gabapentin (NEURONTIN) 300 MG capsule, 2 tablets in the morning, 1 tablet in the afternoon and 2 tablets in the evening, Disp: 450 capsule, Rfl: 3 .  ipratropium-albuterol (DUONEB) 0.5-2.5 (3) MG/3ML SOLN, TAKE 3 MLS BY NEBULIZATION EVERY 6 (SIX) HOURS AS NEEDED., Disp: 360 mL, Rfl: 1 .  isosorbide mononitrate (IMDUR) 30 MG 24 hr tablet, TAKE 1 TABLET (30 MG TOTAL) BY MOUTH ONCE DAILY., Disp: , Rfl:  .  levothyroxine (SYNTHROID, LEVOTHROID) 125  MCG tablet, TAKE 1 TABLET BY MOUTH DAILY, Disp: 90 tablet, Rfl: 3 .  lisinopril (PRINIVIL,ZESTRIL) 20 MG tablet, Take 1 tablet (20 mg total) by mouth daily., Disp: 90 tablet, Rfl: 3 .  loratadine (CLARITIN) 10 MG tablet, TAKE 1 TABLET (10 MG TOTAL) BY MOUTH DAILY., Disp: 90 tablet, Rfl: 3 .  LORazepam (ATIVAN) 0.5 MG tablet, TAKE 1 TABLET BY MOUTH 2 TIMES DAILY AS NEEDED FOR ANXIETY, Disp: 60 tablet, Rfl: 4 .  metFORMIN (GLUCOPHAGE) 1000 MG tablet, TAKE 1 TABLET BY MOUTH TWICE A DAY (Patient taking differently: TAKE 1 TABLET BY MOUTH ONCE A DAY), Disp: 180 tablet, Rfl: 3 .  MULTIPLE VITAMIN PO, Take 1 tablet by mouth daily. , Disp: , Rfl:  .  omeprazole (PRILOSEC) 40 MG capsule, Take 1 capsule (40 mg total) by mouth daily., Disp: 90 capsule, Rfl: 1 .  ondansetron (ZOFRAN-ODT) 4 MG disintegrating tablet, TAKE 1 TABLET BY MOUTH EVERY 8 HOURS AS NEEDED FOR NAUSEA AND VOMITING, Disp: 20 tablet, Rfl: 3 .  ONE TOUCH ULTRA TEST test strip, USE 1 STRIP 2 TIMES DAILY AND AS NEEDED, Disp: 100 each, Rfl: 12 .  PARoxetine (PAXIL) 10 MG tablet, Take 1 tablet (10 mg total) at bedtime by mouth., Disp: 90 tablet, Rfl: 3 .  pravastatin (PRAVACHOL) 10 MG tablet, TAKE 1 TABLET (10 MG TOTAL) BY MOUTH DAILY., Disp: 90 tablet, Rfl: 3 .  Fluticasone-Umeclidin-Vilant (TRELEGY  ELLIPTA) 100-62.5-25 MCG/INH AEPB, Inhale 1 puff into the lungs daily. (Patient not taking: Reported on 02/17/2018), Disp: 1 each, Rfl: 3 .  HYDROcodone-acetaminophen (NORCO) 10-325 MG tablet, Take 1-2 tablets by mouth every 4 (four) hours as needed. (Patient not taking: Reported on 02/17/2018), Disp: 200 tablet, Rfl: 0 .  oxyCODONE-acetaminophen (ROXICET) 5-325 MG tablet, Take 1 tablet by mouth every 4 (four) hours as needed for severe pain. (Patient not taking: Reported on 02/17/2018), Disp: 20 tablet, Rfl: 0  Review of Systems  Constitutional: Positive for chills and fever.  HENT: Positive for postnasal drip. Negative for ear pain, rhinorrhea and sore  throat.   Respiratory: Positive for cough and shortness of breath (baseline). Negative for hemoptysis and wheezing.   Cardiovascular: Negative for chest pain.  Musculoskeletal: Positive for myalgias (body aches).  Neurological: Positive for headaches.    Social History   Tobacco Use  . Smoking status: Former Smoker    Packs/day: 0.50    Years: 15.00    Pack years: 7.50  . Smokeless tobacco: Never Used  . Tobacco comment: 30-40 years ago  Substance Use Topics  . Alcohol use: No    Alcohol/week: 0.0 oz   Objective:   BP (!) 118/58 (BP Location: Right Arm, Patient Position: Sitting, Cuff Size: Large)   Pulse (!) 101   Temp (!) 100.5 F (38.1 C) (Oral)   Resp 16   SpO2 92% Comment: 4 L O2 Vitals:   02/17/18 1057  BP: (!) 118/58  Pulse: (!) 101  Resp: 16  Temp: (!) 100.5 F (38.1 C)  TempSrc: Oral  SpO2: 92%     Physical Exam  Constitutional: She is oriented to person, place, and time. She appears well-developed and well-nourished. No distress.  HENT:  Head: Normocephalic and atraumatic.  Eyes: Conjunctivae are normal. No scleral icterus.  Neck: Neck supple. No thyromegaly present.  Cardiovascular: Normal rate and regular rhythm.  Pulmonary/Chest: Effort normal and breath sounds normal. No respiratory distress. She has no wheezes. She has no rales.  Musculoskeletal: She exhibits no edema.  Lymphadenopathy:    She has no cervical adenopathy.  Neurological: She is alert and oriented to person, place, and time.  Skin: Skin is warm and dry. Capillary refill takes less than 2 seconds. No rash noted.  Psychiatric: She has a normal mood and affect. Her behavior is normal.  Vitals reviewed.   Results for orders placed or performed in visit on 02/17/18  POCT Influenza A/B  Result Value Ref Range   Influenza A, POC Negative Negative   Influenza B, POC Negative Negative       Assessment & Plan:  1. Flu-like symptoms - symptoms and exam c/w flu-like illness - no  evidence of strep pharyngitis, COPD exacerbation (no wheeze), AOM, bacterial sinusitis, or other bacterial infection - flu negative - get CXR to ensure no occult pneumonia - will treat for CAP if present - discussed symptomatic management, natural course, and return precautions - DG Chest 2 View; Future   Return if symptoms worsen or fail to improve.   The entirety of the information documented in the History of Present Illness, Review of Systems and Physical Exam were personally obtained by me. Portions of this information were initially documented by Irving Burton Ratchford, CMA and reviewed by me for thoroughness and accuracy.    Erasmo Downer, MD, MPH [facility] 02/17/2018 3:12 PM

## 2018-02-18 ENCOUNTER — Telehealth: Payer: Self-pay

## 2018-02-18 NOTE — Telephone Encounter (Signed)
Pt advised and agrees with treatment plan. 

## 2018-02-18 NOTE — Telephone Encounter (Signed)
-----   Message from Erasmo DownerAngela M Bacigalupo, MD sent at 02/17/2018  4:47 PM EDT ----- No acute changes on chest x-ray.  Nothing to suggest pneumonia.  There are chronic lung changes that continue to be seen.  These are related to her chronic lung disease.  Symptomatic treatment for her cough as we discussed.  Erasmo DownerBacigalupo, Angela M, MD, MPH [facility] 02/17/2018 4:47 PM

## 2018-02-19 ENCOUNTER — Telehealth: Payer: Self-pay | Admitting: Family Medicine

## 2018-02-19 NOTE — Telephone Encounter (Signed)
Pt advised and agrees with treatment plan. 

## 2018-02-19 NOTE — Telephone Encounter (Signed)
She can take Mucinex and make sure to use her flonase daily.  If she still has symptoms after total of 10 days, call back.  If she is getting worse, come back in to be re-evaluated.  Erasmo DownerBacigalupo, Angela M, MD, MPH [facility] 02/19/2018 10:19 AM

## 2018-02-19 NOTE — Telephone Encounter (Signed)
Please review

## 2018-02-19 NOTE — Telephone Encounter (Signed)
Pt stated she saw Dr. Leonard SchwartzB for OV on 02/17/18. Pt stated that she has been resting in bed since. Pt stated that her head is hurting like she has sinus pressure. Pt is asking if there is something she can take to help. [facility]. Please advise. Thanks TNP

## 2018-03-04 ENCOUNTER — Telehealth: Payer: Self-pay | Admitting: Family Medicine

## 2018-03-04 ENCOUNTER — Ambulatory Visit (INDEPENDENT_AMBULATORY_CARE_PROVIDER_SITE_OTHER): Payer: Medicare Other | Admitting: Family Medicine

## 2018-03-04 VITALS — BP 116/62 | HR 75 | Temp 98.7°F | Resp 15

## 2018-03-04 DIAGNOSIS — D649 Anemia, unspecified: Secondary | ICD-10-CM

## 2018-03-04 DIAGNOSIS — I10 Essential (primary) hypertension: Secondary | ICD-10-CM

## 2018-03-04 DIAGNOSIS — R0902 Hypoxemia: Secondary | ICD-10-CM

## 2018-03-04 DIAGNOSIS — E118 Type 2 diabetes mellitus with unspecified complications: Secondary | ICD-10-CM

## 2018-03-04 DIAGNOSIS — R739 Hyperglycemia, unspecified: Secondary | ICD-10-CM

## 2018-03-04 DIAGNOSIS — J449 Chronic obstructive pulmonary disease, unspecified: Secondary | ICD-10-CM

## 2018-03-04 DIAGNOSIS — E7849 Other hyperlipidemia: Secondary | ICD-10-CM | POA: Diagnosis not present

## 2018-03-04 LAB — POCT GLYCOSYLATED HEMOGLOBIN (HGB A1C): Hemoglobin A1C: 5.3

## 2018-03-04 MED ORDER — ARFORMOTEROL TARTRATE 15 MCG/2ML IN NEBU: 15.0000 ug | INHALATION_SOLUTION | Freq: Two times a day (BID) | RESPIRATORY_TRACT | 11 refills | Status: DC

## 2018-03-04 MED ORDER — PREDNISONE 5 MG (48) PO TBPK: ORAL_TABLET | ORAL | 0 refills | Status: DC

## 2018-03-04 NOTE — Telephone Encounter (Signed)
Dolly with [facility] [phone] called and states the medication Rosalyn Gess is going to cost the patient over $200.  She is wanting to know if it could be changed or how they could help the patient.

## 2018-03-04 NOTE — Telephone Encounter (Signed)
dOES PHARMACY KNOW OF GENERIC EQUIVALENT FOR THIS FOR copd.?

## 2018-03-04 NOTE — Progress Notes (Signed)
Patient: Kristen Schroeder Female    DOB: [birth year]   77 y.o.   MRN: [id number] Visit Date: 03/04/2018  Today's Provider: Megan Mans, MD   Chief Complaint  Patient presents with  . Diabetes  . Anxiety  . Hypertension   Subjective:    Anxiety  Presents for follow-up visit. Symptoms include depressed mood, nervous/anxious behavior, panic, restlessness and shortness of breath. Patient reports no chest pain, confusion, decreased concentration, dizziness, dry mouth, excessive worry, feeling of choking, hyperventilation, impotence, insomnia, irritability, malaise, muscle tension, nausea, obsessions, palpitations or suicidal ideas. Symptoms occur occasionally. The quality of sleep is good. Nighttime awakenings: none.   Compliance with medications is 76-100%.  Headache   This is a recurrent problem. The current episode started 1 to 4 weeks ago. The problem occurs intermittently. The problem has been unchanged. The pain is located in the frontal region. The pain quality is not similar to prior headaches. Pertinent negatives include no dizziness, insomnia or nausea. The symptoms are aggravated by weather changes (patient believes that with pollen being out she has had a increase in headache). Treatments tried: vicks vapor rub.        Diabetes Mellitus Type II, Follow-up:   Lab Results  Component Value Date   HGBA1C 5.4 08/26/2017   HGBA1C 5.1 04/22/2017   HGBA1C 5.2 12/25/2016   Last seen for diabetes 6 months ago.  Management since then includes none. She reports excellent compliance with treatment. She is not having side effects.  Current symptoms include paresthesia of the feet and have been unchanged. Home blood sugar records: fasting range: 140s  Episodes of hypoglycemia? no   Current Insulin Regimen: N/A Most Recent Eye Exam: 03/22/17 Weight trend: stable Prior visit with dietician: yes - Current diet: in general, a "healthy" diet   Current exercise:  none  ------------------------------------------------------------------------   Hypertension, follow-up:  BP Readings from Last 3 Encounters:  03/04/18 116/62  02/17/18 (!) 118/58  01/28/18 140/66    She was last seen for hypertension 6 months ago.  BP at that visit was . Management since that visit includes none.She reports excellent compliance with treatment. She is not having side effects.  She is exercising. She is adherent to low salt diet.   Outside blood pressures are fairly within range. She is experiencing none.  Patient denies chest pain, chest pressure/discomfort, exertional chest pressure/discomfort, fatigue, irregular heart beat, lower extremity edema, near-syncope, orthopnea, palpitations, paroxysmal nocturnal dyspnea, syncope and tachypnea.   Cardiovascular risk factors include advanced age (older than 8 for men, 95 for women), diabetes mellitus, hypertension and obesity (BMI >= 30 kg/m2).  Use of agents associated with hypertension: none.   ------------------------------------------------------------------------    Allergies  Allergen Reactions  . Codeine Nausea And Vomiting and Nausea Only     Current Outpatient Medications:  .  aspirin EC 81 MG tablet, Take 81 mg by mouth daily., Disp: , Rfl:  .  DALIRESP 500 MCG TABS tablet, Take 500 mcg by mouth daily. , Disp: , Rfl:  .  DULERA 100-5 MCG/ACT AERO, Inhale 2 puffs into the lungs 2 (two) times daily. , Disp: , Rfl: 4 .  fluticasone (FLONASE) 50 MCG/ACT nasal spray, Place 1 spray into both nostrils daily as needed. , Disp: , Rfl:  .  Fluticasone-Umeclidin-Vilant (TRELEGY ELLIPTA) 100-62.5-25 MCG/INH AEPB, Inhale 1 puff into the lungs daily. (Patient not taking: Reported on 02/17/2018), Disp: 1 each, Rfl: 3 .  furosemide (LASIX) 20 MG tablet, TAKE  1 TABLET BY MOUTH DAILY, Disp: 90 tablet, Rfl: 3 .  gabapentin (NEURONTIN) 300 MG capsule, 2 tablets in the morning, 1 tablet in the afternoon and 2 tablets in the  evening, Disp: 450 capsule, Rfl: 3 .  HYDROcodone-acetaminophen (NORCO) 10-325 MG tablet, Take 1-2 tablets by mouth every 4 (four) hours as needed. (Patient not taking: Reported on 02/17/2018), Disp: 200 tablet, Rfl: 0 .  ipratropium-albuterol (DUONEB) 0.5-2.5 (3) MG/3ML SOLN, TAKE 3 MLS BY NEBULIZATION EVERY 6 (SIX) HOURS AS NEEDED., Disp: 360 mL, Rfl: 1 .  isosorbide mononitrate (IMDUR) 30 MG 24 hr tablet, TAKE 1 TABLET (30 MG TOTAL) BY MOUTH ONCE DAILY., Disp: , Rfl:  .  levothyroxine (SYNTHROID, LEVOTHROID) 125 MCG tablet, TAKE 1 TABLET BY MOUTH DAILY, Disp: 90 tablet, Rfl: 3 .  lisinopril (PRINIVIL,ZESTRIL) 20 MG tablet, Take 1 tablet (20 mg total) by mouth daily., Disp: 90 tablet, Rfl: 3 .  loratadine (CLARITIN) 10 MG tablet, TAKE 1 TABLET (10 MG TOTAL) BY MOUTH DAILY., Disp: 90 tablet, Rfl: 3 .  LORazepam (ATIVAN) 0.5 MG tablet, TAKE 1 TABLET BY MOUTH 2 TIMES DAILY AS NEEDED FOR ANXIETY, Disp: 60 tablet, Rfl: 4 .  metFORMIN (GLUCOPHAGE) 1000 MG tablet, TAKE 1 TABLET BY MOUTH TWICE A DAY (Patient taking differently: TAKE 1 TABLET BY MOUTH ONCE A DAY), Disp: 180 tablet, Rfl: 3 .  MULTIPLE VITAMIN PO, Take 1 tablet by mouth daily. , Disp: , Rfl:  .  omeprazole (PRILOSEC) 40 MG capsule, Take 1 capsule (40 mg total) by mouth daily., Disp: 90 capsule, Rfl: 1 .  ondansetron (ZOFRAN-ODT) 4 MG disintegrating tablet, TAKE 1 TABLET BY MOUTH EVERY 8 HOURS AS NEEDED FOR NAUSEA AND VOMITING, Disp: 20 tablet, Rfl: 3 .  ONE TOUCH ULTRA TEST test strip, USE 1 STRIP 2 TIMES DAILY AND AS NEEDED, Disp: 100 each, Rfl: 12 .  oxyCODONE-acetaminophen (ROXICET) 5-325 MG tablet, Take 1 tablet by mouth every 4 (four) hours as needed for severe pain. (Patient not taking: Reported on 02/17/2018), Disp: 20 tablet, Rfl: 0 .  PARoxetine (PAXIL) 10 MG tablet, Take 1 tablet (10 mg total) at bedtime by mouth., Disp: 90 tablet, Rfl: 3 .  pravastatin (PRAVACHOL) 10 MG tablet, TAKE 1 TABLET (10 MG TOTAL) BY MOUTH DAILY., Disp: 90  tablet, Rfl: 3  Review of Systems  Constitutional: Negative.  Negative for irritability.  HENT: Negative.   Eyes: Negative.   Respiratory: Positive for shortness of breath.   Cardiovascular: Negative for chest pain and palpitations.  Gastrointestinal: Negative for nausea.  Endocrine: Negative.   Genitourinary: Negative for impotence.  Musculoskeletal: Negative.   Allergic/Immunologic: Negative.   Neurological: Positive for headaches. Negative for dizziness.  Hematological: Negative.   Psychiatric/Behavioral: Negative for confusion, decreased concentration and suicidal ideas. The patient is nervous/anxious. The patient does not have insomnia.     Social History   Tobacco Use  . Smoking status: Former Smoker    Packs/day: 0.50    Years: 15.00    Pack years: 7.50  . Smokeless tobacco: Never Used  . Tobacco comment: 30-40 years ago  Substance Use Topics  . Alcohol use: No    Alcohol/week: 0.0 oz   Objective:   BP 116/62   Pulse 75   Temp 98.7 F (37.1 C) (Oral)   Resp 15   SpO2 (!) 83% Comment: currently on 4liters of oxygen Vitals:   03/04/18 1140  BP: 116/62  Pulse: 75  Resp: 15  Temp: 98.7 F (37.1 C)  TempSrc: Oral  SpO2: (!) 83%     Physical Exam  Constitutional: She is oriented to person, place, and time. She appears well-developed and well-nourished.  HENT:  Head: Normocephalic and atraumatic.  Eyes: Pupils are equal, round, and reactive to light.  Neck: Neck supple.  Cardiovascular: Normal rate, regular rhythm and normal heart sounds.  Pulmonary/Chest: Effort normal and breath sounds normal.  Abdominal: Soft.  Neurological: She is alert and oriented to person, place, and time. She has normal strength.  Skin: Skin is warm and dry.  Psychiatric: She has a normal mood and affect. Her behavior is normal.        Assessment & Plan:     1. Type 2 diabetes mellitus with complication, without long-term current use of insulin (HCC)  - POCT glycosylated  hemoglobin (Hb A1C)--5.3 today.  2. Hyperglycemia   3. Chronic obstructive pulmonary disease, unspecified COPD type (HCC)  - predniSONE (STERAPRED UNI-PAK 48 TAB) 5 MG (48) TBPK tablet; Take pack as directed  Dispense: 48 tablet; Refill: 0 - arformoterol (BROVANA) 15 MCG/2ML NEBU; Take 2 mLs (15 mcg total) by nebulization 2 (two) times daily.  Dispense: 120 mL; Refill: 11  4. Hypoxia  - predniSONE (STERAPRED UNI-PAK 48 TAB) 5 MG (48) TBPK tablet; Take pack as directed  Dispense: 48 tablet; Refill: 0  5. Anemia, unspecified type  - CBC with Differential/Platelet  6. Essential hypertension  - TSH  7. Other hyperlipidemia  - Comprehensive metabolic panel      I have done the exam and reviewed the above chart and it is accurate to the best of my knowledge. Dentist has been used in this note in any air is in the dictation or transcription are unintentional.  Megan Mans, MD  [facility]

## 2018-03-04 NOTE — Patient Instructions (Signed)
Stop Spiriva

## 2018-03-04 NOTE — Telephone Encounter (Signed)
Dolly suggested [employer] 50 mcg.

## 2018-03-05 LAB — COMPREHENSIVE METABOLIC PANEL
ALT: 6 IU/L (ref 0–32)
AST: 13 IU/L (ref 0–40)
Albumin/Globulin Ratio: 1.7 (ref 1.2–2.2)
Albumin: 3.7 g/dL (ref 3.5–4.8)
Alkaline Phosphatase: 67 IU/L (ref 39–117)
BUN/Creatinine Ratio: 24 (ref 12–28)
BUN: 13 mg/dL (ref 8–27)
Bilirubin Total: <0.2 mg/dL (ref 0.0–1.2)
CO2: 31 mmol/L — ABNORMAL HIGH (ref 20–29)
Calcium: 9.1 mg/dL (ref 8.7–10.3)
Chloride: 104 mmol/L (ref 96–106)
Creatinine, Ser: 0.54 mg/dL — ABNORMAL LOW (ref 0.57–1.00)
GFR calc Af Amer: 106 mL/min/{1.73_m2} (ref >59)
GFR calc non Af Amer: 92 mL/min/{1.73_m2} (ref >59)
Globulin, Total: 2.2 g/dL (ref 1.5–4.5)
Glucose: 83 mg/dL (ref 65–99)
Potassium: 4 mmol/L (ref 3.5–5.2)
Sodium: 149 mmol/L — ABNORMAL HIGH (ref 134–144)
Total Protein: 5.9 g/dL — ABNORMAL LOW (ref 6.0–8.5)

## 2018-03-05 LAB — CBC WITH DIFFERENTIAL/PLATELET
Basophils Absolute: 0 10*3/uL (ref 0.0–0.2)
Basos: 0 %
EOS (ABSOLUTE): 0.2 10*3/uL (ref 0.0–0.4)
Eos: 3 %
Hematocrit: 30.7 % — ABNORMAL LOW (ref 34.0–46.6)
Hemoglobin: 9.3 g/dL — ABNORMAL LOW (ref 11.1–15.9)
Immature Grans (Abs): 0 10*3/uL (ref 0.0–0.1)
Immature Granulocytes: 0 %
Lymphocytes Absolute: 1.2 10*3/uL (ref 0.7–3.1)
Lymphs: 16 %
MCH: 27.4 pg (ref 26.6–33.0)
MCHC: 30.3 g/dL — ABNORMAL LOW (ref 31.5–35.7)
MCV: 91 fL (ref 79–97)
Monocytes Absolute: 0.7 10*3/uL (ref 0.1–0.9)
Monocytes: 9 %
Neutrophils Absolute: 5.4 10*3/uL (ref 1.4–7.0)
Neutrophils: 72 %
Platelets: 242 10*3/uL (ref 150–379)
RBC: 3.39 x10E6/uL — ABNORMAL LOW (ref 3.77–5.28)
RDW: 14.6 % (ref 12.3–15.4)
WBC: 7.5 10*3/uL (ref 3.4–10.8)

## 2018-03-05 LAB — TSH: TSH: 0.877 u[IU]/mL (ref 0.450–4.500)

## 2018-03-07 NOTE — Telephone Encounter (Signed)
Not the same--is duoneb now generic?

## 2018-03-10 NOTE — Telephone Encounter (Signed)
Dr Sullivan Lone, she is already on the [facility]

## 2018-03-11 MED ORDER — SALMETEROL XINAFOATE 50 MCG/DOSE IN AEPB: 1.0000 | INHALATION_SPRAY | Freq: Every day | RESPIRATORY_TRACT | 12 refills | Status: DC

## 2018-03-11 NOTE — Telephone Encounter (Signed)
We can add serevent discus if generic.

## 2018-03-12 ENCOUNTER — Other Ambulatory Visit: Payer: Self-pay

## 2018-03-13 ENCOUNTER — Other Ambulatory Visit: Payer: Self-pay

## 2018-03-13 ENCOUNTER — Telehealth: Payer: Self-pay

## 2018-03-13 MED ORDER — TIOTROPIUM BROMIDE MONOHYDRATE 18 MCG IN CAPS: 18.0000 ug | ORAL_CAPSULE | Freq: Every day | RESPIRATORY_TRACT | 4 refills | Status: DC

## 2018-03-13 MED ORDER — MOMETASONE FURO-FORMOTEROL FUM 100-5 MCG/ACT IN AERO: 2.0000 | INHALATION_SPRAY | Freq: Two times a day (BID) | RESPIRATORY_TRACT | 3 refills | Status: DC

## 2018-03-13 MED ORDER — TIOTROPIUM BROMIDE MONOHYDRATE 18 MCG IN CAPS: 18.0000 ug | ORAL_CAPSULE | Freq: Every day | RESPIRATORY_TRACT | 3 refills | Status: DC

## 2018-03-13 MED ORDER — MOMETASONE FURO-FORMOTEROL FUM 100-5 MCG/ACT IN AERO: 2.0000 | INHALATION_SPRAY | Freq: Two times a day (BID) | RESPIRATORY_TRACT | 4 refills | Status: DC

## 2018-03-13 NOTE — Telephone Encounter (Signed)
Left message to call back  

## 2018-03-13 NOTE — Telephone Encounter (Signed)
-----   Message from Maple Hudson., MD sent at 03/13/2018  3:48 PM EDT ----- stable

## 2018-03-14 DIAGNOSIS — J449 Chronic obstructive pulmonary disease, unspecified: Secondary | ICD-10-CM | POA: Diagnosis not present

## 2018-03-14 NOTE — Telephone Encounter (Signed)
Patient advised.KW 

## 2018-03-19 ENCOUNTER — Telehealth: Payer: Self-pay

## 2018-03-19 NOTE — Telephone Encounter (Signed)
Pt advised samples for Dulera 100 /88mcg is ready for pickup

## 2018-03-20 ENCOUNTER — Other Ambulatory Visit: Payer: Self-pay

## 2018-03-20 MED ORDER — MOMETASONE FURO-FORMOTEROL FUM 100-5 MCG/ACT IN AERO: 2.0000 | INHALATION_SPRAY | Freq: Two times a day (BID) | RESPIRATORY_TRACT | 3 refills | Status: DC

## 2018-03-20 MED ORDER — IPRATROPIUM-ALBUTEROL 0.5-2.5 (3) MG/3ML IN SOLN: 3.0000 mL | Freq: Four times a day (QID) | RESPIRATORY_TRACT | 1 refills | Status: DC | PRN

## 2018-03-20 MED ORDER — TIOTROPIUM BROMIDE MONOHYDRATE 18 MCG IN CAPS: 18.0000 ug | ORAL_CAPSULE | Freq: Every day | RESPIRATORY_TRACT | 3 refills | Status: DC

## 2018-04-01 ENCOUNTER — Ambulatory Visit (INDEPENDENT_AMBULATORY_CARE_PROVIDER_SITE_OTHER): Payer: Medicare Other | Admitting: Family Medicine

## 2018-04-01 ENCOUNTER — Telehealth: Payer: Self-pay

## 2018-04-01 VITALS — BP 132/70 | HR 108 | Temp 98.4°F | Wt 214.0 lb

## 2018-04-01 DIAGNOSIS — E6609 Other obesity due to excess calories: Secondary | ICD-10-CM | POA: Diagnosis not present

## 2018-04-01 DIAGNOSIS — I10 Essential (primary) hypertension: Secondary | ICD-10-CM | POA: Diagnosis not present

## 2018-04-01 DIAGNOSIS — J42 Unspecified chronic bronchitis: Secondary | ICD-10-CM

## 2018-04-01 DIAGNOSIS — I509 Heart failure, unspecified: Secondary | ICD-10-CM

## 2018-04-01 DIAGNOSIS — Z6833 Body mass index (BMI) 33.0-33.9, adult: Secondary | ICD-10-CM | POA: Diagnosis not present

## 2018-04-01 DIAGNOSIS — G4733 Obstructive sleep apnea (adult) (pediatric): Secondary | ICD-10-CM | POA: Diagnosis not present

## 2018-04-01 NOTE — Telephone Encounter (Signed)
Pt called today her concentrator is not working and I called james from [place] he said he will take care

## 2018-04-01 NOTE — Progress Notes (Signed)
Kristen Schroeder  MRN: [id number] DOB: 15-Dec-1940  Subjective:  HPI   The patient is a 77 year old female who presents today for follow up.  She was last seen on 03/04/18.  She states that her oxygen concentrator broke on Friday night and she has been having difficulty getting [employer] to help her get another one.    She states that up until Friday her breathing was doing a little better.  Since Friday she has been so stressed over the oxygen that it is now worse.  However, her PO2 at home has been running 91-91%.  Patient Active Problem List   Diagnosis Date Noted  . Aneurysm (HCC) 07/10/2016  . Nonspecific abnormal finding 05/11/2015  . Allergic rhinitis 05/11/2015  . Anxiety 05/11/2015  . Bronchitis, chronic (HCC) 05/11/2015  . CCF (congestive cardiac failure) (HCC) 05/11/2015  . Clinical depression 05/11/2015  . 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 Essential (primary) hypertension 05/11/2015  . Acid reflux 05/11/2015  . HLD (hyperlipidemia) 05/11/2015  . Adult hypothyroidism 05/11/2015  . Cervical dysplasia, mild 05/11/2015  . Neuropathy 05/11/2015  . Adiposity 05/11/2015  . Obstructive apnea 05/11/2015  . Arthritis, degenerative 05/11/2015    Past Medical History:  Diagnosis Date  . Arthritis   . COPD (chronic obstructive pulmonary disease) (HCC)   . Diabetes mellitus without complication (HCC)   . Dysrhythmia   . Heart murmur   . History of orthopnea   . Hypertension   . Hypothyroidism   . Neuropathy   . Oxygen dependent    3L  CONTINUOUS  . Pain CHRONIC BACK PAIN  . Shortness of breath dyspnea   . Wheezing     Social History   Socioeconomic History  . Marital status: Married    Spouse name: Not on file  . Number of children: 4  . Years of education: Not on file  . Highest education level: 8th grade  Occupational History  . Occupation: retired  Engineer, production  . Financial resource strain: Not hard at all  . Food insecurity:    Worry:  Never true    Inability: Never true  . Transportation needs:    Medical: No    Non-medical: No  Tobacco Use  . Smoking status: Former Smoker    Packs/day: 0.50    Years: 15.00    Pack years: 7.50  . Smokeless tobacco: Never Used  . Tobacco comment: 30-40 years ago  Substance and Sexual Activity  . Alcohol use: No    Alcohol/week: 0.0 oz  . Drug use: No  . Sexual activity: Never  Lifestyle  . Physical activity:    Days per week: Not on file    Minutes per session: Not on file  . Stress: Not at all  Relationships  . Social connections:    Talks on phone: Not on file    Gets together: Not on file    Attends religious service: Not on file    Active member of club or organization: Not on file    Attends meetings of clubs or organizations: Not on file    Relationship status: Not on file  . Intimate partner violence:    Fear of current or ex partner: No    Emotionally abused: No    Physically abused: No    Forced sexual activity: No  Other Topics Concern  . Not on file  Social History Narrative  . Not on file    Outpatient Encounter Medications as of  04/01/2018  Medication Sig Note  . arformoterol (BROVANA) 15 MCG/2ML NEBU Take 2 mLs (15 mcg total) by nebulization 2 (two) times daily.   Marland Kitchen aspirin EC 81 MG tablet Take 81 mg by mouth daily.   Marland Kitchen DALIRESP 500 MCG TABS tablet Take 500 mcg by mouth daily.    . fluticasone (FLONASE) 50 MCG/ACT nasal spray Place 1 spray into both nostrils daily as needed.    . furosemide (LASIX) 20 MG tablet TAKE 1 TABLET BY MOUTH DAILY   . gabapentin (NEURONTIN) 300 MG capsule 2 tablets in the morning, 1 tablet in the afternoon and 2 tablets in the evening   . ipratropium-albuterol (DUONEB) 0.5-2.5 (3) MG/3ML SOLN Take 3 mLs by nebulization every 6 (six) hours as needed.   . isosorbide mononitrate (IMDUR) 30 MG 24 hr tablet TAKE 1 TABLET (30 MG TOTAL) BY MOUTH ONCE DAILY. 04/09/2017: Pt hasn't been takig qd, just prn  . levothyroxine (SYNTHROID,  LEVOTHROID) 125 MCG tablet TAKE 1 TABLET BY MOUTH DAILY   . lisinopril (PRINIVIL,ZESTRIL) 20 MG tablet Take 1 tablet (20 mg total) by mouth daily.   Marland Kitchen loratadine (CLARITIN) 10 MG tablet TAKE 1 TABLET (10 MG TOTAL) BY MOUTH DAILY.   Marland Kitchen LORazepam (ATIVAN) 0.5 MG tablet TAKE 1 TABLET BY MOUTH 2 TIMES DAILY AS NEEDED FOR ANXIETY   . metFORMIN (GLUCOPHAGE) 1000 MG tablet TAKE 1 TABLET BY MOUTH TWICE A DAY (Patient taking differently: TAKE 1 TABLET BY MOUTH ONCE A DAY)   . mometasone-formoterol (DULERA) 100-5 MCG/ACT AERO Inhale 2 puffs into the lungs 2 (two) times daily.   . MULTIPLE VITAMIN PO Take 1 tablet by mouth daily.    Marland Kitchen omeprazole (PRILOSEC) 40 MG capsule Take 1 capsule (40 mg total) by mouth daily.   . ondansetron (ZOFRAN-ODT) 4 MG disintegrating tablet TAKE 1 TABLET BY MOUTH EVERY 8 HOURS AS NEEDED FOR NAUSEA AND VOMITING   . ONE TOUCH ULTRA TEST test strip USE 1 STRIP 2 TIMES DAILY AND AS NEEDED   . PARoxetine (PAXIL) 10 MG tablet Take 1 tablet (10 mg total) at bedtime by mouth.   . pravastatin (PRAVACHOL) 10 MG tablet TAKE 1 TABLET (10 MG TOTAL) BY MOUTH DAILY.   . salmeterol (SEREVENT) 50 MCG/DOSE diskus inhaler Inhale 1 puff into the lungs daily.   . [DISCONTINUED] Fluticasone-Umeclidin-Vilant (TRELEGY ELLIPTA) 100-62.5-25 MCG/INH AEPB Inhale 1 puff into the lungs daily. (Patient not taking: Reported on 02/17/2018)   . [DISCONTINUED] HYDROcodone-acetaminophen (NORCO) 10-325 MG tablet Take 1-2 tablets by mouth every 4 (four) hours as needed. (Patient not taking: Reported on 02/17/2018)   . [DISCONTINUED] oxyCODONE-acetaminophen (ROXICET) 5-325 MG tablet Take 1 tablet by mouth every 4 (four) hours as needed for severe pain. (Patient not taking: Reported on 02/17/2018)   . [DISCONTINUED] predniSONE (STERAPRED UNI-PAK 48 TAB) 5 MG (48) TBPK tablet Take pack as directed   . [DISCONTINUED] tiotropium (SPIRIVA HANDIHALER) 18 MCG inhalation capsule Place 1 capsule (18 mcg total) into inhaler and  inhale daily.    No facility-administered encounter medications on file as of 04/01/2018.     Allergies  Allergen Reactions  . Codeine Nausea And Vomiting and Nausea Only    Review of Systems  Constitutional: Negative for fever and malaise/fatigue.  Eyes: Negative.   Respiratory: Positive for shortness of breath and wheezing. Negative for cough.        Stable.  Cardiovascular: Negative for chest pain, palpitations, orthopnea, claudication and leg swelling.  Gastrointestinal: Negative.   Musculoskeletal: Positive for  back pain.  Neurological: Negative.   Endo/Heme/Allergies: Negative.   Psychiatric/Behavioral: Negative.     Objective:  BP 132/70 (BP Location: Right Arm, Patient Position: Sitting, Cuff Size: Normal)   Pulse (!) 108   Temp 98.4 F (36.9 C) (Oral)   Wt 214 lb (97.1 kg)   SpO2 91%   BMI 33.52 kg/m   Physical Exam  Constitutional: She is oriented to person, place, and time and well-developed, well-nourished, and in no distress.  HENT:  Head: Normocephalic and atraumatic.  Right Ear: External ear normal.  Left Ear: External ear normal.  Nose: Nose normal.  Eyes: Conjunctivae are normal.  Neck: No thyromegaly present.  Cardiovascular: Normal rate, regular rhythm and normal heart sounds.  Pulmonary/Chest: Effort normal and breath sounds normal.  Abdominal: Soft.  Musculoskeletal: She exhibits no edema.  Neurological: She is alert and oriented to person, place, and time. Gait normal. GCS score is 15.  Skin: Skin is warm and dry.  Psychiatric: Mood, memory, affect and judgment normal.    Assessment and Plan :  COPD Stable--followed by pumonary CHF/chronic Stable. HTN HLD Obesity  I have done the exam and reviewed the chart and it is accurate to the best of my knowledge. Dentist has been used and  any errors in dictation or transcription are unintentional. Julieanne Manson M.D. [facility]

## 2018-04-07 ENCOUNTER — Telehealth: Payer: Self-pay

## 2018-04-07 NOTE — Telephone Encounter (Signed)
Faxed letter of medical necessity to [employer] for oxygen concentrator.  dbs

## 2018-04-10 ENCOUNTER — Telehealth: Payer: Self-pay | Admitting: Family Medicine

## 2018-04-10 MED ORDER — AZITHROMYCIN 250 MG PO TABS: ORAL_TABLET | ORAL | 1 refills | Status: DC

## 2018-04-10 NOTE — Telephone Encounter (Signed)
Can we send this in for her 

## 2018-04-10 NOTE — Telephone Encounter (Signed)
zpak with 2 refills.

## 2018-04-10 NOTE — Telephone Encounter (Signed)
[facility] Pt stated that she started sneezing and coughing last night. Pt stated that the cough isn't that much and she stated no congestion. [facility]. Pt was advised that we don't treat over the phone and was offered an OV for this afternoon. Pt stated that Dr. Sullivan LoneGilbert always calls her in a Z pack and [facility]. Please advise. Thanks TNP

## 2018-04-13 ENCOUNTER — Emergency Department: Payer: Medicare Other

## 2018-04-13 ENCOUNTER — Inpatient Hospital Stay
Admission: EM | Admit: 2018-04-13 | Discharge: 2018-04-19 | DRG: 330 | Disposition: A | Discharge status: Home / Self Care | Payer: Medicare Other | Attending: Surgery | Admitting: Surgery

## 2018-04-13 ENCOUNTER — Other Ambulatory Visit: Payer: Self-pay

## 2018-04-13 DIAGNOSIS — J449 Chronic obstructive pulmonary disease, unspecified: Secondary | ICD-10-CM | POA: Diagnosis present

## 2018-04-13 DIAGNOSIS — K559 Vascular disorder of intestine, unspecified: Principal | ICD-10-CM | POA: Diagnosis present

## 2018-04-13 DIAGNOSIS — K567 Ileus, unspecified: Secondary | ICD-10-CM | POA: Diagnosis not present

## 2018-04-13 DIAGNOSIS — Z7982 Long term (current) use of aspirin: Secondary | ICD-10-CM

## 2018-04-13 DIAGNOSIS — K460 Unspecified abdominal hernia with obstruction, without gangrene: Secondary | ICD-10-CM | POA: Diagnosis not present

## 2018-04-13 DIAGNOSIS — M1990 Unspecified osteoarthritis, unspecified site: Secondary | ICD-10-CM | POA: Diagnosis present

## 2018-04-13 DIAGNOSIS — E1140 Type 2 diabetes mellitus with diabetic neuropathy, unspecified: Secondary | ICD-10-CM | POA: Diagnosis not present

## 2018-04-13 DIAGNOSIS — K436 Other and unspecified ventral hernia with obstruction, without gangrene: Secondary | ICD-10-CM | POA: Diagnosis not present

## 2018-04-13 DIAGNOSIS — Z79899 Other long term (current) drug therapy: Secondary | ICD-10-CM

## 2018-04-13 DIAGNOSIS — Z87891 Personal history of nicotine dependence: Secondary | ICD-10-CM

## 2018-04-13 DIAGNOSIS — Z79890 Hormone replacement therapy (postmenopausal): Secondary | ICD-10-CM

## 2018-04-13 DIAGNOSIS — Z9981 Dependence on supplemental oxygen: Secondary | ICD-10-CM

## 2018-04-13 DIAGNOSIS — K56609 Unspecified intestinal obstruction, unspecified as to partial versus complete obstruction: Secondary | ICD-10-CM | POA: Diagnosis not present

## 2018-04-13 DIAGNOSIS — R109 Unspecified abdominal pain: Secondary | ICD-10-CM | POA: Diagnosis not present

## 2018-04-13 DIAGNOSIS — Z9842 Cataract extraction status, left eye: Secondary | ICD-10-CM

## 2018-04-13 DIAGNOSIS — K219 Gastro-esophageal reflux disease without esophagitis: Secondary | ICD-10-CM | POA: Diagnosis present

## 2018-04-13 DIAGNOSIS — Z7984 Long term (current) use of oral hypoglycemic drugs: Secondary | ICD-10-CM | POA: Diagnosis not present

## 2018-04-13 DIAGNOSIS — G4730 Sleep apnea, unspecified: Secondary | ICD-10-CM | POA: Diagnosis present

## 2018-04-13 DIAGNOSIS — E039 Hypothyroidism, unspecified: Secondary | ICD-10-CM | POA: Diagnosis not present

## 2018-04-13 DIAGNOSIS — I10 Essential (primary) hypertension: Secondary | ICD-10-CM | POA: Diagnosis not present

## 2018-04-13 DIAGNOSIS — Z7951 Long term (current) use of inhaled steroids: Secondary | ICD-10-CM

## 2018-04-13 DIAGNOSIS — Z961 Presence of intraocular lens: Secondary | ICD-10-CM | POA: Diagnosis present

## 2018-04-13 DIAGNOSIS — Z90710 Acquired absence of both cervix and uterus: Secondary | ICD-10-CM

## 2018-04-13 DIAGNOSIS — K450 Other specified abdominal hernia with obstruction, without gangrene: Secondary | ICD-10-CM

## 2018-04-13 DIAGNOSIS — I509 Heart failure, unspecified: Secondary | ICD-10-CM | POA: Diagnosis not present

## 2018-04-13 DIAGNOSIS — R112 Nausea with vomiting, unspecified: Secondary | ICD-10-CM | POA: Diagnosis not present

## 2018-04-13 DIAGNOSIS — Z885 Allergy status to narcotic agent status: Secondary | ICD-10-CM

## 2018-04-13 DIAGNOSIS — E119 Type 2 diabetes mellitus without complications: Secondary | ICD-10-CM | POA: Diagnosis not present

## 2018-04-13 DIAGNOSIS — R1084 Generalized abdominal pain: Secondary | ICD-10-CM | POA: Diagnosis not present

## 2018-04-13 DIAGNOSIS — I110 Hypertensive heart disease with heart failure: Secondary | ICD-10-CM | POA: Diagnosis not present

## 2018-04-13 DIAGNOSIS — R1110 Vomiting, unspecified: Secondary | ICD-10-CM | POA: Diagnosis not present

## 2018-04-13 LAB — CBC
HCT: 33.8 % — ABNORMAL LOW (ref 35.0–47.0)
Hemoglobin: 11.1 g/dL — ABNORMAL LOW (ref 12.0–16.0)
MCH: 29.2 pg (ref 26.0–34.0)
MCHC: 32.7 g/dL (ref 32.0–36.0)
MCV: 89.3 fL (ref 80.0–100.0)
Platelets: 270 10*3/uL (ref 150–440)
RBC: 3.78 MIL/uL — ABNORMAL LOW (ref 3.80–5.20)
RDW: 15.4 % — ABNORMAL HIGH (ref 11.5–14.5)
WBC: 9.6 10*3/uL (ref 3.6–11.0)

## 2018-04-13 LAB — COMPREHENSIVE METABOLIC PANEL
ALT: 7 U/L — ABNORMAL LOW (ref 14–54)
AST: 18 U/L (ref 15–41)
Albumin: 3.7 g/dL (ref 3.5–5.0)
Alkaline Phosphatase: 67 U/L (ref 38–126)
Anion gap: 10 (ref 5–15)
BUN: 11 mg/dL (ref 6–20)
CO2: 32 mmol/L (ref 22–32)
Calcium: 9 mg/dL (ref 8.9–10.3)
Chloride: 97 mmol/L — ABNORMAL LOW (ref 101–111)
Creatinine, Ser: 0.76 mg/dL (ref 0.44–1.00)
GFR calc Af Amer: >60 mL/min (ref >60)
GFR calc non Af Amer: >60 mL/min (ref >60)
Glucose, Bld: 116 mg/dL — ABNORMAL HIGH (ref 65–99)
Potassium: 4.4 mmol/L (ref 3.5–5.1)
Sodium: 139 mmol/L (ref 135–145)
Total Bilirubin: 0.4 mg/dL (ref 0.3–1.2)
Total Protein: 6.7 g/dL (ref 6.5–8.1)

## 2018-04-13 LAB — LIPASE, BLOOD: Lipase: 28 U/L (ref 11–51)

## 2018-04-13 MED ORDER — MORPHINE SULFATE (PF) 4 MG/ML IV SOLN
INTRAVENOUS | Status: AC
  Administered 2018-04-13: 4 mg via INTRAVENOUS
  Filled 2018-04-13: qty 1

## 2018-04-13 MED ORDER — ONDANSETRON HCL 4 MG/2ML IJ SOLN
4.0000 mg | Freq: Once | INTRAMUSCULAR | Status: AC
  Administered 2018-04-13: 4 mg via INTRAVENOUS

## 2018-04-13 MED ORDER — ONDANSETRON 4 MG PO TBDP
4.0000 mg | ORAL_TABLET | Freq: Once | ORAL | Status: AC
  Administered 2018-04-13: 4 mg via ORAL
  Filled 2018-04-13: qty 1

## 2018-04-13 MED ORDER — ONDANSETRON HCL 4 MG/2ML IJ SOLN
INTRAMUSCULAR | Status: AC
  Administered 2018-04-13: 4 mg via INTRAVENOUS
  Filled 2018-04-13: qty 2

## 2018-04-13 MED ORDER — MORPHINE SULFATE (PF) 4 MG/ML IV SOLN
4.0000 mg | Freq: Once | INTRAVENOUS | Status: AC
  Administered 2018-04-13: 4 mg via INTRAVENOUS

## 2018-04-13 MED ORDER — IOHEXOL 300 MG/ML  SOLN
100.0000 mL | Freq: Once | INTRAMUSCULAR | Status: AC | PRN
  Administered 2018-04-13: 100 mL via INTRAVENOUS

## 2018-04-13 MED ORDER — FAMOTIDINE IN NACL 20-0.9 MG/50ML-% IV SOLN
20.0000 mg | Freq: Once | INTRAVENOUS | Status: AC
  Administered 2018-04-13: 20 mg via INTRAVENOUS
  Filled 2018-04-13: qty 50

## 2018-04-13 NOTE — ED Triage Notes (Signed)
Patient reports sinus like symptoms for the past week.  Today after eating [employer] Sheffield[employer] jangles this evening she has had abdominal pain, nausea and vomited x1.

## 2018-04-14 ENCOUNTER — Encounter
Admission: EM | Disposition: A | Discharge status: Home / Self Care | Payer: Self-pay | Source: Home / Self Care | Attending: Surgery

## 2018-04-14 ENCOUNTER — Emergency Department: Payer: Medicare Other

## 2018-04-14 ENCOUNTER — Emergency Department: Payer: Medicare Other | Admitting: Anesthesiology

## 2018-04-14 ENCOUNTER — Encounter: Payer: Self-pay | Admitting: *Deleted

## 2018-04-14 DIAGNOSIS — E039 Hypothyroidism, unspecified: Secondary | ICD-10-CM | POA: Diagnosis present

## 2018-04-14 DIAGNOSIS — E119 Type 2 diabetes mellitus without complications: Secondary | ICD-10-CM | POA: Diagnosis present

## 2018-04-14 DIAGNOSIS — K450 Other specified abdominal hernia with obstruction, without gangrene: Secondary | ICD-10-CM

## 2018-04-14 DIAGNOSIS — Z7984 Long term (current) use of oral hypoglycemic drugs: Secondary | ICD-10-CM | POA: Diagnosis not present

## 2018-04-14 DIAGNOSIS — J449 Chronic obstructive pulmonary disease, unspecified: Secondary | ICD-10-CM | POA: Diagnosis present

## 2018-04-14 DIAGNOSIS — K436 Other and unspecified ventral hernia with obstruction, without gangrene: Secondary | ICD-10-CM | POA: Diagnosis present

## 2018-04-14 DIAGNOSIS — I10 Essential (primary) hypertension: Secondary | ICD-10-CM | POA: Diagnosis present

## 2018-04-14 DIAGNOSIS — M1990 Unspecified osteoarthritis, unspecified site: Secondary | ICD-10-CM | POA: Diagnosis present

## 2018-04-14 DIAGNOSIS — Z79890 Hormone replacement therapy (postmenopausal): Secondary | ICD-10-CM | POA: Diagnosis not present

## 2018-04-14 DIAGNOSIS — K219 Gastro-esophageal reflux disease without esophagitis: Secondary | ICD-10-CM | POA: Diagnosis present

## 2018-04-14 DIAGNOSIS — G4730 Sleep apnea, unspecified: Secondary | ICD-10-CM | POA: Diagnosis present

## 2018-04-14 DIAGNOSIS — K559 Vascular disorder of intestine, unspecified: Secondary | ICD-10-CM | POA: Diagnosis present

## 2018-04-14 DIAGNOSIS — Z9842 Cataract extraction status, left eye: Secondary | ICD-10-CM | POA: Diagnosis not present

## 2018-04-14 DIAGNOSIS — Z9981 Dependence on supplemental oxygen: Secondary | ICD-10-CM | POA: Diagnosis not present

## 2018-04-14 DIAGNOSIS — Z79899 Other long term (current) drug therapy: Secondary | ICD-10-CM | POA: Diagnosis not present

## 2018-04-14 DIAGNOSIS — R1084 Generalized abdominal pain: Secondary | ICD-10-CM | POA: Diagnosis present

## 2018-04-14 DIAGNOSIS — Z885 Allergy status to narcotic agent status: Secondary | ICD-10-CM | POA: Diagnosis not present

## 2018-04-14 DIAGNOSIS — Z7951 Long term (current) use of inhaled steroids: Secondary | ICD-10-CM | POA: Diagnosis not present

## 2018-04-14 DIAGNOSIS — Z961 Presence of intraocular lens: Secondary | ICD-10-CM | POA: Diagnosis present

## 2018-04-14 DIAGNOSIS — K567 Ileus, unspecified: Secondary | ICD-10-CM | POA: Diagnosis not present

## 2018-04-14 DIAGNOSIS — Z7982 Long term (current) use of aspirin: Secondary | ICD-10-CM | POA: Diagnosis not present

## 2018-04-14 DIAGNOSIS — Z87891 Personal history of nicotine dependence: Secondary | ICD-10-CM | POA: Diagnosis not present

## 2018-04-14 DIAGNOSIS — Z90710 Acquired absence of both cervix and uterus: Secondary | ICD-10-CM | POA: Diagnosis not present

## 2018-04-14 DIAGNOSIS — K460 Unspecified abdominal hernia with obstruction, without gangrene: Secondary | ICD-10-CM | POA: Diagnosis present

## 2018-04-14 HISTORY — PX: VENTRAL HERNIA REPAIR: SHX424

## 2018-04-14 HISTORY — PX: BOWEL RESECTION: SHX1257

## 2018-04-14 LAB — GLUCOSE, CAPILLARY
Glucose-Capillary: 116 mg/dL — ABNORMAL HIGH (ref 65–99)
Glucose-Capillary: 133 mg/dL — ABNORMAL HIGH (ref 65–99)
Glucose-Capillary: 139 mg/dL — ABNORMAL HIGH (ref 65–99)
Glucose-Capillary: 155 mg/dL — ABNORMAL HIGH (ref 65–99)
Glucose-Capillary: 60 mg/dL — ABNORMAL LOW (ref 65–99)
Glucose-Capillary: 87 mg/dL (ref 65–99)

## 2018-04-14 LAB — COMPREHENSIVE METABOLIC PANEL
ALT: 7 U/L — ABNORMAL LOW (ref 14–54)
AST: 18 U/L (ref 15–41)
Albumin: 3.3 g/dL — ABNORMAL LOW (ref 3.5–5.0)
Alkaline Phosphatase: 62 U/L (ref 38–126)
Anion gap: 7 (ref 5–15)
BUN: 12 mg/dL (ref 6–20)
CO2: 30 mmol/L (ref 22–32)
Calcium: 8.2 mg/dL — ABNORMAL LOW (ref 8.9–10.3)
Chloride: 101 mmol/L (ref 101–111)
Creatinine, Ser: 0.76 mg/dL (ref 0.44–1.00)
GFR calc Af Amer: >60 mL/min (ref >60)
GFR calc non Af Amer: >60 mL/min (ref >60)
Glucose, Bld: 210 mg/dL — ABNORMAL HIGH (ref 65–99)
Potassium: 4.3 mmol/L (ref 3.5–5.1)
Sodium: 138 mmol/L (ref 135–145)
Total Bilirubin: 0.2 mg/dL — ABNORMAL LOW (ref 0.3–1.2)
Total Protein: 6.2 g/dL — ABNORMAL LOW (ref 6.5–8.1)

## 2018-04-14 LAB — CBC
HCT: 30.5 % — ABNORMAL LOW (ref 35.0–47.0)
Hemoglobin: 9.8 g/dL — ABNORMAL LOW (ref 12.0–16.0)
MCH: 28.7 pg (ref 26.0–34.0)
MCHC: 32.2 g/dL (ref 32.0–36.0)
MCV: 89.3 fL (ref 80.0–100.0)
Platelets: 320 10*3/uL (ref 150–440)
RBC: 3.41 MIL/uL — ABNORMAL LOW (ref 3.80–5.20)
RDW: 15.7 % — ABNORMAL HIGH (ref 11.5–14.5)
WBC: 21 10*3/uL — ABNORMAL HIGH (ref 3.6–11.0)

## 2018-04-14 SURGERY — REPAIR, HERNIA, VENTRAL
Anesthesia: General | Wound class: Contaminated

## 2018-04-14 MED ORDER — INSULIN ASPART 100 UNIT/ML ~~LOC~~ SOLN: 0.0000 [IU] | Freq: Every day | SUBCUTANEOUS | Status: DC

## 2018-04-14 MED ORDER — LEVOTHYROXINE SODIUM 125 MCG PO TABS
125.0000 ug | ORAL_TABLET | Freq: Every day | ORAL | Status: DC
  Administered 2018-04-14 – 2018-04-19 (×6): 125 ug via ORAL
  Filled 2018-04-14 (×7): qty 1

## 2018-04-14 MED ORDER — SODIUM CHLORIDE 0.9 % IR SOLN
Status: DC | PRN
  Administered 2018-04-14: 500 mL

## 2018-04-14 MED ORDER — FLUTICASONE PROPIONATE 50 MCG/ACT NA SUSP
1.0000 | Freq: Every day | NASAL | Status: DC
Start: 2018-04-14 — End: 2018-04-19
  Administered 2018-04-14 – 2018-04-19 (×6): 1 via NASAL
  Filled 2018-04-14: qty 16

## 2018-04-14 MED ORDER — OXYCODONE HCL 5 MG PO TABS
5.0000 mg | ORAL_TABLET | ORAL | Status: DC | PRN
  Administered 2018-04-18: 10 mg via ORAL
  Filled 2018-04-14: qty 2

## 2018-04-14 MED ORDER — GABAPENTIN 300 MG PO CAPS
600.0000 mg | ORAL_CAPSULE | Freq: Every morning | ORAL | Status: DC
  Administered 2018-04-14 – 2018-04-19 (×6): 600 mg via ORAL
  Filled 2018-04-14 (×6): qty 2

## 2018-04-14 MED ORDER — SODIUM CHLORIDE 0.9 % IV SOLN
2.0000 g | Freq: Two times a day (BID) | INTRAVENOUS | Status: AC
  Administered 2018-04-14 (×2): 2 g via INTRAVENOUS
  Filled 2018-04-14 (×2): qty 2

## 2018-04-14 MED ORDER — LACTATED RINGERS IV SOLN
INTRAVENOUS | Status: DC | PRN
  Administered 2018-04-14: 02:00:00 via INTRAVENOUS

## 2018-04-14 MED ORDER — FENTANYL CITRATE (PF) 100 MCG/2ML IJ SOLN
INTRAMUSCULAR | Status: AC
  Administered 2018-04-14: 25 ug via INTRAVENOUS
  Filled 2018-04-14: qty 2

## 2018-04-14 MED ORDER — ONDANSETRON 4 MG PO TBDP
4.0000 mg | ORAL_TABLET | Freq: Four times a day (QID) | ORAL | Status: DC | PRN
  Administered 2018-04-17: 4 mg via ORAL
  Filled 2018-04-14: qty 1

## 2018-04-14 MED ORDER — PHENYLEPHRINE HCL 10 MG/ML IJ SOLN
INTRAMUSCULAR | Status: DC | PRN
  Administered 2018-04-14 (×4): 100 ug via INTRAVENOUS

## 2018-04-14 MED ORDER — ENOXAPARIN SODIUM 40 MG/0.4ML ~~LOC~~ SOLN
40.0000 mg | SUBCUTANEOUS | Status: DC
  Administered 2018-04-15: 40 mg via SUBCUTANEOUS
  Filled 2018-04-14: qty 0.4

## 2018-04-14 MED ORDER — GABAPENTIN 300 MG PO CAPS
300.0000 mg | ORAL_CAPSULE | Freq: Every day | ORAL | Status: DC
  Administered 2018-04-14 – 2018-04-18 (×5): 300 mg via ORAL
  Filled 2018-04-14 (×5): qty 1

## 2018-04-14 MED ORDER — ONDANSETRON HCL 4 MG/2ML IJ SOLN
INTRAMUSCULAR | Status: AC
  Filled 2018-04-14: qty 2

## 2018-04-14 MED ORDER — LORAZEPAM 0.5 MG PO TABS: 0.5000 mg | ORAL_TABLET | Freq: Four times a day (QID) | ORAL | Status: DC | PRN

## 2018-04-14 MED ORDER — SUGAMMADEX SODIUM 200 MG/2ML IV SOLN
INTRAVENOUS | Status: DC | PRN
  Administered 2018-04-14: 194.2 mg via INTRAVENOUS

## 2018-04-14 MED ORDER — ARFORMOTEROL TARTRATE 15 MCG/2ML IN NEBU
15.0000 ug | INHALATION_SOLUTION | Freq: Two times a day (BID) | RESPIRATORY_TRACT | Status: DC
  Administered 2018-04-14 – 2018-04-19 (×11): 15 ug via RESPIRATORY_TRACT
  Filled 2018-04-14 (×12): qty 2

## 2018-04-14 MED ORDER — ONDANSETRON HCL 4 MG/2ML IJ SOLN
INTRAMUSCULAR | Status: DC | PRN
  Administered 2018-04-14: 4 mg via INTRAVENOUS

## 2018-04-14 MED ORDER — ACETAMINOPHEN 500 MG PO TABS
1000.0000 mg | ORAL_TABLET | Freq: Four times a day (QID) | ORAL | Status: DC
  Administered 2018-04-14 – 2018-04-19 (×18): 1000 mg via ORAL
  Filled 2018-04-14 (×19): qty 2

## 2018-04-14 MED ORDER — SODIUM CHLORIDE 0.9 % IV BOLUS
1000.0000 mL | Freq: Once | INTRAVENOUS | Status: AC
  Administered 2018-04-14: 1000 mL via INTRAVENOUS

## 2018-04-14 MED ORDER — HYDRALAZINE HCL 20 MG/ML IJ SOLN: 10.0000 mg | INTRAMUSCULAR | Status: DC | PRN

## 2018-04-14 MED ORDER — LIDOCAINE HCL (CARDIAC) PF 100 MG/5ML IV SOSY
PREFILLED_SYRINGE | INTRAVENOUS | Status: DC | PRN
  Administered 2018-04-14: 40 mg via INTRAVENOUS

## 2018-04-14 MED ORDER — ROCURONIUM BROMIDE 100 MG/10ML IV SOLN
INTRAVENOUS | Status: DC | PRN
  Administered 2018-04-14: 35 mg via INTRAVENOUS
  Administered 2018-04-14: 5 mg via INTRAVENOUS
  Administered 2018-04-14 (×2): 10 mg via INTRAVENOUS

## 2018-04-14 MED ORDER — PIPERACILLIN-TAZOBACTAM 3.375 G IVPB 30 MIN
3.3750 g | Freq: Once | INTRAVENOUS | Status: AC
  Administered 2018-04-14: 3.375 g via INTRAVENOUS
  Filled 2018-04-14: qty 50

## 2018-04-14 MED ORDER — FENTANYL CITRATE (PF) 100 MCG/2ML IJ SOLN
INTRAMUSCULAR | Status: DC | PRN
  Administered 2018-04-14: 25 ug via INTRAVENOUS
  Administered 2018-04-14: 50 ug via INTRAVENOUS
  Administered 2018-04-14: 25 ug via INTRAVENOUS

## 2018-04-14 MED ORDER — MOMETASONE FURO-FORMOTEROL FUM 100-5 MCG/ACT IN AERO
2.0000 | INHALATION_SPRAY | Freq: Two times a day (BID) | RESPIRATORY_TRACT | Status: DC
  Administered 2018-04-14 – 2018-04-19 (×11): 2 via RESPIRATORY_TRACT
  Filled 2018-04-14: qty 8.8

## 2018-04-14 MED ORDER — INSULIN ASPART 100 UNIT/ML ~~LOC~~ SOLN
4.0000 [IU] | Freq: Three times a day (TID) | SUBCUTANEOUS | Status: DC
  Administered 2018-04-14 – 2018-04-18 (×8): 4 [IU] via SUBCUTANEOUS
  Filled 2018-04-14 (×8): qty 1

## 2018-04-14 MED ORDER — LACTATED RINGERS IV SOLN
INTRAVENOUS | Status: DC
  Administered 2018-04-14 – 2018-04-18 (×10): via INTRAVENOUS

## 2018-04-14 MED ORDER — INSULIN ASPART 100 UNIT/ML ~~LOC~~ SOLN
0.0000 [IU] | Freq: Three times a day (TID) | SUBCUTANEOUS | Status: DC
  Administered 2018-04-14: 2 [IU] via SUBCUTANEOUS
  Administered 2018-04-14: 3 [IU] via SUBCUTANEOUS
  Administered 2018-04-15: 2 [IU] via SUBCUTANEOUS
  Filled 2018-04-14 (×3): qty 1

## 2018-04-14 MED ORDER — PANTOPRAZOLE SODIUM 40 MG PO TBEC
40.0000 mg | DELAYED_RELEASE_TABLET | Freq: Every day | ORAL | Status: DC
  Administered 2018-04-14 – 2018-04-19 (×6): 40 mg via ORAL
  Filled 2018-04-14 (×6): qty 1

## 2018-04-14 MED ORDER — IPRATROPIUM-ALBUTEROL 0.5-2.5 (3) MG/3ML IN SOLN: 3.0000 mL | Freq: Four times a day (QID) | RESPIRATORY_TRACT | Status: DC | PRN

## 2018-04-14 MED ORDER — MORPHINE SULFATE (PF) 2 MG/ML IV SOLN
2.0000 mg | INTRAVENOUS | Status: DC | PRN
  Administered 2018-04-16 – 2018-04-18 (×3): 2 mg via INTRAVENOUS
  Filled 2018-04-14 (×3): qty 1

## 2018-04-14 MED ORDER — DEXAMETHASONE SODIUM PHOSPHATE 10 MG/ML IJ SOLN
INTRAMUSCULAR | Status: DC | PRN
  Administered 2018-04-14: 8 mg via INTRAVENOUS

## 2018-04-14 MED ORDER — KETOROLAC TROMETHAMINE 15 MG/ML IJ SOLN
15.0000 mg | Freq: Four times a day (QID) | INTRAMUSCULAR | Status: DC
  Administered 2018-04-14 – 2018-04-15 (×5): 15 mg via INTRAVENOUS
  Filled 2018-04-14 (×5): qty 1

## 2018-04-14 MED ORDER — ACETAMINOPHEN 10 MG/ML IV SOLN
INTRAVENOUS | Status: DC | PRN
  Administered 2018-04-14: 1000 mg via INTRAVENOUS

## 2018-04-14 MED ORDER — GABAPENTIN 300 MG PO CAPS: 300.0000 mg | ORAL_CAPSULE | Freq: Three times a day (TID) | ORAL | Status: DC

## 2018-04-14 MED ORDER — GABAPENTIN 300 MG PO CAPS
600.0000 mg | ORAL_CAPSULE | Freq: Every day | ORAL | Status: DC
  Administered 2018-04-14 – 2018-04-18 (×5): 600 mg via ORAL
  Filled 2018-04-14 (×5): qty 2

## 2018-04-14 MED ORDER — MIDAZOLAM HCL 2 MG/2ML IJ SOLN
INTRAMUSCULAR | Status: DC | PRN
  Administered 2018-04-14: 1 mg via INTRAVENOUS

## 2018-04-14 MED ORDER — ARFORMOTEROL TARTRATE 15 MCG/2ML IN NEBU
15.0000 ug | INHALATION_SOLUTION | Freq: Two times a day (BID) | RESPIRATORY_TRACT | Status: DC
  Filled 2018-04-14: qty 2

## 2018-04-14 MED ORDER — ONDANSETRON HCL 4 MG/2ML IJ SOLN
INTRAMUSCULAR | Status: AC
  Administered 2018-04-14: 4 mg
  Filled 2018-04-14: qty 2

## 2018-04-14 MED ORDER — BUPIVACAINE-EPINEPHRINE (PF) 0.25% -1:200000 IJ SOLN
INTRAMUSCULAR | Status: DC | PRN
  Administered 2018-04-14: 30 mL via PERINEURAL

## 2018-04-14 MED ORDER — SUCCINYLCHOLINE CHLORIDE 20 MG/ML IJ SOLN
INTRAMUSCULAR | Status: DC | PRN
  Administered 2018-04-14: 100 mg via INTRAVENOUS

## 2018-04-14 MED ORDER — ONDANSETRON HCL 4 MG/2ML IJ SOLN: 4.0000 mg | Freq: Once | INTRAMUSCULAR | Status: DC | PRN

## 2018-04-14 MED ORDER — METFORMIN HCL 500 MG PO TABS
1000.0000 mg | ORAL_TABLET | Freq: Two times a day (BID) | ORAL | Status: DC
  Administered 2018-04-14 – 2018-04-15 (×3): 1000 mg via ORAL
  Filled 2018-04-14 (×3): qty 2

## 2018-04-14 MED ORDER — ROFLUMILAST 500 MCG PO TABS
500.0000 ug | ORAL_TABLET | Freq: Every day | ORAL | Status: DC
  Administered 2018-04-14 – 2018-04-19 (×6): 500 ug via ORAL
  Filled 2018-04-14 (×6): qty 1

## 2018-04-14 MED ORDER — PROPOFOL 10 MG/ML IV BOLUS
INTRAVENOUS | Status: DC | PRN
  Administered 2018-04-14: 110 mg via INTRAVENOUS

## 2018-04-14 MED ORDER — FENTANYL CITRATE (PF) 100 MCG/2ML IJ SOLN
25.0000 ug | INTRAMUSCULAR | Status: DC | PRN
  Administered 2018-04-14 (×2): 25 ug via INTRAVENOUS

## 2018-04-14 MED ORDER — ONDANSETRON HCL 4 MG/2ML IJ SOLN
4.0000 mg | Freq: Four times a day (QID) | INTRAMUSCULAR | Status: DC | PRN
  Administered 2018-04-14 – 2018-04-19 (×4): 4 mg via INTRAVENOUS
  Filled 2018-04-14 (×4): qty 2

## 2018-04-14 MED ORDER — LEVOTHYROXINE SODIUM 100 MCG PO TABS: 125.0000 ug | ORAL_TABLET | Freq: Every day | ORAL | Status: DC

## 2018-04-14 SURGICAL SUPPLY — 41 items
BLADE SURG 15 STRL LF DISP TIS (BLADE) ×1 IMPLANT
BLADE SURG 15 STRL SS (BLADE) ×2
BULB RESERV EVAC DRAIN JP 100C (MISCELLANEOUS) ×3 IMPLANT
CANISTER SUCT 1200ML W/VALVE (MISCELLANEOUS) ×3 IMPLANT
CANISTER SUCT 3000ML PPV (MISCELLANEOUS) IMPLANT
CHLORAPREP W/TINT 26ML (MISCELLANEOUS) ×6 IMPLANT
DRAIN CHANNEL JP 19F (MISCELLANEOUS) ×3 IMPLANT
DRAPE LAPAROTOMY 100X77 ABD (DRAPES) ×3 IMPLANT
DRSG OPSITE POSTOP 4X8 (GAUZE/BANDAGES/DRESSINGS) ×3 IMPLANT
DRSG TEGADERM 2-3/8X2-3/4 SM (GAUZE/BANDAGES/DRESSINGS) IMPLANT
DRSG TELFA 3X8 NADH (GAUZE/BANDAGES/DRESSINGS) ×3 IMPLANT
ELECT REM PT RETURN 9FT ADLT (ELECTROSURGICAL) ×3
ELECTRODE REM PT RTRN 9FT ADLT (ELECTROSURGICAL) ×1 IMPLANT
GAUZE SPONGE 4X4 12PLY STRL (GAUZE/BANDAGES/DRESSINGS) ×6 IMPLANT
GLOVE BIO SURGEON STRL SZ7 (GLOVE) ×3 IMPLANT
GOWN STRL REUS W/ TWL LRG LVL3 (GOWN DISPOSABLE) ×2 IMPLANT
GOWN STRL REUS W/TWL LRG LVL3 (GOWN DISPOSABLE) ×4
HANDLE YANKAUER SUCT BULB TIP (MISCELLANEOUS) ×3 IMPLANT
NEEDLE HYPO 22GX1.5 SAFETY (NEEDLE) ×6 IMPLANT
NEEDLE HYPO 25X1 1.5 SAFETY (NEEDLE) ×3 IMPLANT
PACK BASIN MAJOR ARMC (MISCELLANEOUS) ×3 IMPLANT
RELOAD PROXIMATE 75MM BLUE (ENDOMECHANICALS) ×9 IMPLANT
SPONGE LAP 18X18 5 PK (GAUZE/BANDAGES/DRESSINGS) ×3 IMPLANT
SPONGE LAP 18X18 RF (DISPOSABLE) ×3 IMPLANT
STAPLER PROXIMATE 75MM BLUE (STAPLE) ×3 IMPLANT
STAPLER SKIN PROX 35W (STAPLE) ×3 IMPLANT
SUT ETHIBOND 0 MO6 C/R (SUTURE) ×9 IMPLANT
SUT ETHIBOND NAB MO 7 #0 18IN (SUTURE) IMPLANT
SUT PDS AB 1 TP1 96 (SUTURE) IMPLANT
SUT SILK 2 0 (SUTURE) ×2
SUT SILK 2 0 SH CR/8 (SUTURE) IMPLANT
SUT SILK 2 0SH CR/8 30 (SUTURE) ×6 IMPLANT
SUT SILK 2-0 18XBRD TIE 12 (SUTURE) ×1 IMPLANT
SUT VIC AB 0 CT1 36 (SUTURE) ×3 IMPLANT
SUT VIC AB 2-0 SH 27 (SUTURE) ×2
SUT VIC AB 2-0 SH 27XBRD (SUTURE) ×1 IMPLANT
SYR 10ML LL (SYRINGE) ×3 IMPLANT
SYR 20CC LL (SYRINGE) ×3 IMPLANT
SYR 30ML LL (SYRINGE) ×6 IMPLANT
SYR 3ML LL SCALE MARK (SYRINGE) ×3 IMPLANT
TAPE MICROFOAM 4IN (TAPE) IMPLANT

## 2018-04-14 NOTE — Progress Notes (Signed)
Seen and examined.  Discussed operative findings.  No postoperative issues at this time

## 2018-04-14 NOTE — Op Note (Signed)
PROCEDURES: 1. Ventral hernia Repair (primary) 2. Partial omentectomy 3. Small bowel resection  Pre-operative Diagnosis: Incarcerated Hernia w SBO  Post-operative Diagnosis: Same  Surgeon: Merri Rayiego F Pabon    Anesthesia: General endotracheal anesthesia  ASA Class: 3   Surgeon: Sterling Bigiego Pabon , MD FACS  Anesthesia: Gen. with endotracheal tube  Findings: Incarcerated ventral hernia with  small bowel ischemia, 25 cm segment Large ventral hernia 12 cm defect   Estimated Blood Loss: 100cc         Drains: 19 blake drain         Specimens: SB and omentum       Complications: none                Procedure Details  The patient was seen again in the Holding Room. The benefits, complications, treatment options, and expected outcomes were discussed with the patient. The risks of bleeding, infection, recurrence of symptoms, failure to resolve symptoms,  bowel injury, any of which could require further surgery were reviewed with the patient.   The patient was taken to Operating Room, identified as Kristen Schroeder and the procedure verified.  A Time Out was held and the above information confirmed.  Prior to the induction of general anesthesia, antibiotic prophylaxis was administered. VTE prophylaxis was in place. General endotracheal anesthesia was then administered and tolerated well. After the induction, the abdomen was prepped with Chloraprep and draped in the sterile fashion. The patient was positioned in the supine position.  Transvenous incision was created over the hernia defect.  Electrocautery was used to dissect through subtenons tissue down to the neck of the hernia.  Very careful dissection was performed in order to avoid any potential bowel injury.  Using Metzenbaum scissors were able to open the sac and excised the sac.  Upon entering the sac we visualize a piece of small bowel that was ischemic.  I had to incise part of the fascia both cephalad and caudally to allow adequate  mobilization of the small bowel and an adequate resection.  We created proximal distal mesenteric windows using the electrocautery and with a 75 GIA stapler performed a small bowel resection, the mesentery was clamped and divided using 2-0 silk sutures in the standard fashion..  Anastomosis was created in a side-to-side functional end-to-end staple anastomosis in the standard fashion. There was no evidence of intraoperative leak or tension at the  Anastomosis. I was pushed back into the abdominal cavity.  There was no evidence of any further ischemia segments.  The anastomosis was well-perfused.  Were able to clean out the edges of the fascia and the defect measured approximately 12 cm using interrupted 0 Ethibond sutures the defect was closed in interrupted fashion.  There was significant subcutaneous dead space due to the chronicity of the ventral hernia.  For this reason I place [id number] Blake drain into the subcutaneous tissue and attached it to the skin using a 2 0.  Marcaine quarter percent with epi was injected in the fascia and subtenons tissue.  The skin was closed with staples after irrigation with normal saline of the subcu.   Needle and laparotomy count were correct and there were no immediate complications.  Sterling Bigiego Pabon, MD, FACS

## 2018-04-14 NOTE — Transfer of Care (Signed)
Immediate Anesthesia Transfer of Care Note  Patient: Kristen Schroeder  Procedure(s) Performed: HERNIA REPAIR VENTRAL ADULT (N/A ) SMALL BOWEL RESECTION (N/A )  Patient Location:  PACU   Anesthesia Type:General  Level of Consciousness: awake  Airway & Oxygen Therapy: Patient Spontanous Breathing, Patient connected to nasal cannula oxygen and Patient connected to face mask oxygen  Post-op Assessment: Report given to RN and Post -op Vital signs reviewed and stable  Post vital signs: Reviewed and stable  Last Vitals:  Vitals Value Taken Time  BP 159/73 04/14/2018  4:05 AM  Temp 36.3 C 04/14/2018  4:04 AM  Pulse 86 04/14/2018  4:14 AM  Resp 24 04/14/2018  4:14 AM  SpO2 94 % 04/14/2018  4:14 AM  Vitals shown include unvalidated device data.  Last Pain:  Vitals:   04/14/18 0404  TempSrc:   PainSc: 0-No pain         Complications: No apparent anesthesia complications

## 2018-04-14 NOTE — Anesthesia Postprocedure Evaluation (Signed)
Anesthesia Post Note  Patient: Kristen Schroeder  Procedure(s) Performed: HERNIA REPAIR VENTRAL ADULT (N/A ) SMALL BOWEL RESECTION (N/A )  Patient location during evaluation: PACU Anesthesia Type: General Level of consciousness: awake and alert Pain management: pain level controlled Vital Signs Assessment: post-procedure vital signs reviewed and stable Respiratory status: spontaneous breathing and respiratory function stable Cardiovascular status: stable Anesthetic complications: no     Last Vitals:  Vitals:   04/14/18 0611 04/14/18 0726  BP: (!) 124/57 (!) 105/52  Pulse: 74 87  Resp: 16 18  Temp: (!) 36.3 C (!) 36.3 C  SpO2: 95% 98%    Last Pain:  Vitals:   04/14/18 0726  TempSrc: Oral  PainSc:                  KEPHART,WILLIAM K

## 2018-04-14 NOTE — ED Provider Notes (Addendum)
[facility] Emergency Department Provider Note  ____________________________________________  Time seen: Approximately 12:24 AM  I have reviewed the triage vital signs and the nursing notes.   HISTORY  Chief Complaint Abdominal Pain    HPI Kristen Schroeder is a 77 y.o. female with a history of COPD diabetes and arthritis reports abdominal pain and vomiting that started today after eating at [employer].  Her husband reports that he thought the chicken was suspect.  Patient had one episode of vomiting.  Abdominal pain was generalized nonradiating without aggravating or alleviating factors.  Continuous and moderate intensity.      Past Medical History:  Diagnosis Date  . Arthritis   . COPD (chronic obstructive pulmonary disease) (HCC)   . Diabetes mellitus without complication (HCC)   . Dysrhythmia   . Heart murmur   . History of orthopnea   . Hypertension   . Hypothyroidism   . Neuropathy   . Oxygen dependent    3L  CONTINUOUS  . Pain CHRONIC BACK PAIN  . Shortness of breath dyspnea   . Wheezing      Patient Active Problem List   Diagnosis Date Noted  . Aneurysm (HCC) 07/10/2016  . Nonspecific abnormal finding 05/11/2015  . Allergic rhinitis 05/11/2015  . Anxiety 05/11/2015  . Bronchitis, chronic (HCC) 05/11/2015  . CCF (congestive cardiac failure) (HCC) 05/11/2015  . Clinical depression 05/11/2015  . 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 Essential (primary) hypertension 05/11/2015  . Acid reflux 05/11/2015  . HLD (hyperlipidemia) 05/11/2015  . Adult hypothyroidism 05/11/2015  . Cervical dysplasia, mild 05/11/2015  . Neuropathy 05/11/2015  . Adiposity 05/11/2015  . Obstructive apnea 05/11/2015  . Arthritis, degenerative 05/11/2015     Past Surgical History:  Procedure Laterality Date  . ABDOMINAL HYSTERECTOMY    . CATARACT EXTRACTION W/PHACO Left 07/03/2016   Procedure: CATARACT EXTRACTION PHACO AND INTRAOCULAR  LENS PLACEMENT (IOC);  Surgeon: Galen Manila, MD;  Location: [facility] ORS;  Service: Ophthalmology;  Laterality: Left;  Lot: [id number] H [place]: 00:40.1 AP%: 17.4 CDE:6.94  . TUBAL LIGATION       Prior to Admission medications   Medication Sig Start Date End Date Taking? Authorizing Provider  arformoterol (BROVANA) 15 MCG/2ML NEBU Take 2 mLs (15 mcg total) by nebulization 2 (two) times daily. 03/04/18   Maple Hudson., MD  aspirin EC 81 MG tablet Take 81 mg by mouth daily.    [provider]  azithromycin (ZITHROMAX) 250 MG tablet Take 2 day 1 and then 1 daily 04/10/18   Maple Hudson., MD  DALIRESP 500 MCG TABS tablet Take 500 mcg by mouth daily.     [provider]  fluticasone (FLONASE) 50 MCG/ACT nasal spray Place 1 spray into both nostrils daily as needed.     [provider]  furosemide (LASIX) 20 MG tablet TAKE 1 TABLET BY MOUTH DAILY 02/10/18   Maple Hudson., MD  gabapentin (NEURONTIN) 300 MG capsule 2 tablets in the morning, 1 tablet in the afternoon and 2 tablets in the evening 12/04/17   Maple Hudson., MD  ipratropium-albuterol (DUONEB) 0.5-2.5 (3) MG/3ML SOLN Take 3 mLs by nebulization every 6 (six) hours as needed. 03/20/18   Lyndon Code, MD  isosorbide mononitrate (IMDUR) 30 MG 24 hr tablet TAKE 1 TABLET (30 MG TOTAL) BY MOUTH ONCE DAILY. 06/13/15   [provider]  levothyroxine (SYNTHROID, LEVOTHROID) 125 MCG tablet TAKE 1 TABLET BY MOUTH DAILY 05/20/17  Maple Hudson., MD  lisinopril (PRINIVIL,ZESTRIL) 20 MG tablet Take 1 tablet (20 mg total) by mouth daily. 08/26/17   Maple Hudson., MD  loratadine (CLARITIN) 10 MG tablet TAKE 1 TABLET (10 MG TOTAL) BY MOUTH DAILY. 03/22/17   Maple Hudson., MD  LORazepam (ATIVAN) 0.5 MG tablet TAKE 1 TABLET BY MOUTH 2 TIMES DAILY AS NEEDED FOR ANXIETY 01/29/18   Maple Hudson., MD  metFORMIN (GLUCOPHAGE) 1000 MG tablet TAKE 1 TABLET BY MOUTH TWICE A  DAY Patient taking differently: TAKE 1 TABLET BY MOUTH ONCE A DAY 07/12/17   Maple Hudson., MD  mometasone-formoterol [facility]) 100-5 MCG/ACT AERO Inhale 2 puffs into the lungs 2 (two) times daily. 03/20/18   Lyndon Code, MD  MULTIPLE VITAMIN PO Take 1 tablet by mouth daily.     [provider]  omeprazole (PRILOSEC) 40 MG capsule Take 1 capsule (40 mg total) by mouth daily. 02/13/17   Maple Hudson., MD  ondansetron (ZOFRAN-ODT) 4 MG disintegrating tablet TAKE 1 TABLET BY MOUTH EVERY 8 HOURS AS NEEDED FOR NAUSEA AND VOMITING 06/13/17   Margaretann Loveless, PA-C  ONE TOUCH ULTRA TEST test strip USE 1 STRIP 2 TIMES DAILY AND AS NEEDED 06/28/16   Maple Hudson., MD  PARoxetine (PAXIL) 10 MG tablet Take 1 tablet (10 mg total) at bedtime by mouth. 09/13/17   Maple Hudson., MD  pravastatin (PRAVACHOL) 10 MG tablet TAKE 1 TABLET (10 MG TOTAL) BY MOUTH DAILY. 05/20/17   Maple Hudson., MD  salmeterol (SEREVENT) 50 MCG/DOSE diskus inhaler Inhale 1 puff into the lungs daily. 03/11/18   Maple Hudson., MD     Allergies Codeine   Family History  Problem Relation Age of Onset  . Heart disease Mother   . Drug abuse Other   . Hypertension Other     Social History Social History   Tobacco Use  . Smoking status: Former Smoker    Packs/day: 0.50    Years: 15.00    Pack years: 7.50  . Smokeless tobacco: Never Used  . Tobacco comment: 30-40 years ago  Substance Use Topics  . Alcohol use: No    Alcohol/week: 0.0 oz  . Drug use: No    Review of Systems  Constitutional:   No fever or chills.  ENT:   Positive sinus congestion for the past week Cardiovascular:   No chest pain or syncope. Respiratory:   No dyspnea or cough. Gastrointestinal:   Positive as above for abdominal pain and vomiting.   Musculoskeletal:   Negative for focal pain or swelling All other systems reviewed and are negative except as documented above in ROS and  HPI.  ____________________________________________   PHYSICAL EXAM:  VITAL SIGNS: ED Triage Vitals  Enc Vitals Group     BP 04/13/18 2020 (!) 158/67     Pulse Rate 04/13/18 2020 86     Resp 04/13/18 2020 20     Temp 04/13/18 2020 98.3 F (36.8 C)     Temp Source 04/13/18 2020 Oral     SpO2 04/13/18 2020 92 %     Weight --      Height 04/13/18 2019 5\' 5"  (1.651 m)     Head Circumference --      Peak Flow --      Pain Score 04/13/18 2019 10     Pain Loc --      Pain Edu? --  Excl. in GC? --     Vital signs reviewed, nursing assessments reviewed.   Constitutional:   Alert and oriented. Non-toxic appearance. Eyes:   Conjunctivae are normal. EOMI. PERRL. ENT      Head:   Normocephalic and atraumatic.      Nose:   No congestion/rhinnorhea.       Mouth/Throat:   MMM, no pharyngeal erythema. No peritonsillar mass.       Neck:   No meningismus. Full ROM. Hematological/Lymphatic/Immunilogical:   No cervical lymphadenopathy. Cardiovascular:   RRR. Symmetric bilateral radial and DP pulses.  No murmurs.  Respiratory:   Normal respiratory effort without tachypnea/retractions. Breath sounds are clear and equal bilaterally. No wheezes/rales/rhonchi. Gastrointestinal:   Soft with mild diffuse tenderness. Non distended. There is no CVA tenderness.  No rebound, rigidity, or guarding.  There is a moderate sized hernia in the left lower quadrant, firm.  Not significantly tender.  Unable to reduce with manual manipulation  Musculoskeletal:   Normal range of motion in all extremities. No joint effusions.  No lower extremity tenderness.  No edema. Neurologic:   Normal speech and language.  Motor grossly intact. No acute focal neurologic deficits are appreciated.  Skin:    Skin is warm, dry and intact. No rash noted.  No petechiae, purpura, or bullae.  ____________________________________________    LABS (pertinent positives/negatives) (all labs ordered are listed, but only abnormal  results are displayed) Labs Reviewed  COMPREHENSIVE METABOLIC PANEL - Abnormal; Notable for the following components:      Result Value   Chloride 97 (*)    Glucose, Bld 116 (*)    ALT 7 (*)    All other components within normal limits  CBC - Abnormal; Notable for the following components:   RBC 3.78 (*)    Hemoglobin 11.1 (*)    HCT 33.8 (*)    RDW 15.4 (*)    All other components within normal limits  LIPASE, BLOOD  URINALYSIS, COMPLETE (UACMP) WITH MICROSCOPIC   ____________________________________________   EKG Interpreted by me  Date: 04/29/2018  Rate: 81  Rhythm: normal sinus rhythm  QRS Axis: normal  Intervals: normal  ST/T Wave abnormalities: normal  Conduction Disutrbances: none  Narrative Interpretation: unremarkable      ____________________________________________    RADIOLOGY  Ct Abdomen Pelvis W Contrast  Result Date: 04/13/2018 CLINICAL DATA:  Abdominal pain, nausea and vomiting x1. EXAM: CT ABDOMEN AND PELVIS WITH CONTRAST TECHNIQUE: Multidetector CT imaging of the abdomen and pelvis was performed using the standard protocol following bolus administration of intravenous contrast. CONTRAST:  OMNIPAQUE IOHEXOL 300 MG/ML  SOLN COMPARISON:  06/14/2017 FINDINGS: Lower chest: Bibasilar cylindrical bronchiectasis. Scar linear scarring or atelectasis in the left lower lobe. Partially imaged subpleural 5 mm density, stable in the left lower lobe. Hepatobiliary: Punctate calcified hepatic granulomata. No enhancing space-occupying mass of the liver. No biliary dilatation. 2 cm nonobstructing gallstone noted within the nondistended gallbladder. No secondary signs of acute cholecystitis. Pancreas: Normal Spleen: Normal Adrenals/Urinary Tract: Normal bilateral adrenal glands. 11 mm cyst in the upper pole the right kidney. No obstructive uropathy. Repeat delayed imaging demonstrates symmetric pyelograms bilaterally. The urinary bladder is unremarkable for the degree of  distention. Stomach/Bowel: Early or partial SBO secondary to herniation of a short segment of small intestine within pre-existing left lower quadrant ventral hernia. Dilated fluid-filled small bowel leading up to the hernia is noted measuring up to 3.9 cm. The small intestine is decompressed exiting and distal to the hernia. The  mouth of the hernia is approximately 2 cm transverse. Pre-existing herniation of omental fat and fluid as before within this hernia is redemonstrated. Small hiatal hernia. The stomach is unremarkable. No acute abnormality of the colon. There is colonic diverticulosis along the descending sigmoid colon. Appendix is not well visualized but no pericecal inflammation is seen. Vascular/Lymphatic: Aortic atherosclerosis. No enlarged abdominal or pelvic lymph nodes. Reproductive: Status post hysterectomy. No adnexal masses. Other: Trace free fluid in the pelvis.  No free air. Musculoskeletal: Small fat containing umbilical hernia. Sacroiliac joint osteoarthritis. Redemonstration of lumbar spondylosis. Severe canal stenosis at L4-5. IMPRESSION: 1. Early or partial SBO secondary to a left lower quadrant ventral fat, fluid and small bowel containing hernia with the mouth of the hernia measuring 1.9 cm transverse. Herniation of small bowel is new since prior exam. Incarceration of the segment of herniated small bowel is not excluded. 2. Stable 11 mm cyst in the upper pole the right kidney. 3. Bibasilar cylindrical bronchiectasis. Stable partially included 5 mm subpleural density in the left lower lobe versus 7 mm previously. Given stability, future CT at 18-24 months (from 06/14/2017 exam) is considered optional for low-risk patients, but is recommended for high-risk patients. This recommendation follows the consensus statement: Guidelines for Management of Incidental Pulmonary Nodules Detected on CT Images: From the Fleischner Society 2017; Radiology 2017; 284:228-243. 4. Lumbar spondylosis.  Electronically Signed   By: Tollie Eth M.D.   On: 04/13/2018 22:44    ____________________________________________   PROCEDURES .Critical Care Performed by: Sharman Cheek, MD Authorized by: Sharman Cheek, MD   Critical care provider statement:    Critical care time (minutes):  30   Critical care time was exclusive of:  Separately billable procedures and treating other patients   Critical care was time spent personally by me on the following activities:  Development of treatment plan with patient or surrogate, discussions with consultants, evaluation of patient's response to treatment, examination of patient, obtaining history from patient or surrogate, ordering and performing treatments and interventions, ordering and review of laboratory studies, ordering and review of radiographic studies, pulse oximetry, re-evaluation of patient's condition and review of old charts    ____________________________________________  DIFFERENTIAL DIAGNOSIS   Small bowel obstruction, pancreatitis, biliary disease, bowel perforation, diverticulitis, appendicitis  CLINICAL IMPRESSION / ASSESSMENT AND PLAN / ED COURSE  Pertinent labs & imaging results that were available during my care of the patient were reviewed by me and considered in my medical decision making (see chart for details).    Patient is nontoxic, vital signs are unremarkable but presents with abdominal pain and vomiting after eating fried chicken.  Due to her age and comorbidities including diabetes and the large hernia on exam, I obtained a CT scan.  This does confirm a likely incarcerated hernia with evidence of early small bowel obstruction physiology.  I again attempted to reduce the hernia at about 12:00 AM on now June 10, was unable to do so.  Discussed the case with surgery who will evaluate in ED and plans to likely proceed with emergency surgery for hernia repair and bowel salvage possible bowel resection.  I will give IV  Zosyn, check chest x-ray and EKG.  Continue IV fluids..      ____________________________________________   FINAL CLINICAL IMPRESSION(S) / ED DIAGNOSES    Final diagnoses:  Generalized abdominal pain  Incarcerated hernia of abdominal cavity     ED Discharge Orders    None      Portions of this note were  generated with Scientist, clinical (histocompatibility and immunogenetics)dragon dictation software. Dictation errors may occur despite best attempts at proofreading.    Sharman CheekStafford, Heiley Shaikh, MD 04/14/18 [id number]    Sharman CheekStafford, Onelia Cadmus, MD 04/14/18 0040    Sharman CheekStafford, Mame Twombly, MD 04/29/18 [phone]

## 2018-04-14 NOTE — H&P (Signed)
Patient ID: Kristen KaufmanRuthie L Wisdom, female   DOB: Mar 18, 1941, 77 y.o.   MRN: [id number]  HPI Kristen Schroeder is a 77 y.o. female seen in the emergency room for sudden obstinate of abdominal pain.  She reports that her pain is moderate intensity and apparently started today after eating a [employer].  Her last meal was around 5 PM.  She reports nausea vomiting as well.  She reports that the pain is moderate to severe and constant.  Pain is worsening with certain movements and Valsalva.  She also has decreased appetite and nausea.  No fevers no chills.  She had a previous hysterectomy and tubal ligation in the past. sHe also feels a bulge. CT scan personally reviewed there is an obvious incarcerated ventral hernia on the left abdominal wall.  This is causing at least a partial obstruction.  No evidence of bowel ischemia or free air. Her laboratory values includes a CBC and a CMP that are completely normal.  She does wear oxygen at night because of her COPD. However she is independent and reasonably functional.  Her mentation is intact.  HPI  Past Medical History:  Diagnosis Date  . Arthritis   . COPD (chronic obstructive pulmonary disease) (HCC)   . Diabetes mellitus without complication (HCC)   . Dysrhythmia   . Heart murmur   . History of orthopnea   . Hypertension   . Hypothyroidism   . Neuropathy   . Oxygen dependent    3L  CONTINUOUS  . Pain CHRONIC BACK PAIN  . Shortness of breath dyspnea   . Wheezing     Past Surgical History:  Procedure Laterality Date  . ABDOMINAL HYSTERECTOMY    . CATARACT EXTRACTION W/PHACO Left 07/03/2016   Procedure: CATARACT EXTRACTION PHACO AND INTRAOCULAR LENS PLACEMENT (IOC);  Surgeon: Galen ManilaWilliam Porfilio, MD;  Location: [facility] ORS;  Service: Ophthalmology;  Laterality: Left;  Lot: [id number] H [place]: 00:40.1 AP%: 17.4 CDE:6.94  . TUBAL LIGATION      Family History  Problem Relation Age of Onset  . Heart disease Mother   . Drug abuse Other   . Hypertension Other      Social History Social History   Tobacco Use  . Smoking status: Former Smoker    Packs/day: 0.50    Years: 15.00    Pack years: 7.50  . Smokeless tobacco: Never Used  . Tobacco comment: 30-40 years ago  Substance Use Topics  . Alcohol use: No    Alcohol/week: 0.0 oz  . Drug use: No    Allergies  Allergen Reactions  . Codeine Nausea And Vomiting and Nausea Only    Current Facility-Administered Medications  Medication Dose Route Frequency Provider Last Rate Last Dose  . piperacillin-tazobactam (ZOSYN) IVPB 3.375 g  3.375 g Intravenous Once Sharman CheekStafford, Phillip, MD 100 mL/hr at 04/14/18 0057 3.375 g at 04/14/18 0057   Current Outpatient Medications  Medication Sig Dispense Refill  . arformoterol (BROVANA) 15 MCG/2ML NEBU Take 2 mLs (15 mcg total) by nebulization 2 (two) times daily. 120 mL 11  . aspirin EC 81 MG tablet Take 81 mg by mouth daily.    Marland Kitchen. azithromycin (ZITHROMAX) 250 MG tablet Take 2 day 1 and then 1 daily 6 tablet 1  . DALIRESP 500 MCG TABS tablet Take 500 mcg by mouth daily.     . fluticasone (FLONASE) 50 MCG/ACT nasal spray Place 1 spray into both nostrils daily as needed.     . furosemide (LASIX) 20 MG tablet TAKE 1 TABLET  BY MOUTH DAILY 90 tablet 3  . gabapentin (NEURONTIN) 300 MG capsule 2 tablets in the morning, 1 tablet in the afternoon and 2 tablets in the evening 450 capsule 3  . ipratropium-albuterol (DUONEB) 0.5-2.5 (3) MG/3ML SOLN Take 3 mLs by nebulization every 6 (six) hours as needed. 360 mL 1  . isosorbide mononitrate (IMDUR) 30 MG 24 hr tablet TAKE 1 TABLET (30 MG TOTAL) BY MOUTH ONCE DAILY.    Marland Kitchen levothyroxine (SYNTHROID, LEVOTHROID) 125 MCG tablet TAKE 1 TABLET BY MOUTH DAILY 90 tablet 3  . lisinopril (PRINIVIL,ZESTRIL) 20 MG tablet Take 1 tablet (20 mg total) by mouth daily. 90 tablet 3  . loratadine (CLARITIN) 10 MG tablet TAKE 1 TABLET (10 MG TOTAL) BY MOUTH DAILY. 90 tablet 3  . LORazepam (ATIVAN) 0.5 MG tablet TAKE 1 TABLET BY MOUTH 2 TIMES  DAILY AS NEEDED FOR ANXIETY 60 tablet 4  . metFORMIN (GLUCOPHAGE) 1000 MG tablet TAKE 1 TABLET BY MOUTH TWICE A DAY (Patient taking differently: TAKE 1 TABLET BY MOUTH ONCE A DAY) 180 tablet 3  . mometasone-formoterol (DULERA) 100-5 MCG/ACT AERO Inhale 2 puffs into the lungs 2 (two) times daily. 6 Inhaler 3  . MULTIPLE VITAMIN PO Take 1 tablet by mouth daily.     Marland Kitchen omeprazole (PRILOSEC) 40 MG capsule Take 1 capsule (40 mg total) by mouth daily. 90 capsule 1  . ondansetron (ZOFRAN-ODT) 4 MG disintegrating tablet TAKE 1 TABLET BY MOUTH EVERY 8 HOURS AS NEEDED FOR NAUSEA AND VOMITING 20 tablet 3  . ONE TOUCH ULTRA TEST test strip USE 1 STRIP 2 TIMES DAILY AND AS NEEDED 100 each 12  . PARoxetine (PAXIL) 10 MG tablet Take 1 tablet (10 mg total) at bedtime by mouth. 90 tablet 3  . pravastatin (PRAVACHOL) 10 MG tablet TAKE 1 TABLET (10 MG TOTAL) BY MOUTH DAILY. 90 tablet 3  . salmeterol (SEREVENT) 50 MCG/DOSE diskus inhaler Inhale 1 puff into the lungs daily. 1 Inhaler 12     Review of Systems Full ROS  was asked and was negative except for the information on the HPI  Physical Exam Blood pressure (!) 131/104, pulse 74, temperature 98.3 F (36.8 C), temperature source Oral, resp. rate 20, height 5\' 5"  (1.651 m), SpO2 100 %. CONSTITUTIONAL: NAD, alert EYES: Pupils are equal, round, and reactive to light, Sclera are non-icteric. EARS, NOSE, MOUTH AND THROAT: The oropharynx is clear. The oral mucosa is pink and moist. Hearing is intact to voice. LYMPH NODES:  Lymph nodes in the neck are normal. RESPIRATORY:  Lungs are clear. There is normal respiratory effort, with equal breath sounds bilaterally, and without pathologic use of accessory muscles. CARDIOVASCULAR: Heart is regular without murmurs, gallops, or rubs. GI: The abdomen is soft, incarcerated inguinal hernia to the left of the abdominal wall.  Some local peritonitis.   I am unable to reduce it. GU: Rectal deferred.   MUSCULOSKELETAL: Normal  muscle strength and tone. No cyanosis or edema.   SKIN: Turgor is good and there are no pathologic skin lesions or ulcers. NEUROLOGIC: Motor and sensation is grossly normal. Cranial nerves are grossly intact. PSYCH:  Oriented to person, place and time. Affect is normal.  Data Reviewed  I have personally reviewed the patient's imaging, laboratory findings and medical records.    Assessment/Plan 77 year old female presented with an acute abdomen and incarcerated ventral hernia.  I am concerned that this can be calm strangulated and is causing a bowel obstruction.  I have discussed with the patient  detail about her disease process.  Given all her findings I do recommend emergent repair and we talked about the possibility of small bowel resection as well. Discussed with the patient detail.  Risk benefit and possible complications including but not limited to: Bleeding, infection, recurrence, injury to the internal organs.  Pulmonary complications and anesthesia complications. She understands and wishes to proceed.  Counseling provided to the family as well. Coverage discussed with the OR team and will perform surgery tonight.  Sterling Big, MD FACS General Surgeon 04/14/2018, 1:23 AM

## 2018-04-14 NOTE — Anesthesia Preprocedure Evaluation (Addendum)
Anesthesia Evaluation  Patient identified by MRN, date of birth, ID band Patient awake    Reviewed: Allergy & Precautions, NPO status , Patient's Chart, lab work & pertinent test results  History of Anesthesia Complications Negative for: history of anesthetic complications  Airway Mallampati: III       Dental  (+) Edentulous Upper, Missing, Chipped   Pulmonary sleep apnea , COPD, former smoker,           Cardiovascular hypertension, +CHF  + dysrhythmias + Valvular Problems/Murmurs      Neuro/Psych    GI/Hepatic GERD  Medicated,  Endo/Other  diabetes, Type 2, Oral Hypoglycemic AgentsHypothyroidism   Renal/GU negative Renal ROS     Musculoskeletal   Abdominal   Peds  Hematology   Anesthesia Other Findings   Reproductive/Obstetrics                             Anesthesia Physical Anesthesia Plan  ASA: III and emergent  Anesthesia Plan: General   Post-op Pain Management:    Induction:   PONV Risk Score and Plan: 3 and Ondansetron, Dexamethasone and Treatment may vary due to age or medical condition  Airway Management Planned: Oral ETT  Additional Equipment:   Intra-op Plan:   Post-operative Plan: Possible Post-op intubation/ventilation and Extubation in OR  Informed Consent: I have reviewed the patients History and Physical, chart, labs and discussed the procedure including the risks, benefits and alternatives for the proposed anesthesia with the patient or authorized representative who has indicated his/her understanding and acceptance.     Plan Discussed with:   Anesthesia Plan Comments:        Anesthesia Quick Evaluation

## 2018-04-14 NOTE — Anesthesia Post-op Follow-up Note (Signed)
Anesthesia QCDR form completed.        

## 2018-04-14 NOTE — Anesthesia Procedure Notes (Signed)
Procedure Name: Intubation Date/Time: 04/14/2018 2:10 AM Performed by: Allean Found, CRNA Pre-anesthesia Checklist: Patient identified, Suction available, Emergency Drugs available, Patient being monitored and Timeout performed Patient Re-evaluated:Patient Re-evaluated prior to induction Oxygen Delivery Method: Circle system utilized Preoxygenation: Pre-oxygenation with 100% oxygen Induction Type: IV induction and Rapid sequence Ventilation: Mask ventilation without difficulty Laryngoscope Size: Mac and 3 Grade View: Grade I Tube type: Oral Number of attempts: 1 Airway Equipment and Method: Stylet Placement Confirmation: ETT inserted through vocal cords under direct vision,  positive ETCO2 and breath sounds checked- equal and bilateral Secured at: 21 cm Tube secured with: Tape Dental Injury: Teeth and Oropharynx as per pre-operative assessment

## 2018-04-15 ENCOUNTER — Encounter: Payer: Self-pay | Admitting: Surgery

## 2018-04-15 LAB — BASIC METABOLIC PANEL
Anion gap: 8 (ref 5–15)
BUN: 20 mg/dL (ref 6–20)
CO2: 28 mmol/L (ref 22–32)
Calcium: 8 mg/dL — ABNORMAL LOW (ref 8.9–10.3)
Chloride: 99 mmol/L — ABNORMAL LOW (ref 101–111)
Creatinine, Ser: 1.26 mg/dL — ABNORMAL HIGH (ref 0.44–1.00)
GFR calc Af Amer: 47 mL/min — ABNORMAL LOW (ref >60)
GFR calc non Af Amer: 40 mL/min — ABNORMAL LOW (ref >60)
Glucose, Bld: 119 mg/dL — ABNORMAL HIGH (ref 65–99)
Potassium: 4.1 mmol/L (ref 3.5–5.1)
Sodium: 135 mmol/L (ref 135–145)

## 2018-04-15 LAB — GLUCOSE, CAPILLARY
Glucose-Capillary: 107 mg/dL — ABNORMAL HIGH (ref 65–99)
Glucose-Capillary: 134 mg/dL — ABNORMAL HIGH (ref 65–99)
Glucose-Capillary: 83 mg/dL (ref 65–99)
Glucose-Capillary: 102 mg/dL — ABNORMAL HIGH (ref 65–99)

## 2018-04-15 LAB — CBC
HCT: 21.9 % — ABNORMAL LOW (ref 35.0–47.0)
HCT: 19.4 % — ABNORMAL LOW (ref 35.0–47.0)
Hemoglobin: 7.1 g/dL — ABNORMAL LOW (ref 12.0–16.0)
Hemoglobin: 6.3 g/dL — ABNORMAL LOW (ref 12.0–16.0)
MCH: 28.9 pg (ref 26.0–34.0)
MCH: 29 pg (ref 26.0–34.0)
MCHC: 32.3 g/dL (ref 32.0–36.0)
MCHC: 32.6 g/dL (ref 32.0–36.0)
MCV: 89.4 fL (ref 80.0–100.0)
MCV: 89 fL (ref 80.0–100.0)
Platelets: 267 10*3/uL (ref 150–440)
Platelets: 213 10*3/uL (ref 150–440)
RBC: 2.44 MIL/uL — ABNORMAL LOW (ref 3.80–5.20)
RBC: 2.19 MIL/uL — ABNORMAL LOW (ref 3.80–5.20)
RDW: 15.6 % — ABNORMAL HIGH (ref 11.5–14.5)
RDW: 15.6 % — ABNORMAL HIGH (ref 11.5–14.5)
WBC: 14.1 10*3/uL — ABNORMAL HIGH (ref 3.6–11.0)
WBC: 9.7 10*3/uL (ref 3.6–11.0)

## 2018-04-15 LAB — ABO/RH: ABO/RH(D): A POS

## 2018-04-15 MED ORDER — ALBUMIN HUMAN 25 % IV SOLN
12.5000 g | Freq: Once | INTRAVENOUS | Status: AC
  Administered 2018-04-15: 12.5 g via INTRAVENOUS
  Filled 2018-04-15 (×3): qty 50

## 2018-04-15 MED ORDER — SODIUM CHLORIDE 0.9 % IV SOLN
Freq: Once | INTRAVENOUS | Status: AC
  Administered 2018-04-15: 17:00:00 via INTRAVENOUS

## 2018-04-15 MED ORDER — ALBUMIN HUMAN 25 % IV SOLN
12.5000 g | Freq: Once | INTRAVENOUS | Status: AC
  Administered 2018-04-15: 12.5 g via INTRAVENOUS
  Filled 2018-04-15: qty 50

## 2018-04-15 NOTE — Progress Notes (Addendum)
POD # 1 S/p open repair ventral hernia w SB resection Feeling well Taking clears Hb dropped to 7.1 she HD adequate She did get lovenox and toradol  PE NAD Abd: incision c/d/i, drain w serosanguinous fluid. No peritonitis  A/P Doing well We will recheck hb ion afternoon likely dilutional, no evidence of HD compromise and pt actually feeling better than yesterday Close monitoring HOld lovenox and Toradol

## 2018-04-15 NOTE — Progress Notes (Signed)
Re-visited the patient this afternoon. On dropped to 6.3. Continues to be hemodynamically stable. Is actually tolerating a full liquid diet when I saw her. States that her abdominal pain is only mild and is improving.  PE NAD ABd: soft, scant drainage from the JP.  No peritonitis.  A/P symptomatically anemia postoperatively.  Will transfuse 2 units of blood and continue to monitor her.  Obviously if she continues to drop we will need to take her back for laparotomy again and source control. Discussed with the patient and family in detail. We are all in agreement

## 2018-04-15 NOTE — Evaluation (Signed)
Physical Therapy Evaluation Patient Details Name: Kristen Schroeder MRN: [id number] DOB: [birth year] Today's Date: 04/15/2018   History of Present Illness  presented to ER secondary to acute abdominal pain, associated nausea/vomiting; admitted with incarcerated ventral hernia, status post surgical repair (with small bowel resection).  Clinical Impression  Upon evaluation, patient alert and oriented; follows commands and demonstrates good effort with all mobility tasks.  Bilat UE/LE strength and ROM grossly symmetrical and WFL; guarded due to abdominal pain with movement and resistance.  Able to complete bed mobility with min/mod assist; sit/stand, basic transfers and very short-distance gait (3-4 steps forward, backward and laterally), min/mod assist with RW.  Very slow and guarded; unsteady in balance; unable to achieve full, upright posture due to abdominal pain. Patient generally hypotensive throughout session (see vitals flowsheet) with further drop noted during transition to upright; mildly symptomatic (dizzy), and does not resolve with accommodation to position.  Additional gait/mobility efforts deferred as result.  RN informed/aware. Would benefit from skilled PT to address above deficits and promote optimal return to PLOF; recommend transition to STR upon discharge from acute hospitalization.     Follow Up Recommendations SNF    Equipment Recommendations       Recommendations for Other Services       Precautions / Restrictions Precautions Precautions: Fall Precaution Comments: full liquid diet, abdominal incision with L lower quadrant JP drain Restrictions Weight Bearing Restrictions: No      Mobility  Bed Mobility Overal bed mobility: Needs Assistance Bed Mobility: Supine to Sit     Supine to sit: Min assist;Mod assist     General bed mobility comments: instructed in log rolling for pain control, abdominal bracing  Transfers Overall transfer level: Needs  assistance Equipment used: Rolling walker (2 wheeled) Transfers: Sit to/from Stand Sit to Stand: Min assist/mod assist         General transfer comment: slow, guarded movement transition; broad BOS  Ambulation/Gait Ambulation/Gait assistance: Min assist/mod assist Ambulation Distance (Feet): 4 Feet(forward/backward x2, lateral x4' bilat) Assistive device: Rolling walker (2 wheeled)       General Gait Details: decreased step height/length, forward trunk flexion (limited by abdominal pain); broad with decreased balance reactions.  Additional distance limited by dizziness/fatigue.  Stairs            Wheelchair Mobility    Modified Rankin (Stroke Patients Only)       Balance Overall balance assessment: Needs assistance Sitting-balance support: Feet supported;No upper extremity supported Sitting balance-Leahy Scale: Good     Standing balance support: Bilateral upper extremity supported Standing balance-Leahy Scale: Fair                               Pertinent Vitals/Pain Pain Assessment: Faces Faces Pain Scale: Hurts even more Pain Location: abdomen Pain Descriptors / Indicators: Aching;Grimacing;Guarding Pain Intervention(s): Limited activity within patient's tolerance;Monitored during session;Repositioned    Home Living Family/patient expects to be discharged to:: Private residence Living Arrangements: Spouse/significant other Available Help at Discharge: Family Type of Home: House Home Access: Stairs to enter Entrance Stairs-Rails: None Entrance Stairs-Number of Steps: 1 Home Layout: One level Home Equipment: Environmental consultant - 4 wheels;Cane - single point      Prior Function Level of Independence: Independent with assistive device(s)         Comments: Mod indep with 4WRW for ADLs, household and community mobilization as appropriate; husband assists with ADLs as appropriate; denies fall history; home O2 at  3L     Hand Dominance         Extremity/Trunk Assessment   Upper Extremity Assessment Upper Extremity Assessment: Overall WFL for tasks assessed    Lower Extremity Assessment Lower Extremity Assessment: Overall WFL for tasks assessed(grossly at least 4/5, resistance limited to protect abdominal musculature)       Communication   Communication: No difficulties  Cognition Arousal/Alertness: Awake/alert Behavior During Therapy: WFL for tasks assessed/performed Overall Cognitive Status: Within Functional Limits for tasks assessed                                        General Comments      Exercises     Assessment/Plan    PT Assessment Patient needs continued PT services  PT Problem List Decreased strength;Decreased activity tolerance;Decreased balance;Decreased mobility;Decreased coordination;Decreased cognition;Decreased knowledge of use of DME;Decreased safety awareness;Decreased knowledge of precautions;Decreased skin integrity;Pain       PT Treatment Interventions DME instruction;Gait training;Stair training;Functional mobility training;Therapeutic activities;Therapeutic exercise;Balance training;Patient/family education    PT Goals (Current goals can be found in the Care Plan section)  Acute Rehab PT Goals Patient Stated Goal: to get stronger PT Goal Formulation: With patient/family Time For Goal Achievement: 04/29/18 Potential to Achieve Goals: Good    Frequency Min 2X/week   Barriers to discharge Decreased caregiver support      Co-evaluation               AM-PAC PT "6 Clicks" Daily Activity  Outcome Measure Difficulty turning over in bed (including adjusting bedclothes, sheets and blankets)?: Unable Difficulty moving from lying on back to sitting on the side of the bed? : Unable Difficulty sitting down on and standing up from a chair with arms (e.g., wheelchair, bedside commode, etc,.)?: Unable Help needed moving to and from a bed to chair (including a  wheelchair)?: A Little Help needed walking in hospital room?: A Lot Help needed climbing 3-5 steps with a railing? : A Lot 6 Click Score: 10    End of Session Equipment Utilized During Treatment: Gait belt Activity Tolerance: Patient tolerated treatment well Patient left: in bed;with call bell/phone within reach;with bed alarm set;with family/visitor present Nurse Communication: Mobility status PT Visit Diagnosis: Muscle weakness (generalized) (M62.81);Difficulty in walking, not elsewhere classified (R26.2);Pain    Time: 1110-1143 PT Time Calculation (min) (ACUTE ONLY): 33 min   Charges:   PT Evaluation $PT Eval Moderate Complexity: 1 Mod PT Treatments $Therapeutic Activity: 8-22 mins   PT G Codes:       Evaluation/treatment session was lead and completed by Iris PertMicah Munoz, SPT; directly observed, guided and documented by Cephus SlaterKristen Lynx Goodrich, PT.  Tommy RainwaterKristen H. Manson PasseyBrown, PT, DPT, NCS 04/15/18, 11:44 AM [phone]

## 2018-04-15 NOTE — NC FL2 (Signed)
[place] MEDICAID FL2 LEVEL OF CARE SCREENING TOOL     IDENTIFICATION  Patient Name: Kristen Schroeder Birthdate: [birth year] Sex: female Admission Date (Current Location): 04/13/2018  [place]   Facility and Address:  [facility], [address], [place], [place] [address]      Provider Number: [id number]  Attending Physician Name and Address:  Leafy RoPabon, Diego F, MD  Relative Name and Phone Number:       Current Level of Care: Hospital Recommended Level of Care: Skilled Nursing Facility Prior Approval Number:    Date Approved/Denied:   PASRR Number: [id number]  Discharge Plan: SNF    Current Diagnoses: Patient Active Problem List   Diagnosis Date Noted  . Incarcerated hernia 04/14/2018  . Incarcerated hernia of abdominal cavity   . Aneurysm (HCC) 07/10/2016  . Nonspecific abnormal finding 05/11/2015  . Allergic rhinitis 05/11/2015  . Anxiety 05/11/2015  . Bronchitis, chronic (HCC) 05/11/2015  . CCF (congestive cardiac failure) (HCC) 05/11/2015  . Clinical depression 05/11/2015  . 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 Essential (primary) hypertension 05/11/2015  . Acid reflux 05/11/2015  . HLD (hyperlipidemia) 05/11/2015  . Adult hypothyroidism 05/11/2015  . Cervical dysplasia, mild 05/11/2015  . Neuropathy 05/11/2015  . Adiposity 05/11/2015  . Obstructive apnea 05/11/2015  . Arthritis, degenerative 05/11/2015    Orientation RESPIRATION BLADDER Height & Weight     Self, Time, Situation, Place  Normal, O2(3 liters) Continent Weight: 222 lb 11.2 oz (101 kg) Height:  5\' 8"  (172.7 cm)  BEHAVIORAL SYMPTOMS/MOOD NEUROLOGICAL BOWEL NUTRITION STATUS  (none) (none) Continent Diet(regular)  AMBULATORY STATUS COMMUNICATION OF NEEDS Skin   Extensive Assist Verbally Surgical wounds                       Personal Care Assistance Level of Assistance  Bathing, Dressing Bathing Assistance:  Limited assistance   Dressing Assistance: Limited assistance     Functional Limitations Info  (no issues)          SPECIAL CARE FACTORS FREQUENCY  PT (By licensed PT)                    Contractures Contractures Info: Not present    Additional Factors Info  Code Status, Allergies Code Status Info: full Allergies Info: codeine           Current Medications (04/15/2018):  This is the current hospital active medication list Current Facility-Administered Medications  Medication Dose Route Frequency Provider Last Rate Last Dose  . acetaminophen (TYLENOL) tablet 1,000 mg  1,000 mg Oral Q6H Pabon, Diego F, MD   1,000 mg at 04/15/18 [id number]  . arformoterol (BROVANA) nebulizer solution 15 mcg  15 mcg Nebulization BID Sterling BigPabon, Diego F, MD   15 mcg at 04/15/18 0721  . fluticasone (FLONASE) 50 MCG/ACT nasal spray 1 spray  1 spray Each Nare Daily Leafy RoPabon, Diego F, MD   1 spray at 04/15/18 0906  . gabapentin (NEURONTIN) capsule 600 mg  600 mg Oral q morning - 10a Pabon, Diego F, MD   600 mg at 04/15/18 0906   And  . gabapentin (NEURONTIN) capsule 300 mg  300 mg Oral Q1500 Pabon, Diego F, MD   300 mg at 04/15/18 1416  . gabapentin (NEURONTIN) capsule 600 mg  600 mg Oral QHS Pabon, [place]Die[place] F, MD   600 mg at 04/14/18 2328  . hydrALAZINE (APRESOLINE) injection 10 mg  10 mg Intravenous Q2H  PRN Pabon, [place] F, MD      . insulin aspart (novoLOG) injection 0-15 Units  0-15 Units Subcutaneous TID WC Leafy Ro, MD   2 Units at 04/15/18 1210  . insulin aspart (novoLOG) injection 0-5 Units  0-5 Units Subcutaneous QHS Pabon, Diego F, MD      . insulin aspart (novoLOG) injection 4 Units  4 Units Subcutaneous TID WC Leafy Ro, MD   4 Units at 04/15/18 1210  . ipratropium-albuterol (DUONEB) 0.5-2.5 (3) MG/3ML nebulizer solution 3 mL  3 mL Nebulization Q6H PRN Pabon, Diego F, MD      . lactated ringers infusion   Intravenous Continuous Pabon, Diego F, MD 50 mL/hr at 04/15/18 1213    . levothyroxine  (SYNTHROID, LEVOTHROID) tablet 125 mcg  125 mcg Oral QAC breakfast Sterling Big F, MD   125 mcg at 04/15/18 0906  . LORazepam (ATIVAN) tablet 0.5 mg  0.5 mg Oral Q6H PRN Pabon, Diego F, MD      . metFORMIN (GLUCOPHAGE) tablet 1,000 mg  1,000 mg Oral BID AC Pabon, Diego F, MD   1,000 mg at 04/15/18 0808  . mometasone-formoterol (DULERA) 100-5 MCG/ACT inhaler 2 puff  2 puff Inhalation BID Leafy Ro, MD   2 puff at 04/15/18 0906  . morphine 2 MG/ML injection 2 mg  2 mg Intravenous Q2H PRN Pabon, Diego F, MD      . ondansetron (ZOFRAN-ODT) disintegrating tablet 4 mg  4 mg Oral Q6H PRN Pabon, Diego F, MD       Or  . ondansetron (ZOFRAN) injection 4 mg  4 mg Intravenous Q6H PRN Leafy Ro, MD   4 mg at 04/14/18 0801  . oxyCODONE (Oxy IR/ROXICODONE) immediate release tablet 5-10 mg  5-10 mg Oral Q4H PRN Pabon, Diego F, MD      . pantoprazole (PROTONIX) EC tablet 40 mg  40 mg Oral Daily Pabon, [place] F, MD   40 mg at 04/15/18 0906  . roflumilast (DALIRESP) tablet 500 mcg  500 mcg Oral Daily Sterling Big F, MD   500 mcg at 04/15/18 4098     Discharge Medications: Please see discharge summary for a list of discharge medications.  Relevant Imaging Results:  Relevant Lab Results:   Additional Information ss: [id number]  York Spaniel, LCSW

## 2018-04-15 NOTE — Progress Notes (Signed)
Per MD okay for RN to order PT consult.  

## 2018-04-15 NOTE — Progress Notes (Addendum)
Inpatient Diabetes Program Recommendations  AACE/ADA: New Consensus Statement on Inpatient Glycemic Control (2015)  Target Ranges:  Prepandial:   less than 140 mg/dL      Peak postprandial:   less than 180 mg/dL (1-2 hours)      Critically ill patients:  140 - 180 mg/dL   Results for Kristen KaufmanHAITH, Iyonnah Schroeder (MRN [id number]) as of 04/15/2018 11:11  Ref. Range 04/14/2018 07:34 04/14/2018 11:44 04/14/2018 17:03 04/14/2018 21:23 04/14/2018 23:56  Glucose-Capillary Latest Ref Range: 65 - 99 mg/dL 409155 (H)  7 units NOVOLOG  133 (H)  6 units NOVOLOG  116 (H)  4 units NOVOLOG  60 (Schroeder) 87   Results for Kristen KaufmanHAITH, Kristen Schroeder (MRN [id number]) as of 04/15/2018 11:11  Ref. Range 04/15/2018 07:41  Glucose-Capillary Latest Ref Range: 65 - 99 mg/dL 956107 (H)   Admit: Acute abdomen and incarcerated ventral hernia  History: DM  Home DM Meds: Metformin 1000 mg daily  Current Orders: Novolog Moderate Correction Scale/ SSI (0-15 units) TID AC + HS      Novolog 4 units TID with meals      Metformin 1000 mg BID    Note patient with Hypoglcyemia last night at bedtime after receiving 4 units Novolog Meal Coverage at 5pm.   MD- Please consider the following in-hospital insulin adjustments:  1. Discontinue Novolog 4 units TID with meals for now  2. Continue Novolog Moderate (0-15 units) SSI  3. Reduce Metformin to 1000 mg daily (patient only listed as taking 1000 mg daily at home)     --Will follow patient during hospitalization--  Ambrose FinlandJeannine Johnston Thomasine Klutts RN, MSN, CDE Diabetes Coordinator Inpatient Glycemic Control Team Team Pager: [phone] (8a-5p)

## 2018-04-15 NOTE — Progress Notes (Addendum)
Notified MD pt's hemoglobin is 6.3. Per MD okay for RN to place order for the pt to be type and screen.

## 2018-04-16 LAB — BASIC METABOLIC PANEL
Anion gap: 4 — ABNORMAL LOW (ref 5–15)
BUN: 13 mg/dL (ref 6–20)
CO2: 31 mmol/L (ref 22–32)
Calcium: 8.3 mg/dL — ABNORMAL LOW (ref 8.9–10.3)
Chloride: 106 mmol/L (ref 101–111)
Creatinine, Ser: 0.61 mg/dL (ref 0.44–1.00)
GFR calc Af Amer: >60 mL/min (ref >60)
GFR calc non Af Amer: >60 mL/min (ref >60)
Glucose, Bld: 108 mg/dL — ABNORMAL HIGH (ref 65–99)
Potassium: 4.3 mmol/L (ref 3.5–5.1)
Sodium: 141 mmol/L (ref 135–145)

## 2018-04-16 LAB — GLUCOSE, CAPILLARY
Glucose-Capillary: 94 mg/dL (ref 65–99)
Glucose-Capillary: 99 mg/dL (ref 65–99)
Glucose-Capillary: 103 mg/dL — ABNORMAL HIGH (ref 65–99)
Glucose-Capillary: 110 mg/dL — ABNORMAL HIGH (ref 65–99)
Glucose-Capillary: 96 mg/dL (ref 65–99)

## 2018-04-16 LAB — PROTIME-INR
INR: 1.08
Prothrombin Time: 13.9 seconds (ref 11.4–15.2)

## 2018-04-16 LAB — CBC
HCT: 26.7 % — ABNORMAL LOW (ref 35.0–47.0)
Hemoglobin: 8.7 g/dL — ABNORMAL LOW (ref 12.0–16.0)
MCH: 29.3 pg (ref 26.0–34.0)
MCHC: 32.8 g/dL (ref 32.0–36.0)
MCV: 89.2 fL (ref 80.0–100.0)
Platelets: 185 10*3/uL (ref 150–440)
RBC: 2.99 MIL/uL — ABNORMAL LOW (ref 3.80–5.20)
RDW: 15.8 % — ABNORMAL HIGH (ref 11.5–14.5)
WBC: 8.3 10*3/uL (ref 3.6–11.0)

## 2018-04-16 LAB — APTT: aPTT: 37 seconds — ABNORMAL HIGH (ref 24–36)

## 2018-04-16 NOTE — Care Management (Signed)
Patient from home with husband and son.  Status post PROCEDURES: 1. Ventral hernia Repair (primary) 2. Partial omentectomy 3. Small bowel resection  PCP [facility].  Pharmacy [facility].  Denies issues obtaining medications.  PT has assessed patient and recommends SNF.  Patient states that she plans to return home at discharge.  Patient agreeable for home health services. Patient states that she does not have a preference of agency.  [facility]  Patient will need RN, PT, aide, and a RW at discharge.  Patient has chronic O2 with [employer], states that she wears 3-4 liters.  Family to bring portable O2 at discharge.  RNCM following

## 2018-04-16 NOTE — Progress Notes (Signed)
SURGICAL PROGRESS NOTE    Kristen Schroeder  RUE:[id number] DOB: 02-Aug-1941 DOA: 04/13/2018  History: The patient is a 77 year-old female with a history of COPD, DM, HTN, dysrhythmia, and hypothyroidism who was admitted with an incarcerated ventral hernia. She is POD #2 from primary ventral hernia repair with small bowel resection and omentectomy. Yesterday she experienced a drop in hemoglobin to 6.3. Toradol and Lovenox were held and the patient was transfused 2 units of PRBC. Today her hemoglobin is 8.7.  Subjective: The patient states that she is feeling well. She denies unusual SOB. She denies chest pain. She is up in the chair. She tolerated full liquids yesterday. She denies BM. She reports flatus. She denies fever and chills. She denies nausea and vomiting.  Objective: Vitals:   04/16/18 0130 04/16/18 0329 04/16/18 0733 04/16/18 1306  BP: (!) 126/58 (!) 125/47  127/60  Pulse: 92 85  90  Resp: 16 20  18   Temp: 98.6 F (37 C) 99.1 F (37.3 C)  98.6 F (37 C)  TempSrc: Oral Oral  Oral  SpO2: 99% 99% 100% 100%  Weight:      Height:        Examination:  General exam: Appears calm and comfortable  Respiratory system: Respiratory effort normal. Cardiovascular system: RRR. No pedal edema. Gastrointestinal system: Abdomen is nondistended, soft and mildly tender about incision. Incision is clean, dry, and intact beneath surgical dressing. Scant serosanguinous drainage from JP. Central nervous system: Alert and oriented. No focal neurological deficits. Extremities: No calf swelling or tenderness. Psychiatry: Judgement and insight appear normal. Mood & affect appropriate.    Data Reviewed: I have personally reviewed following labs and imaging studies  CBC: Recent Labs  Lab 04/13/18 2026 04/14/18 0618 04/15/18 0513 04/15/18 1502 04/16/18 0312  WBC 9.6 21.0* 14.1* 9.7 8.3  HGB 11.1* 9.8* 7.1* 6.3* 8.7*  HCT 33.8* 30.5* 21.9* 19.4* 26.7*  MCV 89.3 89.3 89.4 89.0 89.2  PLT 270  320 267 213 185   Basic Metabolic Panel: Recent Labs  Lab 04/13/18 2026 04/14/18 0618 04/15/18 0513 04/16/18 0312  NA 139 138 135 141  K 4.4 4.3 4.1 4.3  CL 97* 101 99* 106  CO2 32 30 28 31   GLUCOSE 116* 210* 119* 108*  BUN 11 12 20 13   CREATININE 0.76 0.76 1.26* 0.61  CALCIUM 9.0 8.2* 8.0* 8.3*    Coagulation Profile: Recent Labs  Lab 04/16/18 0312  INR 1.08   CBG: Recent Labs  Lab 04/15/18 1155 04/15/18 1620 04/15/18 2154 04/16/18 0757 04/16/18 1119  GLUCAP 134* 83 102* 94 99    Scheduled Meds: . acetaminophen  1,000 mg Oral Q6H  . arformoterol  15 mcg Nebulization BID  . fluticasone  1 spray Each Nare Daily  . gabapentin  600 mg Oral q morning - 10a   And  . gabapentin  300 mg Oral Q1500  . gabapentin  600 mg Oral QHS  . insulin aspart  0-15 Units Subcutaneous TID WC  . insulin aspart  0-5 Units Subcutaneous QHS  . insulin aspart  4 Units Subcutaneous TID WC  . levothyroxine  125 mcg Oral QAC breakfast  . mometasone-formoterol  2 puff Inhalation BID  . pantoprazole  40 mg Oral Daily  . roflumilast  500 mcg Oral Daily   Continuous Infusions: . lactated ringers 100 mL/hr at 04/16/18 1022     LOS: 2 days    Assessment/Plan: The patient is a 77 year-old female with a history of  COPD, DM, HTN, dysrhythmia, and hypothyroidism who was admitted with an incarcerated ventral hernia. She is POD #2 from primary ventral hernia repair with small bowel resection and omentectomy. Hemoglobin rose appropriately from 6.3 to 8.7 following transfusion of 2 units PRBC.  - Continue to trend hemoglobin daily, or more frequently if symptomatic. - Hold lovenox until hemoglobin is stable for 48 hours. DVT prophylaxis with SCDs and ambulation until then. - Full liquid diet re-ordered. - Continue pain management. - Continue IVF hydration until patient is taking adequate PO. - Continue medical management of DM, COPD, hypothyroidism, and HTN.   Kristen AgeAviva W  [name], DO

## 2018-04-16 NOTE — Progress Notes (Signed)
Physical Therapy Treatment Patient Details Name: Kristen Schroeder MRN: [id number] DOB: Feb 02, 1941 Today's Date: 04/16/2018    History of Present Illness presented to ER secondary to acute abdominal pain, associated nausea/vomiting; admitted with incarcerated ventral hernia, status post surgical repair (with small bowel resection).    PT Comments    Patient status post blood transfusion (2 units) previous date; current HgB 8.7.  Vitals much more stable, patietn asymptomatic with transition to upright this date.  Able to complete sit/stand with slightly less assist and progress gait distance to 40' with RW, min assist. Remains very slow and guarded with all mobility tasks (due to abdominal pain), but good effort with all activities.    Follow Up Recommendations  SNF     Equipment Recommendations       Recommendations for Other Services       Precautions / Restrictions Precautions Precautions: Fall Precaution Comments: full liquid diet, abdominal incision with L lower quadrant JP drain Restrictions Weight Bearing Restrictions: No    Mobility  Bed Mobility Overal bed mobility: Needs Assistance Bed Mobility: Supine to Sit     Supine to sit: Mod assist;Min assist     General bed mobility comments: limited carry-over of log-rolling technique  Transfers Overall transfer level: Needs assistance Equipment used: Rolling walker (2 wheeled) Transfers: Sit to/from Stand Sit to Stand: Min assist;Mod assist         General transfer comment: broad BOS; very slow, guarded movement  Ambulation/Gait Ambulation/Gait assistance: Min assist Ambulation Distance (Feet): (40' x1, 12' x2) Assistive device: Rolling walker (2 wheeled)       General Gait Details: broad BOS, forward trunk flexion with downward gaze; slow and guarded, but no buckling or LOB.  Vitals stable and WFL; no orthostasis noted   Stairs             Wheelchair Mobility    Modified Rankin (Stroke Patients  Only)       Balance Overall balance assessment: Needs assistance Sitting-balance support: No upper extremity supported;Feet supported Sitting balance-Leahy Scale: Good     Standing balance support: Bilateral upper extremity supported Standing balance-Leahy Scale: Fair                              Cognition Arousal/Alertness: Awake/alert Behavior During Therapy: WFL for tasks assessed/performed Overall Cognitive Status: Within Functional Limits for tasks assessed                                        Exercises Other Exercises Other Exercises: Toilet transfer, ambulatory with RW, min/mod assist for sit/stand from lower surface height; min assist for gait and standing balance with RW.  Continues to maintain forward trunk flexion due to pain    General Comments        Pertinent Vitals/Pain Faces Pain Scale: Hurts little more Pain Location: abdomen Pain Descriptors / Indicators: Aching;Grimacing;Guarding Pain Intervention(s): Limited activity within patient's tolerance;Monitored during session;Repositioned    Home Living                      Prior Function            PT Goals (current goals can now be found in the care plan section) Acute Rehab PT Goals Patient Stated Goal: to get stronger PT Goal Formulation: With patient/family Time For Goal Achievement: 04/29/18  Potential to Achieve Goals: Good Progress towards PT goals: Progressing toward goals    Frequency    Min 2X/week      PT Plan Current plan remains appropriate    Co-evaluation              AM-PAC PT "6 Clicks" Daily Activity  Outcome Measure  Difficulty turning over in bed (including adjusting bedclothes, sheets and blankets)?: Unable Difficulty moving from lying on back to sitting on the side of the bed? : Unable Difficulty sitting down on and standing up from a chair with arms (e.g., wheelchair, bedside commode, etc,.)?: Unable Help needed moving to  and from a bed to chair (including a wheelchair)?: A Lot Help needed walking in hospital room?: A Lot Help needed climbing 3-5 steps with a railing? : A Lot 6 Click Score: 9    End of Session Equipment Utilized During Treatment: Gait belt;Oxygen Activity Tolerance: Patient tolerated treatment well Patient left: in bed;with bed alarm set;with call bell/phone within reach;with family/visitor present Nurse Communication: Mobility status PT Visit Diagnosis: Muscle weakness (generalized) (M62.81);Difficulty in walking, not elsewhere classified (R26.2);Pain     Time: 9604-5409 PT Time Calculation (min) (ACUTE ONLY): 35 min  Charges:  $Gait Training: 8-22 mins $Therapeutic Activity: 8-22 mins                    G Codes:      Evaluation/treatment session was lead and completed by Iris Pert, SPT; directly observed, guided and documented by Cephus Slater, PT.  Tommy Rainwater. Manson Passey, PT, DPT, NCS 04/16/18, 5:07 PM [phone]

## 2018-04-17 LAB — TYPE AND SCREEN
ABO/RH(D): A POS
Antibody Screen: NEGATIVE
Unit division: 0
Unit division: 0
Unit division: 0
Unit division: 0

## 2018-04-17 LAB — BASIC METABOLIC PANEL
Anion gap: 5 (ref 5–15)
BUN: 7 mg/dL (ref 6–20)
CO2: 32 mmol/L (ref 22–32)
Calcium: 8.8 mg/dL — ABNORMAL LOW (ref 8.9–10.3)
Chloride: 104 mmol/L (ref 101–111)
Creatinine, Ser: 0.4 mg/dL — ABNORMAL LOW (ref 0.44–1.00)
GFR calc Af Amer: >60 mL/min (ref >60)
GFR calc non Af Amer: >60 mL/min (ref >60)
Glucose, Bld: 96 mg/dL (ref 65–99)
Potassium: 4.3 mmol/L (ref 3.5–5.1)
Sodium: 141 mmol/L (ref 135–145)

## 2018-04-17 LAB — CBC WITH DIFFERENTIAL/PLATELET
Basophils Absolute: 0 10*3/uL (ref 0–0.1)
Basophils Relative: 0 %
Eosinophils Absolute: 0.1 10*3/uL (ref 0–0.7)
Eosinophils Relative: 2 %
HCT: 27.1 % — ABNORMAL LOW (ref 35.0–47.0)
Hemoglobin: 9 g/dL — ABNORMAL LOW (ref 12.0–16.0)
Lymphocytes Relative: 9 %
Lymphs Abs: 0.8 10*3/uL — ABNORMAL LOW (ref 1.0–3.6)
MCH: 29.2 pg (ref 26.0–34.0)
MCHC: 33.1 g/dL (ref 32.0–36.0)
MCV: 88.3 fL (ref 80.0–100.0)
Monocytes Absolute: 0.9 10*3/uL (ref 0.2–0.9)
Monocytes Relative: 10 %
Neutro Abs: 7 10*3/uL — ABNORMAL HIGH (ref 1.4–6.5)
Neutrophils Relative %: 79 %
Platelets: 186 10*3/uL (ref 150–440)
RBC: 3.07 MIL/uL — ABNORMAL LOW (ref 3.80–5.20)
RDW: 15.6 % — ABNORMAL HIGH (ref 11.5–14.5)
WBC: 8.8 10*3/uL (ref 3.6–11.0)

## 2018-04-17 LAB — BPAM RBC
Blood Product Expiration Date: 201907012359
Blood Product Expiration Date: 201907012359
Blood Product Expiration Date: 201907042359
Blood Product Expiration Date: 201907042359
ISSUE DATE / TIME: 201906111723
ISSUE DATE / TIME: 201906112039
Unit Type and Rh: 6200
Unit Type and Rh: 6200
Unit Type and Rh: 6200
Unit Type and Rh: 6200

## 2018-04-17 LAB — GLUCOSE, CAPILLARY
Glucose-Capillary: 83 mg/dL (ref 65–99)
Glucose-Capillary: 84 mg/dL (ref 65–99)
Glucose-Capillary: 102 mg/dL — ABNORMAL HIGH (ref 65–99)
Glucose-Capillary: 61 mg/dL — ABNORMAL LOW (ref 65–99)
Glucose-Capillary: 66 mg/dL (ref 65–99)
Glucose-Capillary: 82 mg/dL (ref 65–99)

## 2018-04-17 LAB — PREPARE RBC (CROSSMATCH)

## 2018-04-17 LAB — SURGICAL PATHOLOGY

## 2018-04-17 MED ORDER — GLUCOSE 4 G PO CHEW
CHEWABLE_TABLET | ORAL | Status: AC
  Filled 2018-04-17: qty 3

## 2018-04-17 NOTE — Care Management Important Message (Signed)
Copy of signed IM left with patient in room.  

## 2018-04-17 NOTE — Progress Notes (Signed)
3 Days Post-Op  Subjective: Patient is postop day 3 from repair of a ventral hernia and bowel resection.  She is on a full liquid diet at this point and tolerating it well.  No nasogastric tube is in place.  He is not yet having bowel movements or passing gas.  Objective: Vital signs in last 24 hours: Temp:  [97.7 F (36.5 C)-98.6 F (37 C)] 97.9 F (36.6 C) (06/13 0440) Pulse Rate:  [79-90] 79 (06/13 0440) Resp:  [18-20] 18 (06/13 0440) BP: (105-139)/(60-74) 136/65 (06/13 0440) SpO2:  [97 %-100 %] 97 % (06/13 0725) Last BM Date: 04/13/18  Intake/Output from previous day: 06/12 0701 - 06/13 0700 In: 2069 [P.O.:120; I.V.:1949] Out: 418 [Urine:400; Drains:18] Intake/Output this shift: No intake/output data recorded.  Physical exam:  Wound is clean no erythema no drainage  Lab Results: CBC  Recent Labs    04/16/18 0312 04/17/18 0401  WBC 8.3 8.8  HGB 8.7* 9.0*  HCT 26.7* 27.1*  PLT 185 186   BMET Recent Labs    04/16/18 0312 04/17/18 0401  NA 141 141  K 4.3 4.3  CL 106 104  CO2 31 32  GLUCOSE 108* 96  BUN 13 7  CREATININE 0.61 0.40*  CALCIUM 8.3* 8.8*   PT/INR Recent Labs    04/16/18 0312  LABPROT 13.9  INR 1.08   ABG No results for input(s): PHART, HCO3 in the last 72 hours.  Invalid input(s): PCO2, PO2  Studies/Results: No results found.  Anti-infectives: Anti-infectives (From admission, onward)   Start     Dose/Rate Route Frequency Ordered Stop   04/14/18 1000  cefoTEtan (CEFOTAN) 2 g in sodium chloride 0.9 % 100 mL IVPB     2 g 200 mL/hr over 30 Minutes Intravenous Every 12 hours 04/14/18 0530 04/14/18 2358   04/14/18 0045  piperacillin-tazobactam (ZOSYN) IVPB 3.375 g     3.375 g 100 mL/hr over 30 Minutes Intravenous  Once 04/14/18 0030 04/14/18 0130      Assessment/Plan: s/p Procedure(s): HERNIA REPAIR VENTRAL ADULT SMALL BOWEL RESECTION   H&H is stable.  Abdomen doing well and tolerating a full liquid diet.  I would not advance  her diet until she is having bowel movements and showing some form of bowel function.  Otherwise she is doing well.  Lattie Hawichard E Lenell Mcconnell, MD, FACS  04/17/2018

## 2018-04-18 LAB — GLUCOSE, CAPILLARY
Glucose-Capillary: 89 mg/dL (ref 65–99)
Glucose-Capillary: 100 mg/dL — ABNORMAL HIGH (ref 65–99)
Glucose-Capillary: 85 mg/dL (ref 65–99)
Glucose-Capillary: 93 mg/dL (ref 65–99)

## 2018-04-18 NOTE — Progress Notes (Signed)
Physical Therapy Treatment Patient Details Name: Kristen KaufmanRuthie L Schroeder MRN: [id number] DOB: May 23, 1941 Today's Date: 04/18/2018    History of Present Illness Pt is a 77 year old female who presented to ER secondary to acute abdominal pain, associated nausea/vomiting; admitted with incarcerated ventral hernia, status post surgical repair (with small bowel resection).    PT Comments    Patient demonstrated progression with bed mobility, STS/stand pivot transfers and pain level. PT assisted pt to EOB and with standing pivot transfer to chair.  Pt reports 4/10 pain during STS and scooting to EOB.  She still maintains a wide BOS and low foot clearance during transfers and gait and required min bilateral hand held assist. Pt had just walked with nursing to bathroom CGA using RW, which therapist was able to observe.  PT issued and reviewed a LE HEP and pt was able to complete sets of all there ex in chair.  PT provided education, manual and VC's for form and controlled motion throughout there ex.  PT educated pt concerning controlled eccentric contraction during SLR and pt was able to perform with no report of pain increase.  PT answered all questions that family asked concerning HEP and home health.  Pt will continue to benefit from skilled PT with focus on strength, gait, balance and fall prevention, pain management, HEP and functional mobility.  Follow Up Recommendations  Home health PT     Equipment Recommendations  None recommended by PT    Recommendations for Other Services       Precautions / Restrictions Precautions Precautions: Fall Precaution Comments: full liquid diet, abdominal incision with L lower quadrant JP drain Restrictions Weight Bearing Restrictions: No    Mobility  Bed Mobility Overal bed mobility: Needs Assistance Bed Mobility: Supine to Sit     Supine to sit: Min assist     General bed mobility comments: Able to bring LE's over EOB and come to sitting position.   Required min A to scoot to EOB and place feet on floor.  Transfers Overall transfer level: Needs assistance Equipment used: 1 person hand held assist Transfers: Sit to/from UGI CorporationStand;Stand Pivot Transfers Sit to Stand: Min assist;Mod assist Stand pivot transfers: Min assist       General transfer comment: broad BOS; slow, guarded movement; lateral rocking movement to clear floor with feet.  Able to stand from bedside with min hand held assists.  Ambulation/Gait Ambulation/Gait assistance: (Pt ambulated to bathroom with nursing using RW prior to treatment.  Pt required CGA.)               Stairs             Wheelchair Mobility    Modified Rankin (Stroke Patients Only)       Balance Overall balance assessment: Needs assistance Sitting-balance support: No upper extremity supported;Feet supported Sitting balance-Leahy Scale: Good     Standing balance support: Bilateral upper extremity supported Standing balance-Leahy Scale: Fair                              Cognition Arousal/Alertness: Awake/alert Behavior During Therapy: WFL for tasks assessed/performed Overall Cognitive Status: Within Functional Limits for tasks assessed                                        Exercises General Exercises - Lower Extremity Ankle Circles/Pumps: 20  reps;AROM;Both;Seated Quad Sets: Strengthening;Both;10 reps;Seated Short Arc Quad: Strengthening;Both;10 reps;Seated Hip ABduction/ADduction: AAROM;Both;10 reps;Seated(pillow squeezes, AAROM hip abduction) Straight Leg Raises: AROM;10 reps;Seated;Strengthening(manual cues for full range.) Other Exercises Other Exercises: Issued and reviewed HEP and answered pt's questions concerning home health therapy and HEP.    General Comments        Pertinent Vitals/Pain Pain Assessment: Faces Faces Pain Scale: Hurts a little bit Pain Location: Lower abdomen during movement. Pain Intervention(s): Monitored  during session;Limited activity within patient's tolerance    Home Living                      Prior Function            PT Goals (current goals can now be found in the care plan section) Acute Rehab PT Goals Patient Stated Goal: to get stronger PT Goal Formulation: With patient/family Time For Goal Achievement: 04/29/18 Potential to Achieve Goals: Good Progress towards PT goals: Progressing toward goals    Frequency    Min 2X/week      PT Plan Discharge plan needs to be updated    Co-evaluation              AM-PAC PT "6 Clicks" Daily Activity  Outcome Measure  Difficulty turning over in bed (including adjusting bedclothes, sheets and blankets)?: A Little Difficulty moving from lying on back to sitting on the side of the bed? : A Little Difficulty sitting down on and standing up from a chair with arms (e.g., wheelchair, bedside commode, etc,.)?: A Little Help needed moving to and from a bed to chair (including a wheelchair)?: A Little Help needed walking in hospital room?: A Little Help needed climbing 3-5 steps with a railing? : A Lot 6 Click Score: 17    End of Session Equipment Utilized During Treatment: Gait belt;Oxygen Activity Tolerance: Patient tolerated treatment well Patient left: with call bell/phone within reach;with family/visitor present;in chair;with chair alarm set   PT Visit Diagnosis: Muscle weakness (generalized) (M62.81);Difficulty in walking, not elsewhere classified (R26.2);Pain     Time: 1500-1540 PT Time Calculation (min) (ACUTE ONLY): 40 min  Charges:  $Therapeutic Exercise: 8-22 mins $Therapeutic Activity: 8-22 mins                    G Codes:  Functional Assessment Tool Used: AM-PAC 6 Clicks Basic Mobility    Glenetta Hew, PT, DPT    Glenetta Hew 04/18/2018, 4:03 PM

## 2018-04-18 NOTE — Care Management (Signed)
RNCM confirmed with patient plan is to still discharge home with home health when medically cleared

## 2018-04-18 NOTE — Progress Notes (Signed)
SURGICAL PROGRESS NOTE  Hospital Day(s): 4.   Post op day(s): 4 Days Post-Op.   Interval History: Patient seen and examined, no acute events or new complaints overnight. Patient reports +flatus with +BM, tolerating full liquids diet without N/V, denies fever/chills, CP, or SOB, though reports she has only ambulated once in the halls and none today.  Review of Systems:  Constitutional: denies fever, chills  Respiratory: denies any shortness of breath  Cardiovascular: denies chest pain or palpitations  Gastrointestinal: abdominal pain, N/V, and bowel function as per interval history Musculoskeletal: denies pain, decreased motor or sensation Integumentary: denies any other rashes or skin discolorations except post-surgical abdominal wounds  Vital signs in last 24 hours: [min-max] current  Temp:  [97.7 F (36.5 C)-98.8 F (37.1 C)] 98.8 F (37.1 C) (06/14 0338) Pulse Rate:  [81-85] 81 (06/14 0338) Resp:  [18-19] 19 (06/14 0338) BP: (115-149)/(61-77) 144/77 (06/14 0338) SpO2:  [98 %-99 %] 99 % (06/14 0727)     Height: 5\' 8"  (172.7 cm) Weight: 222 lb 11.2 oz (101 kg) BMI (Calculated): 33.87   Intake/Output this shift:  No intake/output data recorded.   Intake/Output last 2 shifts:  @IOLAST2SHIFTS @   Physical Exam:  Constitutional: alert, cooperative and no distress  Respiratory: breathing non-labored at rest  Cardiovascular: regular rate and sinus rhythm  Gastrointestinal: soft, non-tender, and non-distended, post-surgical abdominal wound well-approximated without surrounding erythema or drainage  Labs:  CBC Latest Ref Rng & Units 04/17/2018 04/16/2018 04/15/2018  WBC 3.6 - 11.0 K/uL 8.8 8.3 9.7  Hemoglobin 12.0 - 16.0 g/dL 9.0(L) 8.7(L) 6.3(L)  Hematocrit 35.0 - 47.0 % 27.1(L) 26.7(L) 19.4(L)  Platelets 150 - 440 K/uL 186 185 213   CMP Latest Ref Rng & Units 04/17/2018 04/16/2018 04/15/2018  Glucose 65 - 99 mg/dL 96 098(J108(H) 191(Y119(H)  BUN 6 - 20 mg/dL 7 13 20   Creatinine 0.44 -  1.00 mg/dL 7.82(N0.40(L) 5.620.61 1.30(Q1.26(H)  Sodium 135 - 145 mmol/L 141 141 135  Potassium 3.5 - 5.1 mmol/L 4.3 4.3 4.1  Chloride 101 - 111 mmol/L 104 106 99(L)  CO2 22 - 32 mmol/L 32 31 28  Calcium 8.9 - 10.3 mg/dL 6.5(H8.8(L) 8.3(L) 8.0(L)  Total Protein 6.5 - 8.1 g/dL - - -  Total Bilirubin 0.3 - 1.2 mg/dL - - -  Alkaline Phos 38 - 126 U/L - - -  AST 15 - 41 U/L - - -  ALT 14 - 54 U/L - - -   Imaging studies: No new pertinent imaging studies   Assessment/Plan: 77 y.o. female with resolving post-surgical ileus 4 Days Post-Op s/p non-elective primary repair of incarcerated ventral hernia with small bowel resection and partial omentectomy, complicated by pertinent comorbidities including DM, HTN, cardiac arrhythmia, COPD on chronic home 3L supplemental oxygen via [place], hypothyroidism, and osteoarthritis.   - ambulation strongly encouraged  - pain control as needed (minimize narcotics)  - will advance to soft diet, advised to allow body to guide what patient eats   - medical management of comorbidities, DVT prophylaxix  - anticipate discharge home soon  All of the above findings and recommendations were discussed with the patient and patient's family, and all of patient's questions were answered to her expressed satisfaction.  -- Scherrie GerlachJason E. Earlene Plateravis, MD, RPVI [place]: [facility] General Surgery - Partnering for exceptional care. Office: [phone]

## 2018-04-18 NOTE — Progress Notes (Signed)
Inpatient Diabetes Program Recommendations  AACE/ADA: New Consensus Statement on Inpatient Glycemic Control (2019)  Target Ranges:  Prepandial:   less than 140 mg/dL      Peak postprandial:   less than 180 mg/dL (1-2 hours)      Critically ill patients:  140 - 180 mg/dL   Results for Kristen Schroeder, Kristen Schroeder (MRN [id number]) as of 04/18/2018 09:51  Ref. Range 04/17/2018 07:35 04/17/2018 11:34 04/17/2018 16:31 04/17/2018 16:52 04/17/2018 17:25 04/17/2018 22:39 04/18/2018 08:04  Glucose-Capillary Latest Ref Range: 65 - 99 mg/dL 83 84 61 (Schroeder) 66 [id number] (H) 82 89   Review of Glycemic Control  Diabetes history: DM2 Outpatient Diabetes medications: Metformin 1000 mg daily Current orders for Inpatient glycemic control: Novolog 0-15 units TID with meals, Novolog 0-5 units QHS, Novolog 4 units QHS  Inpatient Diabetes Program Recommendations:  Correction (SSI): Continue Novolog correction scale as ordered. Insulin - Meal Coverage: Please discontinue Novolog 4 units TID with meals which is for meal coverage.  Thanks, Orlando PennerMarie Dream Harman, RN, MSN, CDE Diabetes Coordinator Inpatient Diabetes Program [phone] (Team Pager from 8am to 5pm)

## 2018-04-18 NOTE — Progress Notes (Signed)
PT Cancellation Note  Patient Details Name: Kristen Schroeder MRN: [id number] DOB: [birth year]   Cancelled Treatment:    Reason Eval/Treat Not Completed: Patient at procedure or test/unavailable.  Chart reviewed.  Pt up walking to restroom with nursing upon PT arrival.  Pt asked PT to return later today.  Will re-attempt later if time allows.   Glenetta HewSarah Jenissa Tyrell, PT, DPT 04/18/2018, 2:46 PM

## 2018-04-19 LAB — GLUCOSE, CAPILLARY
Glucose-Capillary: 91 mg/dL (ref 65–99)
Glucose-Capillary: 112 mg/dL — ABNORMAL HIGH (ref 65–99)

## 2018-04-19 MED ORDER — OXYCODONE HCL 5 MG PO TABS: 5.0000 mg | ORAL_TABLET | ORAL | 0 refills | Status: DC | PRN

## 2018-04-19 NOTE — Progress Notes (Signed)
Nsg Discharge Note  Admit Date:  04/13/2018 Discharge date: 04/19/2018   Kristen Schroeder to be D/C'd Home per MD order.  AVS completed.  Copy for chart, and copy for patient signed, and dated. Patient/caregiver able to verbalize understanding.  Discharge Medication: Allergies as of 04/19/2018      Reactions   Codeine Nausea And Vomiting, Nausea Only      Medication List    TAKE these medications   arformoterol 15 MCG/2ML Nebu Commonly known as:  BROVANA Take 2 mLs (15 mcg total) by nebulization 2 (two) times daily.   aspirin EC 81 MG tablet Take 81 mg by mouth daily.   azithromycin 250 MG tablet Commonly known as:  ZITHROMAX Take 2 day 1 and then 1 daily   DALIRESP 500 MCG Tabs tablet Generic drug:  roflumilast Take 500 mcg by mouth daily.   fluticasone 50 MCG/ACT nasal spray Commonly known as:  FLONASE Place 1 spray into both nostrils daily as needed.   furosemide 20 MG tablet Commonly known as:  LASIX TAKE 1 TABLET BY MOUTH DAILY   gabapentin 300 MG capsule Commonly known as:  NEURONTIN 2 tablets in the morning, 1 tablet in the afternoon and 2 tablets in the evening   ipratropium-albuterol 0.5-2.5 (3) MG/3ML Soln Commonly known as:  DUONEB Take 3 mLs by nebulization every 6 (six) hours as needed.   isosorbide mononitrate 30 MG 24 hr tablet Commonly known as:  IMDUR TAKE 1 TABLET (30 MG TOTAL) BY MOUTH ONCE DAILY.   levothyroxine 125 MCG tablet Commonly known as:  SYNTHROID, LEVOTHROID TAKE 1 TABLET BY MOUTH DAILY   lisinopril 20 MG tablet Commonly known as:  PRINIVIL,ZESTRIL Take 1 tablet (20 mg total) by mouth daily.   loratadine 10 MG tablet Commonly known as:  CLARITIN TAKE 1 TABLET (10 MG TOTAL) BY MOUTH DAILY.   LORazepam 0.5 MG tablet Commonly known as:  ATIVAN TAKE 1 TABLET BY MOUTH 2 TIMES DAILY AS NEEDED FOR ANXIETY   metFORMIN 1000 MG tablet Commonly known as:  GLUCOPHAGE TAKE 1 TABLET BY MOUTH TWICE A DAY What changed:    how much to  take  how to take this  when to take this   mometasone-formoterol 100-5 MCG/ACT Aero Commonly known as:  DULERA Inhale 2 puffs into the lungs 2 (two) times daily.   MULTIPLE VITAMIN PO Take 1 tablet by mouth daily.   omeprazole 40 MG capsule Commonly known as:  PRILOSEC Take 1 capsule (40 mg total) by mouth daily.   ondansetron 4 MG disintegrating tablet Commonly known as:  ZOFRAN-ODT TAKE 1 TABLET BY MOUTH EVERY 8 HOURS AS NEEDED FOR NAUSEA AND VOMITING   ONE TOUCH ULTRA TEST test strip Generic drug:  glucose blood USE 1 STRIP 2 TIMES DAILY AND AS NEEDED   oxyCODONE 5 MG immediate release tablet Commonly known as:  Oxy IR/ROXICODONE Take 1 tablet (5 mg total) by mouth every 4 (four) hours as needed for severe pain.   PARoxetine 10 MG tablet Commonly known as:  PAXIL Take 1 tablet (10 mg total) at bedtime by mouth.   pravastatin 10 MG tablet Commonly known as:  PRAVACHOL TAKE 1 TABLET (10 MG TOTAL) BY MOUTH DAILY.   salmeterol 50 MCG/DOSE diskus inhaler Commonly known as:  SEREVENT Inhale 1 puff into the lungs daily.            Durable Medical Equipment  (From admission, onward)        Start  Ordered   04/19/18 1300  For home use only DME Walker rolling  Once    Question:  Patient needs a walker to treat with the following condition  Answer:  Difficulty walking   04/19/18 1303      Discharge Assessment: Vitals:   04/19/18 0755 04/19/18 1130  BP:  (!) 133/53  Pulse:  82  Resp:    Temp:  98.1 F (36.7 C)  SpO2: 98% 98%   Skin clean, dry and intact without evidence of skin break down, no evidence of skin tears noted. IV catheter discontinued intact. Site without signs and symptoms of complications - no redness or edema noted at insertion site, patient denies c/o pain - only slight tenderness at site.  Dressing with slight pressure applied.  D/c Instructions-Education: Discharge instructions given to patient/family with verbalized  understanding. D/c education completed with patient/family including follow up instructions, medication list, d/c activities limitations if indicated, with other d/c instructions as indicated by MD - patient able to verbalize understanding, all questions fully answered. Patient instructed to return to ED, call 911, or call MD for any changes in condition.  Patient escorted via WC, and D/C home via private auto with husband.  Adair LaundryElizabeth A Noella Kipnis, RN 04/19/2018 1:40 PM

## 2018-04-19 NOTE — Care Management (Signed)
RNCM met with patient to discuss transition of care to home today as she declined SNF. [facility]erral to [facility] previously made and patient still agrees. MD updated; orders received and placed for Dr. Rosana Hoes signature. Rolling walker requested from [place] with [facility].

## 2018-04-19 NOTE — Discharge Instructions (Signed)
In addition to included general post-operative instructions for Emergent Suture Repair of Abdominal Hernia with Small Bowel Resection,  Diet: Resume home heart healthy diet.   Activity: No heavy lifting >15 - 20 pounds (children, pets, laundry, garbage) or strenuous activity until follow-up, but light activity and walking are encouraged. Do not drive or drink alcohol if taking narcotic pain medications.  Wound care: Remove drain dressing in 2 days. Once dressing removed, 2 days after surgery (Monday, 6/17), you may shower/get incision wet with soapy water and pat dry (do not rub incisions), but no baths or submerging incision underwater until follow-up.   Medications: Resume all home medications. For mild to moderate pain: acetaminophen (Tylenol) or ibuprofen/naproxen (if no kidney disease). Combining Tylenol with alcohol can substantially increase your risk of causing liver disease. Narcotic pain medications, if prescribed, can be used for severe pain, though may cause nausea, constipation, and drowsiness. Do not combine Tylenol and Percocet (or similar) within a 6 hour period as Percocet (and similar) contain(s) Tylenol. If you do not need the narcotic pain medication, you do not need to fill the prescription.  Call office [phone]) at any time if any questions, worsening pain, fevers/chills, bleeding, drainage from incision site, or other concerns.

## 2018-04-21 ENCOUNTER — Encounter: Payer: Self-pay | Admitting: Emergency Medicine

## 2018-04-21 ENCOUNTER — Emergency Department: Payer: Medicare Other

## 2018-04-21 ENCOUNTER — Inpatient Hospital Stay
Admission: EM | Admit: 2018-04-21 | Discharge: 2018-04-29 | DRG: 390 | Disposition: A | Discharge status: Home Health | Payer: Medicare Other | Attending: Surgery | Admitting: Surgery

## 2018-04-21 ENCOUNTER — Other Ambulatory Visit: Payer: Self-pay

## 2018-04-21 DIAGNOSIS — Z7951 Long term (current) use of inhaled steroids: Secondary | ICD-10-CM

## 2018-04-21 DIAGNOSIS — K9131 Postprocedural partial intestinal obstruction: Principal | ICD-10-CM | POA: Diagnosis present

## 2018-04-21 DIAGNOSIS — Z8249 Family history of ischemic heart disease and other diseases of the circulatory system: Secondary | ICD-10-CM

## 2018-04-21 DIAGNOSIS — K219 Gastro-esophageal reflux disease without esophagitis: Secondary | ICD-10-CM | POA: Diagnosis present

## 2018-04-21 DIAGNOSIS — Z4682 Encounter for fitting and adjustment of non-vascular catheter: Secondary | ICD-10-CM | POA: Diagnosis not present

## 2018-04-21 DIAGNOSIS — Z90710 Acquired absence of both cervix and uterus: Secondary | ICD-10-CM

## 2018-04-21 DIAGNOSIS — J449 Chronic obstructive pulmonary disease, unspecified: Secondary | ICD-10-CM | POA: Diagnosis not present

## 2018-04-21 DIAGNOSIS — Z9981 Dependence on supplemental oxygen: Secondary | ICD-10-CM

## 2018-04-21 DIAGNOSIS — K56609 Unspecified intestinal obstruction, unspecified as to partial versus complete obstruction: Secondary | ICD-10-CM | POA: Diagnosis not present

## 2018-04-21 DIAGNOSIS — M5137 Other intervertebral disc degeneration, lumbosacral region: Secondary | ICD-10-CM | POA: Diagnosis present

## 2018-04-21 DIAGNOSIS — Z9842 Cataract extraction status, left eye: Secondary | ICD-10-CM

## 2018-04-21 DIAGNOSIS — Z79890 Hormone replacement therapy (postmenopausal): Secondary | ICD-10-CM

## 2018-04-21 DIAGNOSIS — Z961 Presence of intraocular lens: Secondary | ICD-10-CM | POA: Diagnosis present

## 2018-04-21 DIAGNOSIS — Z7982 Long term (current) use of aspirin: Secondary | ICD-10-CM

## 2018-04-21 DIAGNOSIS — E039 Hypothyroidism, unspecified: Secondary | ICD-10-CM | POA: Diagnosis not present

## 2018-04-21 DIAGNOSIS — K56699 Other intestinal obstruction unspecified as to partial versus complete obstruction: Secondary | ICD-10-CM | POA: Diagnosis not present

## 2018-04-21 DIAGNOSIS — I10 Essential (primary) hypertension: Secondary | ICD-10-CM | POA: Diagnosis present

## 2018-04-21 DIAGNOSIS — Z87891 Personal history of nicotine dependence: Secondary | ICD-10-CM | POA: Diagnosis not present

## 2018-04-21 DIAGNOSIS — E1140 Type 2 diabetes mellitus with diabetic neuropathy, unspecified: Secondary | ICD-10-CM | POA: Diagnosis not present

## 2018-04-21 DIAGNOSIS — Z0189 Encounter for other specified special examinations: Secondary | ICD-10-CM

## 2018-04-21 DIAGNOSIS — E785 Hyperlipidemia, unspecified: Secondary | ICD-10-CM | POA: Diagnosis present

## 2018-04-21 DIAGNOSIS — M1990 Unspecified osteoarthritis, unspecified site: Secondary | ICD-10-CM | POA: Diagnosis present

## 2018-04-21 LAB — COMPREHENSIVE METABOLIC PANEL
ALT: 11 U/L — ABNORMAL LOW (ref 14–54)
AST: 28 U/L (ref 15–41)
Albumin: 3.4 g/dL — ABNORMAL LOW (ref 3.5–5.0)
Alkaline Phosphatase: 76 U/L (ref 38–126)
Anion gap: 13 (ref 5–15)
BUN: 9 mg/dL (ref 6–20)
CO2: 31 mmol/L (ref 22–32)
Calcium: 8.9 mg/dL (ref 8.9–10.3)
Chloride: 97 mmol/L — ABNORMAL LOW (ref 101–111)
Creatinine, Ser: 0.46 mg/dL (ref 0.44–1.00)
GFR calc Af Amer: >60 mL/min (ref >60)
GFR calc non Af Amer: >60 mL/min (ref >60)
Glucose, Bld: 113 mg/dL — ABNORMAL HIGH (ref 65–99)
Potassium: 3.8 mmol/L (ref 3.5–5.1)
Sodium: 141 mmol/L (ref 135–145)
Total Bilirubin: 1 mg/dL (ref 0.3–1.2)
Total Protein: 6.7 g/dL (ref 6.5–8.1)

## 2018-04-21 LAB — CBC WITH DIFFERENTIAL/PLATELET
Basophils Absolute: 0 10*3/uL (ref 0–0.1)
Basophils Relative: 0 %
Eosinophils Absolute: 0.2 10*3/uL (ref 0–0.7)
Eosinophils Relative: 1 %
HCT: 31.9 % — ABNORMAL LOW (ref 35.0–47.0)
Hemoglobin: 10.4 g/dL — ABNORMAL LOW (ref 12.0–16.0)
Lymphocytes Relative: 4 %
Lymphs Abs: 0.6 10*3/uL — ABNORMAL LOW (ref 1.0–3.6)
MCH: 29 pg (ref 26.0–34.0)
MCHC: 32.5 g/dL (ref 32.0–36.0)
MCV: 89 fL (ref 80.0–100.0)
Monocytes Absolute: 1.1 10*3/uL — ABNORMAL HIGH (ref 0.2–0.9)
Monocytes Relative: 8 %
Neutro Abs: 12 10*3/uL — ABNORMAL HIGH (ref 1.4–6.5)
Neutrophils Relative %: 87 %
Platelets: 288 10*3/uL (ref 150–440)
RBC: 3.58 MIL/uL — ABNORMAL LOW (ref 3.80–5.20)
RDW: 15.4 % — ABNORMAL HIGH (ref 11.5–14.5)
WBC: 13.9 10*3/uL — ABNORMAL HIGH (ref 3.6–11.0)

## 2018-04-21 LAB — LIPASE, BLOOD: Lipase: 21 U/L (ref 11–51)

## 2018-04-21 MED ORDER — FENTANYL CITRATE (PF) 100 MCG/2ML IJ SOLN
100.0000 ug | Freq: Once | INTRAMUSCULAR | Status: AC
  Administered 2018-04-21: 100 ug via INTRAVENOUS
  Filled 2018-04-21: qty 2

## 2018-04-21 MED ORDER — IOPAMIDOL (ISOVUE-300) INJECTION 61%
100.0000 mL | Freq: Once | INTRAVENOUS | Status: AC | PRN
  Administered 2018-04-21: 100 mL via INTRAVENOUS

## 2018-04-21 MED ORDER — ONDANSETRON HCL 4 MG/2ML IJ SOLN
4.0000 mg | Freq: Once | INTRAMUSCULAR | Status: AC
Start: 2018-04-21 — End: 2018-04-21
  Administered 2018-04-21: 4 mg via INTRAVENOUS
  Filled 2018-04-21: qty 2

## 2018-04-21 MED ORDER — SODIUM CHLORIDE 0.9 % IV BOLUS
1000.0000 mL | Freq: Once | INTRAVENOUS | Status: AC
  Administered 2018-04-21: 1000 mL via INTRAVENOUS

## 2018-04-21 NOTE — ED Notes (Signed)
Patient transported to CT 

## 2018-04-21 NOTE — ED Triage Notes (Signed)
Pt to triage via w/c with no distress noted; pt c/o lower abd pain accomp by nausea since yesterday hernia surgery on Sunday

## 2018-04-21 NOTE — ED Provider Notes (Signed)
[facility] Emergency Department Provider Note  ____________________________________________  Time seen: Approximately 9:42 PM  I have reviewed the triage vital signs and the nursing notes.   HISTORY  Chief Complaint Post-op Problem   HPI Anastasia DONELDA MAILHOT is a 77 y.o. female POD 8 from a near repair due to an incarcerated hernia, COPD on 4 L nasal cannula, hypertension, diabetes who presents for evaluation of abdominal pain.  Patient reports that the pain started yesterday.  The pain was initially intermittent but has become constant today.  She describes the pain as a cramp, located in the left side of her abdomen, constant and nonradiating.  The pain is 8 out of 10.  Yesterday she reports taking a hydrocodone and had 3 episodes of nonbloody nonbilious emesis afterward.  Today she was able to tolerate p.o. with no further episodes of vomiting.  She has had chills but no fever.  She is having normal bowel movements, she is passing flatus, she denies abdominal distention.  She denies dysuria or hematuria.  She denies chest pain or shortness of breath that is unchanged from baseline.  Past Medical History:  Diagnosis Date  . Arthritis   . COPD (chronic obstructive pulmonary disease) (HCC)   . Diabetes mellitus without complication (HCC)   . Dysrhythmia   . Heart murmur   . History of orthopnea   . Hypertension   . Hypothyroidism   . Neuropathy   . Oxygen dependent    3L  CONTINUOUS  . Pain CHRONIC BACK PAIN  . Shortness of breath dyspnea   . Wheezing     Patient Active Problem List   Diagnosis Date Noted  . Incarcerated hernia 04/14/2018  . Incarcerated hernia of abdominal cavity   . Aneurysm (HCC) 07/10/2016  . Nonspecific abnormal finding 05/11/2015  . Allergic rhinitis 05/11/2015  . Anxiety 05/11/2015  . Bronchitis, chronic (HCC) 05/11/2015  . CCF (congestive cardiac failure) (HCC) 05/11/2015  . Clinical depression 05/11/2015  . DDD  (degenerative disc disease), 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/11/2015  . Essential (primary) hypertension 05/11/2015  . Acid reflux 05/11/2015  . HLD (hyperlipidemia) 05/11/2015  . Adult hypothyroidism 05/11/2015  . Cervical dysplasia, mild 05/11/2015  . Neuropathy 05/11/2015  . Adiposity 05/11/2015  . Obstructive apnea 05/11/2015  . Arthritis, degenerative 05/11/2015    Past Surgical History:  Procedure Laterality Date  . ABDOMINAL HYSTERECTOMY    . BOWEL RESECTION N/A 04/14/2018   Procedure: SMALL BOWEL RESECTION;  Surgeon: Leafy Ro, MD;  Location: [facility] ORS;  Service: General;  Laterality: N/A;  . CATARACT EXTRACTION W/PHACO Left 07/03/2016   Procedure: CATARACT EXTRACTION PHACO AND INTRAOCULAR LENS PLACEMENT (IOC);  Surgeon: Galen Manila, MD;  Location: [facility] ORS;  Service: Ophthalmology;  Laterality: Left;  Lot: [id number] H [place]: 00:40.1 AP%: 17.4 CDE:6.94  . HERNIA REPAIR    . TUBAL LIGATION    . VENTRAL HERNIA REPAIR N/A 04/14/2018   Procedure: HERNIA REPAIR VENTRAL ADULT;  Surgeon: Leafy Ro, MD;  Location: [facility] ORS;  Service: General;  Laterality: N/A;    Prior to Admission medications   Medication Sig Start Date End Date Taking? Authorizing Provider  arformoterol (BROVANA) 15 MCG/2ML NEBU Take 2 mLs (15 mcg total) by nebulization 2 (two) times daily. 03/04/18  Yes Maple Hudson., MD  aspirin EC 81 MG tablet Take 81 mg by mouth daily.   Yes [provider]  DALIRESP 500 MCG TABS tablet Take 500 mcg by mouth daily.    Yes [provider]  fluticasone (FLONASE) 50 MCG/ACT nasal spray Place 1 spray into both nostrils daily as needed.    Yes [provider]  furosemide (LASIX) 20 MG tablet TAKE 1 TABLET BY MOUTH DAILY 02/10/18  Yes Maple Hudson., MD  gabapentin (NEURONTIN) 300 MG capsule 2 tablets in the morning, 1 tablet in the afternoon and 2 tablets in the evening 12/04/17  Yes Maple Hudson., MD  ipratropium-albuterol (DUONEB) 0.5-2.5  (3) MG/3ML SOLN Take 3 mLs by nebulization every 6 (six) hours as needed. 03/20/18  Yes Lyndon Code, MD  isosorbide mononitrate (IMDUR) 30 MG 24 hr tablet Take 30 mg by mouth daily as needed (high bp).   Yes [provider]  levothyroxine (SYNTHROID, LEVOTHROID) 125 MCG tablet TAKE 1 TABLET BY MOUTH DAILY 05/20/17  Yes Maple Hudson., MD  lisinopril (PRINIVIL,ZESTRIL) 20 MG tablet Take 1 tablet (20 mg total) by mouth daily. 08/26/17  Yes Maple Hudson., MD  loratadine (CLARITIN) 10 MG tablet TAKE 1 TABLET (10 MG TOTAL) BY MOUTH DAILY. 03/22/17  Yes Maple Hudson., MD  LORazepam (ATIVAN) 0.5 MG tablet TAKE 1 TABLET BY MOUTH 2 TIMES DAILY AS NEEDED FOR ANXIETY 01/29/18  Yes Maple Hudson., MD  metFORMIN (GLUCOPHAGE) 1000 MG tablet TAKE 1 TABLET BY MOUTH TWICE A DAY Patient taking differently: TAKE 1 TABLET BY MOUTH ONCE A DAY 07/12/17  Yes Maple Hudson., MD  mometasone-formoterol [facility]) 100-5 MCG/ACT AERO Inhale 2 puffs into the lungs 2 (two) times daily. 03/20/18  Yes Lyndon Code, MD  MULTIPLE VITAMIN PO Take 1 tablet by mouth daily.    Yes [provider]  omeprazole (PRILOSEC) 40 MG capsule Take 1 capsule (40 mg total) by mouth daily. 02/13/17  Yes Maple Hudson., MD  ondansetron (ZOFRAN-ODT) 4 MG disintegrating tablet TAKE 1 TABLET BY MOUTH EVERY 8 HOURS AS NEEDED FOR NAUSEA AND VOMITING 06/13/17  Yes Margaretann Loveless, PA-C  oxyCODONE (OXY IR/ROXICODONE) 5 MG immediate release tablet Take 1 tablet (5 mg total) by mouth every 4 (four) hours as needed for severe pain. 04/19/18  Yes Ancil Linsey, MD  PARoxetine (PAXIL) 10 MG tablet Take 1 tablet (10 mg total) at bedtime by mouth. 09/13/17  Yes Maple Hudson., MD  pravastatin (PRAVACHOL) 10 MG tablet TAKE 1 TABLET (10 MG TOTAL) BY MOUTH DAILY. 05/20/17  Yes Maple Hudson., MD  salmeterol (SEREVENT) 50 MCG/DOSE diskus inhaler Inhale 1 puff into the lungs daily. 03/11/18   Yes Maple Hudson., MD  azithromycin [facility]) 250 MG tablet Take 2 day 1 and then 1 daily Patient not taking: Reported on 04/21/2018 04/10/18   Maple Hudson., MD  ONE [facility] ULTRA TEST test strip USE 1 STRIP 2 TIMES DAILY AND AS NEEDED 06/28/16   Maple Hudson., MD    Allergies Codeine  Family History  Problem Relation Age of Onset  . Heart disease Mother   . Drug abuse Other   . Hypertension Other     Social History Social History   Tobacco Use  . Smoking status: Former Smoker    Packs/day: 0.50    Years: 15.00    Pack years: 7.50  . Smokeless tobacco: Never Used  . Tobacco comment: 30-40 years ago  Substance Use Topics  . Alcohol use: No    Alcohol/week: 0.0 oz  . Drug use: No    Review of Systems  Constitutional: Negative for fever. Eyes: Negative for visual changes. ENT: Negative for sore throat. Neck: No neck pain  Cardiovascular: Negative for chest pain. Respiratory: Negative for shortness of breath. Gastrointestinal: + abdominal pain and vomiting. No diarrhea. Genitourinary: Negative for dysuria. Musculoskeletal: Negative for back pain. Skin: Negative for rash. Neurological: Negative for headaches, weakness or numbness. Psych: No SI or HI  ____________________________________________   PHYSICAL EXAM:  VITAL SIGNS: ED Triage Vitals  Enc Vitals Group     BP 04/21/18 2109 (!) 128/95     Pulse Rate 04/21/18 2109 (!) 101     Resp 04/21/18 2109 18     Temp 04/21/18 2109 98 F (36.7 C)     Temp Source 04/21/18 2109 Oral     SpO2 04/21/18 2109 97 %     Weight 04/21/18 2110 222 lb (100.7 kg)     Height 04/21/18 2110 5\' 8"  (1.727 m)     Head Circumference --      Peak Flow --      Pain Score 04/21/18 2110 7     Pain Loc --      Pain Edu? --      Excl. in GC? --     Constitutional: Alert and oriented. Well appearing and in no apparent distress. HEENT:      Head: Normocephalic and atraumatic.         Eyes: Conjunctivae are  normal. Sclera is non-icteric.       Mouth/Throat: Mucous membranes are moist.       Neck: Supple with no signs of meningismus. Cardiovascular: Tachycardic with regular rhythm. No murmurs, gallops, or rubs. 2+ symmetrical distal pulses are present in all extremities. No JVD. Respiratory: Normal respiratory effort. Lungs are clear to auscultation bilaterally. No wheezes, crackles, or rhonchi.  Gastrointestinal: Obese soft with ttp over the left side, surgical site located in the LLQ looks well healing with staples in place, no erythema or purulent discharge, positive bowel sounds. No rebound or guarding. Musculoskeletal: Nontender with normal range of motion in all extremities. No edema, cyanosis, or erythema of extremities. Neurologic: Normal speech and language. Face is symmetric. Moving all extremities. No gross focal neurologic deficits are appreciated. Skin: Skin is warm, dry and intact. No rash noted. Psychiatric: Mood and affect are normal. Speech and behavior are normal.  ____________________________________________   LABS (all labs ordered are listed, but only abnormal results are displayed)  Labs Reviewed  CBC WITH DIFFERENTIAL/PLATELET - Abnormal; Notable for the following components:      Result Value   WBC 13.9 (*)    RBC 3.58 (*)    Hemoglobin 10.4 (*)    HCT 31.9 (*)    RDW 15.4 (*)    Neutro Abs 12.0 (*)    Lymphs Abs 0.6 (*)    Monocytes Absolute 1.1 (*)    All other components within normal limits  COMPREHENSIVE METABOLIC PANEL - Abnormal; Notable for the following components:   Chloride 97 (*)    Glucose, Bld 113 (*)    Albumin 3.4 (*)    ALT 11 (*)    All other components within normal limits  LIPASE, BLOOD  URINALYSIS, COMPLETE (UACMP) WITH MICROSCOPIC   ____________________________________________  EKG  none  ____________________________________________  RADIOLOGY  I have personally reviewed the images performed during this visit and I agree with the  Radiologist's read.   Interpretation by Radiologist:  Ct Abdomen Pelvis W Contrast  Result Date: 04/21/2018 CLINICAL DATA:  77 year old female with lower abdominal  pain and nausea following recent surgery on 04/14/2018 for incarcerated ventral small-bowel hernia. EXAM: CT ABDOMEN AND PELVIS WITH CONTRAST TECHNIQUE: Multidetector CT imaging of the abdomen and pelvis was performed using the standard protocol following bolus administration of intravenous contrast. CONTRAST:  ISOVUE-300 IOPAMIDOL (ISOVUE-300) INJECTION 61% COMPARISON:  Preoperative CT Abdomen and Pelvis 04/13/2018. FINDINGS: Lower chest: Stable 6-7 mm left lower lobe subpleural nodule since 06/14/2017, likely benign. Bilateral lower lobe bronchiectasis and mild scarring. No acute pulmonary opacity, pleural or pericardial effusion. Hepatobiliary: Trace perihepatic free fluid. Otherwise negative liver. 20 mm gallstone redemonstrated in the gallbladder neck. No pericholecystic inflammation. Pancreas: Negative. Spleen: Negative. Adrenals/Urinary Tract: Stable and negative adrenal glands. Bilateral renal enhancement and contrast excretion is stable and within normal limits. Normal course of the ureters to the urinary bladder. Diminutive and unremarkable urinary bladder. Stomach/Bowel: Negative rectum aside from mild retained stool. Decompressed sigmoid colon. Proximal sigmoid diverticulosis. Intermediate density free fluid abutting the sigmoid colon and tracking into the pelvis. Diverticulosis continues into the left colon which is decompressed. The transverse colon is redundant but largely decompressed. Mildly redundant ascending colon is otherwise negative. The terminal ileum is decompressed and negative. There are decompressed small bowel loops throughout the right lower abdomen. The stomach and duodenum are moderately to severely distended with fluid. Similar distension of the jejunum with fluid-filled loops up to 5 centimeters diameter.  There is an abrupt transition point adjacent to what appears to be a side to side small bowel anastomosis in the left lower quadrant. See series 2, images 65 through 70 and sagittal image 106. There is no extraluminal gas in the abdomen. The dominant fat containing left paracentral small bowel hernia was repaired since the prior CT. A smaller mesenteric fat containing right paracentral hernia is stable (series 2, image 72 and sagittal images 87 through 77). Widespread ventral abdominal wall soft tissue thickening and stranding appears to be postoperative in nature. No organized abdominal wall fluid collection. Skin staples remain in place. Vascular/Lymphatic: Calcified aortic atherosclerosis. The major arterial structures in the abdomen and pelvis are patent. Reproductive: Surgically absent. Other: Intermediate density free fluid in the pelvis. Musculoskeletal: Stable visualized osseous structures. IMPRESSION: 1. High-grade Small Bowel Obstruction with transition point at the postoperative small bowel anastomosis in the left lower quadrant. Subsequent moderate to severe dilatation of upstream jejunum, the stomach and duodenum. 2. No pneumoperitoneum to suggest perforation. A small volume lower abdominal and pelvic hemoperitoneum is likely postoperative in nature. A trace amount of upper abdominal free fluid may be reactive due to #1. 3. Extensive ventral abdominal wall inflammation also appears to be postoperative. No organized fluid collection. 4. Interval repair of the dominant fat and bowel containing ventral abdominal hernia. Stable smaller right paracentral fat containing hernia (series 2, image 72). 5. Cholelithiasis. Aortic Atherosclerosis (ICD10-I70.0). Lower lobe bronchiectasis. Electronically Signed   By: Odessa Fleming M.D.   On: 04/21/2018 22:48      ____________________________________________   PROCEDURES  Procedure(s) performed: None Procedures Critical Care performed:   None ____________________________________________   INITIAL IMPRESSION / ASSESSMENT AND PLAN / ED COURSE   77 y.o. female POD 8 from a near repair due to an incarcerated hernia, COPD on 4 L nasal cannula, hypertension, diabetes who presents for evaluation of abdominal pain.  Patient is tender to palpation on the left abdominal quadrants, no rebound or guarding, the surgical scar is located in the left lower quadrant and he looks well healing with staples in place, no purulent discharge or overlying  cellulitis, no crepitus.  She has positive bowel sounds.  Differential diagnosis is broad and includes but not limited to intra-abdominal infection or postop complication such as seroma or abscess, SBO, diverticulitis.  We will give her fentanyl and Zofran for her symptoms.  I will check labs and send patient for CT abdomen pelvis.    _________________________ 11:15 PM on 04/21/2018 -----------------------------------------  CT concerning for high grade SBO. Will place NG tube, NPO, IVF and admit to surgery.   As part of my medical decision making, I reviewed the following data within the electronic MEDICAL RECORD NUMBER Nursing notes reviewed and incorporated, Labs reviewed , Old chart reviewed, Radiograph reviewed , A consult was requested and obtained from this/these consultant(s) Surgery, Notes from prior ED visits and [place] Controlled Substance Database    Pertinent labs & imaging results that were available during my care of the patient were reviewed by me and considered in my medical decision making (see chart for details).    ____________________________________________   FINAL CLINICAL IMPRESSION(S) / ED DIAGNOSES  Final diagnoses:  SBO (small bowel obstruction) (HCC)      NEW MEDICATIONS STARTED DURING THIS VISIT:  ED Discharge Orders    None       Note:  This document was prepared using Dragon voice recognition software and may include unintentional dictation errors.     Don Perking, [place], MD 04/21/18 [phone]

## 2018-04-21 NOTE — ED Notes (Signed)
Pt had an episode of vomiting, approx bilious fluid. MD made aware.

## 2018-04-22 ENCOUNTER — Other Ambulatory Visit: Payer: Self-pay

## 2018-04-22 ENCOUNTER — Inpatient Hospital Stay: Payer: Medicare Other

## 2018-04-22 DIAGNOSIS — J449 Chronic obstructive pulmonary disease, unspecified: Secondary | ICD-10-CM | POA: Diagnosis present

## 2018-04-22 DIAGNOSIS — I10 Essential (primary) hypertension: Secondary | ICD-10-CM | POA: Diagnosis present

## 2018-04-22 DIAGNOSIS — Z87891 Personal history of nicotine dependence: Secondary | ICD-10-CM | POA: Diagnosis not present

## 2018-04-22 DIAGNOSIS — Z9981 Dependence on supplemental oxygen: Secondary | ICD-10-CM | POA: Diagnosis not present

## 2018-04-22 DIAGNOSIS — E1140 Type 2 diabetes mellitus with diabetic neuropathy, unspecified: Secondary | ICD-10-CM | POA: Diagnosis present

## 2018-04-22 DIAGNOSIS — K56609 Unspecified intestinal obstruction, unspecified as to partial versus complete obstruction: Secondary | ICD-10-CM | POA: Diagnosis present

## 2018-04-22 DIAGNOSIS — E785 Hyperlipidemia, unspecified: Secondary | ICD-10-CM | POA: Diagnosis present

## 2018-04-22 DIAGNOSIS — E039 Hypothyroidism, unspecified: Secondary | ICD-10-CM | POA: Diagnosis present

## 2018-04-22 DIAGNOSIS — Z9842 Cataract extraction status, left eye: Secondary | ICD-10-CM | POA: Diagnosis not present

## 2018-04-22 DIAGNOSIS — Z7951 Long term (current) use of inhaled steroids: Secondary | ICD-10-CM | POA: Diagnosis not present

## 2018-04-22 DIAGNOSIS — M1990 Unspecified osteoarthritis, unspecified site: Secondary | ICD-10-CM | POA: Diagnosis present

## 2018-04-22 DIAGNOSIS — K9131 Postprocedural partial intestinal obstruction: Secondary | ICD-10-CM | POA: Diagnosis present

## 2018-04-22 DIAGNOSIS — Z8249 Family history of ischemic heart disease and other diseases of the circulatory system: Secondary | ICD-10-CM | POA: Diagnosis not present

## 2018-04-22 DIAGNOSIS — Z90710 Acquired absence of both cervix and uterus: Secondary | ICD-10-CM | POA: Diagnosis not present

## 2018-04-22 DIAGNOSIS — K219 Gastro-esophageal reflux disease without esophagitis: Secondary | ICD-10-CM | POA: Diagnosis present

## 2018-04-22 DIAGNOSIS — Z961 Presence of intraocular lens: Secondary | ICD-10-CM | POA: Diagnosis present

## 2018-04-22 DIAGNOSIS — M5137 Other intervertebral disc degeneration, lumbosacral region: Secondary | ICD-10-CM | POA: Diagnosis present

## 2018-04-22 DIAGNOSIS — Z79890 Hormone replacement therapy (postmenopausal): Secondary | ICD-10-CM | POA: Diagnosis not present

## 2018-04-22 DIAGNOSIS — Z7982 Long term (current) use of aspirin: Secondary | ICD-10-CM | POA: Diagnosis not present

## 2018-04-22 LAB — URINALYSIS, COMPLETE (UACMP) WITH MICROSCOPIC
Bacteria, UA: NONE SEEN
Bilirubin Urine: NEGATIVE
Glucose, UA: NEGATIVE mg/dL
Hgb urine dipstick: NEGATIVE
Ketones, ur: 80 mg/dL — AB
Leukocytes, UA: NEGATIVE
Nitrite: NEGATIVE
Protein, ur: 30 mg/dL — AB
Specific Gravity, Urine: >1.046 — ABNORMAL HIGH (ref 1.005–1.030)
pH: 6 (ref 5.0–8.0)

## 2018-04-22 LAB — CBC
HCT: 31.5 % — ABNORMAL LOW (ref 35.0–47.0)
Hemoglobin: 10.2 g/dL — ABNORMAL LOW (ref 12.0–16.0)
MCH: 28.9 pg (ref 26.0–34.0)
MCHC: 32.3 g/dL (ref 32.0–36.0)
MCV: 89.7 fL (ref 80.0–100.0)
Platelets: 275 10*3/uL (ref 150–440)
RBC: 3.51 MIL/uL — ABNORMAL LOW (ref 3.80–5.20)
RDW: 15.4 % — ABNORMAL HIGH (ref 11.5–14.5)
WBC: 11.3 10*3/uL — ABNORMAL HIGH (ref 3.6–11.0)

## 2018-04-22 LAB — CREATININE, SERUM
Creatinine, Ser: 0.34 mg/dL — ABNORMAL LOW (ref 0.44–1.00)
GFR calc Af Amer: >60 mL/min (ref >60)
GFR calc non Af Amer: >60 mL/min (ref >60)

## 2018-04-22 MED ORDER — PAROXETINE HCL 10 MG PO TABS
10.0000 mg | ORAL_TABLET | Freq: Every day | ORAL | Status: DC
  Administered 2018-04-25 – 2018-04-28 (×4): 10 mg via ORAL
  Filled 2018-04-22 (×8): qty 1

## 2018-04-22 MED ORDER — KCL IN DEXTROSE-NACL 20-5-0.45 MEQ/L-%-% IV SOLN
INTRAVENOUS | Status: DC
  Administered 2018-04-22 – 2018-04-24 (×6): via INTRAVENOUS
  Filled 2018-04-22 (×10): qty 1000

## 2018-04-22 MED ORDER — IPRATROPIUM-ALBUTEROL 0.5-2.5 (3) MG/3ML IN SOLN: 3.0000 mL | Freq: Four times a day (QID) | RESPIRATORY_TRACT | Status: DC | PRN

## 2018-04-22 MED ORDER — HEPARIN SODIUM (PORCINE) 5000 UNIT/ML IJ SOLN
5000.0000 [IU] | Freq: Three times a day (TID) | INTRAMUSCULAR | Status: DC
  Administered 2018-04-22 – 2018-04-23 (×6): 5000 [IU] via SUBCUTANEOUS
  Filled 2018-04-22 (×6): qty 1

## 2018-04-22 MED ORDER — HYDRALAZINE HCL 20 MG/ML IJ SOLN: 5.0000 mg | Freq: Four times a day (QID) | INTRAMUSCULAR | Status: DC | PRN

## 2018-04-22 MED ORDER — LEVOTHYROXINE SODIUM 50 MCG PO TABS
125.0000 ug | ORAL_TABLET | Freq: Every day | ORAL | Status: DC
  Administered 2018-04-26 – 2018-04-29 (×4): 125 ug via ORAL
  Filled 2018-04-22 (×5): qty 3

## 2018-04-22 MED ORDER — HYDROMORPHONE HCL 1 MG/ML IJ SOLN
0.5000 mg | INTRAMUSCULAR | Status: DC | PRN
  Administered 2018-04-22 – 2018-04-24 (×3): 0.5 mg via INTRAVENOUS
  Filled 2018-04-22 (×3): qty 0.5

## 2018-04-22 MED ORDER — FUROSEMIDE 40 MG PO TABS: 20.0000 mg | ORAL_TABLET | Freq: Every day | ORAL | Status: DC

## 2018-04-22 MED ORDER — FLUTICASONE PROPIONATE 50 MCG/ACT NA SUSP
1.0000 | Freq: Every day | NASAL | Status: DC | PRN
  Filled 2018-04-22: qty 16

## 2018-04-22 MED ORDER — GABAPENTIN 100 MG PO CAPS
200.0000 mg | ORAL_CAPSULE | ORAL | Status: DC
  Administered 2018-04-25 – 2018-04-29 (×5): 200 mg via ORAL
  Filled 2018-04-22 (×6): qty 2

## 2018-04-22 MED ORDER — ROFLUMILAST 500 MCG PO TABS
500.0000 ug | ORAL_TABLET | Freq: Every day | ORAL | Status: DC
  Administered 2018-04-26 – 2018-04-29 (×4): 500 ug via ORAL
  Filled 2018-04-22 (×8): qty 1

## 2018-04-22 MED ORDER — PHENOL 1.4 % MT LIQD
1.0000 | OROMUCOSAL | Status: DC | PRN
  Administered 2018-04-22 – 2018-04-23 (×5): 1 via OROMUCOSAL
  Filled 2018-04-22: qty 177

## 2018-04-22 MED ORDER — LISINOPRIL 10 MG PO TABS
20.0000 mg | ORAL_TABLET | Freq: Every day | ORAL | Status: DC
  Administered 2018-04-22: 20 mg via ORAL
  Filled 2018-04-22 (×2): qty 2

## 2018-04-22 MED ORDER — FAMOTIDINE IN NACL 20-0.9 MG/50ML-% IV SOLN
20.0000 mg | Freq: Two times a day (BID) | INTRAVENOUS | Status: DC
  Administered 2018-04-22 – 2018-04-29 (×16): 20 mg via INTRAVENOUS
  Filled 2018-04-22 (×17): qty 50

## 2018-04-22 MED ORDER — ISOSORBIDE MONONITRATE ER 60 MG PO TB24
30.0000 mg | ORAL_TABLET | Freq: Every day | ORAL | Status: DC | PRN
  Filled 2018-04-22: qty 1

## 2018-04-22 MED ORDER — ONDANSETRON HCL 4 MG/2ML IJ SOLN
4.0000 mg | Freq: Four times a day (QID) | INTRAMUSCULAR | Status: DC | PRN
  Administered 2018-04-22 – 2018-04-25 (×5): 4 mg via INTRAVENOUS
  Filled 2018-04-22 (×5): qty 2

## 2018-04-22 MED ORDER — PRAVASTATIN SODIUM 10 MG PO TABS
10.0000 mg | ORAL_TABLET | Freq: Every day | ORAL | Status: DC
  Administered 2018-04-22 – 2018-04-28 (×4): 10 mg via ORAL
  Filled 2018-04-22 (×8): qty 1

## 2018-04-22 MED ORDER — GABAPENTIN 100 MG PO CAPS
100.0000 mg | ORAL_CAPSULE | Freq: Every day | ORAL | Status: DC
  Administered 2018-04-24 – 2018-04-29 (×6): 100 mg via ORAL
  Filled 2018-04-22 (×6): qty 1

## 2018-04-22 MED ORDER — LACTATED RINGERS IV BOLUS
500.0000 mL | Freq: Once | INTRAVENOUS | Status: AC
  Administered 2018-04-22: 500 mL via INTRAVENOUS

## 2018-04-22 MED ORDER — ARFORMOTEROL TARTRATE 15 MCG/2ML IN NEBU: 15.0000 ug | INHALATION_SOLUTION | Freq: Two times a day (BID) | RESPIRATORY_TRACT | Status: DC

## 2018-04-22 MED ORDER — ONDANSETRON HCL 4 MG PO TABS: 4.0000 mg | ORAL_TABLET | Freq: Four times a day (QID) | ORAL | Status: DC | PRN

## 2018-04-22 MED ORDER — GABAPENTIN 100 MG PO CAPS: 100.0000 mg | ORAL_CAPSULE | Freq: Two times a day (BID) | ORAL | Status: DC

## 2018-04-22 MED ORDER — GABAPENTIN 100 MG PO CAPS
200.0000 mg | ORAL_CAPSULE | Freq: Every day | ORAL | Status: DC
  Administered 2018-04-22 – 2018-04-28 (×6): 200 mg via ORAL
  Filled 2018-04-22 (×6): qty 2

## 2018-04-22 MED ORDER — ARFORMOTEROL TARTRATE 15 MCG/2ML IN NEBU
15.0000 ug | INHALATION_SOLUTION | Freq: Two times a day (BID) | RESPIRATORY_TRACT | Status: DC
  Administered 2018-04-22 – 2018-04-29 (×15): 15 ug via RESPIRATORY_TRACT
  Filled 2018-04-22 (×16): qty 2

## 2018-04-22 MED ORDER — MOMETASONE FURO-FORMOTEROL FUM 100-5 MCG/ACT IN AERO
2.0000 | INHALATION_SPRAY | Freq: Two times a day (BID) | RESPIRATORY_TRACT | Status: DC
Start: 2018-04-22 — End: 2018-04-29
  Administered 2018-04-22 – 2018-04-29 (×14): 2 via RESPIRATORY_TRACT
  Filled 2018-04-22: qty 8.8

## 2018-04-22 NOTE — H&P (Signed)
Kristen Schroeder is an 77 y.o. female.    Chief Complaint: Nausea vomiting and abdominal pain  HPI: This patient approximately 8 days postop from an exploratory laparotomy and reduction of an incarcerated ventral hernia with small bowel resection.  She called earlier today stating that she was having nausea and vomiting and was asked to come to the emergency room.  She denies fevers or chills but has not passed any gas.  Work-up in the emergency room suggested high-grade bowel obstruction at the area of the anastomosis.  Past Medical History:  Diagnosis Date  . Arthritis   . COPD (chronic obstructive pulmonary disease) ([place])   . Diabetes mellitus without complication ([place])   . Dysrhythmia   . Heart murmur   . History of orthopnea   . Hypertension   . Hypothyroidism   . Neuropathy   . Oxygen dependent    3L  CONTINUOUS  . Pain CHRONIC BACK PAIN  . Shortness of breath dyspnea   . Wheezing     Past Surgical History:  Procedure Laterality Date  . ABDOMINAL HYSTERECTOMY    . BOWEL RESECTION N/A 04/14/2018   Procedure: SMALL BOWEL RESECTION;  Surgeon: Jules Husbands, MD;  Location: [facility] ORS;  Service: General;  Laterality: N/A;  . CATARACT EXTRACTION W/PHACO Left 07/03/2016   Procedure: CATARACT EXTRACTION PHACO AND INTRAOCULAR LENS PLACEMENT ([place]);  Surgeon: Birder Robson, MD;  Location: [facility] ORS;  Service: Ophthalmology;  Laterality: Left;  Lot: [id number] H [place]: 00:40.1 AP%: 17.4 CDE:6.94  . HERNIA REPAIR    . TUBAL LIGATION    . VENTRAL HERNIA REPAIR N/A 04/14/2018   Procedure: HERNIA REPAIR VENTRAL ADULT;  Surgeon: Jules Husbands, MD;  Location: [facility] ORS;  Service: General;  Laterality: N/A;    Family History  Problem Relation Age of Onset  . Heart disease Mother   . Drug abuse Other   . Hypertension Other    Social History:  reports that she has quit smoking. She has a 7.50 pack-year smoking history. She has never used smokeless tobacco. She reports that she does not drink  alcohol or use drugs.  Allergies:  Allergies  Allergen Reactions  . Codeine Nausea And Vomiting and Nausea Only     (Not in a hospital admission)   Review of Systems  Constitutional: Negative.   HENT: Negative.   Eyes: Negative.   Respiratory: Negative.   Cardiovascular: Negative.   Gastrointestinal: Positive for abdominal pain, nausea and vomiting. Negative for heartburn.  Genitourinary: Negative.   Musculoskeletal: Negative.   Skin: Negative.   Neurological: Negative.   Endo/Heme/Allergies: Negative.   Psychiatric/Behavioral: Negative.      Physical Exam:  BP (!) 157/77 (BP Location: Left Arm)   Pulse 98   Temp 98 F (36.7 C) (Oral)   Resp 17   Ht _0  (1.727 m)   Wt 222 lb (100.7 kg)   SpO2 99%   BMI 33.75 kg/m   Physical Exam  Constitutional: She appears well-developed and well-nourished.  Appears comfortable.  Morbidly obese  HENT:  Head: Normocephalic and atraumatic.  Eyes: Pupils are equal, round, and reactive to light. EOM are normal.  Neck: Normal range of motion. Neck supple.  Cardiovascular: Normal rate and regular rhythm.  Pulmonary/Chest: Effort normal and breath sounds normal.  Abdominal: Soft. She exhibits distension. She exhibits no mass. There is no tenderness. There is no guarding.  Infraumbilical transverse incision on the large pannus with no erythema.  Staples still in place.  No drainage.  Skin: Skin is warm and dry. No erythema.  Vitals reviewed.       Results for orders placed or performed during the hospital encounter of 04/21/18 (from the past 48 hour(s))  CBC with Differential     Status: Abnormal   Collection Time: 04/21/18  9:34 PM  Result Value Ref Range   WBC 13.9 (H) 3.6 - 11.0 K/uL   RBC 3.58 (L) 3.80 - 5.20 MIL/uL   Hemoglobin 10.4 (L) 12.0 - 16.0 g/dL   HCT 31.9 (L) 35.0 - 47.0 %   MCV 89.0 80.0 - 100.0 fL   MCH 29.0 26.0 - 34.0 pg   MCHC 32.5 32.0 - 36.0 g/dL   RDW 15.4 (H) 11.5 - 14.5 %   Platelets 288 150 -  440 K/uL   Neutrophils Relative % 87 %   Neutro Abs 12.0 (H) 1.4 - 6.5 K/uL   Lymphocytes Relative 4 %   Lymphs Abs 0.6 (L) 1.0 - 3.6 K/uL   Monocytes Relative 8 %   Monocytes Absolute 1.1 (H) 0.2 - 0.9 K/uL   Eosinophils Relative 1 %   Eosinophils Absolute 0.2 0 - 0.7 K/uL   Basophils Relative 0 %   Basophils Absolute 0.0 0 - 0.1 K/uL    Comment: Performed at [facility], [address] [place], [place] [address]  Comprehensive metabolic panel     Status: Abnormal   Collection Time: 04/21/18  9:34 PM  Result Value Ref Range   Sodium 141 135 - 145 mmol/L   Potassium 3.8 3.5 - 5.1 mmol/L    Comment: HEMOLYSIS AT THIS LEVEL MAY AFFECT RESULT   Chloride 97 (L) 101 - 111 mmol/L   CO2 31 22 - 32 mmol/L   Glucose, Bld 113 (H) 65 - 99 mg/dL   BUN 9 6 - 20 mg/dL   Creatinine, Ser 0.46 0.44 - 1.00 mg/dL   Calcium 8.9 8.9 - 10.3 mg/dL   Total Protein 6.7 6.5 - 8.1 g/dL   Albumin 3.4 (L) 3.5 - 5.0 g/dL   AST 28 15 - 41 U/L   ALT 11 (L) 14 - 54 U/L   Alkaline Phosphatase 76 38 - 126 U/L   Total Bilirubin 1.0 0.3 - 1.2 mg/dL   GFR calc non Af Amer >60 >60 mL/min   GFR calc Af Amer >60 >60 mL/min    Comment: (NOTE) The eGFR has been calculated using the CKD EPI equation. This calculation has not been validated in all clinical situations. eGFR's persistently <60 mL/min signify possible Chronic Kidney Disease.    Anion gap 13 5 - 15    Comment: Performed at [facility], [address] [place], [place] [address]  Lipase, blood     Status: None   Collection Time: 04/21/18  9:34 PM  Result Value Ref Range   Lipase 21 11 - 51 U/L    Comment: Performed at [facility], [address] [place], [place] [address]   Ct Abdomen Pelvis W Contrast  Result Date: 04/21/2018 CLINICAL DATA:  77 year old female with lower abdominal pain and nausea following recent surgery on 04/14/2018 for incarcerated ventral small-bowel hernia. EXAM: CT ABDOMEN AND PELVIS WITH  CONTRAST TECHNIQUE: Multidetector CT imaging of the abdomen and pelvis was performed using the standard protocol following bolus administration of intravenous contrast. CONTRAST:  158m ISOVUE-300 IOPAMIDOL (ISOVUE-300) INJECTION 61% COMPARISON:  Preoperative CT Abdomen and Pelvis 04/13/2018. FINDINGS: Lower chest: Stable 6-7 mm left lower lobe subpleural nodule since 06/14/2017, likely  benign. Bilateral lower lobe bronchiectasis and mild scarring. No acute pulmonary opacity, pleural or pericardial effusion. Hepatobiliary: Trace perihepatic free fluid. Otherwise negative liver. 20 mm gallstone redemonstrated in the gallbladder neck. No pericholecystic inflammation. Pancreas: Negative. Spleen: Negative. Adrenals/Urinary Tract: Stable and negative adrenal glands. Bilateral renal enhancement and contrast excretion is stable and within normal limits. Normal course of the ureters to the urinary bladder. Diminutive and unremarkable urinary bladder. Stomach/Bowel: Negative rectum aside from mild retained stool. Decompressed sigmoid colon. Proximal sigmoid diverticulosis. Intermediate density free fluid abutting the sigmoid colon and tracking into the pelvis. Diverticulosis continues into the left colon which is decompressed. The transverse colon is redundant but largely decompressed. Mildly redundant ascending colon is otherwise negative. The terminal ileum is decompressed and negative. There are decompressed small bowel loops throughout the right lower abdomen. The stomach and duodenum are moderately to severely distended with fluid. Similar distension of the jejunum with fluid-filled loops up to 5 centimeters diameter. There is an abrupt transition point adjacent to what appears to be a side to side small bowel anastomosis in the left lower quadrant. See series 2, images 65 through 70 and sagittal image 106. There is no extraluminal gas in the abdomen. The dominant fat containing left paracentral small bowel hernia was  repaired since the prior CT. A smaller mesenteric fat containing right paracentral hernia is stable (series 2, image 72 and sagittal images 87 through 77). Widespread ventral abdominal wall soft tissue thickening and stranding appears to be postoperative in nature. No organized abdominal wall fluid collection. Skin staples remain in place. Vascular/Lymphatic: Calcified aortic atherosclerosis. The major arterial structures in the abdomen and pelvis are patent. Reproductive: Surgically absent. Other: Intermediate density free fluid in the pelvis. Musculoskeletal: Stable visualized osseous structures. IMPRESSION: 1. High-grade Small Bowel Obstruction with transition point at the postoperative small bowel anastomosis in the left lower quadrant. Subsequent moderate to severe dilatation of upstream jejunum, the stomach and duodenum. 2. No pneumoperitoneum to suggest perforation. A small volume lower abdominal and pelvic hemoperitoneum is likely postoperative in nature. A trace amount of upper abdominal free fluid may be reactive due to #1. 3. Extensive ventral abdominal wall inflammation also appears to be postoperative. No organized fluid collection. 4. Interval repair of the dominant fat and bowel containing ventral abdominal hernia. Stable smaller right paracentral fat containing hernia (series 2, image 72). 5. Cholelithiasis. Aortic Atherosclerosis (ICD10-I70.0). Lower lobe bronchiectasis. Electronically Signed   By: Genevie Ann M.D.   On: 04/21/2018 22:48     Assessment/Plan  Labs and x-ray personally reviewed.  CT scan reviewed.  This is a patient with a postoperative bowel obstruction following anastomosis for small bowel resection due to incarcerated ventral hernia.  She does not appear to be in any serious distress but requires IV hydration and nasogastric tube placement.  This is being performed in the emergency room.  She will be admitted to the hospital and reexamined.  Florene Glen, MD, FACS
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
## 2018-04-22 NOTE — Care Management (Signed)
Patient readmitted for post op bowel obstruction. PCP [facility].  Pharmacy [facility].  Previous Admsision patient declined SNF placement and went home with with  Home health through [facility]. Patient also received a RW at discharge.  Patient has chronic O2 with [employer]. Barbara CowerJason with [facility] notifed of admission.  RNCM following for needs.

## 2018-04-23 ENCOUNTER — Inpatient Hospital Stay: Payer: Medicare Other

## 2018-04-23 LAB — BASIC METABOLIC PANEL
Anion gap: 6 (ref 5–15)
BUN: 6 mg/dL (ref 6–20)
CO2: 34 mmol/L — ABNORMAL HIGH (ref 22–32)
Calcium: 8.3 mg/dL — ABNORMAL LOW (ref 8.9–10.3)
Chloride: 102 mmol/L (ref 101–111)
Creatinine, Ser: <0.3 mg/dL — ABNORMAL LOW (ref 0.44–1.00)
Glucose, Bld: 125 mg/dL — ABNORMAL HIGH (ref 65–99)
Potassium: 3.7 mmol/L (ref 3.5–5.1)
Sodium: 142 mmol/L (ref 135–145)

## 2018-04-23 LAB — CBC
HCT: 29.5 % — ABNORMAL LOW (ref 35.0–47.0)
Hemoglobin: 9.6 g/dL — ABNORMAL LOW (ref 12.0–16.0)
MCH: 28.9 pg (ref 26.0–34.0)
MCHC: 32.5 g/dL (ref 32.0–36.0)
MCV: 89 fL (ref 80.0–100.0)
Platelets: 270 10*3/uL (ref 150–440)
RBC: 3.31 MIL/uL — ABNORMAL LOW (ref 3.80–5.20)
RDW: 15.3 % — ABNORMAL HIGH (ref 11.5–14.5)
WBC: 9.1 10*3/uL (ref 3.6–11.0)

## 2018-04-23 MED ORDER — HEPARIN SODIUM (PORCINE) 5000 UNIT/ML IJ SOLN
5000.0000 [IU] | Freq: Three times a day (TID) | INTRAMUSCULAR | Status: DC
  Administered 2018-04-23 – 2018-04-28 (×15): 5000 [IU] via SUBCUTANEOUS
  Filled 2018-04-23 (×15): qty 1

## 2018-04-23 NOTE — Progress Notes (Signed)
CC: SBO Subjective: She feels better, no abd pain. Passing flatus NGT 550cc KUB p. Reviewed, some dilated loops sb, no free air, no pneumatosis. Some air in the colon  Objective: Vital signs in last 24 hours: Temp:  [97.5 F (36.4 C)-98.8 F (37.1 C)] 97.5 F (36.4 C) (06/19 0440) Pulse Rate:  [71-96] 71 (06/19 0900) Resp:  [18-22] 20 (06/19 0440) BP: (97-169)/(46-80) 115/68 (06/19 0900) SpO2:  [100 %] 100 % (06/19 0815) Last BM Date: 04/21/18  Intake/Output from previous day: 06/18 0701 - 06/19 0700 In: 2867.3 [I.V.:2245; IV Piggyback:622.3] Out: 1625 [Urine:975; Emesis/NG output:650] Intake/Output this shift: Total I/O In: -  Out: 400 [Urine:400]  Physical exam:  NAD Abd: incisions c/d/i, no infection , no peritonitis, non tender. Ext: well perfused and no edema  Lab Results: CBC  Recent Labs    04/22/18 0511 04/23/18 0536  WBC 11.3* 9.1  HGB 10.2* 9.6*  HCT 31.5* 29.5*  PLT 275 270   BMET Recent Labs    04/21/18 2134 04/22/18 0511 04/23/18 0536  NA 141  --  142  K 3.8  --  3.7  CL 97*  --  102  CO2 31  --  34*  GLUCOSE 113*  --  125*  BUN 9  --  6  CREATININE 0.46 0.34* <0.30*  CALCIUM 8.9  --  8.3*   PT/INR No results for input(s): LABPROT, INR in the last 72 hours. ABG No results for input(s): PHART, HCO3 in the last 72 hours.  Invalid input(s): PCO2, PO2  Studies/Results: Ct Abdomen Pelvis W Contrast  Result Date: 04/21/2018 CLINICAL DATA:  77 year old female with lower abdominal pain and nausea following recent surgery on 04/14/2018 for incarcerated ventral small-bowel hernia. EXAM: CT ABDOMEN AND PELVIS WITH CONTRAST TECHNIQUE: Multidetector CT imaging of the abdomen and pelvis was performed using the standard protocol following bolus administration of intravenous contrast. CONTRAST:  ISOVUE-300 IOPAMIDOL (ISOVUE-300) INJECTION 61% COMPARISON:  Preoperative CT Abdomen and Pelvis 04/13/2018. FINDINGS: Lower chest: Stable 6-7 mm left  lower lobe subpleural nodule since 06/14/2017, likely benign. Bilateral lower lobe bronchiectasis and mild scarring. No acute pulmonary opacity, pleural or pericardial effusion. Hepatobiliary: Trace perihepatic free fluid. Otherwise negative liver. 20 mm gallstone redemonstrated in the gallbladder neck. No pericholecystic inflammation. Pancreas: Negative. Spleen: Negative. Adrenals/Urinary Tract: Stable and negative adrenal glands. Bilateral renal enhancement and contrast excretion is stable and within normal limits. Normal course of the ureters to the urinary bladder. Diminutive and unremarkable urinary bladder. Stomach/Bowel: Negative rectum aside from mild retained stool. Decompressed sigmoid colon. Proximal sigmoid diverticulosis. Intermediate density free fluid abutting the sigmoid colon and tracking into the pelvis. Diverticulosis continues into the left colon which is decompressed. The transverse colon is redundant but largely decompressed. Mildly redundant ascending colon is otherwise negative. The terminal ileum is decompressed and negative. There are decompressed small bowel loops throughout the right lower abdomen. The stomach and duodenum are moderately to severely distended with fluid. Similar distension of the jejunum with fluid-filled loops up to 5 centimeters diameter. There is an abrupt transition point adjacent to what appears to be a side to side small bowel anastomosis in the left lower quadrant. See series 2, images 65 through 70 and sagittal image 106. There is no extraluminal gas in the abdomen. The dominant fat containing left paracentral small bowel hernia was repaired since the prior CT. A smaller mesenteric fat containing right paracentral hernia is stable (series 2, image 72 and sagittal images 87 through 77). Widespread ventral  abdominal wall soft tissue thickening and stranding appears to be postoperative in nature. No organized abdominal wall fluid collection. Skin staples remain in  place. Vascular/Lymphatic: Calcified aortic atherosclerosis. The major arterial structures in the abdomen and pelvis are patent. Reproductive: Surgically absent. Other: Intermediate density free fluid in the pelvis. Musculoskeletal: Stable visualized osseous structures. IMPRESSION: 1. High-grade Small Bowel Obstruction with transition point at the postoperative small bowel anastomosis in the left lower quadrant. Subsequent moderate to severe dilatation of upstream jejunum, the stomach and duodenum. 2. No pneumoperitoneum to suggest perforation. A small volume lower abdominal and pelvic hemoperitoneum is likely postoperative in nature. A trace amount of upper abdominal free fluid may be reactive due to #1. 3. Extensive ventral abdominal wall inflammation also appears to be postoperative. No organized fluid collection. 4. Interval repair of the dominant fat and bowel containing ventral abdominal hernia. Stable smaller right paracentral fat containing hernia (series 2, image 72). 5. Cholelithiasis. Aortic Atherosclerosis (ICD10-I70.0). Lower lobe bronchiectasis. Electronically Signed   By: Odessa FlemingH  Hall M.D.   On: 04/21/2018 22:48   Dg Abd Acute W/chest  Result Date: 04/23/2018 CLINICAL DATA:  Small bowel obstruction EXAM: DG ABDOMEN ACUTE W/ 1V CHEST COMPARISON:  04/21/2018 FINDINGS: Heart is borderline in size with mild vascular congestion. Bibasilar atelectasis or scarring is similar prior study. NG tube is in the stomach. Dilated small bowel loops in the abdomen are compatible with small bowel obstruction, similar to prior CT. No free air organomegaly. IMPRESSION: Persistent small bowel obstruction pattern.  No real change. Borderline heart size with vascular congestion. Bibasilar atelectasis or scarring. Electronically Signed   By: Charlett NoseKevin  Dover M.D.   On: 04/23/2018 08:20   Dg Abd Portable 1 View  Result Date: 04/22/2018 CLINICAL DATA:  NG tube placement. EXAM: PORTABLE ABDOMEN - 1 VIEW COMPARISON:  None.  FINDINGS: Targeted study of the upper abdomen for gastric tube placement. The tip of a gastric tube extends below the left hemidiaphragm into the expected location of the body of the stomach. The side port however is 3 cm above the GE junction and further advancement of the tube is recommended by at least 6 cm the included heart is top normal. Streaky bibasilar atelectasis is identified. There is no pneumoperitoneum. No radio-opaque calculi or other significant radiographic abnormality are seen. IMPRESSION: Further advancement of gastric tube by at least 6 cm is recommended. The side-port is noted above the GE junction by approximately 3 cm. Electronically Signed   By: Tollie Ethavid  Kwon M.D.   On: 04/22/2018 00:49    Anti-infectives: Anti-infectives (From admission, onward)   None      Assessment/Plan:  Partial SBO Clinically improving No need for surgical intervention We will continue NGT and NPO for now since there is still some SB dilation   Sterling Bigiego Shadee Montoya, MD, FACS  04/23/2018

## 2018-04-23 NOTE — Evaluation (Signed)
Physical Therapy Evaluation Patient Details Name: Kristen Schroeder MRN: [id number] DOB: 1941/01/20 Today's Date: 04/23/2018   History of Present Illness  admitted with nausea/vomiting; admitted with high-grade SBO at anastomosis, managed conservatively with NGT, NPO diet. Of note, patient 10-days post-op exploratory laparotomy with reduction of incarcerated ventral hernia.    Clinical Impression  Upon evaluation, patient alert and oriented; follows commands and demonstrates good effort with all mobility tasks.  Bilat UE/LE strength and ROM grossly symmetrical and WFL for basic transfers and mobility.  Able to complete bed mobility with close sup; sit/stand, basic transfers and gait (30') with RW, cga/min assist.  Postural extension limited due to abdominal pain; but demonstrates fair stability and control with all mobility tasks. HR elevation to 129 with exertion, recovers to low-100s with seated rest; sats maintained >89-90% on 3L at rest and with exertion. Would benefit from skilled PT to address above deficits and promote optimal return to PLOF; Recommend transition to HHPT upon discharge from acute hospitalization.     Follow Up Recommendations Home health PT    Equipment Recommendations  Rolling walker with 5" wheels;3in1 (PT)(tub transfer bench)    Recommendations for Other Services       Precautions / Restrictions Precautions Precautions: Fall Precaution Comments: NGT, NPO Restrictions Weight Bearing Restrictions: No      Mobility  Bed Mobility Overal bed mobility: Needs Assistance Bed Mobility: Supine to Sit     Supine to sit: Supervision     General bed mobility comments: elevated HOB, bedrails required  Transfers Overall transfer level: Needs assistance Equipment used: Rolling walker (2 wheeled) Transfers: Sit to/from Stand Sit to Stand: Min assist         General transfer comment: multiple attempts required to initiate/complete lift off; does require use of  UEs to assist with movement transition  Ambulation/Gait Ambulation/Gait assistance: Min assist Gait Distance (Feet): 30 Feet Assistive device: Rolling walker (2 wheeled)       General Gait Details: forward trunk flexion (extension limited by pain); slow and guarded.  Additional distance limited by fatigue.  Stairs            Wheelchair Mobility    Modified Rankin (Stroke Patients Only)       Balance Overall balance assessment: Needs assistance Sitting-balance support: No upper extremity supported;Feet supported Sitting balance-Leahy Scale: Good     Standing balance support: Bilateral upper extremity supported Standing balance-Leahy Scale: Fair                               Pertinent Vitals/Pain Pain Assessment: Faces Faces Pain Scale: Hurts little more Pain Location: Abdomen during movement. Pain Descriptors / Indicators: Aching;Grimacing;Guarding Pain Intervention(s): Limited activity within patient's tolerance;Monitored during session;Repositioned    Home Living Family/patient expects to be discharged to:: Private residence Living Arrangements: Spouse/significant other Available Help at Discharge: Family Type of Home: House Home Access: Stairs to enter Entrance Stairs-Rails: None Entrance Stairs-Number of Steps: 1 Home Layout: One level Home Equipment: Environmental consultant - 4 wheels;Cane - single point      Prior Function Level of Independence: Independent with assistive device(s)         Comments: Mod indep with 4WRW for ADLs, household and limited community mobilization as appropriate; husband assists with ADLs as appropriate; denies fall history; home O2 at 3L.     Hand Dominance        Extremity/Trunk Assessment   Upper Extremity Assessment Upper Extremity Assessment:  Overall [facility] for tasks assessed    Lower Extremity Assessment Lower Extremity Assessment: Overall WFL for tasks assessed(grossly at least 4-/5 throughout)        Communication   Communication: No difficulties  Cognition Arousal/Alertness: Awake/alert Behavior During Therapy: WFL for tasks assessed/performed Overall Cognitive Status: Within Functional Limits for tasks assessed                                        General Comments      Exercises Other Exercises Other Exercises: Seated LE therex, 1x10-20: ankle pumps, marching. Other Exercises: Reviewed home safety modifications and adaptive equipment (tub transfer bench, BSC) to make home safer and more accessible.  Patient very interested and receptive to infomration; may benefit from both at discharge.   Assessment/Plan    PT Assessment Patient needs continued PT services  PT Problem List Decreased strength;Decreased activity tolerance;Decreased balance;Decreased mobility;Decreased coordination;Decreased cognition;Decreased knowledge of use of DME;Decreased safety awareness;Decreased knowledge of precautions;Decreased skin integrity;Pain       PT Treatment Interventions DME instruction;Gait training;Stair training;Functional mobility training;Therapeutic activities;Therapeutic exercise;Balance training;Patient/family education    PT Goals (Current goals can be found in the Care Plan section)  Acute Rehab PT Goals Patient Stated Goal: to get better PT Goal Formulation: With patient/family Time For Goal Achievement: 05/07/18 Potential to Achieve Goals: Good    Frequency Min 2X/week   Barriers to discharge Decreased caregiver support      Co-evaluation               AM-PAC PT "6 Clicks" Daily Activity  Outcome Measure Difficulty turning over in bed (including adjusting bedclothes, sheets and blankets)?: A Little Difficulty moving from lying on back to sitting on the side of the bed? : A Little Difficulty sitting down on and standing up from a chair with arms (e.g., wheelchair, bedside commode, etc,.)?: Unable Help needed moving to and from a bed to chair  (including a wheelchair)?: A Little Help needed walking in hospital room?: A Little Help needed climbing 3-5 steps with a railing? : A Lot 6 Click Score: 15    End of Session Equipment Utilized During Treatment: Gait belt;Oxygen Activity Tolerance: Patient tolerated treatment well Patient left: in chair;with call bell/phone within reach;with chair alarm set;with family/visitor present Nurse Communication: Mobility status PT Visit Diagnosis: Muscle weakness (generalized) (M62.81);Difficulty in walking, not elsewhere classified (R26.2);Pain    Time: 1525-1610 PT Time Calculation (min) (ACUTE ONLY): 45 min   Charges:   PT Evaluation $PT Eval Moderate Complexity: 1 Mod PT Treatments $Therapeutic Exercise: 8-22 mins $Therapeutic Activity: 8-22 mins   PT G Codes:        Evaluation/treatment session was lead and completed by Iris PertMicah Munoz, SPT; directly observed, guided and documented by Cephus SlaterKristen [place] Mer[place], PT.  Tommy RainwaterKristen H. Manson PasseyBrown, PT, DPT, NCS 04/23/18, 4:02 PM [phone]

## 2018-04-24 ENCOUNTER — Inpatient Hospital Stay: Payer: Medicare Other

## 2018-04-24 ENCOUNTER — Inpatient Hospital Stay: Payer: Self-pay

## 2018-04-24 LAB — CBC
HCT: 27.9 % — ABNORMAL LOW (ref 35.0–47.0)
Hemoglobin: 9.2 g/dL — ABNORMAL LOW (ref 12.0–16.0)
MCH: 29.5 pg (ref 26.0–34.0)
MCHC: 33.2 g/dL (ref 32.0–36.0)
MCV: 89 fL (ref 80.0–100.0)
Platelets: 258 10*3/uL (ref 150–440)
RBC: 3.13 MIL/uL — ABNORMAL LOW (ref 3.80–5.20)
RDW: 15.8 % — ABNORMAL HIGH (ref 11.5–14.5)
WBC: 8.4 10*3/uL (ref 3.6–11.0)

## 2018-04-24 LAB — GLUCOSE, CAPILLARY
Glucose-Capillary: 105 mg/dL — ABNORMAL HIGH (ref 65–99)
Glucose-Capillary: 76 mg/dL (ref 65–99)
Glucose-Capillary: 93 mg/dL (ref 65–99)

## 2018-04-24 LAB — BASIC METABOLIC PANEL
Anion gap: 5 (ref 5–15)
BUN: <5 mg/dL — ABNORMAL LOW (ref 6–20)
CO2: 33 mmol/L — ABNORMAL HIGH (ref 22–32)
Calcium: 8.2 mg/dL — ABNORMAL LOW (ref 8.9–10.3)
Chloride: 104 mmol/L (ref 101–111)
Creatinine, Ser: 0.42 mg/dL — ABNORMAL LOW (ref 0.44–1.00)
GFR calc Af Amer: >60 mL/min (ref >60)
GFR calc non Af Amer: >60 mL/min (ref >60)
Glucose, Bld: 113 mg/dL — ABNORMAL HIGH (ref 65–99)
Potassium: 3.8 mmol/L (ref 3.5–5.1)
Sodium: 142 mmol/L (ref 135–145)

## 2018-04-24 MED ORDER — SODIUM CHLORIDE 0.9% FLUSH
10.0000 mL | Freq: Two times a day (BID) | INTRAVENOUS | Status: DC
  Administered 2018-04-24 – 2018-04-27 (×6): 10 mL
  Administered 2018-04-28: 20 mL
  Administered 2018-04-28 – 2018-04-29 (×2): 10 mL

## 2018-04-24 MED ORDER — INSULIN ASPART 100 UNIT/ML ~~LOC~~ SOLN
0.0000 [IU] | SUBCUTANEOUS | Status: DC
  Administered 2018-04-25: 1 [IU] via SUBCUTANEOUS
  Filled 2018-04-24: qty 1

## 2018-04-24 MED ORDER — FAT EMULSION PLANT BASED 20 % IV EMUL
250.0000 mL | INTRAVENOUS | Status: AC
  Administered 2018-04-24: 250 mL via INTRAVENOUS
  Filled 2018-04-24 (×2): qty 250

## 2018-04-24 MED ORDER — SODIUM CHLORIDE 0.9% FLUSH
10.0000 mL | INTRAVENOUS | Status: DC | PRN
  Administered 2018-04-28: 10 mL
  Filled 2018-04-24: qty 40

## 2018-04-24 MED ORDER — SODIUM CHLORIDE 0.9 % IV SOLN
INTRAVENOUS | Status: AC
  Administered 2018-04-24 – 2018-04-25 (×2): via INTRAVENOUS

## 2018-04-24 MED ORDER — TRACE MINERALS CR-CU-MN-SE-ZN 10-1000-500-60 MCG/ML IV SOLN
INTRAVENOUS | Status: AC
  Administered 2018-04-24: 19:00:00 via INTRAVENOUS
  Filled 2018-04-24: qty 984

## 2018-04-24 NOTE — Progress Notes (Signed)
CC: Early postoperative bowel obstruction Subjective:  She feels well.  Denies any pain whatsoever.  She is passing gas.  No fevers no chills.  KUB personally reviewed with persistent dilation of the small bowel.  No free air and no pneumatosis  Objective: Vital signs in last 24 hours: Temp:  [98.1 F (36.7 C)-98.8 F (37.1 C)] 98.3 F (36.8 C) (06/20 16100613) Pulse Rate:  [75-85] 75 (06/20 0613) Resp:  [16-19] 19 (06/20 0613) BP: (136-146)/(62-70) 137/62 (06/20 0613) SpO2:  [95 %-100 %] 95 % (06/20 0809) Last BM Date: 04/21/18  Intake/Output from previous day: 06/19 0701 - 06/20 0700 In: 2944 [I.V.:2894; IV Piggyback:50] Out: 2550 [Urine:1750; Emesis/NG output:800] Intake/Output this shift: Total I/O In: 245 [I.V.:245] Out: -   Physical exam: NAD,  Abd: soft, nt, incision healing well, no infection, no peritonitis Ext: no edema, well perfused  Lab Results: CBC  Recent Labs    04/23/18 0536 04/24/18 0522  WBC 9.1 8.4  HGB 9.6* 9.2*  HCT 29.5* 27.9*  PLT 270 258   BMET Recent Labs    04/23/18 0536 04/24/18 0522  NA 142 142  K 3.7 3.8  CL 102 104  CO2 34* 33*  GLUCOSE 125* 113*  BUN 6 <5*  CREATININE <0.30* 0.42*  CALCIUM 8.3* 8.2*   PT/INR No results for input(s): LABPROT, INR in the last 72 hours. ABG No results for input(s): PHART, HCO3 in the last 72 hours.  Invalid input(s): PCO2, PO2  Studies/Results: Dg Abd Acute W/chest  Result Date: 04/23/2018 CLINICAL DATA:  Small bowel obstruction EXAM: DG ABDOMEN ACUTE W/ 1V CHEST COMPARISON:  04/21/2018 FINDINGS: Heart is borderline in size with mild vascular congestion. Bibasilar atelectasis or scarring is similar prior study. NG tube is in the stomach. Dilated small bowel loops in the abdomen are compatible with small bowel obstruction, similar to prior CT. No free air organomegaly. IMPRESSION: Persistent small bowel obstruction pattern.  No real change. Borderline heart size with vascular congestion.  Bibasilar atelectasis or scarring. Electronically Signed   By: Charlett NoseKevin  Dover M.D.   On: 04/23/2018 08:20   Dg Abd Portable 2v  Result Date: 04/24/2018 CLINICAL DATA:  Small bowel obstruction EXAM: PORTABLE ABDOMEN - 2 VIEW COMPARISON:  Yesterday FINDINGS: Nasogastric tube tip and side-port overlaps the stomach. Dilated small bowel in the left abdomen measuring up to 5 cm in diameter, unchanged. No concerning mass effect or gas collection. Mild atelectatic type opacity at the bases. IMPRESSION: Small bowel obstruction with unchanged distention when compared yesterday. Electronically Signed   By: Marnee SpringJonathon  Watts M.D.   On: 04/24/2018 07:32   [place] Ekg Site Rite  Result Date: 04/24/2018 If Site Rite image not attached, placement could not be confirmed due to current cardiac rhythm.   Anti-infectives: Anti-infectives (From admission, onward)   None      Assessment/Plan: Postoperative bowel obstruction.  This poses a very difficult clinical challenge. Currently she is symptom-free from a clinical perspective there is no free air pneumatosis on x-ray that will mandate surgical intervention.  I discussed with the patient detail extensively.  Given that she has only had 2 days of NG tube management I will release like to give her 4 to 5 days before surgical intervention.  I do understand that since she is approximately 10 days out from her surgery the window of opportunity to have least the complications is it usually 2 weeks after the surgery ( adhesions ). I will continue NG tube management and start TPN.  If by Saturday she is not better I do think that we need to proceed with surgical intervention.  Discussed with the patient detail  Sterling Big, MD, [facility]  04/24/2018

## 2018-04-24 NOTE — Progress Notes (Signed)
Peripherally Inserted Central Catheter/Midline Placement  The IV Nurse has discussed with the patient and/or persons authorized to consent for the patient, the purpose of this procedure and the potential benefits and risks involved with this procedure.  The benefits include less needle sticks, lab draws from the catheter, and the patient may be discharged home with the catheter. Risks include, but not limited to, infection, bleeding, blood clot (thrombus formation), and puncture of an artery; nerve damage and irregular heartbeat and possibility to perform a PICC exchange if needed/ordered by physician.  Alternatives to this procedure were also discussed.  Bard Power PICC patient education guide, fact sheet on infection prevention and patient information card has been provided to patient /or left at bedside.    PICC/Midline Placement Documentation        Timmothy Soursewman, Melaney Tellefsen Renee 04/24/2018, 1:32 PM

## 2018-04-24 NOTE — Progress Notes (Signed)
Initial Nutrition Assessment  DOCUMENTATION CODES:   Obesity unspecified  INTERVENTION:   Clinimix 5/15 with electrolytes at 59m/hr + 20% lipids '@25ml'$ /hr x 12 hrs/day (Goal rate 861mhr once labs stable)   Regimen provides 2014kcal/day, 100g/day protein, 22922molume   Add MVI daily   Add trace elements daily    Pt at moderate refeeding risk; recommend check P, K, and Mg daily for 3 days after TPN iniatition.    Daily weights   Per MD, decrease IVF rate to 42m86m and change to NaCl with no additives.   NUTRITION DIAGNOSIS:   Inadequate oral intake related to acute illness as evidenced by NPO status.  GOAL:   Patient will meet greater than or equal to 90% of their needs  MONITOR:   Diet advancement, Labs, Weight trends, Skin, I & O's  REASON FOR ASSESSMENT:   Consult New TPN/TNA  ASSESSMENT:   76 y33 female admitted with nausea/vomiting; admitted with high-grade SBO at anastomosis, managed conservatively with NGT, NPO diet. Pt s/p exploratory laparotomy with reduction of incarcerated ventral hernia.     Met with pt in room today. Pt reports poor appetite and oral intake since discharge last week but reports her last meal was Sunday morning which was a piece of toast. Pt reports that on Sunday, she started vomiting and having abdominal pain. Pt reports her abdominal pain is improved since NGT placement but she is still having some nausea which became worse this morning after receiving pain meds. Pt reports she is passing flatus but has not had any bowel movements. Per chart, pt is weight stable. Pt is NPO with NGT in place with 800ml42mput x 24 hrs. RD received consult for TPN today. PICC line ordered. Pt likely at moderate refeed risk ; monitor labs.   Medications reviewed and include: heparin, synthroid, paxil, NaCl w/ 5% dextrose and KCl '@125ml'$ /hr, pepcid, hydromorphone   Labs reviewed: K 3.8 wnl, BUN <5(L), creat 0.42(L) Hgb 9.2(L), Hct 27.9(L)  NUTRITION -  FOCUSED PHYSICAL EXAM:    Most Recent Value  Orbital Region  No depletion  Upper Arm Region  No depletion  Thoracic and Lumbar Region  No depletion  Buccal Region  No depletion  Temple Region  No depletion  Clavicle Bone Region  No depletion  Clavicle and Acromion Bone Region  No depletion  Scapular Bone Region  No depletion  Dorsal Hand  No depletion  Patellar Region  No depletion  Anterior Thigh Region  No depletion  Posterior Calf Region  No depletion  Edema (RD Assessment)  None  Hair  Reviewed  Eyes  Reviewed  Mouth  Reviewed  Skin  Reviewed  Nails  Reviewed     Diet Order:   Diet Order           Diet NPO time specified  Diet effective now         EDUCATION NEEDS:   Education needs have been addressed  Skin:  Skin Assessment: Reviewed RN Assessment(incision abdomen )  Last BM:  6/17  Height:   Ht Readings from Last 1 Encounters:  04/22/18 '5\' 6"'$  (1.676 m)    Weight:   Wt Readings from Last 1 Encounters:  04/22/18 222 lb 4.8 oz (100.8 kg)    Ideal Body Weight:  59 kg  BMI:  Body mass index is 35.88 kg/m.  Estimated Nutritional Needs:   Kcal:  1700-2000kcal/day   Protein:  101-111g/day   Fluid:  >1.7L/day   CaseyKoleen DistanceRD, LDN  Pager #- [phone] Office#- [phone] After Hours Pager: [phone]  

## 2018-04-24 NOTE — Progress Notes (Signed)
PHARMACY - ADULT TOTAL PARENTERAL NUTRITION CONSULT NOTE   Pharmacy Consult for TPN Indication: persistent N/V despite NG tube s/p exploratory laparotomy with reduction of incarcerated ventral hernia   Patient Measurements: Height: 5\' 6"  (167.6 cm) Weight: 222 lb 4.8 oz (100.8 kg) IBW/kg (Calculated) : 59.3 TPN AdjBW (KG): 69.7 Body mass index is 35.88 kg/m.  Assessment: Pt reports poor appetite and oral intake since discharge last week but reports her last meal was Sunday morning which was a piece of toast. Pt reports that on Sunday, she started vomiting and having abdominal pain. Pt reports her abdominal pain is improved since NGT placement but she is still having some nausea which became worse this morning after receiving pain meds. Pt reports she is passing flatus but has not had any bowel movements. Per chart, pt is weight stable. Pt is NPO with NGT in place with 800ml output x 24 hrs  GI:  Endo: none Insulin requirements in the past 24 hours:  Lytes: Renal: Pulm: Cards:  Hepatobil: Neuro: ID:  TPN Access:PICC line ordered  TPN start date: 04/24/18 Nutritional Goals (per RD recommendation on 04/24/18): KCal: 1700-2000kcal/day Protein: 101-111g/day Fluid: >1.7L/day  Goal TPN rate is 41 ml/hr (provides 2014kcal/day, 100g/day protein, 2292ml volume at goal rate of 6483ml/hr)  Current Nutrition:   Plan:  E 5/15 TPN at 2041mL/hr. This TPN provides 100 g of protein, 150 g of dextrose, and 60 g of lipids which provides 2014 kCals per day, meeting 90% of patient needs Electrolytes in TPN: none added Add MVI, trace elements to TPN q4h sensitive scale SSI and adjust as needed NS IVMF (D5, NS, etc.) at 85 ml/hr Monitor TPN labs, daily F/U 6/21 am  Lowella Bandyodney D Darnice Comrie, Pharm D 04/24/2018,10:34 AM

## 2018-04-25 ENCOUNTER — Inpatient Hospital Stay: Payer: Medicare Other

## 2018-04-25 LAB — COMPREHENSIVE METABOLIC PANEL
ALT: 7 U/L — ABNORMAL LOW (ref 14–54)
AST: 14 U/L — ABNORMAL LOW (ref 15–41)
Albumin: 2.7 g/dL — ABNORMAL LOW (ref 3.5–5.0)
Alkaline Phosphatase: 55 U/L (ref 38–126)
Anion gap: 6 (ref 5–15)
BUN: 6 mg/dL (ref 6–20)
CO2: 35 mmol/L — ABNORMAL HIGH (ref 22–32)
Calcium: 8.1 mg/dL — ABNORMAL LOW (ref 8.9–10.3)
Chloride: 100 mmol/L — ABNORMAL LOW (ref 101–111)
Creatinine, Ser: 0.4 mg/dL — ABNORMAL LOW (ref 0.44–1.00)
GFR calc Af Amer: >60 mL/min (ref >60)
GFR calc non Af Amer: >60 mL/min (ref >60)
Glucose, Bld: 115 mg/dL — ABNORMAL HIGH (ref 65–99)
Potassium: 3.5 mmol/L (ref 3.5–5.1)
Sodium: 141 mmol/L (ref 135–145)
Total Bilirubin: 0.4 mg/dL (ref 0.3–1.2)
Total Protein: 5.6 g/dL — ABNORMAL LOW (ref 6.5–8.1)

## 2018-04-25 LAB — GLUCOSE, CAPILLARY
Glucose-Capillary: 110 mg/dL — ABNORMAL HIGH (ref 65–99)
Glucose-Capillary: 132 mg/dL — ABNORMAL HIGH (ref 65–99)
Glucose-Capillary: 108 mg/dL — ABNORMAL HIGH (ref 65–99)
Glucose-Capillary: 91 mg/dL (ref 65–99)
Glucose-Capillary: 99 mg/dL (ref 65–99)

## 2018-04-25 LAB — DIFFERENTIAL
Basophils Absolute: 0 10*3/uL (ref 0–0.1)
Basophils Relative: 0 %
Eosinophils Absolute: 0.1 10*3/uL (ref 0–0.7)
Eosinophils Relative: 2 %
Lymphocytes Relative: 10 %
Lymphs Abs: 0.7 10*3/uL — ABNORMAL LOW (ref 1.0–3.6)
Monocytes Absolute: 0.8 10*3/uL (ref 0.2–0.9)
Monocytes Relative: 11 %
Neutro Abs: 5.9 10*3/uL (ref 1.4–6.5)
Neutrophils Relative %: 77 %

## 2018-04-25 LAB — TRIGLYCERIDES: Triglycerides: 147 mg/dL (ref <150)

## 2018-04-25 LAB — CBC
HCT: 26.3 % — ABNORMAL LOW (ref 35.0–47.0)
Hemoglobin: 9 g/dL — ABNORMAL LOW (ref 12.0–16.0)
MCH: 30.3 pg (ref 26.0–34.0)
MCHC: 34.1 g/dL (ref 32.0–36.0)
MCV: 88.7 fL (ref 80.0–100.0)
Platelets: 231 10*3/uL (ref 150–440)
RBC: 2.96 MIL/uL — ABNORMAL LOW (ref 3.80–5.20)
RDW: 15.3 % — ABNORMAL HIGH (ref 11.5–14.5)
WBC: 7.7 10*3/uL (ref 3.6–11.0)

## 2018-04-25 LAB — MAGNESIUM: Magnesium: 1.7 mg/dL (ref 1.7–2.4)

## 2018-04-25 LAB — PREALBUMIN: Prealbumin: 7.6 mg/dL — ABNORMAL LOW (ref 18–38)

## 2018-04-25 LAB — PHOSPHORUS: Phosphorus: 3 mg/dL (ref 2.5–4.6)

## 2018-04-25 MED ORDER — SODIUM CHLORIDE 0.9 % IV SOLN
INTRAVENOUS | Status: DC
  Administered 2018-04-25 – 2018-04-26 (×2): via INTRAVENOUS

## 2018-04-25 MED ORDER — INSULIN ASPART 100 UNIT/ML ~~LOC~~ SOLN
0.0000 [IU] | Freq: Four times a day (QID) | SUBCUTANEOUS | Status: DC
  Administered 2018-04-26 – 2018-04-28 (×6): 1 [IU] via SUBCUTANEOUS
  Filled 2018-04-25 (×6): qty 1

## 2018-04-25 MED ORDER — FAT EMULSION PLANT BASED 20 % IV EMUL
250.0000 mL | INTRAVENOUS | Status: AC
  Administered 2018-04-25: 250 mL via INTRAVENOUS
  Filled 2018-04-25 (×2): qty 250

## 2018-04-25 MED ORDER — DIATRIZOATE MEGLUMINE & SODIUM 66-10 % PO SOLN
90.0000 mL | Freq: Once | ORAL | Status: AC
  Administered 2018-04-25: 90 mL via NASOGASTRIC

## 2018-04-25 MED ORDER — M.V.I. ADULT IV INJ
INJECTION | INTRAVENOUS | Status: AC
  Administered 2018-04-25: 18:00:00 via INTRAVENOUS
  Filled 2018-04-25: qty 1992

## 2018-04-25 NOTE — Progress Notes (Signed)
Nutrition Follow Up Note  DOCUMENTATION CODES:   Obesity unspecified  INTERVENTION:   Increase Clinimix 5/15 with electrolytes to goal rate of 68m/hr  Continue 20% lipids '@25ml' /hr x 12 hrs/day    Regimen provides 2014kcal/day, 100g/day protein, 22932mvolume   MVI daily   Trace elements daily    Pt at moderate refeeding risk; monitor P, K, and Mg daily until labs stabilize   Daily weights   Per MD, decrease IVF rate to 4556mr  NUTRITION DIAGNOSIS:   Inadequate oral intake related to acute illness as evidenced by NPO status.  GOAL:   Patient will meet greater than or equal to 90% of their needs  -met with TPN  MONITOR:   Diet advancement, Labs, Weight trends, Skin, I & O's  ASSESSMENT:   [age] female admitted with nausea/vomiting; admitted with high-grade SBO at anastomosis, managed conservatively with NGT, NPO diet. Pt s/p exploratory laparotomy with reduction of incarcerated ventral hernia.     Pt tolerating TPN well; advance to goal rate today. Recommend continue TPN until pt able to meet 60% of her estimated needs via oral intake.   Medications reviewed and include: heparin, synthroid, paxil, NaCl '@85ml' /hr, pepcid, hydromorphone   Labs reviewed: K 3.5 wnl, P 3.0 wnl, Mg 1.7 wnl Triglycerides- 147 cbgs- 110, 132, 108 x 24 hrs  Diet Order:   Diet Order           Diet NPO time specified  Diet effective now         EDUCATION NEEDS:   Education needs have been addressed  Skin:  Skin Assessment: Reviewed RN Assessment(incision abdomen )  Last BM:  6/21- type 6  Height:   Ht Readings from Last 1 Encounters:  04/22/18 '5\' 6"'  (1.676 m)    Weight:   Wt Readings from Last 1 Encounters:  04/25/18 226 lb (102.5 kg)    Ideal Body Weight:  59 kg  BMI:  Body mass index is 36.48 kg/m.  Estimated Nutritional Needs:   Kcal:  1700-2000kcal/day   Protein:  101-111g/day   Fluid:  >1.7L/day   CasKoleen Distance, RD, LDN Pager #-  [phone]#- [phone] Hours Pager: [phone]

## 2018-04-25 NOTE — Care Management Important Message (Signed)
Copy of signed IM left with patient in room.  

## 2018-04-25 NOTE — Progress Notes (Signed)
PHARMACY - ADULT TOTAL PARENTERAL NUTRITION CONSULT NOTE   Pharmacy Consult for TPN Indication: persistent N/V despite NG tube s/p exploratory laparotomy with reduction of incarcerated ventral hernia   Patient Measurements: Height: 5\' 6"  (167.6 cm) Weight: 226 lb (102.5 kg) IBW/kg (Calculated) : 59.3 TPN AdjBW (KG): 69.7 Body mass index is 36.48 kg/m.  Assessment: Pt reports poor appetite and oral intake since discharge last week but reports her last meal was Sunday morning which was a piece of toast. Pt reports that on Sunday, she started vomiting and having abdominal pain. Pt reports her abdominal pain is improved since NGT placement but she is still having some nausea which became worse 6/20 after receiving pain meds. She is tolerating TPN well as of this note.  Labs reviewed: K 3.5 wnl, P 3.0 wnl, Mg 1.7 wnl Triglycerides- 147 cbgs- 110, 132, 108 x 24 hrs  GI:  Endo: none Insulin requirements in the past 24 hours:  Lytes: Renal: Pulm: Cards:  Hepatobil: Neuro: ID:  TPN Access:PICC line ordered  TPN start date: 04/24/18 Nutritional Goals (per RD recommendation on 04/24/18): KCal: 1700-2000kcal/day Protein: 101-111g/day Fluid: >1.7L/day  Goal TPN rate is 83 ml/hr (provides 2014kcal/day, 100g/day protein, 2292ml volume at goal rate of 5583ml/hr)  Current Nutrition:   Plan:  TPN rate moved up to goal rate of 4783mL/hr. This TPN provides 100 g of protein, 299 g of dextrose, and 60 g of lipids which provides 2014 kCals per day, meeting 90% of patient needs Electrolytes in TPN: none added Add MVI, trace elements to TPN q6h sensitive scale SSI and adjust as needed (changed from q4h since levels are stable) NS IVMF decreased to 45 ml/hr Monitor TPN labs, daily F/U 6/22 am  Lowella Bandyodney D Kateryna Grantham, Pharm D 04/25/2018,10:37 AM

## 2018-04-25 NOTE — Progress Notes (Signed)
CC: SBO Subjective: No abd pain, NGT 600cc Passing flatus, tpn started Labs ok  Objective: Vital signs in last 24 hours: Temp:  [98 F (36.7 C)-98.8 F (37.1 C)] 98.4 F (36.9 C) (06/21 0521) Pulse Rate:  [77-87] 77 (06/21 0521) Resp:  [16-19] 19 (06/21 0521) BP: (133-146)/(59-66) 135/59 (06/21 0521) SpO2:  [98 %-100 %] 100 % (06/21 0521) Weight:  [102.5 kg (226 lb)] 102.5 kg (226 lb) (06/21 0827) Last BM Date: (pt unable to state)  Intake/Output from previous day: 06/20 0701 - 06/21 0700 In: 2785 [I.V.:2685; IV Piggyback:100] Out: 2150 [Urine:1550; Emesis/NG output:600] Intake/Output this shift: Total I/O In: -  Out: 600 [Urine:600]  Physical exam: NAD Abd: incision c/d/i, no infection. No peritonitis. Non tender.   Lab Results: CBC  Recent Labs    04/24/18 0522 04/25/18 0759  WBC 8.4 7.7  HGB 9.2* 9.0*  HCT 27.9* 26.3*  PLT 258 231   BMET Recent Labs    04/24/18 0522 04/25/18 0759  NA 142 141  K 3.8 3.5  CL 104 100*  CO2 33* 35*  GLUCOSE 113* 115*  BUN <5* 6  CREATININE 0.42* 0.40*  CALCIUM 8.2* 8.1*   PT/INR No results for input(s): LABPROT, INR in the last 72 hours. ABG No results for input(s): PHART, HCO3 in the last 72 hours.  Invalid input(s): PCO2, PO2  Studies/Results: Dg Abd Portable 2v  Result Date: 04/25/2018 CLINICAL DATA:  Small-bowel obstruction EXAM: PORTABLE ABDOMEN - 2 VIEW COMPARISON:  04/24/2018 FINDINGS: Scattered large and small bowel gas is noted. Slight improvement in the degree of small bowel dilatation is noted when compare with the prior exam. Nasogastric catheter remains in the stomach. Degenerative changes of lumbar spine are seen. No free air is noted. IMPRESSION: Improved but persistent small bowel dilatation. Continued follow-up is recommended. Electronically Signed   By: Alcide CleverMark  Lukens M.D.   On: 04/25/2018 07:11   Dg Abd Portable 2v  Result Date: 04/24/2018 CLINICAL DATA:  Small bowel obstruction EXAM: PORTABLE  ABDOMEN - 2 VIEW COMPARISON:  Yesterday FINDINGS: Nasogastric tube tip and side-port overlaps the stomach. Dilated small bowel in the left abdomen measuring up to 5 cm in diameter, unchanged. No concerning mass effect or gas collection. Mild atelectatic type opacity at the bases. IMPRESSION: Small bowel obstruction with unchanged distention when compared yesterday. Electronically Signed   By: Marnee SpringJonathon  Watts M.D.   On: 04/24/2018 07:32   [place] Ekg Site Rite  Result Date: 04/24/2018 If Site Rite image not attached, placement could not be confirmed due to current cardiac rhythm.   Anti-infectives: Anti-infectives (From admission, onward)   None      Assessment/Plan:  Post op SBO Clinically improving Gastrografin challenge No need for emergent surgical intervention   Sterling Bigiego Pabon, MD, [facility]  04/25/2018

## 2018-04-25 NOTE — Progress Notes (Signed)
Physical Therapy Treatment Patient Details Name: Kristen Schroeder MRN: [id number] DOB: 20-Dec-1940 Today's Date: 04/25/2018    History of Present Illness admitted with nausea/vomiting; admitted with high-grade SBO at anastomosis, managed conservatively with NGT, NPO diet. Of note, patient 10-days post-op exploratory laparotomy with reduction of incarcerated ventral hernia.      PT Comments    Pt making good progress with therapy. She is able to complete all supine exercises as instructed. She requires CGA only for transfers. Forward trunk flexion (extension limited by pain) noted during ambulation. Gait is slow and guarded.  Additional distance limited by fatigue. Cues to attempt more upright posture and stay within confines of walker. VSS throughout ambulation. Pt will benefit from PT services to address deficits in strength, balance, and mobility in order to return to full function at home.    Follow Up Recommendations  Home health PT     Equipment Recommendations  Rolling walker with 5" wheels;3in1 (PT)(tub transfer bench)    Recommendations for Other Services       Precautions / Restrictions Precautions Precautions: Fall Precaution Comments: NGT, NPO Restrictions Weight Bearing Restrictions: No    Mobility  Bed Mobility Overal bed mobility: Modified Independent             General bed mobility comments: HOB elevated, bed rails required, increased time but no external assistance  Transfers Overall transfer level: Needs assistance Equipment used: Rolling walker (2 wheeled) Transfers: Sit to/from Stand Sit to Stand: Min guard Stand pivot transfers: Min guard       General transfer comment: Pt with UE support to come to standing but stable once upright in standing  Ambulation/Gait Ambulation/Gait assistance: Min guard Gait Distance (Feet): 60 Feet Assistive device: Rolling walker (2 wheeled)       General Gait Details: forward trunk flexion (extension limited  by pain); slow and guarded.  Additional distance limited by fatigue. Cues to attempt more upright posture and stay within confines of walker. VSS throughout ambulation   Stairs             Wheelchair Mobility    Modified Rankin (Stroke Patients Only)       Balance Overall balance assessment: Needs assistance Sitting-balance support: No upper extremity supported;Feet supported Sitting balance-Leahy Scale: Good     Standing balance support: Bilateral upper extremity supported Standing balance-Leahy Scale: Fair                              Cognition Arousal/Alertness: Awake/alert Behavior During Therapy: WFL for tasks assessed/performed Overall Cognitive Status: Within Functional Limits for tasks assessed                                        Exercises General Exercises - Lower Extremity Ankle Circles/Pumps: Both;15 reps Quad Sets: Both;15 reps Gluteal Sets: Both;15 reps Heel Slides: Both;15 reps Hip ABduction/ADduction: Both;15 reps Straight Leg Raises: Both;15 reps    General Comments        Pertinent Vitals/Pain Pain Assessment: Faces Faces Pain Scale: Hurts little more Pain Location: Abdomen during movement. Pain Descriptors / Indicators: Aching;Grimacing;Guarding Pain Intervention(s): Monitored during session    Home Living                      Prior Function  PT Goals (current goals can now be found in the care plan section) Acute Rehab PT Goals Patient Stated Goal: to get better PT Goal Formulation: With patient/family Time For Goal Achievement: 05/07/18 Potential to Achieve Goals: Good Progress towards PT goals: Progressing toward goals    Frequency    Min 2X/week      PT Plan Discharge plan needs to be updated    Co-evaluation              AM-PAC PT "6 Clicks" Daily Activity  Outcome Measure  Difficulty turning over in bed (including adjusting bedclothes, sheets and  blankets)?: A Little Difficulty moving from lying on back to sitting on the side of the bed? : A Little Difficulty sitting down on and standing up from a chair with arms (e.g., wheelchair, bedside commode, etc,.)?: A Little Help needed moving to and from a bed to chair (including a wheelchair)?: A Little Help needed walking in hospital room?: A Little Help needed climbing 3-5 steps with a railing? : A Little 6 Click Score: 18    End of Session Equipment Utilized During Treatment: Gait belt;Oxygen Activity Tolerance: Patient tolerated treatment well Patient left: with call bell/phone within reach;with family/visitor present;in bed;with bed alarm set   PT Visit Diagnosis: Muscle weakness (generalized) (M62.81);Difficulty in walking, not elsewhere classified (R26.2);Pain     Time: [id number] PT Time Calculation (min) (ACUTE ONLY): 34 min  Charges:  $Gait Training: 8-22 mins $Therapeutic Exercise: 8-22 mins                    G Codes:       Sharalyn InkJason D Lulie Hurd PT, DPT     Mersedes Alber 04/25/2018, 12:33 PM

## 2018-04-25 NOTE — Care Management (Signed)
Per MD clinically SOB is improving.  Plan remains for patient to return home with home health when medically cleared.

## 2018-04-26 ENCOUNTER — Inpatient Hospital Stay: Payer: Medicare Other

## 2018-04-26 LAB — BASIC METABOLIC PANEL
Anion gap: 6 (ref 5–15)
BUN: 9 mg/dL (ref 6–20)
CO2: 35 mmol/L — ABNORMAL HIGH (ref 22–32)
Calcium: 8.2 mg/dL — ABNORMAL LOW (ref 8.9–10.3)
Chloride: 101 mmol/L (ref 101–111)
Creatinine, Ser: 0.38 mg/dL — ABNORMAL LOW (ref 0.44–1.00)
GFR calc Af Amer: >60 mL/min (ref >60)
GFR calc non Af Amer: >60 mL/min (ref >60)
Glucose, Bld: 117 mg/dL — ABNORMAL HIGH (ref 65–99)
Potassium: 3.5 mmol/L (ref 3.5–5.1)
Sodium: 142 mmol/L (ref 135–145)

## 2018-04-26 LAB — GLUCOSE, CAPILLARY
Glucose-Capillary: 101 mg/dL — ABNORMAL HIGH (ref 65–99)
Glucose-Capillary: 103 mg/dL — ABNORMAL HIGH (ref 65–99)
Glucose-Capillary: 125 mg/dL — ABNORMAL HIGH (ref 65–99)
Glucose-Capillary: 139 mg/dL — ABNORMAL HIGH (ref 65–99)

## 2018-04-26 LAB — ALBUMIN: Albumin: 2.6 g/dL — ABNORMAL LOW (ref 3.5–5.0)

## 2018-04-26 LAB — PHOSPHORUS: Phosphorus: 3.4 mg/dL (ref 2.5–4.6)

## 2018-04-26 LAB — MAGNESIUM: Magnesium: 1.6 mg/dL — ABNORMAL LOW (ref 1.7–2.4)

## 2018-04-26 MED ORDER — FAT EMULSION PLANT BASED 20 % IV EMUL
250.0000 mL | INTRAVENOUS | Status: AC
  Administered 2018-04-26: 250 mL via INTRAVENOUS
  Filled 2018-04-26 (×2): qty 250

## 2018-04-26 MED ORDER — TRACE MINERALS CR-CU-MN-SE-ZN 10-1000-500-60 MCG/ML IV SOLN
INTRAVENOUS | Status: AC
  Administered 2018-04-26: 20:00:00 via INTRAVENOUS
  Filled 2018-04-26: qty 1992

## 2018-04-26 MED ORDER — MAGNESIUM SULFATE 2 GM/50ML IV SOLN
2.0000 g | Freq: Once | INTRAVENOUS | Status: AC
  Administered 2018-04-26: 2 g via INTRAVENOUS
  Filled 2018-04-26: qty 50

## 2018-04-26 NOTE — Progress Notes (Signed)
PHARMACY - ADULT TOTAL PARENTERAL NUTRITION CONSULT NOTE   Pharmacy Consult for TPN Indication: persistent N/V despite NG tube s/p exploratory laparotomy with reduction of incarcerated ventral hernia   Patient Measurements: Height: 5\' 6"  (167.6 cm) Weight: 229 lb 11.5 oz (104.2 kg) IBW/kg (Calculated) : 59.3 TPN AdjBW (KG): 69.7 Body mass index is 37.08 kg/m.  Assessment: Pt reports poor appetite and oral intake since discharge last week but reports her last meal was Sunday morning which was a piece of toast. Pt reports that on Sunday, she started vomiting and having abdominal pain. Pt reports her abdominal pain is improved since NGT placement but she is still having some nausea which became worse 6/20 after receiving pain meds. She is tolerating TPN well as of this note.  Labs reviewed: K 3.5 wnl, P 3.4 wnl, Mg 1.6  Triglycerides- 147 cbgs- 99, 139, 103, 125 x 24 hrs  GI: NG tube has been removed  TPN Access:PICC line ordered  TPN start date: 04/24/18 Nutritional Goals (per RD recommendation on 04/24/18): KCal: 1700-2000kcal/day Protein: 101-111g/day Fluid: >1.7L/day  Goal TPN rate is 83 ml/hr (provides 2014kcal/day, 100g/day protein, 2292ml volume at goal rate of 3383ml/hr)  Current Nutrition:   Plan:  TPN (Clinimix E 5/15) at goal rate of 3083mL/hr. MD note states to continue TPN with plan to begin tapering off on 04/27/18. This TPN provides 100 g of protein, 299 g of dextrose, and 60 g of lipids which provides 2014 kCals per day, meeting 90% of patient needs Electrolytes in TPN: none added, Magnesium is slightly low at 1.6 but NG tube is being removed so expect GI losses to decrease. Add MVI, trace elements to TPN q6h sensitive scale SSI and adjust as needed (changed from q4h since levels are stable) NS IVMF decreased to 45 ml/hr Monitor TPN labs, daily F/U 6/23 am  Kristen Schroeder, PharmD, BCPS 04/26/2018 12:24 PM

## 2018-04-26 NOTE — Progress Notes (Signed)
Resolving early post op partial SBO Gastrografin challenged reviewed COntrast in colon Much improved xray this am + flatus and + BMs Avss   PE NAD Abd: soft, NT, incision c/d/i No tenderness , no peritonitis  A/P Doing well DC NGT Clears Ambulate Continue TPN for now start weaning tomorrow

## 2018-04-26 NOTE — Progress Notes (Signed)
Staples were removed per order with no issues noted. Benzoin and steri strips were also applied per order. Patient tolerated well.

## 2018-04-27 LAB — BASIC METABOLIC PANEL
Anion gap: 8 (ref 5–15)
BUN: 12 mg/dL (ref 6–20)
CO2: 33 mmol/L — ABNORMAL HIGH (ref 22–32)
Calcium: 8.3 mg/dL — ABNORMAL LOW (ref 8.9–10.3)
Chloride: 100 mmol/L — ABNORMAL LOW (ref 101–111)
Creatinine, Ser: 0.53 mg/dL (ref 0.44–1.00)
GFR calc Af Amer: >60 mL/min (ref >60)
GFR calc non Af Amer: >60 mL/min (ref >60)
Glucose, Bld: 117 mg/dL — ABNORMAL HIGH (ref 65–99)
Potassium: 4 mmol/L (ref 3.5–5.1)
Sodium: 141 mmol/L (ref 135–145)

## 2018-04-27 LAB — GLUCOSE, CAPILLARY
Glucose-Capillary: 131 mg/dL — ABNORMAL HIGH (ref 65–99)
Glucose-Capillary: 126 mg/dL — ABNORMAL HIGH (ref 65–99)
Glucose-Capillary: 126 mg/dL — ABNORMAL HIGH (ref 65–99)
Glucose-Capillary: 124 mg/dL — ABNORMAL HIGH (ref 65–99)
Glucose-Capillary: 113 mg/dL — ABNORMAL HIGH (ref 65–99)

## 2018-04-27 LAB — ALBUMIN: Albumin: 2.8 g/dL — ABNORMAL LOW (ref 3.5–5.0)

## 2018-04-27 LAB — PHOSPHORUS: Phosphorus: 3.5 mg/dL (ref 2.5–4.6)

## 2018-04-27 LAB — MAGNESIUM: Magnesium: 2 mg/dL (ref 1.7–2.4)

## 2018-04-27 MED ORDER — LISINOPRIL 10 MG PO TABS
10.0000 mg | ORAL_TABLET | Freq: Every day | ORAL | Status: DC
  Administered 2018-04-27 – 2018-04-29 (×3): 10 mg via ORAL
  Filled 2018-04-27 (×3): qty 1

## 2018-04-27 MED ORDER — ENSURE ENLIVE PO LIQD
237.0000 mL | Freq: Three times a day (TID) | ORAL | Status: DC
  Administered 2018-04-27 – 2018-04-28 (×3): 237 mL via ORAL

## 2018-04-27 MED ORDER — FAT EMULSION PLANT BASED 20 % IV EMUL
250.0000 mL | INTRAVENOUS | Status: AC
  Administered 2018-04-27: 250 mL via INTRAVENOUS
  Filled 2018-04-27 (×2): qty 250

## 2018-04-27 MED ORDER — TRACE MINERALS CR-CU-MN-SE-ZN 10-1000-500-60 MCG/ML IV SOLN
INTRAVENOUS | Status: AC
  Administered 2018-04-27: 20:00:00 via INTRAVENOUS
  Filled 2018-04-27: qty 960

## 2018-04-27 MED ORDER — FUROSEMIDE 20 MG PO TABS
20.0000 mg | ORAL_TABLET | Freq: Every day | ORAL | Status: DC
  Administered 2018-04-27 – 2018-04-29 (×3): 20 mg via ORAL
  Filled 2018-04-27 (×3): qty 1

## 2018-04-27 NOTE — Progress Notes (Signed)
PHARMACY - ADULT TOTAL PARENTERAL NUTRITION CONSULT NOTE   Pharmacy Consult for TPN Indication: persistent N/V despite NG tube s/p exploratory laparotomy with reduction of incarcerated ventral hernia   Patient Measurements: Height: 5\' 6"  (167.6 cm) Weight: 229 lb 11.5 oz (104.2 kg) IBW/kg (Calculated) : 59.3 TPN AdjBW (KG): 69.7 Body mass index is 37.08 kg/m.  Assessment: Pt reports poor appetite and oral intake since discharge last week but reports her last meal was Sunday morning which was a piece of toast. Pt reports that on Sunday, she started vomiting and having abdominal pain. Pt reports her abdominal pain is improved since NGT placement but she is still having some nausea which became worse 6/20 after receiving pain meds. She is tolerating TPN well as of this note. Rate has been decreased to half and plan is for TPN to be stopped on 04/28/18  Labs reviewed: K 4.0 wnl, P 3.5 wnl, Mg 2.0  Triglycerides- 147  GI: NG tube has been removed  TPN Access:PICC line ordered  TPN start date: 04/24/18 Nutritional Goals (per RD recommendation on 04/24/18): KCal: 1700-2000kcal/day Protein: 101-111g/day Fluid: >1.7L/day  Goal TPN rate is 83 ml/hr (provides 2014kcal/day, 100g/day protein, 2292ml volume at goal rate of 2283ml/hr)  Current Nutrition:   Plan:  TPN (Clinimix E 5/15) at goal rate of 5883mL/hr. MD note states to continue TPN with plan to begin tapering off on 04/27/18. Will decrease rate to 6540ml/hr . Add MVI, trace elements to TPN Monitor TPN labs, daily F/U 6/24 am  Clovia CuffLisa Shyla Gayheart, PharmD, BCPS 04/27/2018 3:17 PM

## 2018-04-27 NOTE — Progress Notes (Signed)
Nutrition Follow-up  DOCUMENTATION CODES:   Obesity unspecified  INTERVENTION:  Plan is to reduce TPN to 1/2 rate today. If patient continues to tolerate diet goal will be to discontinue TPN tomorrow.  Recommend decreasing Clinimix E 5/15 to 40 mL/hr and continue providing 20% ILE at 25 mL/hr over 12 hours. Continue adult MVI and trace elements as daily TPN additives.  Provide Ensure Enlive po TID, each supplement provides 350 kcal and 20 grams of protein.  Once TPN discontinued recommend daily MVI. While patient is on TPN she receives adult MVI as daily additive.  NUTRITION DIAGNOSIS:   Inadequate oral intake related to acute illness as evidenced by NPO status.  Improving as diet has been advanced to regular now. Intake remains inadequate, but addressing with addition of Ensure Enlive.  GOAL:   Patient will meet greater than or equal to 90% of their needs  Met with TPN; progressing with PO intake.  MONITOR:   Diet advancement, Labs, Weight trends, Skin, I & O's  REASON FOR ASSESSMENT:   Consult New TPN/TNA  ASSESSMENT:   77 y/o female admitted with nausea/vomiting; admitted with high-grade SBO at anastomosis, managed conservatively with NGT, NPO diet. Pt s/p exploratory laparotomy with reduction of incarcerated ventral hernia.     -NGT was removed on 6/22. -Diet was initially advanced to CLD on AM on 6/22 and then FLD prior to dinner on 6/22. -Advanced to regular diet this AM.  Met with patient at bedside. She has been tolerating TPN well. She reports she is feeling well today. Yesterday she had broth, gelatin, and juice and tolerated well. She was advanced to FLD before dinner but reports she does not normally eat pudding, yogurt, grits, or other items available on that diet. She has now been advanced to a regular diet. She slept through breakfast but is looking forward to trying regular food for lunch today. She denies any N/V or abdominal pain. She reports having  flatus and bowel movements now. She is amenable to drinking Ensure Enlive to help meet calorie/protein needs. She denies any food allergies or intolerances.  IV Access: right brachial double lumen PICC placed 04/24/2018; ECG was used to confirm PICC terminates in SVC per documentation  TPN: pt receiving Clinimix E 5/15 at 83 mL/hr + 20% ILE at 25 mL/hr over 12 hours; adult MVI and trace elements as daily additives  Medications reviewed and include: gabapentin, Novolog 0-9 units Q6hrs (received 3 units past 24 hrs), levothyroxine, famotidine.  Labs reviewed: CBG 101-131 past 24 hrs, Chloride 100, CO2 33.  I/O: unmeasured UOP yesterday; 4 occurrences BM yesterday  Weight trend: 104.2 kg on 6/23; +3.4 kg from admission  Discussed with RN. Also discussed with MD. Plan is to decrease TPN to 1/2 rate today. Okay to order Ensure for patient.  Diet Order:   Diet Order           Diet regular Room service appropriate? Yes; Fluid consistency: Thin  Diet effective now          EDUCATION NEEDS:   Education needs have been addressed  Skin:  Skin Assessment: Skin Integrity Issues:(closed incision to lower abdomen)  Last BM:  04/27/2018 - small type 6  Height:   Ht Readings from Last 1 Encounters:  04/22/18 '5\' 6"'  (1.676 m)    Weight:   Wt Readings from Last 1 Encounters:  04/27/18 229 lb 11.5 oz (104.2 kg)    Ideal Body Weight:  59 kg  BMI:  Body mass index  is 37.08 kg/m.  Estimated Nutritional Needs:   Kcal:  1700-2000kcal/day   Protein:  101-111g/day   Fluid:  >1.7L/day   Willey Blade, MS, RD, LDN Office: [phone] Pager: [phone] After Hours/Weekend Pager: [phone]

## 2018-04-27 NOTE — Progress Notes (Signed)
Early post op SBO resolving Well.  No abdominal pain.  Taking clear liquid diets adequately multiple bowel movements.  PE: NAD, incisions c/d/i. No peritonitis  A/p Doing well Resolving SBO Advance diet w supplemments Wean off TPN DC Monday or Tuesday depending on clinical condition Appreciate Dietician  Mrs. Zonia KiefStephens assistance

## 2018-04-28 LAB — COMPREHENSIVE METABOLIC PANEL
ALT: 7 U/L — ABNORMAL LOW (ref 14–54)
AST: 20 U/L (ref 15–41)
Albumin: 2.7 g/dL — ABNORMAL LOW (ref 3.5–5.0)
Alkaline Phosphatase: 54 U/L (ref 38–126)
Anion gap: 6 (ref 5–15)
BUN: 10 mg/dL (ref 6–20)
CO2: 33 mmol/L — ABNORMAL HIGH (ref 22–32)
Calcium: 8.4 mg/dL — ABNORMAL LOW (ref 8.9–10.3)
Chloride: 101 mmol/L (ref 101–111)
Creatinine, Ser: 0.41 mg/dL — ABNORMAL LOW (ref 0.44–1.00)
GFR calc Af Amer: >60 mL/min (ref >60)
GFR calc non Af Amer: >60 mL/min (ref >60)
Glucose, Bld: 120 mg/dL — ABNORMAL HIGH (ref 65–99)
Potassium: 4.1 mmol/L (ref 3.5–5.1)
Sodium: 140 mmol/L (ref 135–145)
Total Bilirubin: 0.2 mg/dL — ABNORMAL LOW (ref 0.3–1.2)
Total Protein: 5.7 g/dL — ABNORMAL LOW (ref 6.5–8.1)

## 2018-04-28 LAB — CBC
HCT: 27.7 % — ABNORMAL LOW (ref 35.0–47.0)
Hemoglobin: 9 g/dL — ABNORMAL LOW (ref 12.0–16.0)
MCH: 28.8 pg (ref 26.0–34.0)
MCHC: 32.3 g/dL (ref 32.0–36.0)
MCV: 89.1 fL (ref 80.0–100.0)
Platelets: 241 10*3/uL (ref 150–440)
RBC: 3.11 MIL/uL — ABNORMAL LOW (ref 3.80–5.20)
RDW: 15.5 % — ABNORMAL HIGH (ref 11.5–14.5)
WBC: 8.5 10*3/uL (ref 3.6–11.0)

## 2018-04-28 LAB — DIFFERENTIAL
Basophils Absolute: 0 10*3/uL (ref 0–0.1)
Basophils Relative: 0 %
Eosinophils Absolute: 0.2 10*3/uL (ref 0–0.7)
Eosinophils Relative: 3 %
Lymphocytes Relative: 9 %
Lymphs Abs: 0.7 10*3/uL — ABNORMAL LOW (ref 1.0–3.6)
Monocytes Absolute: 1 10*3/uL — ABNORMAL HIGH (ref 0.2–0.9)
Monocytes Relative: 12 %
Neutro Abs: 6.6 10*3/uL — ABNORMAL HIGH (ref 1.4–6.5)
Neutrophils Relative %: 76 %

## 2018-04-28 LAB — PREALBUMIN: Prealbumin: 11.1 mg/dL — ABNORMAL LOW (ref 18–38)

## 2018-04-28 LAB — GLUCOSE, CAPILLARY
Glucose-Capillary: 115 mg/dL — ABNORMAL HIGH (ref 65–99)
Glucose-Capillary: 116 mg/dL — ABNORMAL HIGH (ref 65–99)
Glucose-Capillary: 125 mg/dL — ABNORMAL HIGH (ref 65–99)
Glucose-Capillary: 138 mg/dL — ABNORMAL HIGH (ref 65–99)
Glucose-Capillary: 96 mg/dL (ref 65–99)

## 2018-04-28 LAB — TRIGLYCERIDES: Triglycerides: 224 mg/dL — ABNORMAL HIGH (ref <150)

## 2018-04-28 LAB — PHOSPHORUS: Phosphorus: 3.4 mg/dL (ref 2.5–4.6)

## 2018-04-28 LAB — MAGNESIUM: Magnesium: 1.9 mg/dL (ref 1.7–2.4)

## 2018-04-28 NOTE — Progress Notes (Signed)
SURGICAL PROGRESS NOTE  Hospital Day(s): 6.   Post op day(s):  Marland Kitchen.   Interval History: Patient seen and examined, no acute events or new complaints overnight. Patient reports slowly improving appetite and tolerating advancement of her diet, currently regular diet, with which she denies any abdominal pain, nausea, or vomiting. She continues to describe +flatus and +BM WNL. Patient's TPN was halved last night and is currently running at half of prior rate.  Review of Systems:  Constitutional: denies fever, chills  Respiratory: denies any shortness of breath  Cardiovascular: denies chest pain or palpitations  Gastrointestinal: abdominal pain, N/V, and bowel function as per interval history Musculoskeletal: denies pain, decreased motor or sensation  Vital signs in last 24 hours: [min-max] current  Temp:  [98.2 F (36.8 C)-98.5 F (36.9 C)] 98.3 F (36.8 C) (06/24 0637) Pulse Rate:  [75-85] 75 (06/24 0637) Resp:  [18-19] 18 (06/24 0637) BP: (120-156)/(60-72) 156/60 (06/24 0637) SpO2:  [97 %-100 %] 100 % (06/24 0637) Weight:  [225 lb 5 oz (102.2 kg)] 225 lb 5 oz (102.2 kg) (06/24 0500)     Height: 5\' 6"  (167.6 cm) Weight: 225 lb 5 oz (102.2 kg) BMI (Calculated): 36.38   Intake/Output this shift:  No intake/output data recorded.   Intake/Output last 2 shifts:  @IOLAST2SHIFTS @   Physical Exam:  Constitutional: alert, cooperative and no distress  Respiratory: breathing non-labored at rest  Cardiovascular: regular rate and sinus rhythm  Gastrointestinal: soft, non-tender, and non-distended, steri-strips applied and adherent where surgical skin staples recently removed  Labs:  CBC Latest Ref Rng & Units 04/28/2018 04/25/2018 04/24/2018  WBC 3.6 - 11.0 K/uL 8.5 7.7 8.4  Hemoglobin 12.0 - 16.0 g/dL 9.0(L) 9.0(L) 9.2(L)  Hematocrit 35.0 - 47.0 % 27.7(L) 26.3(L) 27.9(L)  Platelets 150 - 440 K/uL 241 231 258   CMP Latest Ref Rng & Units 04/28/2018 04/27/2018 04/26/2018  Glucose 65 - 99  mg/dL 161(W120(H) 960(A117(H) 540(J117(H)  BUN 6 - 20 mg/dL 10 12 9   Creatinine 0.44 - 1.00 mg/dL 8.11(B0.41(L) 1.470.53 8.29(F0.38(L)  Sodium 135 - 145 mmol/L 140 141 142  Potassium 3.5 - 5.1 mmol/L 4.1 4.0 3.5  Chloride 101 - 111 mmol/L 101 100(L) 101  CO2 22 - 32 mmol/L 33(H) 33(H) 35(H)  Calcium 8.9 - 10.3 mg/dL 6.2(Z8.4(L) 8.3(L) 8.2(L)  Total Protein 6.5 - 8.1 g/dL 3.0(Q5.7(L) - -  Total Bilirubin 0.3 - 1.2 mg/dL 6.5(H0.2(L) - -  Alkaline Phos 38 - 126 U/L 54 - -  AST 15 - 41 U/L 20 - -  ALT 14 - 54 U/L 7(L) - -    Assessment/Plan: 77 y.o. female with resolving early post-surgical bowel obstruction vs ileus s/p recent non-elective primary repair of incarcerated ventral hernia with small bowel resection and partial omentectomy, complicated by pertinent comorbidities including DM, HTN, cardiac arrhythmia, COPD on chronic home 3L supplemental oxygen via [place], hypothyroidism, and osteoarthritis.   - continue gradual advancement of diet as tolerated + oral nutritional supplements  - stop TPN tonight, minimize narcotic pain medications, and ambulation encouraged   - medical management of comorbidities (home medications)  - anticipate discharge home tomorrow   All of the above findings and recommendations were discussed with the patient, and all of patient's questions were answered to her expressed satisfaction.  -- Scherrie GerlachJason E. Earlene Plateravis, MD, RPVI [place]: [facility] General Surgery - Partnering for exceptional care. Office: [phone]

## 2018-04-28 NOTE — Care Management Important Message (Signed)
Copy of signed IM left with patient in room.  

## 2018-04-29 LAB — GLUCOSE, CAPILLARY
Glucose-Capillary: 106 mg/dL — ABNORMAL HIGH (ref 70–99)
Glucose-Capillary: 98 mg/dL (ref 70–99)

## 2018-04-29 NOTE — Care Management Note (Signed)
Case Management Note  Patient Details  Name: Gwyndolyn KaufmanRuthie L Sohm MRN: [id number] Date of Birth: 11-10-1940   Resumption orders placed for PT.  Barbara CowerJason with [facility] notified. Husband brought portable o2 for discharge.   Subjective/Objective:                    Action/Plan:   Expected Discharge Date:  04/29/18               Expected Discharge Plan:  Home w Home Health Services  In-House Referral:     Discharge planning Services  CM Consult  Post Acute Care Choice:  Resumption of Svcs/PTA Provider Choice offered to:     DME Arranged:    DME Agency:     HH Arranged:  PT HH Agency:  [facility]  Status of Service:  Completed, signed off  If discussed at Long Length of Stay Meetings, dates discussed:    Additional Comments:  Chapman FitchBOWEN, Nancy Arvin T, RN 04/29/2018, 2:41 PM

## 2018-04-29 NOTE — Progress Notes (Signed)
Discharge instructions reviewed with patient and husband.  Understanding was verbalized and all questions were answered.  PICC line as well as IV removed without complications and patient tolerated well.  Patient discharged via wheelchair in stable condition escorted by nursing staff.

## 2018-05-02 ENCOUNTER — Encounter: Payer: Self-pay | Admitting: Surgery

## 2018-05-02 ENCOUNTER — Ambulatory Visit (INDEPENDENT_AMBULATORY_CARE_PROVIDER_SITE_OTHER): Payer: Medicare Other | Admitting: Surgery

## 2018-05-02 VITALS — BP 128/74 | HR 86 | Temp 97.6°F

## 2018-05-02 DIAGNOSIS — Z09 Encounter for follow-up examination after completed treatment for conditions other than malignant neoplasm: Secondary | ICD-10-CM

## 2018-05-02 NOTE — Progress Notes (Signed)
S/p lap Ventral Hernia repair w SB obstruction Developed early SBO that was amanaged medically SHe has no complaints today + PO, + BM   PE NAD Abd: soft, nt, incisions c/d/i. No recurrence or infection  A/P Doing very well D/w her about the hernia coming back given that was emergent primary repair F/U prn

## 2018-05-02 NOTE — Patient Instructions (Signed)

## 2018-05-03 NOTE — Discharge Summary (Signed)
Physician Discharge Summary  Patient ID: Kristen KaufmanRuthie L Schroeder MRN: [id number] DOB/AGE: 1941-09-23 77 y.o.  Admit date: 04/13/2018 Discharge date: 04/19/2018  Admission Diagnoses:  Discharge Diagnoses:  Active Problems:   Incarcerated hernia   Discharged Condition: fair  Hospital Course: 77 y.o. female with COPD on 3L home oxygen via nasal canula [facility] for abdominal pain, nausea, and non-bloody emesis. Workup was found to be significant for small bowel obstruction attributable to patient's incarcerated ventral hernia. Informed consent was obtained and documented, and patient underwent emergent repair of her incarcerated ventral hernia with small bowel resection (Pabon, 04/14/2018).  Post-operatively, patient's pain and her post-surgical ileus both improved/resolved, and advancement of patient's diet and ambulation were well-tolerated. The remainder of patient's hospital course was essentially unremarkable, and discharge planning was initiated accordingly with patient safely able to be discharged home with appropriate discharge instructions, pain control, and outpatient surgical follow-up after all of her and family's questions were answered to their expressed satisfaction.  Consults: None  Significant Diagnostic Studies: radiology: CT scan: SBO attributable to incarcerated ventral abdominal wall hernia  Treatments: surgery: emergent open primary repair of incarcerated ventral abdominal wall hernia with small bowel resection (Pabon, 04/14/2018)  Discharge Exam: Blood pressure (!) 133/53, pulse 82, temperature 98.1 F (36.7 C), temperature source Oral, resp. rate 20, height 5\' 8"  (1.727 m), weight 222 lb 11.2 oz (101 kg), SpO2 98 %. General appearance: alert, cooperative and no distress Resp: clear to auscultation bilaterally on baseline 3L supplemental home oxygen via nasal cannula GI: abdomen soft and non-distended with mild peri-incisional tenderness to palpation, incision  well-approximated without surrounding erythema or drainage  Disposition: Home   Allergies as of 04/19/2018      Reactions   Codeine Nausea And Vomiting, Nausea Only      Medication List    TAKE these medications   arformoterol 15 MCG/2ML Nebu Commonly known as:  BROVANA Take 2 mLs (15 mcg total) by nebulization 2 (two) times daily.   aspirin EC 81 MG tablet Take 81 mg by mouth daily.   azithromycin 250 MG tablet Commonly known as:  ZITHROMAX Take 2 day 1 and then 1 daily   DALIRESP 500 MCG Tabs tablet Generic drug:  roflumilast Take 500 mcg by mouth daily.   fluticasone 50 MCG/ACT nasal spray Commonly known as:  FLONASE Place 1 spray into both nostrils daily as needed.   furosemide 20 MG tablet Commonly known as:  LASIX TAKE 1 TABLET BY MOUTH DAILY   gabapentin 300 MG capsule Commonly known as:  NEURONTIN 2 tablets in the morning, 1 tablet in the afternoon and 2 tablets in the evening   ipratropium-albuterol 0.5-2.5 (3) MG/3ML Soln Commonly known as:  DUONEB Take 3 mLs by nebulization every 6 (six) hours as needed.   levothyroxine 125 MCG tablet Commonly known as:  SYNTHROID, LEVOTHROID TAKE 1 TABLET BY MOUTH DAILY   lisinopril 20 MG tablet Commonly known as:  PRINIVIL,ZESTRIL Take 1 tablet (20 mg total) by mouth daily.   loratadine 10 MG tablet Commonly known as:  CLARITIN TAKE 1 TABLET (10 MG TOTAL) BY MOUTH DAILY.   LORazepam 0.5 MG tablet Commonly known as:  ATIVAN TAKE 1 TABLET BY MOUTH 2 TIMES DAILY AS NEEDED FOR ANXIETY   metFORMIN 1000 MG tablet Commonly known as:  GLUCOPHAGE TAKE 1 TABLET BY MOUTH TWICE A DAY What changed:    how much to take  how to take this  when to take this   mometasone-formoterol 100-5  MCG/ACT Aero Commonly known as:  DULERA Inhale 2 puffs into the lungs 2 (two) times daily.   MULTIPLE VITAMIN PO Take 1 tablet by mouth daily.   omeprazole 40 MG capsule Commonly known as:  PRILOSEC Take 1 capsule (40 mg  total) by mouth daily.   ondansetron 4 MG disintegrating tablet Commonly known as:  ZOFRAN-ODT TAKE 1 TABLET BY MOUTH EVERY 8 HOURS AS NEEDED FOR NAUSEA AND VOMITING   ONE TOUCH ULTRA TEST test strip Generic drug:  glucose blood USE 1 STRIP 2 TIMES DAILY AND AS NEEDED   oxyCODONE 5 MG immediate release tablet Commonly known as:  Oxy IR/ROXICODONE Take 1 tablet (5 mg total) by mouth every 4 (four) hours as needed for severe pain.   PARoxetine 10 MG tablet Commonly known as:  PAXIL Take 1 tablet (10 mg total) at bedtime by mouth.   pravastatin 10 MG tablet Commonly known as:  PRAVACHOL TAKE 1 TABLET (10 MG TOTAL) BY MOUTH DAILY.   salmeterol 50 MCG/DOSE diskus inhaler Commonly known as:  SEREVENT Inhale 1 puff into the lungs daily.      Follow-up Information    Pabon, [place] F, MD. Schedule an appointment as soon as possible for a visit in 2 weeks.   Specialty:  General Surgery Contact information: [address] [place] [address] [phone]           Signed: Ancil Linsey 05/03/2018, 1:52 PM

## 2018-05-06 ENCOUNTER — Inpatient Hospital Stay: Payer: Medicare Other | Admitting: Surgery

## 2018-05-06 ENCOUNTER — Other Ambulatory Visit: Payer: Self-pay | Admitting: Family Medicine

## 2018-05-06 ENCOUNTER — Telehealth: Payer: Self-pay | Admitting: Family Medicine

## 2018-05-06 DIAGNOSIS — I110 Hypertensive heart disease with heart failure: Secondary | ICD-10-CM | POA: Diagnosis not present

## 2018-05-06 DIAGNOSIS — Z87891 Personal history of nicotine dependence: Secondary | ICD-10-CM | POA: Diagnosis not present

## 2018-05-06 DIAGNOSIS — Z7984 Long term (current) use of oral hypoglycemic drugs: Secondary | ICD-10-CM | POA: Diagnosis not present

## 2018-05-06 DIAGNOSIS — E785 Hyperlipidemia, unspecified: Secondary | ICD-10-CM | POA: Diagnosis not present

## 2018-05-06 DIAGNOSIS — I509 Heart failure, unspecified: Secondary | ICD-10-CM | POA: Diagnosis not present

## 2018-05-06 DIAGNOSIS — Z7982 Long term (current) use of aspirin: Secondary | ICD-10-CM | POA: Diagnosis not present

## 2018-05-06 DIAGNOSIS — E1140 Type 2 diabetes mellitus with diabetic neuropathy, unspecified: Secondary | ICD-10-CM | POA: Diagnosis not present

## 2018-05-06 DIAGNOSIS — J449 Chronic obstructive pulmonary disease, unspecified: Secondary | ICD-10-CM | POA: Diagnosis not present

## 2018-05-06 DIAGNOSIS — K436 Other and unspecified ventral hernia with obstruction, without gangrene: Secondary | ICD-10-CM | POA: Diagnosis not present

## 2018-05-06 DIAGNOSIS — Z9049 Acquired absence of other specified parts of digestive tract: Secondary | ICD-10-CM | POA: Diagnosis not present

## 2018-05-06 DIAGNOSIS — Z7951 Long term (current) use of inhaled steroids: Secondary | ICD-10-CM | POA: Diagnosis not present

## 2018-05-06 DIAGNOSIS — M5137 Other intervertebral disc degeneration, lumbosacral region: Secondary | ICD-10-CM | POA: Diagnosis not present

## 2018-05-06 DIAGNOSIS — Z48815 Encounter for surgical aftercare following surgery on the digestive system: Secondary | ICD-10-CM | POA: Diagnosis not present

## 2018-05-06 DIAGNOSIS — K219 Gastro-esophageal reflux disease without esophagitis: Secondary | ICD-10-CM | POA: Diagnosis not present

## 2018-05-06 DIAGNOSIS — G4733 Obstructive sleep apnea (adult) (pediatric): Secondary | ICD-10-CM | POA: Diagnosis not present

## 2018-05-06 DIAGNOSIS — Z9981 Dependence on supplemental oxygen: Secondary | ICD-10-CM | POA: Diagnosis not present

## 2018-05-06 NOTE — Telephone Encounter (Signed)
Kristen Schroeder with [facility] is requesting verbal orders for in home physical therapy as follows:  Twice a week for 2 weeks Once a week for one week  Please advise. Thanks TNP

## 2018-05-06 NOTE — Discharge Summary (Signed)
Physician Discharge Summary  Patient ID: Kristen KaufmanRuthie L Schroeder MRN: [id number] DOB/AGE: 03-15-1941 77 y.o.  Admit date: 04/21/2018 Discharge date: 05/06/2018  Admission Diagnoses:  Discharge Diagnoses:  Active Problems:   SBO (small bowel obstruction) (HCC)   Discharged Condition: good  Hospital Course: 77 y.o. female presented [facility] ED 8 days s/p primary repair of incarcerated ventral hernia with small bowel resection for abdominal pain with nausea and non-bloody emesis. Workup was found to be significant for CT imaging demonstrating small bowel obstruction. NG tube was inserted , and patient's pain and nausea improved/resolved, bowel function resumed, NG tube was removed, and advancement of patient's diet was well-tolerated. The remainder of patient's hospital course was essentially unremarkable, and discharge planning was initiated accordingly with patient safely able to be discharged home with appropriate discharge instructions and outpatient surgical follow-up after all of her questions were answered to her expressed satisfaction.  Consults: None  Significant Diagnostic Studies: radiology: CT scan: small bowel obstruction  Treatments: IV hydration and placement of nasogastric tube  Discharge Exam: Blood pressure (!) 132/59, pulse 75, temperature 98 F (36.7 C), temperature source Oral, resp. rate 18, height 5\' 6"  (1.676 m), weight 224 lb 6.4 oz (101.8 kg), SpO2 100 %. General appearance: alert, cooperative and no distress GI: soft, non-tender, and non-distended with incision well-approximated without any surrounding erythema or drainage  Disposition:    Allergies as of 04/29/2018      Reactions   Codeine Nausea And Vomiting, Nausea Only      Medication List    TAKE these medications   arformoterol 15 MCG/2ML Nebu Commonly known as:  BROVANA Take 2 mLs (15 mcg total) by nebulization 2 (two) times daily.   aspirin EC 81 MG tablet Take 81 mg by mouth daily.   azithromycin 250  MG tablet Commonly known as:  ZITHROMAX Take 2 day 1 and then 1 daily   DALIRESP 500 MCG Tabs tablet Generic drug:  roflumilast Take 500 mcg by mouth daily.   fluticasone 50 MCG/ACT nasal spray Commonly known as:  FLONASE Place 1 spray into both nostrils daily as needed.   furosemide 20 MG tablet Commonly known as:  LASIX TAKE 1 TABLET BY MOUTH DAILY   gabapentin 300 MG capsule Commonly known as:  NEURONTIN 2 tablets in the morning, 1 tablet in the afternoon and 2 tablets in the evening   ipratropium-albuterol 0.5-2.5 (3) MG/3ML Soln Commonly known as:  DUONEB Take 3 mLs by nebulization every 6 (six) hours as needed.   isosorbide mononitrate 30 MG 24 hr tablet Commonly known as:  IMDUR Take 30 mg by mouth daily as needed (high bp).   levothyroxine 125 MCG tablet Commonly known as:  SYNTHROID, LEVOTHROID TAKE 1 TABLET BY MOUTH DAILY   lisinopril 20 MG tablet Commonly known as:  PRINIVIL,ZESTRIL Take 1 tablet (20 mg total) by mouth daily.   loratadine 10 MG tablet Commonly known as:  CLARITIN TAKE 1 TABLET (10 MG TOTAL) BY MOUTH DAILY.   LORazepam 0.5 MG tablet Commonly known as:  ATIVAN TAKE 1 TABLET BY MOUTH 2 TIMES DAILY AS NEEDED FOR ANXIETY   metFORMIN 1000 MG tablet Commonly known as:  GLUCOPHAGE TAKE 1 TABLET BY MOUTH TWICE A DAY What changed:    how much to take  how to take this  when to take this   mometasone-formoterol 100-5 MCG/ACT Aero Commonly known as:  DULERA Inhale 2 puffs into the lungs 2 (two) times daily.   MULTIPLE VITAMIN PO Take 1  tablet by mouth daily.   omeprazole 40 MG capsule Commonly known as:  PRILOSEC Take 1 capsule (40 mg total) by mouth daily.   ondansetron 4 MG disintegrating tablet Commonly known as:  ZOFRAN-ODT TAKE 1 TABLET BY MOUTH EVERY 8 HOURS AS NEEDED FOR NAUSEA AND VOMITING   ONE TOUCH ULTRA TEST test strip Generic drug:  glucose blood USE 1 STRIP 2 TIMES DAILY AND AS NEEDED   oxyCODONE 5 MG immediate  release tablet Commonly known as:  Oxy IR/ROXICODONE Take 1 tablet (5 mg total) by mouth every 4 (four) hours as needed for severe pain.   PARoxetine 10 MG tablet Commonly known as:  PAXIL Take 1 tablet (10 mg total) at bedtime by mouth.   pravastatin 10 MG tablet Commonly known as:  PRAVACHOL TAKE 1 TABLET (10 MG TOTAL) BY MOUTH DAILY.   salmeterol 50 MCG/DOSE diskus inhaler Commonly known as:  SEREVENT Inhale 1 puff into the lungs daily.      Follow-up Information    [facility]. Go on 05/06/2018.   Specialty:  General Surgery Why:  Tuesday July 2nd at 3:30pm for a follow-up appointment with Dr. Otila Back information: [address] [place] [address] [phone]          Signed: Ancil Linsey 05/06/2018, 12:04 AM

## 2018-05-07 NOTE — Telephone Encounter (Signed)
Advised  ED 

## 2018-05-09 DIAGNOSIS — Z9049 Acquired absence of other specified parts of digestive tract: Secondary | ICD-10-CM | POA: Diagnosis not present

## 2018-05-09 DIAGNOSIS — J449 Chronic obstructive pulmonary disease, unspecified: Secondary | ICD-10-CM | POA: Diagnosis not present

## 2018-05-09 DIAGNOSIS — Z7984 Long term (current) use of oral hypoglycemic drugs: Secondary | ICD-10-CM | POA: Diagnosis not present

## 2018-05-09 DIAGNOSIS — G4733 Obstructive sleep apnea (adult) (pediatric): Secondary | ICD-10-CM | POA: Diagnosis not present

## 2018-05-09 DIAGNOSIS — K436 Other and unspecified ventral hernia with obstruction, without gangrene: Secondary | ICD-10-CM | POA: Diagnosis not present

## 2018-05-09 DIAGNOSIS — Z87891 Personal history of nicotine dependence: Secondary | ICD-10-CM | POA: Diagnosis not present

## 2018-05-09 DIAGNOSIS — Z48815 Encounter for surgical aftercare following surgery on the digestive system: Secondary | ICD-10-CM | POA: Diagnosis not present

## 2018-05-09 DIAGNOSIS — M5137 Other intervertebral disc degeneration, lumbosacral region: Secondary | ICD-10-CM | POA: Diagnosis not present

## 2018-05-09 DIAGNOSIS — K219 Gastro-esophageal reflux disease without esophagitis: Secondary | ICD-10-CM | POA: Diagnosis not present

## 2018-05-09 DIAGNOSIS — E1140 Type 2 diabetes mellitus with diabetic neuropathy, unspecified: Secondary | ICD-10-CM | POA: Diagnosis not present

## 2018-05-09 DIAGNOSIS — E785 Hyperlipidemia, unspecified: Secondary | ICD-10-CM | POA: Diagnosis not present

## 2018-05-09 DIAGNOSIS — Z7951 Long term (current) use of inhaled steroids: Secondary | ICD-10-CM | POA: Diagnosis not present

## 2018-05-09 DIAGNOSIS — Z7982 Long term (current) use of aspirin: Secondary | ICD-10-CM | POA: Diagnosis not present

## 2018-05-09 DIAGNOSIS — I509 Heart failure, unspecified: Secondary | ICD-10-CM | POA: Diagnosis not present

## 2018-05-09 DIAGNOSIS — Z9981 Dependence on supplemental oxygen: Secondary | ICD-10-CM | POA: Diagnosis not present

## 2018-05-09 DIAGNOSIS — I110 Hypertensive heart disease with heart failure: Secondary | ICD-10-CM | POA: Diagnosis not present

## 2018-05-13 DIAGNOSIS — K436 Other and unspecified ventral hernia with obstruction, without gangrene: Secondary | ICD-10-CM | POA: Diagnosis not present

## 2018-05-13 DIAGNOSIS — Z7951 Long term (current) use of inhaled steroids: Secondary | ICD-10-CM | POA: Diagnosis not present

## 2018-05-13 DIAGNOSIS — K219 Gastro-esophageal reflux disease without esophagitis: Secondary | ICD-10-CM | POA: Diagnosis not present

## 2018-05-13 DIAGNOSIS — J449 Chronic obstructive pulmonary disease, unspecified: Secondary | ICD-10-CM | POA: Diagnosis not present

## 2018-05-13 DIAGNOSIS — Z7982 Long term (current) use of aspirin: Secondary | ICD-10-CM | POA: Diagnosis not present

## 2018-05-13 DIAGNOSIS — Z7984 Long term (current) use of oral hypoglycemic drugs: Secondary | ICD-10-CM | POA: Diagnosis not present

## 2018-05-13 DIAGNOSIS — Z9981 Dependence on supplemental oxygen: Secondary | ICD-10-CM | POA: Diagnosis not present

## 2018-05-13 DIAGNOSIS — M5137 Other intervertebral disc degeneration, lumbosacral region: Secondary | ICD-10-CM | POA: Diagnosis not present

## 2018-05-13 DIAGNOSIS — G4733 Obstructive sleep apnea (adult) (pediatric): Secondary | ICD-10-CM | POA: Diagnosis not present

## 2018-05-13 DIAGNOSIS — Z87891 Personal history of nicotine dependence: Secondary | ICD-10-CM | POA: Diagnosis not present

## 2018-05-13 DIAGNOSIS — E785 Hyperlipidemia, unspecified: Secondary | ICD-10-CM | POA: Diagnosis not present

## 2018-05-13 DIAGNOSIS — E1140 Type 2 diabetes mellitus with diabetic neuropathy, unspecified: Secondary | ICD-10-CM | POA: Diagnosis not present

## 2018-05-13 DIAGNOSIS — Z9049 Acquired absence of other specified parts of digestive tract: Secondary | ICD-10-CM | POA: Diagnosis not present

## 2018-05-13 DIAGNOSIS — I509 Heart failure, unspecified: Secondary | ICD-10-CM | POA: Diagnosis not present

## 2018-05-13 DIAGNOSIS — Z48815 Encounter for surgical aftercare following surgery on the digestive system: Secondary | ICD-10-CM | POA: Diagnosis not present

## 2018-05-13 DIAGNOSIS — I110 Hypertensive heart disease with heart failure: Secondary | ICD-10-CM | POA: Diagnosis not present

## 2018-05-14 ENCOUNTER — Other Ambulatory Visit: Payer: Self-pay

## 2018-05-14 DIAGNOSIS — G629 Polyneuropathy, unspecified: Secondary | ICD-10-CM

## 2018-05-14 DIAGNOSIS — J449 Chronic obstructive pulmonary disease, unspecified: Secondary | ICD-10-CM | POA: Diagnosis not present

## 2018-05-14 MED ORDER — GABAPENTIN 300 MG PO CAPS: ORAL_CAPSULE | ORAL | 3 refills | Status: DC

## 2018-05-15 DIAGNOSIS — E1140 Type 2 diabetes mellitus with diabetic neuropathy, unspecified: Secondary | ICD-10-CM | POA: Diagnosis not present

## 2018-05-15 DIAGNOSIS — Z87891 Personal history of nicotine dependence: Secondary | ICD-10-CM | POA: Diagnosis not present

## 2018-05-15 DIAGNOSIS — K436 Other and unspecified ventral hernia with obstruction, without gangrene: Secondary | ICD-10-CM | POA: Diagnosis not present

## 2018-05-15 DIAGNOSIS — I110 Hypertensive heart disease with heart failure: Secondary | ICD-10-CM | POA: Diagnosis not present

## 2018-05-15 DIAGNOSIS — Z7951 Long term (current) use of inhaled steroids: Secondary | ICD-10-CM | POA: Diagnosis not present

## 2018-05-15 DIAGNOSIS — Z9049 Acquired absence of other specified parts of digestive tract: Secondary | ICD-10-CM | POA: Diagnosis not present

## 2018-05-15 DIAGNOSIS — I509 Heart failure, unspecified: Secondary | ICD-10-CM | POA: Diagnosis not present

## 2018-05-15 DIAGNOSIS — Z48815 Encounter for surgical aftercare following surgery on the digestive system: Secondary | ICD-10-CM | POA: Diagnosis not present

## 2018-05-15 DIAGNOSIS — M5137 Other intervertebral disc degeneration, lumbosacral region: Secondary | ICD-10-CM | POA: Diagnosis not present

## 2018-05-15 DIAGNOSIS — J449 Chronic obstructive pulmonary disease, unspecified: Secondary | ICD-10-CM | POA: Diagnosis not present

## 2018-05-15 DIAGNOSIS — G4733 Obstructive sleep apnea (adult) (pediatric): Secondary | ICD-10-CM | POA: Diagnosis not present

## 2018-05-15 DIAGNOSIS — Z7982 Long term (current) use of aspirin: Secondary | ICD-10-CM | POA: Diagnosis not present

## 2018-05-15 DIAGNOSIS — Z9981 Dependence on supplemental oxygen: Secondary | ICD-10-CM | POA: Diagnosis not present

## 2018-05-15 DIAGNOSIS — K219 Gastro-esophageal reflux disease without esophagitis: Secondary | ICD-10-CM | POA: Diagnosis not present

## 2018-05-15 DIAGNOSIS — Z7984 Long term (current) use of oral hypoglycemic drugs: Secondary | ICD-10-CM | POA: Diagnosis not present

## 2018-05-15 DIAGNOSIS — E785 Hyperlipidemia, unspecified: Secondary | ICD-10-CM | POA: Diagnosis not present

## 2018-05-16 ENCOUNTER — Other Ambulatory Visit: Payer: Self-pay | Admitting: Family Medicine

## 2018-05-16 ENCOUNTER — Other Ambulatory Visit: Payer: Self-pay | Admitting: Nurse Practitioner

## 2018-05-16 DIAGNOSIS — I10 Essential (primary) hypertension: Secondary | ICD-10-CM

## 2018-05-16 DIAGNOSIS — F419 Anxiety disorder, unspecified: Secondary | ICD-10-CM

## 2018-05-16 DIAGNOSIS — R600 Localized edema: Secondary | ICD-10-CM

## 2018-05-16 MED ORDER — IPRATROPIUM-ALBUTEROL 0.5-2.5 (3) MG/3ML IN SOLN: 3.0000 mL | Freq: Four times a day (QID) | RESPIRATORY_TRACT | 1 refills | Status: DC | PRN

## 2018-05-16 NOTE — Telephone Encounter (Signed)
[facility] faxed refill request for the following medications:  1. furosemide (LASIX) 20 MG tablet 2. gabapentin (NEURONTIN) 300 MG capsule 3. levothyroxine (SYNTHROID, LEVOTHROID) 125 MCG tablet 4. PARoxetine (PAXIL) 10 MG tablet 5. pravastatin (PRAVACHOL) 10 MG tablet 6. lisinopril (PRINIVIL,ZESTRIL) 20 MG tablet 7. LORazepam (ATIVAN) 0.5 MG tablet  8. metFORMIN (GLUCOPHAGE) 1000 MG tablet  90 day supply   LOV: 04/01/18 NOV: 09/24/18 Please advise. Thanks TNP

## 2018-05-16 NOTE — Telephone Encounter (Signed)
[facility] faxed refill request for the following medications:  1. furosemide (LASIX) 20 MG tablet 2. gabapentin (NEURONTIN) 300 MG capsule 3. levothyroxine (SYNTHROID, LEVOTHROID) 125 MCG tablet  90 day supply  LOV: 04/01/18 NOV: 09/24/18 Please advise. Thanks TNP

## 2018-05-19 ENCOUNTER — Ambulatory Visit (INDEPENDENT_AMBULATORY_CARE_PROVIDER_SITE_OTHER): Payer: Medicare Other

## 2018-05-19 VITALS — BP 116/65 | HR 87 | Temp 98.1°F

## 2018-05-19 DIAGNOSIS — Z9049 Acquired absence of other specified parts of digestive tract: Secondary | ICD-10-CM | POA: Diagnosis not present

## 2018-05-19 DIAGNOSIS — K219 Gastro-esophageal reflux disease without esophagitis: Secondary | ICD-10-CM | POA: Diagnosis not present

## 2018-05-19 DIAGNOSIS — K436 Other and unspecified ventral hernia with obstruction, without gangrene: Secondary | ICD-10-CM | POA: Diagnosis not present

## 2018-05-19 DIAGNOSIS — J449 Chronic obstructive pulmonary disease, unspecified: Secondary | ICD-10-CM | POA: Diagnosis not present

## 2018-05-19 DIAGNOSIS — I110 Hypertensive heart disease with heart failure: Secondary | ICD-10-CM | POA: Diagnosis not present

## 2018-05-19 DIAGNOSIS — E785 Hyperlipidemia, unspecified: Secondary | ICD-10-CM | POA: Diagnosis not present

## 2018-05-19 DIAGNOSIS — Z48815 Encounter for surgical aftercare following surgery on the digestive system: Secondary | ICD-10-CM | POA: Diagnosis not present

## 2018-05-19 DIAGNOSIS — M5137 Other intervertebral disc degeneration, lumbosacral region: Secondary | ICD-10-CM | POA: Diagnosis not present

## 2018-05-19 DIAGNOSIS — Z4802 Encounter for removal of sutures: Secondary | ICD-10-CM

## 2018-05-19 DIAGNOSIS — Z9981 Dependence on supplemental oxygen: Secondary | ICD-10-CM | POA: Diagnosis not present

## 2018-05-19 DIAGNOSIS — Z87891 Personal history of nicotine dependence: Secondary | ICD-10-CM | POA: Diagnosis not present

## 2018-05-19 DIAGNOSIS — Z7982 Long term (current) use of aspirin: Secondary | ICD-10-CM | POA: Diagnosis not present

## 2018-05-19 DIAGNOSIS — E1140 Type 2 diabetes mellitus with diabetic neuropathy, unspecified: Secondary | ICD-10-CM | POA: Diagnosis not present

## 2018-05-19 DIAGNOSIS — G4733 Obstructive sleep apnea (adult) (pediatric): Secondary | ICD-10-CM | POA: Diagnosis not present

## 2018-05-19 DIAGNOSIS — Z7984 Long term (current) use of oral hypoglycemic drugs: Secondary | ICD-10-CM | POA: Diagnosis not present

## 2018-05-19 DIAGNOSIS — I509 Heart failure, unspecified: Secondary | ICD-10-CM | POA: Diagnosis not present

## 2018-05-19 DIAGNOSIS — Z7951 Long term (current) use of inhaled steroids: Secondary | ICD-10-CM | POA: Diagnosis not present

## 2018-05-19 NOTE — Patient Instructions (Signed)
Please give us a call in case you have any questions or concerns.  

## 2018-05-19 NOTE — Progress Notes (Signed)
Patient came in to the office since she had noticed that she had one staple remaining on her incision site. The staple was removed as well as the remaining steri-strips and patient tolerated well. I then place a 4x4 piece of gauze with paper tape to secure it. Patient wanted to know if she was able to take a shower and drive. I told her that she was able to shower and able to drive if she was able to move her head and body to her sides and able to see her surroundings without pain or if she was no longer taking pain medications. She stated that she was good and not taking pain medications any longer. I also told her that if she started to get pain, then she needed to stop and try again another day. Patient understood and had no further questions.

## 2018-05-19 NOTE — Telephone Encounter (Signed)
Please review. Thanks!  

## 2018-05-20 MED ORDER — LEVOTHYROXINE SODIUM 125 MCG PO TABS: 125.0000 ug | ORAL_TABLET | Freq: Every day | ORAL | 3 refills | Status: DC

## 2018-05-20 MED ORDER — FUROSEMIDE 20 MG PO TABS: 20.0000 mg | ORAL_TABLET | Freq: Every day | ORAL | 3 refills | Status: DC

## 2018-05-20 MED ORDER — PAROXETINE HCL 10 MG PO TABS: 10.0000 mg | ORAL_TABLET | Freq: Every day | ORAL | 3 refills | Status: DC

## 2018-05-20 MED ORDER — LISINOPRIL 20 MG PO TABS: 20.0000 mg | ORAL_TABLET | Freq: Every day | ORAL | 3 refills | Status: DC

## 2018-05-20 MED ORDER — LORAZEPAM 0.5 MG PO TABS: 0.5000 mg | ORAL_TABLET | Freq: Two times a day (BID) | ORAL | 3 refills | Status: DC | PRN

## 2018-05-20 MED ORDER — PRAVASTATIN SODIUM 10 MG PO TABS: 10.0000 mg | ORAL_TABLET | Freq: Every day | ORAL | 3 refills | Status: DC

## 2018-05-21 ENCOUNTER — Other Ambulatory Visit: Payer: Self-pay | Admitting: Family Medicine

## 2018-05-21 DIAGNOSIS — G629 Polyneuropathy, unspecified: Secondary | ICD-10-CM

## 2018-05-21 MED ORDER — GABAPENTIN 300 MG PO CAPS: ORAL_CAPSULE | ORAL | 3 refills | Status: DC

## 2018-05-21 MED ORDER — METFORMIN HCL 1000 MG PO TABS: 1000.0000 mg | ORAL_TABLET | Freq: Two times a day (BID) | ORAL | 3 refills | Status: DC

## 2018-05-21 NOTE — Telephone Encounter (Signed)
[facility] faxed a refill request for the following medications. Thanks CC  gabapentin (NEURONTIN) 300 MG capsule   metFORMIN (GLUCOPHAGE) 1000 MG tablet

## 2018-05-29 ENCOUNTER — Encounter: Payer: Self-pay | Admitting: Internal Medicine

## 2018-05-29 ENCOUNTER — Ambulatory Visit (INDEPENDENT_AMBULATORY_CARE_PROVIDER_SITE_OTHER): Payer: Medicare Other | Admitting: Internal Medicine

## 2018-05-29 VITALS — BP 130/68 | HR 89 | Ht 66.0 in | Wt 204.0 lb

## 2018-05-29 DIAGNOSIS — J449 Chronic obstructive pulmonary disease, unspecified: Secondary | ICD-10-CM

## 2018-05-29 DIAGNOSIS — J9611 Chronic respiratory failure with hypoxia: Secondary | ICD-10-CM

## 2018-05-29 DIAGNOSIS — I509 Heart failure, unspecified: Secondary | ICD-10-CM | POA: Diagnosis not present

## 2018-05-29 DIAGNOSIS — Z9981 Dependence on supplemental oxygen: Secondary | ICD-10-CM | POA: Diagnosis not present

## 2018-05-29 DIAGNOSIS — R0602 Shortness of breath: Secondary | ICD-10-CM | POA: Diagnosis not present

## 2018-05-29 NOTE — Progress Notes (Signed)
[facility] [place], [place] [address]  Pulmonary Sleep Medicine   Office Visit Note  Patient Name: Kristen Schroeder DOB: [birth year] MRN [id number]  Date of Service: 05/29/2018  Complaints/HPI:  Patient was in the hospital for apparently intestinal obstruction.  She had surgery done reduction of the hernia.  She was discharged home and came back into the emergency room and was observed main discharged back.  She states that she is doing better now.  She states she was not intubated had a tube down her no is to help decompress her.  As far as her breathing is concerned she continues to use oxygen she has little bit of cough noted.  She has no fevers no chills.  She did not have any pneumonitis.  ROS  General: (-) fever, (-) chills, (-) night sweats, (-) weakness Skin: (-) rashes, (-) itching,. Eyes: (-) visual changes, (-) redness, (-) itching. Nose and Sinuses: (-) nasal stuffiness or itchiness, (-) postnasal drip, (-) nosebleeds, (-) sinus trouble. Mouth and Throat: (-) sore throat, (-) hoarseness. Neck: (-) swollen glands, (-) enlarged thyroid, (-) neck pain. Respiratory: - cough, (-) bloody sputum, + shortness of breath, - wheezing. Cardiovascular: - ankle swelling, (-) chest pain. Lymphatic: (-) lymph node enlargement. Neurologic: (-) numbness, (-) tingling. Psychiatric: (-) anxiety, (-) depression   Current Medication: Outpatient Encounter Medications as of 05/29/2018  Medication Sig  . arformoterol (BROVANA) 15 MCG/2ML NEBU Take 2 mLs (15 mcg total) by nebulization 2 (two) times daily.  Marland Kitchen aspirin EC 81 MG tablet Take 81 mg by mouth daily.  . fluticasone (FLONASE) 50 MCG/ACT nasal spray Place 1 spray into both nostrils daily as needed.   . furosemide (LASIX) 20 MG tablet Take 1 tablet (20 mg total) by mouth daily.  Marland Kitchen gabapentin (NEURONTIN) 300 MG capsule 2 tablets in the morning, 1 tablet in the afternoon and 2 tablets in the evening  .  ipratropium-albuterol (DUONEB) 0.5-2.5 (3) MG/3ML SOLN Take 3 mLs by nebulization every 6 (six) hours as needed.  . isosorbide mononitrate (IMDUR) 30 MG 24 hr tablet Take 30 mg by mouth daily as needed (high bp).  Marland Kitchen levothyroxine (SYNTHROID, LEVOTHROID) 125 MCG tablet Take 1 tablet (125 mcg total) by mouth daily.  Marland Kitchen lisinopril (PRINIVIL,ZESTRIL) 20 MG tablet Take 1 tablet (20 mg total) by mouth daily.  Marland Kitchen loratadine (CLARITIN) 10 MG tablet TAKE 1 TABLET (10 MG TOTAL) BY MOUTH DAILY.  Marland Kitchen LORazepam (ATIVAN) 0.5 MG tablet Take 1 tablet (0.5 mg total) by mouth 2 (two) times daily as needed for anxiety.  . metFORMIN (GLUCOPHAGE) 1000 MG tablet Take 1 tablet (1,000 mg total) by mouth 2 (two) times daily.  . mometasone-formoterol (DULERA) 100-5 MCG/ACT AERO Inhale 2 puffs into the lungs 2 (two) times daily.  . MULTIPLE VITAMIN PO Take 1 tablet by mouth daily.   Marland Kitchen omeprazole (PRILOSEC) 40 MG capsule Take 1 capsule (40 mg total) by mouth daily.  . ondansetron (ZOFRAN-ODT) 4 MG disintegrating tablet TAKE 1 TABLET BY MOUTH EVERY 8 HOURS AS NEEDED FOR NAUSEA AND VOMITING  . ONE TOUCH ULTRA TEST test strip USE 1 STRIP 2 TIMES DAILY AND AS NEEDED  . OXYGEN Inhale 4 L into the lungs.  Marland Kitchen PARoxetine (PAXIL) 10 MG tablet Take 1 tablet (10 mg total) by mouth at bedtime.  . pravastatin (PRAVACHOL) 10 MG tablet Take 1 tablet (10 mg total) by mouth daily.  . salmeterol (SEREVENT) 50 MCG/DOSE diskus inhaler Inhale 1 puff into the  lungs daily.  . [DISCONTINUED] azithromycin (ZITHROMAX) 250 MG tablet Take 2 day 1 and then 1 daily (Patient not taking: Reported on 05/29/2018)  . [DISCONTINUED] DALIRESP 500 MCG TABS tablet Take 500 mcg by mouth daily.    No facility-administered encounter medications on file as of 05/29/2018.     Surgical History: Past Surgical History:  Procedure Laterality Date  . ABDOMINAL HYSTERECTOMY    . BOWEL RESECTION N/A 04/14/2018   Procedure: SMALL BOWEL RESECTION;  Surgeon: Jules Husbands, MD;   Location: [facility] ORS;  Service: General;  Laterality: N/A;  . CATARACT EXTRACTION W/PHACO Left 07/03/2016   Procedure: CATARACT EXTRACTION PHACO AND INTRAOCULAR LENS PLACEMENT ([place]);  Surgeon: Birder Robson, MD;  Location: [facility] ORS;  Service: Ophthalmology;  Laterality: Left;  Lot: [id number] H [place]: 00:40.1 AP%: 17.4 CDE:6.94  . HERNIA REPAIR    . TUBAL LIGATION    . VENTRAL HERNIA REPAIR N/A 04/14/2018   Procedure: HERNIA REPAIR VENTRAL ADULT;  Surgeon: Jules Husbands, MD;  Location: [facility] ORS;  Service: General;  Laterality: N/A;    Medical History: Past Medical History:  Diagnosis Date  . Arthritis   . COPD (chronic obstructive pulmonary disease) ([place])   . Diabetes mellitus without complication ([place])   . Dysrhythmia   . Heart murmur   . History of orthopnea   . Hypertension   . Hypothyroidism   . Neuropathy   . Oxygen dependent    3L  CONTINUOUS  . Pain CHRONIC BACK PAIN  . Shortness of breath dyspnea   . Wheezing     Family History: Family History  Problem Relation Age of Onset  . Heart disease Mother   . Drug abuse Other   . Hypertension Other     Social History: Social History   Socioeconomic History  . Marital status: Married    Spouse name: Not on file  . Number of children: 4  . Years of education: Not on file  . Highest education level: 8th grade  Occupational History  . Occupation: retired  Scientific laboratory technician  . Financial resource strain: Not hard at all  . Food insecurity:    Worry: Never true    Inability: Never true  . Transportation needs:    Medical: No    Non-medical: No  Tobacco Use  . Smoking status: Former Smoker    Packs/day: 0.50    Years: 15.00    Pack years: 7.50  . Smokeless tobacco: Never Used  . Tobacco comment: 30-40 years ago  Substance and Sexual Activity  . Alcohol use: No    Alcohol/week: 0.0 oz  . Drug use: No  . Sexual activity: Never  Lifestyle  . Physical activity:    Days per week: Not on file    Minutes per session:  Not on file  . Stress: Not at all  Relationships  . Social connections:    Talks on phone: Not on file    Gets together: Not on file    Attends religious service: Not on file    Active member of club or organization: Not on file    Attends meetings of clubs or organizations: Not on file    Relationship status: Not on file  . Intimate partner violence:    Fear of current or ex partner: No    Emotionally abused: No    Physically abused: No    Forced sexual activity: No  Other Topics Concern  . Not on file  Social History Narrative  . Not  on file    Vital Signs: Blood pressure 130/68, pulse 89, height '5\' 6"'$  (1.676 m), weight 204 lb (92.5 kg), SpO2 92 %.  Examination: General Appearance: The patient is well-developed, well-nourished, and in no distress. Skin: Gross inspection of skin unremarkable. Head: normocephalic, no gross deformities. Eyes: no gross deformities noted. ENT: ears appear grossly normal no exudates. Neck: Supple. No thyromegaly. No LAD. Respiratory: distant breath sounds noted. Cardiovascular: Normal S1 and S2 without murmur or rub. Extremities: No cyanosis. pulses are equal. Neurologic: Alert and oriented. No involuntary movements.  LABS: Recent Results (from the past 2160 hour(s))  POCT glycosylated hemoglobin (Hb A1C)     Status: None   Collection Time: 03/04/18 11:51 AM  Result Value Ref Range   Hemoglobin A1C 5.3   CBC with Differential/Platelet     Status: Abnormal   Collection Time: 03/04/18 12:35 PM  Result Value Ref Range   WBC 7.5 3.4 - 10.8 x10E3/uL   RBC 3.39 (L) 3.77 - 5.28 x10E6/uL   Hemoglobin 9.3 (L) 11.1 - 15.9 g/dL   Hematocrit 30.7 (L) 34.0 - 46.6 %   MCV 91 79 - 97 fL   MCH 27.4 26.6 - 33.0 pg   MCHC 30.3 (L) 31.5 - 35.7 g/dL   RDW 14.6 12.3 - 15.4 %   Platelets 242 150 - 379 x10E3/uL   Neutrophils 72 Not Estab. %   Lymphs 16 Not Estab. %   Monocytes 9 Not Estab. %   Eos 3 Not Estab. %   Basos 0 Not Estab. %   Neutrophils  Absolute 5.4 1.4 - 7.0 x10E3/uL   Lymphocytes Absolute 1.2 0.7 - 3.1 x10E3/uL   Monocytes Absolute 0.7 0.1 - 0.9 x10E3/uL   EOS (ABSOLUTE) 0.2 0.0 - 0.4 x10E3/uL   Basophils Absolute 0.0 0.0 - 0.2 x10E3/uL   Immature Granulocytes 0 Not Estab. %   Immature Grans (Abs) 0.0 0.0 - 0.1 x10E3/uL  Comprehensive metabolic panel     Status: Abnormal   Collection Time: 03/04/18 12:35 PM  Result Value Ref Range   Glucose 83 65 - 99 mg/dL   BUN 13 8 - 27 mg/dL   Creatinine, Ser 0.54 (L) 0.57 - 1.00 mg/dL   GFR calc non Af Amer 92 >59 mL/min/1.73   GFR calc Af Amer 106 >59 mL/min/1.73   BUN/Creatinine Ratio 24 12 - 28   Sodium 149 (H) 134 - 144 mmol/L   Potassium 4.0 3.5 - 5.2 mmol/L   Chloride 104 96 - 106 mmol/L   CO2 31 (H) 20 - 29 mmol/L   Calcium 9.1 8.7 - 10.3 mg/dL   Total Protein 5.9 (L) 6.0 - 8.5 g/dL   Albumin 3.7 3.5 - 4.8 g/dL   Globulin, Total 2.2 1.5 - 4.5 g/dL   Albumin/Globulin Ratio 1.7 1.2 - 2.2   Bilirubin Total <0.2 0.0 - 1.2 mg/dL   Alkaline Phosphatase 67 39 - 117 IU/L   AST 13 0 - 40 IU/L   ALT 6 0 - 32 IU/L  TSH     Status: None   Collection Time: 03/04/18 12:35 PM  Result Value Ref Range   TSH 0.877 0.450 - 4.500 uIU/mL  Lipase, blood     Status: None   Collection Time: 04/13/18  8:26 PM  Result Value Ref Range   Lipase 28 11 - 51 U/L    Comment: Performed at [facility], [address], [place], [place] [address]  Comprehensive metabolic panel     Status: Abnormal  Collection Time: 04/13/18  8:26 PM  Result Value Ref Range   Sodium 139 135 - 145 mmol/L   Potassium 4.4 3.5 - 5.1 mmol/L   Chloride 97 (L) 101 - 111 mmol/L   CO2 32 22 - 32 mmol/L   Glucose, Bld 116 (H) 65 - 99 mg/dL   BUN 11 6 - 20 mg/dL   Creatinine, Ser 0.76 0.44 - 1.00 mg/dL   Calcium 9.0 8.9 - 10.3 mg/dL   Total Protein 6.7 6.5 - 8.1 g/dL   Albumin 3.7 3.5 - 5.0 g/dL   AST 18 15 - 41 U/L   ALT 7 (L) 14 - 54 U/L   Alkaline Phosphatase 67 38 - 126 U/L   Total Bilirubin 0.4  0.3 - 1.2 mg/dL   GFR calc non Af Amer >60 >60 mL/min   GFR calc Af Amer >60 >60 mL/min    Comment: (NOTE) The eGFR has been calculated using the CKD EPI equation. This calculation has not been validated in all clinical situations. eGFR's persistently <60 mL/min signify possible Chronic Kidney Disease.    Anion gap 10 5 - 15    Comment: Performed at [facility], [address] [place], [place] [address]  CBC     Status: Abnormal   Collection Time: 04/13/18  8:26 PM  Result Value Ref Range   WBC 9.6 3.6 - 11.0 K/uL   RBC 3.78 (L) 3.80 - 5.20 MIL/uL   Hemoglobin 11.1 (L) 12.0 - 16.0 g/dL   HCT 33.8 (L) 35.0 - 47.0 %   MCV 89.3 80.0 - 100.0 fL   MCH 29.2 26.0 - 34.0 pg   MCHC 32.7 32.0 - 36.0 g/dL   RDW 15.4 (H) 11.5 - 14.5 %   Platelets 270 150 - 440 K/uL    Comment: Performed at [facility], [address] [place], [place] [address]  Surgical pathology     Status: None   Collection Time: 04/14/18  2:39 AM  Result Value Ref Range   SURGICAL PATHOLOGY      Surgical Pathology CASE: [id number] PATIENT: Cammy Hunsinger Surgical Pathology Report     SPECIMEN SUBMITTED: A. Small bowel, and omentum  CLINICAL HISTORY: None provided  PRE-OPERATIVE DIAGNOSIS: Incarcerated ventral hernia, partial bowel obstruction  POST-OPERATIVE DIAGNOSIS: Same as pre-op     DIAGNOSIS: A. SMALL BOWEL AND OMENTUM; RESECTION: - SMALL INTESTINE WITH CONGESTION AND FOCAL RUPTURED LYMPHANGIECTASIA, CONSISTENT WITH PROVIDED HISTORY OF VENTRAL HERNIA WITH PARTIAL BOWEL OBSTRUCTION. - UNREMARKABLE MARGINS OF RESECTION. - AREAS OF HYALINIZED FIBROSIS. - MATURE ADIPOSE TISSUE WITH CONGESTION. - NEGATIVE FOR MALIGNANCY.   GROSS DESCRIPTION: A. Labeled: Small bowel and omentum Received: In formalin Specimens received: Small bowel and omentum Measurements: 27.5 cm long up to 3.2 cm in diameter segment of bowel and a 10.3 x 9.5 x 3.5 cm aggregate of fibrous and fatty  tissue Specimen Integrity: Bowel intact Orientation: No orientation E xternal surface: external surface of the bowel is purple to tan with a central ecchymotic area as well as a 1.5 x 1.3 x 0.3 cm slightly raised nodule on the serosa which is 7.7 cm from closest mucosal margin, fragments of fibrofatty tissue has some dense firm fibrous areas Description: Slightly raised nodule on the small bowel marked green, opening the segment of bowel reveals the central ecchymotic area extends to the mucosa in an 8 cm long area, also in the area of previously described slightly raised nodule upon opening the mucosa is slightly raised and folded  in this area, there are three yellow submucosal nodules 0.6 x 0.4 x 0.3, 0.3 x 0.2 x 0.2 cm and 0.5 x 0.4 x 0.2 cm Lymph nodes: None identified  Block Summary: 1-2 - en face small bowel mucosal margins 3 - representative transition zone of ecchymosis in small bowel 4 - representative slightly raised serosal nodule in small bowel 5 - submucosal yellow areas in small bowel 6-7 - representative remaining fibrous  and fatty fragments of tissue   Final Diagnosis performed by Quay Burow, MD.   Electronically signed 04/17/2018 3:42:04PM The electronic signature indicates that the named Attending Pathologist has evaluated the specimen  Technical component performed at [facility], [address], [place], [place] [address] Lab: [phone] Dir: Rush Farmer, MD, MMM  Professional component performed at [facility], [facility], [address], [place], [place] [address] Lab: [phone] Dir: Dellia Nims. Rubinas, MD   Glucose, capillary     Status: Abnormal   Collection Time: 04/14/18  4:10 AM  Result Value Ref Range   Glucose-Capillary 139 (H) 65 - 99 mg/dL  CBC     Status: Abnormal   Collection Time: 04/14/18  6:18 AM  Result Value Ref Range   WBC 21.0 (H) 3.6 - 11.0 K/uL   RBC 3.41 (L) 3.80 - 5.20 MIL/uL   Hemoglobin 9.8 (L) 12.0 - 16.0 g/dL    HCT 30.5 (L) 35.0 - 47.0 %   MCV 89.3 80.0 - 100.0 fL   MCH 28.7 26.0 - 34.0 pg   MCHC 32.2 32.0 - 36.0 g/dL   RDW 15.7 (H) 11.5 - 14.5 %   Platelets 320 150 - 440 K/uL    Comment: Performed at [facility], [address] [place], [place] [address]  Comprehensive metabolic panel     Status: Abnormal   Collection Time: 04/14/18  6:18 AM  Result Value Ref Range   Sodium 138 135 - 145 mmol/L   Potassium 4.3 3.5 - 5.1 mmol/L   Chloride 101 101 - 111 mmol/L   CO2 30 22 - 32 mmol/L   Glucose, Bld 210 (H) 65 - 99 mg/dL   BUN 12 6 - 20 mg/dL   Creatinine, Ser 0.76 0.44 - 1.00 mg/dL   Calcium 8.2 (L) 8.9 - 10.3 mg/dL   Total Protein 6.2 (L) 6.5 - 8.1 g/dL   Albumin 3.3 (L) 3.5 - 5.0 g/dL   AST 18 15 - 41 U/L   ALT 7 (L) 14 - 54 U/L   Alkaline Phosphatase 62 38 - 126 U/L   Total Bilirubin 0.2 (L) 0.3 - 1.2 mg/dL   GFR calc non Af Amer >60 >60 mL/min   GFR calc Af Amer >60 >60 mL/min    Comment: (NOTE) The eGFR has been calculated using the CKD EPI equation. This calculation has not been validated in all clinical situations. eGFR's persistently <60 mL/min signify possible Chronic Kidney Disease.    Anion gap 7 5 - 15    Comment: Performed at [facility], [address] [place], [place] [address]  Glucose, capillary     Status: Abnormal   Collection Time: 04/14/18  7:34 AM  Result Value Ref Range   Glucose-Capillary 155 (H) 65 - 99 mg/dL   Comment 1 Notify RN   Glucose, capillary     Status: Abnormal   Collection Time: 04/14/18 11:44 AM  Result Value Ref Range   Glucose-Capillary 133 (H) 65 - 99 mg/dL   Comment 1 Notify RN   Glucose,  capillary     Status: Abnormal   Collection Time: 04/14/18  5:03 PM  Result Value Ref Range   Glucose-Capillary 116 (H) 65 - 99 mg/dL   Comment 1 Notify RN   Glucose, capillary     Status: Abnormal   Collection Time: 04/14/18  9:23 PM  Result Value Ref Range   Glucose-Capillary 60 (L) 65 - 99 mg/dL   Comment 1 Notify RN    Glucose, capillary     Status: None   Collection Time: 04/14/18 11:56 PM  Result Value Ref Range   Glucose-Capillary 87 65 - 99 mg/dL  Basic metabolic panel     Status: Abnormal   Collection Time: 04/15/18  5:13 AM  Result Value Ref Range   Sodium 135 135 - 145 mmol/L   Potassium 4.1 3.5 - 5.1 mmol/L   Chloride 99 (L) 101 - 111 mmol/L   CO2 28 22 - 32 mmol/L   Glucose, Bld 119 (H) 65 - 99 mg/dL   BUN 20 6 - 20 mg/dL   Creatinine, Ser 1.26 (H) 0.44 - 1.00 mg/dL   Calcium 8.0 (L) 8.9 - 10.3 mg/dL   GFR calc non Af Amer 40 (L) >60 mL/min   GFR calc Af Amer 47 (L) >60 mL/min    Comment: (NOTE) The eGFR has been calculated using the CKD EPI equation. This calculation has not been validated in all clinical situations. eGFR's persistently <60 mL/min signify possible Chronic Kidney Disease.    Anion gap 8 5 - 15    Comment: Performed at [facility], [address] [place], [place] [address]  CBC     Status: Abnormal   Collection Time: 04/15/18  5:13 AM  Result Value Ref Range   WBC 14.1 (H) 3.6 - 11.0 K/uL   RBC 2.44 (L) 3.80 - 5.20 MIL/uL   Hemoglobin 7.1 (L) 12.0 - 16.0 g/dL   HCT 21.9 (L) 35.0 - 47.0 %   MCV 89.4 80.0 - 100.0 fL   MCH 28.9 26.0 - 34.0 pg   MCHC 32.3 32.0 - 36.0 g/dL   RDW 15.6 (H) 11.5 - 14.5 %   Platelets 267 150 - 440 K/uL    Comment: Performed at [facility], [address] [place], [place] [address]  Glucose, capillary     Status: Abnormal   Collection Time: 04/15/18  7:41 AM  Result Value Ref Range   Glucose-Capillary 107 (H) 65 - 99 mg/dL  Glucose, capillary     Status: Abnormal   Collection Time: 04/15/18 11:55 AM  Result Value Ref Range   Glucose-Capillary 134 (H) 65 - 99 mg/dL   Comment 1 Notify RN   CBC     Status: Abnormal   Collection Time: 04/15/18  3:02 PM  Result Value Ref Range   WBC 9.7 3.6 - 11.0 K/uL   RBC 2.19 (L) 3.80 - 5.20 MIL/uL   Hemoglobin 6.3 (L) 12.0 - 16.0 g/dL   HCT 19.4 (L) 35.0 - 47.0 %   MCV  89.0 80.0 - 100.0 fL   MCH 29.0 26.0 - 34.0 pg   MCHC 32.6 32.0 - 36.0 g/dL   RDW 15.6 (H) 11.5 - 14.5 %   Platelets 213 150 - 440 K/uL    Comment: Performed at [facility], [address] [place], [place] [address]  ABO/Rh     Status: None   Collection Time: 04/15/18  3:02 PM  Result Value Ref Range   ABO/RH(D)  A POS Performed at [facility], [address] [place], [place] [address]   Prepare RBC     Status: None   Collection Time: 04/15/18  3:49 PM  Result Value Ref Range   Order Confirmation      ORDER PROCESSED BY BLOOD BANK Performed at [facility], [address] [place], [place] [address]   Type and screen [place]     Status: None   Collection Time: 04/15/18  3:49 PM  Result Value Ref Range   ABO/RH(D) A POS    Antibody Screen NEG    Sample Expiration 04/18/2018    Unit Number [id number]    Blood Component Type RED CELLS,LR    Unit division 00    Status of Unit ISSUED,FINAL    Transfusion Status OK TO TRANSFUSE    Crossmatch Result Compatible    Unit Number [id number]    Blood Component Type RED CELLS,LR    Unit division 00    Status of Unit ISSUED,FINAL    Transfusion Status OK TO TRANSFUSE    Crossmatch Result Compatible    Unit Number [id number]    Blood Component Type RED CELLS,LR    Unit division 00    Status of Unit REL FROM [facility]    Transfusion Status OK TO TRANSFUSE    Crossmatch Result      Compatible Performed at [facility], [address], [place], [place] [address]    Unit Number [id number]    Blood Component Type RED CELLS,LR    Unit division 00    Status of Unit REL FROM [facility]    Transfusion Status OK TO TRANSFUSE    Crossmatch Result Compatible   BPAM RBC     Status: None   Collection Time: 04/15/18  3:49 PM  Result Value Ref Range   ISSUE DATE / TIME [id number]    Blood Product Unit Number [id number]    PRODUCT CODE E0336V00    Unit Type and  Rh 6200    Blood Product Expiration Date 826415830940    ISSUE DATE / TIME 768088110315    Blood Product Unit Number [id number]    PRODUCT CODE E0336V00    Unit Type and Rh 6200    Blood Product Expiration Date 863817711657    Blood Product Unit Number [id number]    Unit Type and Rh 6200    Blood Product Expiration Date 166060045997    Blood Product Unit Number [id number]    Unit Type and Rh 6200    Blood Product Expiration Date 343568616837   Glucose, capillary     Status: None   Collection Time: 04/15/18  4:20 PM  Result Value Ref Range   Glucose-Capillary 83 65 - 99 mg/dL  Glucose, capillary     Status: Abnormal   Collection Time: 04/15/18  9:54 PM  Result Value Ref Range   Glucose-Capillary 102 (H) 65 - 99 mg/dL   Comment 1 Notify RN   CBC     Status: Abnormal   Collection Time: 04/16/18  3:12 AM  Result Value Ref Range   WBC 8.3 3.6 - 11.0 K/uL   RBC 2.99 (L) 3.80 - 5.20 MIL/uL   Hemoglobin 8.7 (L) 12.0 - 16.0 g/dL   HCT 26.7 (L) 35.0 - 47.0 %   MCV 89.2 80.0 - 100.0 fL   MCH 29.3 26.0 - 34.0 pg   MCHC 32.8 32.0 - 36.0 g/dL   RDW 15.8 (H) 11.5 - 14.5 %  Platelets 185 150 - 440 K/uL    Comment: Performed at [facility], [address] [place], [place] [address]  Protime-INR     Status: None   Collection Time: 04/16/18  3:12 AM  Result Value Ref Range   Prothrombin Time 13.9 11.4 - 15.2 seconds   INR 1.08     Comment: Performed at [facility], [address] [place], [place] [address]  APTT     Status: Abnormal   Collection Time: 04/16/18  3:12 AM  Result Value Ref Range   aPTT 37 (H) 24 - 36 seconds    Comment:        IF BASELINE aPTT IS ELEVATED, SUGGEST PATIENT RISK ASSESSMENT BE USED TO DETERMINE APPROPRIATE ANTICOAGULANT THERAPY. Performed at [facility], [address] [place], [place] [address]   Basic metabolic panel     Status: Abnormal   Collection Time: 04/16/18  3:12 AM  Result Value Ref Range   Sodium  141 135 - 145 mmol/L   Potassium 4.3 3.5 - 5.1 mmol/L   Chloride 106 101 - 111 mmol/L   CO2 31 22 - 32 mmol/L   Glucose, Bld 108 (H) 65 - 99 mg/dL   BUN 13 6 - 20 mg/dL   Creatinine, Ser 0.61 0.44 - 1.00 mg/dL   Calcium 8.3 (L) 8.9 - 10.3 mg/dL   GFR calc non Af Amer >60 >60 mL/min   GFR calc Af Amer >60 >60 mL/min    Comment: (NOTE) The eGFR has been calculated using the CKD EPI equation. This calculation has not been validated in all clinical situations. eGFR's persistently <60 mL/min signify possible Chronic Kidney Disease.    Anion gap 4 (L) 5 - 15    Comment: Performed at [facility], [address] [place], [place] [address]  Glucose, capillary     Status: None   Collection Time: 04/16/18  7:57 AM  Result Value Ref Range   Glucose-Capillary 94 65 - 99 mg/dL  Glucose, capillary     Status: None   Collection Time: 04/16/18 11:19 AM  Result Value Ref Range   Glucose-Capillary 99 65 - 99 mg/dL  Glucose, capillary     Status: Abnormal   Collection Time: 04/16/18  4:35 PM  Result Value Ref Range   Glucose-Capillary 103 (H) 65 - 99 mg/dL  Glucose, capillary     Status: Abnormal   Collection Time: 04/16/18  7:34 PM  Result Value Ref Range   Glucose-Capillary 110 (H) 65 - 99 mg/dL   Comment 1 Notify RN   Glucose, capillary     Status: None   Collection Time: 04/16/18  9:32 PM  Result Value Ref Range   Glucose-Capillary 96 65 - 99 mg/dL   Comment 1 Notify RN   CBC with Differential/Platelet     Status: Abnormal   Collection Time: 04/17/18  4:01 AM  Result Value Ref Range   WBC 8.8 3.6 - 11.0 K/uL   RBC 3.07 (L) 3.80 - 5.20 MIL/uL   Hemoglobin 9.0 (L) 12.0 - 16.0 g/dL   HCT 27.1 (L) 35.0 - 47.0 %   MCV 88.3 80.0 - 100.0 fL   MCH 29.2 26.0 - 34.0 pg   MCHC 33.1 32.0 - 36.0 g/dL   RDW 15.6 (H) 11.5 - 14.5 %   Platelets 186 150 - 440 K/uL   Neutrophils Relative % 79 %   Neutro Abs 7.0 (H) 1.4 - 6.5 K/uL   Lymphocytes Relative 9 %  Lymphs Abs 0.8 (L) 1.0 -  3.6 K/uL   Monocytes Relative 10 %   Monocytes Absolute 0.9 0.2 - 0.9 K/uL   Eosinophils Relative 2 %   Eosinophils Absolute 0.1 0 - 0.7 K/uL   Basophils Relative 0 %   Basophils Absolute 0.0 0 - 0.1 K/uL    Comment: Performed at [facility], [address] [place], [place] [address]  Basic metabolic panel     Status: Abnormal   Collection Time: 04/17/18  4:01 AM  Result Value Ref Range   Sodium 141 135 - 145 mmol/L   Potassium 4.3 3.5 - 5.1 mmol/L   Chloride 104 101 - 111 mmol/L   CO2 32 22 - 32 mmol/L   Glucose, Bld 96 65 - 99 mg/dL   BUN 7 6 - 20 mg/dL   Creatinine, Ser 0.40 (L) 0.44 - 1.00 mg/dL   Calcium 8.8 (L) 8.9 - 10.3 mg/dL   GFR calc non Af Amer >60 >60 mL/min   GFR calc Af Amer >60 >60 mL/min    Comment: (NOTE) The eGFR has been calculated using the CKD EPI equation. This calculation has not been validated in all clinical situations. eGFR's persistently <60 mL/min signify possible Chronic Kidney Disease.    Anion gap 5 5 - 15    Comment: Performed at [facility], [address] [place], [place] [address]  Glucose, capillary     Status: None   Collection Time: 04/17/18  7:35 AM  Result Value Ref Range   Glucose-Capillary 83 65 - 99 mg/dL  Glucose, capillary     Status: None   Collection Time: 04/17/18 11:34 AM  Result Value Ref Range   Glucose-Capillary 84 65 - 99 mg/dL  Glucose, capillary     Status: Abnormal   Collection Time: 04/17/18  4:31 PM  Result Value Ref Range   Glucose-Capillary 61 (L) 65 - 99 mg/dL   Comment 1 Notify RN   Glucose, capillary     Status: None   Collection Time: 04/17/18  4:52 PM  Result Value Ref Range   Glucose-Capillary 66 65 - 99 mg/dL   Comment 1 Notify RN   Glucose, capillary     Status: Abnormal   Collection Time: 04/17/18  5:25 PM  Result Value Ref Range   Glucose-Capillary 102 (H) 65 - 99 mg/dL   Comment 1 Notify RN   Glucose, capillary     Status: None   Collection Time: 04/17/18 10:39 PM   Result Value Ref Range   Glucose-Capillary 82 65 - 99 mg/dL   Comment 1 Notify RN   Glucose, capillary     Status: None   Collection Time: 04/18/18  8:04 AM  Result Value Ref Range   Glucose-Capillary 89 65 - 99 mg/dL   Comment 1 Notify RN   Glucose, capillary     Status: Abnormal   Collection Time: 04/18/18 11:34 AM  Result Value Ref Range   Glucose-Capillary 100 (H) 65 - 99 mg/dL   Comment 1 Notify RN   Glucose, capillary     Status: None   Collection Time: 04/18/18  5:06 PM  Result Value Ref Range   Glucose-Capillary 85 65 - 99 mg/dL   Comment 1 Notify RN   Glucose, capillary     Status: None   Collection Time: 04/18/18  9:28 PM  Result Value Ref Range   Glucose-Capillary 93 65 - 99 mg/dL  Glucose, capillary     Status: None  Collection Time: 04/19/18  7:31 AM  Result Value Ref Range   Glucose-Capillary 91 65 - 99 mg/dL  Glucose, capillary     Status: Abnormal   Collection Time: 04/19/18 11:30 AM  Result Value Ref Range   Glucose-Capillary 112 (H) 65 - 99 mg/dL  CBC with Differential     Status: Abnormal   Collection Time: 04/21/18  9:34 PM  Result Value Ref Range   WBC 13.9 (H) 3.6 - 11.0 K/uL   RBC 3.58 (L) 3.80 - 5.20 MIL/uL   Hemoglobin 10.4 (L) 12.0 - 16.0 g/dL   HCT 31.9 (L) 35.0 - 47.0 %   MCV 89.0 80.0 - 100.0 fL   MCH 29.0 26.0 - 34.0 pg   MCHC 32.5 32.0 - 36.0 g/dL   RDW 15.4 (H) 11.5 - 14.5 %   Platelets 288 150 - 440 K/uL   Neutrophils Relative % 87 %   Neutro Abs 12.0 (H) 1.4 - 6.5 K/uL   Lymphocytes Relative 4 %   Lymphs Abs 0.6 (L) 1.0 - 3.6 K/uL   Monocytes Relative 8 %   Monocytes Absolute 1.1 (H) 0.2 - 0.9 K/uL   Eosinophils Relative 1 %   Eosinophils Absolute 0.2 0 - 0.7 K/uL   Basophils Relative 0 %   Basophils Absolute 0.0 0 - 0.1 K/uL    Comment: Performed at [facility], [address] [place], [place] [address]  Comprehensive metabolic panel     Status: Abnormal   Collection Time: 04/21/18  9:34 PM  Result Value Ref  Range   Sodium 141 135 - 145 mmol/L   Potassium 3.8 3.5 - 5.1 mmol/L    Comment: HEMOLYSIS AT THIS LEVEL MAY AFFECT RESULT   Chloride 97 (L) 101 - 111 mmol/L   CO2 31 22 - 32 mmol/L   Glucose, Bld 113 (H) 65 - 99 mg/dL   BUN 9 6 - 20 mg/dL   Creatinine, Ser 0.46 0.44 - 1.00 mg/dL   Calcium 8.9 8.9 - 10.3 mg/dL   Total Protein 6.7 6.5 - 8.1 g/dL   Albumin 3.4 (L) 3.5 - 5.0 g/dL   AST 28 15 - 41 U/L   ALT 11 (L) 14 - 54 U/L   Alkaline Phosphatase 76 38 - 126 U/L   Total Bilirubin 1.0 0.3 - 1.2 mg/dL   GFR calc non Af Amer >60 >60 mL/min   GFR calc Af Amer >60 >60 mL/min    Comment: (NOTE) The eGFR has been calculated using the CKD EPI equation. This calculation has not been validated in all clinical situations. eGFR's persistently <60 mL/min signify possible Chronic Kidney Disease.    Anion gap 13 5 - 15    Comment: Performed at [facility], [address] [place], [place] [address]  Lipase, blood     Status: None   Collection Time: 04/21/18  9:34 PM  Result Value Ref Range   Lipase 21 11 - 51 U/L    Comment: Performed at [facility], [address] [place], [place] [address]  CBC     Status: Abnormal   Collection Time: 04/22/18  5:11 AM  Result Value Ref Range   WBC 11.3 (H) 3.6 - 11.0 K/uL   RBC 3.51 (L) 3.80 - 5.20 MIL/uL   Hemoglobin 10.2 (L) 12.0 - 16.0 g/dL   HCT 31.5 (L) 35.0 - 47.0 %   MCV 89.7 80.0 - 100.0 fL   MCH 28.9 26.0 - 34.0 pg   MCHC  32.3 32.0 - 36.0 g/dL   RDW 15.4 (H) 11.5 - 14.5 %   Platelets 275 150 - 440 K/uL    Comment: Performed at [facility], [address] [place], [place] [address]  Creatinine, serum     Status: Abnormal   Collection Time: 04/22/18  5:11 AM  Result Value Ref Range   Creatinine, Ser 0.34 (L) 0.44 - 1.00 mg/dL   GFR calc non Af Amer >60 >60 mL/min   GFR calc Af Amer >60 >60 mL/min    Comment: (NOTE) The eGFR has been calculated using the CKD EPI equation. This calculation has not been  validated in all clinical situations. eGFR's persistently <60 mL/min signify possible Chronic Kidney Disease. Performed at [facility], [address] [place], [place] [address]   Urinalysis, Complete w Microscopic     Status: Abnormal   Collection Time: 04/22/18  5:38 AM  Result Value Ref Range   Color, Urine YELLOW (A) YELLOW   APPearance CLEAR (A) CLEAR   Specific Gravity, Urine >1.046 (H) 1.005 - 1.030   pH 6.0 5.0 - 8.0   Glucose, UA NEGATIVE NEGATIVE mg/dL   Hgb urine dipstick NEGATIVE NEGATIVE   Bilirubin Urine NEGATIVE NEGATIVE   Ketones, ur 80 (A) NEGATIVE mg/dL   Protein, ur 30 (A) NEGATIVE mg/dL   Nitrite NEGATIVE NEGATIVE   Leukocytes, UA NEGATIVE NEGATIVE   RBC / HPF 0-5 0 - 5 RBC/hpf   WBC, UA 0-5 0 - 5 WBC/hpf   Bacteria, UA NONE SEEN NONE SEEN   Squamous Epithelial / LPF 0-5 0 - 5   Mucus PRESENT     Comment: Performed at [facility], [address] [place], [place] [address]  CBC     Status: Abnormal   Collection Time: 04/23/18  5:36 AM  Result Value Ref Range   WBC 9.1 3.6 - 11.0 K/uL   RBC 3.31 (L) 3.80 - 5.20 MIL/uL   Hemoglobin 9.6 (L) 12.0 - 16.0 g/dL   HCT 29.5 (L) 35.0 - 47.0 %   MCV 89.0 80.0 - 100.0 fL   MCH 28.9 26.0 - 34.0 pg   MCHC 32.5 32.0 - 36.0 g/dL   RDW 15.3 (H) 11.5 - 14.5 %   Platelets 270 150 - 440 K/uL    Comment: Performed at [facility], [address] [place], [place] [address]  Basic metabolic panel     Status: Abnormal   Collection Time: 04/23/18  5:36 AM  Result Value Ref Range   Sodium 142 135 - 145 mmol/L   Potassium 3.7 3.5 - 5.1 mmol/L   Chloride 102 101 - 111 mmol/L   CO2 34 (H) 22 - 32 mmol/L   Glucose, Bld 125 (H) 65 - 99 mg/dL   BUN 6 6 - 20 mg/dL   Creatinine, Ser <0.30 (L) 0.44 - 1.00 mg/dL   Calcium 8.3 (L) 8.9 - 10.3 mg/dL   GFR calc non Af Amer NOT CALCULATED >60 mL/min   GFR calc Af Amer NOT CALCULATED >60 mL/min    Comment: (NOTE) The eGFR has been calculated using the  CKD EPI equation. This calculation has not been validated in all clinical situations. eGFR's persistently <60 mL/min signify possible Chronic Kidney Disease.    Anion gap 6 5 - 15    Comment: Performed at [facility], [address] [place], [place] [address]  Basic metabolic panel     Status: Abnormal   Collection Time: 04/24/18  5:22  AM  Result Value Ref Range   Sodium 142 135 - 145 mmol/L   Potassium 3.8 3.5 - 5.1 mmol/L   Chloride 104 101 - 111 mmol/L   CO2 33 (H) 22 - 32 mmol/L   Glucose, Bld 113 (H) 65 - 99 mg/dL   BUN <5 (L) 6 - 20 mg/dL   Creatinine, Ser 0.42 (L) 0.44 - 1.00 mg/dL   Calcium 8.2 (L) 8.9 - 10.3 mg/dL   GFR calc non Af Amer >60 >60 mL/min   GFR calc Af Amer >60 >60 mL/min    Comment: (NOTE) The eGFR has been calculated using the CKD EPI equation. This calculation has not been validated in all clinical situations. eGFR's persistently <60 mL/min signify possible Chronic Kidney Disease.    Anion gap 5 5 - 15    Comment: Performed at [facility], [address] [place], [place] [address]  CBC     Status: Abnormal   Collection Time: 04/24/18  5:22 AM  Result Value Ref Range   WBC 8.4 3.6 - 11.0 K/uL   RBC 3.13 (L) 3.80 - 5.20 MIL/uL   Hemoglobin 9.2 (L) 12.0 - 16.0 g/dL   HCT 27.9 (L) 35.0 - 47.0 %   MCV 89.0 80.0 - 100.0 fL   MCH 29.5 26.0 - 34.0 pg   MCHC 33.2 32.0 - 36.0 g/dL   RDW 15.8 (H) 11.5 - 14.5 %   Platelets 258 150 - 440 K/uL    Comment: Performed at [facility], [address] [place], [place] [address]  Glucose, capillary     Status: Abnormal   Collection Time: 04/24/18 11:49 AM  Result Value Ref Range   Glucose-Capillary 105 (H) 65 - 99 mg/dL   Comment 1 Notify RN   Glucose, capillary     Status: None   Collection Time: 04/24/18  5:14 PM  Result Value Ref Range   Glucose-Capillary 76 65 - 99 mg/dL  Glucose, capillary     Status: None   Collection Time: 04/24/18  8:07 PM  Result Value Ref Range    Glucose-Capillary 93 65 - 99 mg/dL   Comment 1 Notify RN   Glucose, capillary     Status: Abnormal   Collection Time: 04/25/18 12:59 AM  Result Value Ref Range   Glucose-Capillary 110 (H) 65 - 99 mg/dL   Comment 1 Notify RN   Glucose, capillary     Status: Abnormal   Collection Time: 04/25/18  5:19 AM  Result Value Ref Range   Glucose-Capillary 132 (H) 65 - 99 mg/dL   Comment 1 Notify RN   Glucose, capillary     Status: Abnormal   Collection Time: 04/25/18  7:45 AM  Result Value Ref Range   Glucose-Capillary 108 (H) 65 - 99 mg/dL   Comment 1 Notify RN   CBC     Status: Abnormal   Collection Time: 04/25/18  7:59 AM  Result Value Ref Range   WBC 7.7 3.6 - 11.0 K/uL   RBC 2.96 (L) 3.80 - 5.20 MIL/uL   Hemoglobin 9.0 (L) 12.0 - 16.0 g/dL   HCT 26.3 (L) 35.0 - 47.0 %   MCV 88.7 80.0 - 100.0 fL   MCH 30.3 26.0 - 34.0 pg   MCHC 34.1 32.0 - 36.0 g/dL   RDW 15.3 (H) 11.5 - 14.5 %   Platelets 231 150 - 440 K/uL    Comment: Performed at [facility], [address] [place], [place] [address]  Magnesium     Status: None   Collection Time: 04/25/18  7:59 AM  Result Value Ref Range   Magnesium 1.7 1.7 - 2.4 mg/dL    Comment: Performed at [facility], [address] [place], [place] [address]  Comprehensive metabolic panel     Status: Abnormal   Collection Time: 04/25/18  7:59 AM  Result Value Ref Range   Sodium 141 135 - 145 mmol/L   Potassium 3.5 3.5 - 5.1 mmol/L   Chloride 100 (L) 101 - 111 mmol/L   CO2 35 (H) 22 - 32 mmol/L   Glucose, Bld 115 (H) 65 - 99 mg/dL   BUN 6 6 - 20 mg/dL   Creatinine, Ser 0.40 (L) 0.44 - 1.00 mg/dL   Calcium 8.1 (L) 8.9 - 10.3 mg/dL   Total Protein 5.6 (L) 6.5 - 8.1 g/dL   Albumin 2.7 (L) 3.5 - 5.0 g/dL   AST 14 (L) 15 - 41 U/L   ALT 7 (L) 14 - 54 U/L   Alkaline Phosphatase 55 38 - 126 U/L   Total Bilirubin 0.4 0.3 - 1.2 mg/dL   GFR calc non Af Amer >60 >60 mL/min   GFR calc Af Amer >60 >60 mL/min    Comment: (NOTE) The  eGFR has been calculated using the CKD EPI equation. This calculation has not been validated in all clinical situations. eGFR's persistently <60 mL/min signify possible Chronic Kidney Disease.    Anion gap 6 5 - 15    Comment: Performed at [facility], [address] [place], [place] [address]  Prealbumin     Status: Abnormal   Collection Time: 04/25/18  7:59 AM  Result Value Ref Range   Prealbumin 7.6 (L) 18 - 38 mg/dL    Comment: Performed at [facility] [address] [place], [place] [address]  Phosphorus     Status: None   Collection Time: 04/25/18  7:59 AM  Result Value Ref Range   Phosphorus 3.0 2.5 - 4.6 mg/dL    Comment: Performed at [facility], [address] [place], [place] [address]  Triglycerides     Status: None   Collection Time: 04/25/18  7:59 AM  Result Value Ref Range   Triglycerides 147 <150 mg/dL    Comment: Performed at [facility], [address] [place], [place] [address]  Differential     Status: Abnormal   Collection Time: 04/25/18  7:59 AM  Result Value Ref Range   Neutrophils Relative % 77 %   Neutro Abs 5.9 1.4 - 6.5 K/uL   Lymphocytes Relative 10 %   Lymphs Abs 0.7 (L) 1.0 - 3.6 K/uL   Monocytes Relative 11 %   Monocytes Absolute 0.8 0.2 - 0.9 K/uL   Eosinophils Relative 2 %   Eosinophils Absolute 0.1 0 - 0.7 K/uL   Basophils Relative 0 %   Basophils Absolute 0.0 0 - 0.1 K/uL    Comment: Performed at [facility], [address] [place], [place] [address]  Glucose, capillary     Status: None   Collection Time: 04/25/18 11:44 AM  Result Value Ref Range   Glucose-Capillary 91 65 - 99 mg/dL   Comment 1 Notify RN   Glucose, capillary     Status: None   Collection Time: 04/25/18  5:17 PM  Result Value Ref Range   Glucose-Capillary 99 65 - 99 mg/dL   Comment 1 Notify RN   Glucose, capillary  Status: Abnormal   Collection Time: 04/26/18 12:03 AM  Result Value Ref Range    Glucose-Capillary 139 (H) 65 - 99 mg/dL  Basic metabolic panel     Status: Abnormal   Collection Time: 04/26/18  5:24 AM  Result Value Ref Range   Sodium 142 135 - 145 mmol/L   Potassium 3.5 3.5 - 5.1 mmol/L   Chloride 101 101 - 111 mmol/L   CO2 35 (H) 22 - 32 mmol/L   Glucose, Bld 117 (H) 65 - 99 mg/dL   BUN 9 6 - 20 mg/dL   Creatinine, Ser 0.38 (L) 0.44 - 1.00 mg/dL   Calcium 8.2 (L) 8.9 - 10.3 mg/dL   GFR calc non Af Amer >60 >60 mL/min   GFR calc Af Amer >60 >60 mL/min    Comment: (NOTE) The eGFR has been calculated using the CKD EPI equation. This calculation has not been validated in all clinical situations. eGFR's persistently <60 mL/min signify possible Chronic Kidney Disease.    Anion gap 6 5 - 15    Comment: Performed at [facility], [address] [place], [place] [address]  Magnesium     Status: Abnormal   Collection Time: 04/26/18  5:24 AM  Result Value Ref Range   Magnesium 1.6 (L) 1.7 - 2.4 mg/dL    Comment: Performed at [facility], [address] [place], [place] [address]  Phosphorus     Status: None   Collection Time: 04/26/18  5:24 AM  Result Value Ref Range   Phosphorus 3.4 2.5 - 4.6 mg/dL    Comment: Performed at [facility], [address] [place], [place] [address]  Albumin     Status: Abnormal   Collection Time: 04/26/18  5:24 AM  Result Value Ref Range   Albumin 2.6 (L) 3.5 - 5.0 g/dL    Comment: Performed at [facility], [address] [place], [place] [address]  Glucose, capillary     Status: Abnormal   Collection Time: 04/26/18  6:15 AM  Result Value Ref Range   Glucose-Capillary 103 (H) 65 - 99 mg/dL  Glucose, capillary     Status: Abnormal   Collection Time: 04/26/18 11:50 AM  Result Value Ref Range   Glucose-Capillary 125 (H) 65 - 99 mg/dL  Glucose, capillary     Status: Abnormal   Collection Time: 04/26/18  4:21 PM  Result Value Ref Range   Glucose-Capillary 101 (H) 65 - 99 mg/dL   Glucose, capillary     Status: Abnormal   Collection Time: 04/27/18  1:01 AM  Result Value Ref Range   Glucose-Capillary 131 (H) 65 - 99 mg/dL  Glucose, capillary     Status: Abnormal   Collection Time: 04/27/18  6:13 AM  Result Value Ref Range   Glucose-Capillary 126 (H) 65 - 99 mg/dL  Basic metabolic panel     Status: Abnormal   Collection Time: 04/27/18  6:52 AM  Result Value Ref Range   Sodium 141 135 - 145 mmol/L   Potassium 4.0 3.5 - 5.1 mmol/L   Chloride 100 (L) 101 - 111 mmol/L   CO2 33 (H) 22 - 32 mmol/L   Glucose, Bld 117 (H) 65 - 99 mg/dL   BUN 12 6 - 20 mg/dL   Creatinine, Ser 0.53 0.44 - 1.00 mg/dL   Calcium 8.3 (L) 8.9 - 10.3 mg/dL   GFR calc non Af Amer >60 >60 mL/min   GFR calc Af Amer >60 >60 mL/min  Comment: (NOTE) The eGFR has been calculated using the CKD EPI equation. This calculation has not been validated in all clinical situations. eGFR's persistently <60 mL/min signify possible Chronic Kidney Disease.    Anion gap 8 5 - 15    Comment: Performed at [facility], [address] [place], [place] [address]  Magnesium     Status: None   Collection Time: 04/27/18  6:52 AM  Result Value Ref Range   Magnesium 2.0 1.7 - 2.4 mg/dL    Comment: Performed at [facility], [address] [place], [place] [address]  Phosphorus     Status: None   Collection Time: 04/27/18  6:52 AM  Result Value Ref Range   Phosphorus 3.5 2.5 - 4.6 mg/dL    Comment: Performed at [facility], [address] [place], [place] [address]  Albumin     Status: Abnormal   Collection Time: 04/27/18  6:52 AM  Result Value Ref Range   Albumin 2.8 (L) 3.5 - 5.0 g/dL    Comment: Performed at [facility], [address] [place], [place] [address]  Glucose, capillary     Status: Abnormal   Collection Time: 04/27/18 12:00 PM  Result Value Ref Range   Glucose-Capillary 126 (H) 65 - 99 mg/dL  Glucose, capillary     Status: Abnormal   Collection  Time: 04/27/18  5:40 PM  Result Value Ref Range   Glucose-Capillary 124 (H) 65 - 99 mg/dL  Glucose, capillary     Status: Abnormal   Collection Time: 04/27/18  8:16 PM  Result Value Ref Range   Glucose-Capillary 113 (H) 65 - 99 mg/dL   Comment 1 Notify RN   Glucose, capillary     Status: Abnormal   Collection Time: 04/28/18 12:05 AM  Result Value Ref Range   Glucose-Capillary 115 (H) 65 - 99 mg/dL   Comment 1 Notify RN   Glucose, capillary     Status: Abnormal   Collection Time: 04/28/18  5:55 AM  Result Value Ref Range   Glucose-Capillary 116 (H) 65 - 99 mg/dL  Triglycerides     Status: Abnormal   Collection Time: 04/28/18  6:01 AM  Result Value Ref Range   Triglycerides 224 (H) <150 mg/dL    Comment: Performed at [facility], [address] [place], [place] [address]  Prealbumin     Status: Abnormal   Collection Time: 04/28/18  6:01 AM  Result Value Ref Range   Prealbumin 11.1 (L) 18 - 38 mg/dL    Comment: Performed at [facility], [address] [place], [place] [address]  Comprehensive metabolic panel     Status: Abnormal   Collection Time: 04/28/18  6:02 AM  Result Value Ref Range   Sodium 140 135 - 145 mmol/L   Potassium 4.1 3.5 - 5.1 mmol/L   Chloride 101 101 - 111 mmol/L   CO2 33 (H) 22 - 32 mmol/L   Glucose, Bld 120 (H) 65 - 99 mg/dL   BUN 10 6 - 20 mg/dL   Creatinine, Ser 0.41 (L) 0.44 - 1.00 mg/dL   Calcium 8.4 (L) 8.9 - 10.3 mg/dL   Total Protein 5.7 (L) 6.5 - 8.1 g/dL   Albumin 2.7 (L) 3.5 - 5.0 g/dL   AST 20 15 - 41 U/L   ALT 7 (L) 14 - 54 U/L   Alkaline Phosphatase 54 38 - 126 U/L   Total Bilirubin 0.2 (L) 0.3 - 1.2 mg/dL  GFR calc non Af Amer >60 >60 mL/min   GFR calc Af Amer >60 >60 mL/min    Comment: (NOTE) The eGFR has been calculated using the CKD EPI equation. This calculation has not been validated in all clinical situations. eGFR's persistently <60 mL/min signify possible Chronic Kidney Disease.    Anion gap 6 5 - 15     Comment: Performed at [facility], [address] [place], [place] [address]  Magnesium     Status: None   Collection Time: 04/28/18  6:02 AM  Result Value Ref Range   Magnesium 1.9 1.7 - 2.4 mg/dL    Comment: Performed at [facility], [address] [place], [place] [address]  Phosphorus     Status: None   Collection Time: 04/28/18  6:02 AM  Result Value Ref Range   Phosphorus 3.4 2.5 - 4.6 mg/dL    Comment: Performed at [facility], [address] [place], [place] [address]  CBC     Status: Abnormal   Collection Time: 04/28/18  6:02 AM  Result Value Ref Range   WBC 8.5 3.6 - 11.0 K/uL   RBC 3.11 (L) 3.80 - 5.20 MIL/uL   Hemoglobin 9.0 (L) 12.0 - 16.0 g/dL   HCT 27.7 (L) 35.0 - 47.0 %   MCV 89.1 80.0 - 100.0 fL   MCH 28.8 26.0 - 34.0 pg   MCHC 32.3 32.0 - 36.0 g/dL   RDW 15.5 (H) 11.5 - 14.5 %   Platelets 241 150 - 440 K/uL    Comment: Performed at [facility], [address] [place], [place] [address]  Differential     Status: Abnormal   Collection Time: 04/28/18  6:02 AM  Result Value Ref Range   Neutrophils Relative % 76 %   Neutro Abs 6.6 (H) 1.4 - 6.5 K/uL   Lymphocytes Relative 9 %   Lymphs Abs 0.7 (L) 1.0 - 3.6 K/uL   Monocytes Relative 12 %   Monocytes Absolute 1.0 (H) 0.2 - 0.9 K/uL   Eosinophils Relative 3 %   Eosinophils Absolute 0.2 0 - 0.7 K/uL   Basophils Relative 0 %   Basophils Absolute 0.0 0 - 0.1 K/uL    Comment: Performed at [facility], [address] [place], [place] [address]  Glucose, capillary     Status: Abnormal   Collection Time: 04/28/18 11:36 AM  Result Value Ref Range   Glucose-Capillary 138 (H) 65 - 99 mg/dL   Comment 1 Notify RN   Glucose, capillary     Status: Abnormal   Collection Time: 04/28/18  4:28 PM  Result Value Ref Range   Glucose-Capillary 125 (H) 65 - 99 mg/dL   Comment 1 Notify RN   Glucose, capillary     Status: None   Collection Time: 04/28/18 11:33 PM  Result  Value Ref Range   Glucose-Capillary 96 65 - 99 mg/dL  Glucose, capillary     Status: Abnormal   Collection Time: 04/29/18  6:21 AM  Result Value Ref Range   Glucose-Capillary 106 (H) 70 - 99 mg/dL   Comment 1 Notify RN   Glucose, capillary     Status: None   Collection Time: 04/29/18 11:52 AM  Result Value Ref Range   Glucose-Capillary 98 70 - 99 mg/dL   Comment 1 Notify RN     Radiology: Ct Abdomen Pelvis W Contrast  Result Date: 04/21/2018 CLINICAL DATA:  77 year old female with lower abdominal pain and nausea following  recent surgery on 04/14/2018 for incarcerated ventral small-bowel hernia. EXAM: CT ABDOMEN AND PELVIS WITH CONTRAST TECHNIQUE: Multidetector CT imaging of the abdomen and pelvis was performed using the standard protocol following bolus administration of intravenous contrast. CONTRAST:  180m ISOVUE-300 IOPAMIDOL (ISOVUE-300) INJECTION 61% COMPARISON:  Preoperative CT Abdomen and Pelvis 04/13/2018. FINDINGS: Lower chest: Stable 6-7 mm left lower lobe subpleural nodule since 06/14/2017, likely benign. Bilateral lower lobe bronchiectasis and mild scarring. No acute pulmonary opacity, pleural or pericardial effusion. Hepatobiliary: Trace perihepatic free fluid. Otherwise negative liver. 20 mm gallstone redemonstrated in the gallbladder neck. No pericholecystic inflammation. Pancreas: Negative. Spleen: Negative. Adrenals/Urinary Tract: Stable and negative adrenal glands. Bilateral renal enhancement and contrast excretion is stable and within normal limits. Normal course of the ureters to the urinary bladder. Diminutive and unremarkable urinary bladder. Stomach/Bowel: Negative rectum aside from mild retained stool. Decompressed sigmoid colon. Proximal sigmoid diverticulosis. Intermediate density free fluid abutting the sigmoid colon and tracking into the pelvis. Diverticulosis continues into the left colon which is decompressed. The transverse colon is redundant but largely decompressed.  Mildly redundant ascending colon is otherwise negative. The terminal ileum is decompressed and negative. There are decompressed small bowel loops throughout the right lower abdomen. The stomach and duodenum are moderately to severely distended with fluid. Similar distension of the jejunum with fluid-filled loops up to 5 centimeters diameter. There is an abrupt transition point adjacent to what appears to be a side to side small bowel anastomosis in the left lower quadrant. See series 2, images 65 through 70 and sagittal image 106. There is no extraluminal gas in the abdomen. The dominant fat containing left paracentral small bowel hernia was repaired since the prior CT. A smaller mesenteric fat containing right paracentral hernia is stable (series 2, image 72 and sagittal images 87 through 77). Widespread ventral abdominal wall soft tissue thickening and stranding appears to be postoperative in nature. No organized abdominal wall fluid collection. Skin staples remain in place. Vascular/Lymphatic: Calcified aortic atherosclerosis. The major arterial structures in the abdomen and pelvis are patent. Reproductive: Surgically absent. Other: Intermediate density free fluid in the pelvis. Musculoskeletal: Stable visualized osseous structures. IMPRESSION: 1. High-grade Small Bowel Obstruction with transition point at the postoperative small bowel anastomosis in the left lower quadrant. Subsequent moderate to severe dilatation of upstream jejunum, the stomach and duodenum. 2. No pneumoperitoneum to suggest perforation. A small volume lower abdominal and pelvic hemoperitoneum is likely postoperative in nature. A trace amount of upper abdominal free fluid may be reactive due to #1. 3. Extensive ventral abdominal wall inflammation also appears to be postoperative. No organized fluid collection. 4. Interval repair of the dominant fat and bowel containing ventral abdominal hernia. Stable smaller right paracentral fat containing  hernia (series 2, image 72). 5. Cholelithiasis. Aortic Atherosclerosis (ICD10-I70.0). Lower lobe bronchiectasis. Electronically Signed   By: HGenevie AnnM.D.   On: 04/21/2018 22:48   Dg Abd Portable 1 View  Result Date: 04/22/2018 CLINICAL DATA:  NG tube placement. EXAM: PORTABLE ABDOMEN - 1 VIEW COMPARISON:  None. FINDINGS: Targeted study of the upper abdomen for gastric tube placement. The tip of a gastric tube extends below the left hemidiaphragm into the expected location of the body of the stomach. The side port however is 3 cm above the GE junction and further advancement of the tube is recommended by at least 6 cm the included heart is top normal. Streaky bibasilar atelectasis is identified. There is no pneumoperitoneum. No radio-opaque calculi or other significant radiographic abnormality  are seen. IMPRESSION: Further advancement of gastric tube by at least 6 cm is recommended. The side-port is noted above the GE junction by approximately 3 cm. Electronically Signed   By: Ashley Royalty M.D.   On: 04/22/2018 00:49    No results found.  No results found.    Assessment and Plan: Patient Active Problem List   Diagnosis Date Noted  . SBO (small bowel obstruction) ([place]) 04/22/2018  . Incarcerated hernia 04/14/2018  . Incarcerated hernia of abdominal cavity   . Aneurysm ([place]) 07/10/2016  . Nonspecific abnormal finding 05/11/2015  . Allergic rhinitis 05/11/2015  . Anxiety 05/11/2015  . Bronchitis, chronic ([place]) 05/11/2015  . CCF (congestive cardiac failure) ([place]) 05/11/2015  . Clinical depression 05/11/2015  . 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 Essential (primary) hypertension 05/11/2015  . Acid reflux 05/11/2015  . HLD (hyperlipidemia) 05/11/2015  . Adult hypothyroidism 05/11/2015  . Cervical dysplasia, mild 05/11/2015  . Neuropathy 05/11/2015  . Adiposity 05/11/2015  . Obstructive apnea 05/11/2015  . Arthritis, degenerative 05/11/2015    1. COPD  She is back  to her baseline.  She is out of daily rest.  I will look for samples if we have samples to give her because the medication is quite expensive and she is not able to afford. 2. CHF  Currently is compensated will continue with present management. 3. Oxygen dependent  Oxygen therapy will continued 4. Chronic respiratory failure with hypoxia compensated will continue with present management continue with inhaler regimen as prescribed.  General Counseling: I have discussed the findings of the evaluation and examination with Anyjah.  I have also discussed any further diagnostic evaluation thatmay be needed or ordered today. Adalia verbalizes understanding of the findings of todays visit. We also reviewed her medications today and discussed drug interactions and side effects including but not limited excessive drowsiness and altered mental states. We also discussed that there is always a risk not just to her but also people around her. she has been encouraged to call the office with any questions or concerns that should arise related to todays visit.    Time spent: 7mn  I have personally obtained a history, examined the patient, evaluated laboratory and imaging results, formulated the assessment and plan and placed orders.    SAllyne Gee MD FPhysicians [facility] LLCPulmonary [facility] Critical Care Sleep medicine

## 2018-05-29 NOTE — Patient Instructions (Signed)

## 2018-06-04 ENCOUNTER — Other Ambulatory Visit: Payer: Self-pay | Admitting: Family Medicine

## 2018-06-12 ENCOUNTER — Telehealth: Payer: Self-pay

## 2018-06-12 NOTE — Telephone Encounter (Signed)
Pt advised samples for dulera is ready for pickup

## 2018-06-13 DIAGNOSIS — E119 Type 2 diabetes mellitus without complications: Secondary | ICD-10-CM | POA: Diagnosis not present

## 2018-06-13 LAB — HM DIABETES EYE EXAM

## 2018-06-14 DIAGNOSIS — J449 Chronic obstructive pulmonary disease, unspecified: Secondary | ICD-10-CM | POA: Diagnosis not present

## 2018-06-24 ENCOUNTER — Other Ambulatory Visit: Payer: Self-pay | Admitting: Neurosurgery

## 2018-06-24 DIAGNOSIS — I671 Cerebral aneurysm, nonruptured: Secondary | ICD-10-CM

## 2018-06-25 ENCOUNTER — Telehealth: Payer: Self-pay | Admitting: Family Medicine

## 2018-06-25 NOTE — Telephone Encounter (Signed)
Okay to order?  Thanks,   -Laura  

## 2018-06-25 NOTE — Telephone Encounter (Signed)
Riza with [facility] called for the patient  wanting to order a new Blood Glucose Meter One Touch Accu Check.    [facility]   Thanks teri

## 2018-06-26 NOTE — Telephone Encounter (Signed)
Yes  thx

## 2018-06-27 MED ORDER — BLOOD GLUCOSE MONITOR KIT: PACK | 0 refills | Status: DC

## 2018-06-27 NOTE — Telephone Encounter (Signed)
Glucose order was sent.

## 2018-07-01 ENCOUNTER — Telehealth: Payer: Self-pay | Admitting: Surgery

## 2018-07-01 NOTE — Telephone Encounter (Signed)
Per Dr.Davis patient can resume eating  Raw vegetables today. Left message on VM.

## 2018-07-01 NOTE — Telephone Encounter (Signed)
Patient has called and has questions about her diet. She stated that at her last office visit, Dr Everlene FarrierPabon advised her not to eat raw fruits and veggies. She would like to know if this is only for a certain amount of time.   Patient had a Ventral hernia repair,partial omentectomy and small bowel resection on 04/14/18 with Dr Everlene FarrierPabon.

## 2018-07-02 ENCOUNTER — Other Ambulatory Visit: Payer: Self-pay | Admitting: Internal Medicine

## 2018-07-04 ENCOUNTER — Ambulatory Visit
Admission: RE | Admit: 2018-07-04 | Discharge: 2018-07-04 | Disposition: A | Discharge status: Home / Self Care | Payer: Medicare Other | Source: Ambulatory Visit | Attending: Neurosurgery | Admitting: Neurosurgery

## 2018-07-04 DIAGNOSIS — I671 Cerebral aneurysm, nonruptured: Secondary | ICD-10-CM | POA: Diagnosis not present

## 2018-07-04 MED ORDER — IOPAMIDOL (ISOVUE-370) INJECTION 76%
75.0000 mL | Freq: Once | INTRAVENOUS | Status: AC | PRN
  Administered 2018-07-04: 75 mL via INTRAVENOUS

## 2018-07-08 ENCOUNTER — Other Ambulatory Visit: Payer: Self-pay | Admitting: Family Medicine

## 2018-07-08 DIAGNOSIS — E119 Type 2 diabetes mellitus without complications: Secondary | ICD-10-CM

## 2018-07-08 MED ORDER — ONETOUCH ULTRA 2 W/DEVICE KIT: 1.0000 | PACK | Freq: Every day | 0 refills | Status: DC

## 2018-07-08 NOTE — Telephone Encounter (Signed)
Done  ED 

## 2018-07-08 NOTE — Telephone Encounter (Signed)
Pt needs a new glucose meter kit.  Hers broke.  She wants the One Touch   [facility]  Pt's CB# [phone]  Thanks Con Memos

## 2018-07-10 ENCOUNTER — Telehealth: Payer: Self-pay

## 2018-07-10 MED ORDER — GLUCOSE BLOOD VI STRP: ORAL_STRIP | 12 refills | Status: DC

## 2018-07-10 NOTE — Telephone Encounter (Signed)
Patient is requesting a prescription to be send to her pharmacy [facility] test strips[facility] [place]

## 2018-07-15 DIAGNOSIS — I671 Cerebral aneurysm, nonruptured: Secondary | ICD-10-CM | POA: Diagnosis not present

## 2018-07-15 DIAGNOSIS — J449 Chronic obstructive pulmonary disease, unspecified: Secondary | ICD-10-CM | POA: Diagnosis not present

## 2018-08-04 DIAGNOSIS — J8410 Pulmonary fibrosis, unspecified: Secondary | ICD-10-CM | POA: Diagnosis not present

## 2018-08-04 DIAGNOSIS — R0602 Shortness of breath: Secondary | ICD-10-CM | POA: Diagnosis not present

## 2018-08-04 DIAGNOSIS — E7849 Other hyperlipidemia: Secondary | ICD-10-CM | POA: Diagnosis not present

## 2018-08-04 DIAGNOSIS — R0902 Hypoxemia: Secondary | ICD-10-CM | POA: Diagnosis not present

## 2018-08-04 DIAGNOSIS — K450 Other specified abdominal hernia with obstruction, without gangrene: Secondary | ICD-10-CM | POA: Insufficient documentation

## 2018-08-04 DIAGNOSIS — I10 Essential (primary) hypertension: Secondary | ICD-10-CM | POA: Diagnosis not present

## 2018-08-05 ENCOUNTER — Other Ambulatory Visit: Payer: Self-pay | Admitting: Family Medicine

## 2018-08-05 ENCOUNTER — Telehealth: Payer: Self-pay | Admitting: Family Medicine

## 2018-08-05 NOTE — Telephone Encounter (Signed)
Pt called asking for a refill on mometasone-formoterol (DULERA) 100-5 MCG/ACT AERO. I advised that Dr. Welton Flakes manages that medication for pt and pt needs to contact that office or her pharmacy to request refill. Pt stated that Dr. Sullivan Lone has also prescribed that medication for her in the past. I don't see that Dr. Sullivan Lone has sent that in the past. Pt then requested to speak with Michelle Nasuti and request that [facility] return her call today if possible. Please advise. Thanks TNP

## 2018-08-05 NOTE — Telephone Encounter (Signed)
Patient requested samples due to cost.  We have no samples of the [facility], per Dr Sullivan Lone have given her Symbicort.

## 2018-08-14 DIAGNOSIS — J449 Chronic obstructive pulmonary disease, unspecified: Secondary | ICD-10-CM | POA: Diagnosis not present

## 2018-08-27 ENCOUNTER — Other Ambulatory Visit: Payer: Self-pay

## 2018-08-27 DIAGNOSIS — E119 Type 2 diabetes mellitus without complications: Secondary | ICD-10-CM

## 2018-08-27 MED ORDER — ONETOUCH ULTRA 2 W/DEVICE KIT: 1.0000 | PACK | Freq: Every day | 0 refills | Status: DC

## 2018-09-01 ENCOUNTER — Inpatient Hospital Stay
Admission: EM | Admit: 2018-09-01 | Discharge: 2018-09-11 | DRG: 337 | Disposition: A | Discharge status: Home / Self Care | Payer: Medicare Other | Attending: Surgery | Admitting: Surgery

## 2018-09-01 ENCOUNTER — Emergency Department: Payer: Medicare Other

## 2018-09-01 ENCOUNTER — Inpatient Hospital Stay: Payer: Medicare Other

## 2018-09-01 ENCOUNTER — Other Ambulatory Visit: Payer: Self-pay

## 2018-09-01 DIAGNOSIS — Z7984 Long term (current) use of oral hypoglycemic drugs: Secondary | ICD-10-CM | POA: Diagnosis not present

## 2018-09-01 DIAGNOSIS — Z9981 Dependence on supplemental oxygen: Secondary | ICD-10-CM | POA: Diagnosis not present

## 2018-09-01 DIAGNOSIS — Z885 Allergy status to narcotic agent status: Secondary | ICD-10-CM | POA: Diagnosis not present

## 2018-09-01 DIAGNOSIS — R9431 Abnormal electrocardiogram [ECG] [EKG]: Secondary | ICD-10-CM | POA: Diagnosis not present

## 2018-09-01 DIAGNOSIS — Z9842 Cataract extraction status, left eye: Secondary | ICD-10-CM | POA: Diagnosis not present

## 2018-09-01 DIAGNOSIS — Z79899 Other long term (current) drug therapy: Secondary | ICD-10-CM

## 2018-09-01 DIAGNOSIS — E1140 Type 2 diabetes mellitus with diabetic neuropathy, unspecified: Secondary | ICD-10-CM | POA: Diagnosis not present

## 2018-09-01 DIAGNOSIS — I10 Essential (primary) hypertension: Secondary | ICD-10-CM | POA: Diagnosis present

## 2018-09-01 DIAGNOSIS — Z978 Presence of other specified devices: Secondary | ICD-10-CM

## 2018-09-01 DIAGNOSIS — K429 Umbilical hernia without obstruction or gangrene: Secondary | ICD-10-CM | POA: Diagnosis present

## 2018-09-01 DIAGNOSIS — Z87891 Personal history of nicotine dependence: Secondary | ICD-10-CM | POA: Diagnosis not present

## 2018-09-01 DIAGNOSIS — K5651 Intestinal adhesions [bands], with partial obstruction: Secondary | ICD-10-CM | POA: Diagnosis not present

## 2018-09-01 DIAGNOSIS — Z79890 Hormone replacement therapy (postmenopausal): Secondary | ICD-10-CM

## 2018-09-01 DIAGNOSIS — Z961 Presence of intraocular lens: Secondary | ICD-10-CM | POA: Diagnosis present

## 2018-09-01 DIAGNOSIS — K56609 Unspecified intestinal obstruction, unspecified as to partial versus complete obstruction: Secondary | ICD-10-CM | POA: Diagnosis present

## 2018-09-01 DIAGNOSIS — E039 Hypothyroidism, unspecified: Secondary | ICD-10-CM | POA: Diagnosis present

## 2018-09-01 DIAGNOSIS — I509 Heart failure, unspecified: Secondary | ICD-10-CM | POA: Diagnosis not present

## 2018-09-01 DIAGNOSIS — Z7982 Long term (current) use of aspirin: Secondary | ICD-10-CM | POA: Diagnosis not present

## 2018-09-01 DIAGNOSIS — Z8249 Family history of ischemic heart disease and other diseases of the circulatory system: Secondary | ICD-10-CM | POA: Diagnosis not present

## 2018-09-01 DIAGNOSIS — E119 Type 2 diabetes mellitus without complications: Secondary | ICD-10-CM | POA: Diagnosis present

## 2018-09-01 DIAGNOSIS — J449 Chronic obstructive pulmonary disease, unspecified: Secondary | ICD-10-CM | POA: Diagnosis present

## 2018-09-01 DIAGNOSIS — K8020 Calculus of gallbladder without cholecystitis without obstruction: Secondary | ICD-10-CM | POA: Diagnosis not present

## 2018-09-01 DIAGNOSIS — Z4682 Encounter for fitting and adjustment of non-vascular catheter: Secondary | ICD-10-CM | POA: Diagnosis not present

## 2018-09-01 DIAGNOSIS — K567 Ileus, unspecified: Secondary | ICD-10-CM | POA: Diagnosis not present

## 2018-09-01 DIAGNOSIS — I110 Hypertensive heart disease with heart failure: Secondary | ICD-10-CM | POA: Diagnosis not present

## 2018-09-01 LAB — GLUCOSE, CAPILLARY
Glucose-Capillary: 135 mg/dL — ABNORMAL HIGH (ref 70–99)
Glucose-Capillary: 94 mg/dL (ref 70–99)
Glucose-Capillary: 85 mg/dL (ref 70–99)
Glucose-Capillary: 99 mg/dL (ref 70–99)

## 2018-09-01 LAB — COMPREHENSIVE METABOLIC PANEL
ALT: 8 U/L (ref 0–44)
AST: 15 U/L (ref 15–41)
Albumin: 4 g/dL (ref 3.5–5.0)
Alkaline Phosphatase: 75 U/L (ref 38–126)
Anion gap: 11 (ref 5–15)
BUN: 25 mg/dL — ABNORMAL HIGH (ref 8–23)
CO2: 32 mmol/L (ref 22–32)
Calcium: 9.3 mg/dL (ref 8.9–10.3)
Chloride: 96 mmol/L — ABNORMAL LOW (ref 98–111)
Creatinine, Ser: 0.84 mg/dL (ref 0.44–1.00)
GFR calc Af Amer: >60 mL/min (ref >60)
GFR calc non Af Amer: >60 mL/min (ref >60)
Glucose, Bld: 122 mg/dL — ABNORMAL HIGH (ref 70–99)
Potassium: 4.5 mmol/L (ref 3.5–5.1)
Sodium: 139 mmol/L (ref 135–145)
Total Bilirubin: 0.6 mg/dL (ref 0.3–1.2)
Total Protein: 7.5 g/dL (ref 6.5–8.1)

## 2018-09-01 LAB — URINALYSIS, COMPLETE (UACMP) WITH MICROSCOPIC
Bacteria, UA: NONE SEEN
Bilirubin Urine: NEGATIVE
Glucose, UA: NEGATIVE mg/dL
Hgb urine dipstick: NEGATIVE
Ketones, ur: NEGATIVE mg/dL
Leukocytes, UA: NEGATIVE
Nitrite: NEGATIVE
Protein, ur: NEGATIVE mg/dL
Specific Gravity, Urine: 1.009 (ref 1.005–1.030)
pH: 6 (ref 5.0–8.0)

## 2018-09-01 LAB — CBC
HCT: 37.9 % (ref 36.0–46.0)
Hemoglobin: 11.3 g/dL — ABNORMAL LOW (ref 12.0–15.0)
MCH: 28.7 pg (ref 26.0–34.0)
MCHC: 29.8 g/dL — ABNORMAL LOW (ref 30.0–36.0)
MCV: 96.2 fL (ref 80.0–100.0)
Platelets: 202 10*3/uL (ref 150–400)
RBC: 3.94 MIL/uL (ref 3.87–5.11)
RDW: 13.5 % (ref 11.5–15.5)
WBC: 9.2 10*3/uL (ref 4.0–10.5)
nRBC: 0 % (ref 0.0–0.2)

## 2018-09-01 LAB — LIPASE, BLOOD: Lipase: 22 U/L (ref 11–51)

## 2018-09-01 LAB — TROPONIN I: Troponin I: <0.03 ng/mL (ref <0.03)

## 2018-09-01 MED ORDER — HYDRALAZINE HCL 20 MG/ML IJ SOLN: 10.0000 mg | Freq: Four times a day (QID) | INTRAMUSCULAR | Status: DC | PRN

## 2018-09-01 MED ORDER — PROCHLORPERAZINE EDISYLATE 10 MG/2ML IJ SOLN
10.0000 mg | Freq: Once | INTRAMUSCULAR | Status: AC
  Administered 2018-09-01: 10 mg via INTRAVENOUS
  Filled 2018-09-01: qty 2

## 2018-09-01 MED ORDER — MORPHINE SULFATE (PF) 4 MG/ML IV SOLN
6.0000 mg | Freq: Once | INTRAVENOUS | Status: AC
  Administered 2018-09-01: 6 mg via INTRAVENOUS
  Filled 2018-09-01: qty 2

## 2018-09-01 MED ORDER — ENOXAPARIN SODIUM 40 MG/0.4ML ~~LOC~~ SOLN
40.0000 mg | SUBCUTANEOUS | Status: DC
  Administered 2018-09-02 – 2018-09-11 (×10): 40 mg via SUBCUTANEOUS
  Filled 2018-09-01 (×10): qty 0.4

## 2018-09-01 MED ORDER — ARFORMOTEROL TARTRATE 15 MCG/2ML IN NEBU
15.0000 ug | INHALATION_SOLUTION | Freq: Two times a day (BID) | RESPIRATORY_TRACT | Status: DC
  Administered 2018-09-01 – 2018-09-11 (×21): 15 ug via RESPIRATORY_TRACT
  Filled 2018-09-01 (×22): qty 2

## 2018-09-01 MED ORDER — DIPHENHYDRAMINE HCL 50 MG/ML IJ SOLN
25.0000 mg | Freq: Once | INTRAMUSCULAR | Status: AC
  Administered 2018-09-01: 25 mg via INTRAVENOUS
  Filled 2018-09-01: qty 1

## 2018-09-01 MED ORDER — HYDROMORPHONE HCL 1 MG/ML IJ SOLN
0.5000 mg | INTRAMUSCULAR | Status: DC | PRN
  Administered 2018-09-04 – 2018-09-06 (×3): 0.5 mg via INTRAVENOUS
  Filled 2018-09-01 (×4): qty 0.5

## 2018-09-01 MED ORDER — KETOROLAC TROMETHAMINE 30 MG/ML IJ SOLN
15.0000 mg | Freq: Four times a day (QID) | INTRAMUSCULAR | Status: AC
  Administered 2018-09-01 – 2018-09-05 (×15): 15 mg via INTRAVENOUS
  Filled 2018-09-01 (×15): qty 1

## 2018-09-01 MED ORDER — IOHEXOL 300 MG/ML  SOLN: 30.0000 mL | Freq: Once | INTRAMUSCULAR | Status: DC | PRN

## 2018-09-01 MED ORDER — LACTATED RINGERS IV SOLN
INTRAVENOUS | Status: DC
  Administered 2018-09-01 – 2018-09-02 (×4): via INTRAVENOUS

## 2018-09-01 MED ORDER — LACTATED RINGERS IV SOLN: 125.0000 mL/h | INTRAVENOUS | Status: DC

## 2018-09-01 MED ORDER — KETOROLAC TROMETHAMINE 30 MG/ML IJ SOLN: 30.0000 mg | Freq: Four times a day (QID) | INTRAMUSCULAR | Status: DC

## 2018-09-01 MED ORDER — IPRATROPIUM-ALBUTEROL 0.5-2.5 (3) MG/3ML IN SOLN: 3.0000 mL | Freq: Four times a day (QID) | RESPIRATORY_TRACT | Status: DC | PRN

## 2018-09-01 MED ORDER — ONDANSETRON 4 MG PO TBDP: 4.0000 mg | ORAL_TABLET | Freq: Four times a day (QID) | ORAL | Status: DC | PRN

## 2018-09-01 MED ORDER — LIDOCAINE HCL URETHRAL/MUCOSAL 2 % EX GEL
1.0000 "application " | Freq: Once | CUTANEOUS | Status: AC
  Administered 2018-09-01: 1 via TOPICAL
  Filled 2018-09-01: qty 5

## 2018-09-01 MED ORDER — INSULIN ASPART 100 UNIT/ML ~~LOC~~ SOLN
0.0000 [IU] | SUBCUTANEOUS | Status: DC
  Administered 2018-09-01: 3 [IU] via SUBCUTANEOUS
  Filled 2018-09-01: qty 1

## 2018-09-01 MED ORDER — MOMETASONE FURO-FORMOTEROL FUM 100-5 MCG/ACT IN AERO
2.0000 | INHALATION_SPRAY | Freq: Two times a day (BID) | RESPIRATORY_TRACT | Status: DC
  Administered 2018-09-01 – 2018-09-10 (×20): 2 via RESPIRATORY_TRACT
  Filled 2018-09-01: qty 8.8

## 2018-09-01 MED ORDER — IOPAMIDOL (ISOVUE-300) INJECTION 61%
100.0000 mL | Freq: Once | INTRAVENOUS | Status: AC | PRN
  Administered 2018-09-01: 100 mL via INTRAVENOUS

## 2018-09-01 MED ORDER — FAMOTIDINE IN NACL 20-0.9 MG/50ML-% IV SOLN
20.0000 mg | Freq: Two times a day (BID) | INTRAVENOUS | Status: DC
  Administered 2018-09-01 – 2018-09-10 (×20): 20 mg via INTRAVENOUS
  Filled 2018-09-01 (×20): qty 50

## 2018-09-01 MED ORDER — ONDANSETRON HCL 4 MG/2ML IJ SOLN
4.0000 mg | Freq: Once | INTRAMUSCULAR | Status: AC
  Administered 2018-09-01: 4 mg via INTRAVENOUS
  Filled 2018-09-01: qty 2

## 2018-09-01 MED ORDER — ONDANSETRON HCL 4 MG/2ML IJ SOLN
4.0000 mg | Freq: Four times a day (QID) | INTRAMUSCULAR | Status: DC | PRN
  Administered 2018-09-03 – 2018-09-06 (×5): 4 mg via INTRAVENOUS
  Filled 2018-09-01 (×5): qty 2

## 2018-09-01 MED ORDER — INFLUENZA VAC SPLIT HIGH-DOSE 0.5 ML IM SUSY
0.5000 mL | PREFILLED_SYRINGE | INTRAMUSCULAR | Status: DC
  Filled 2018-09-01: qty 0.5

## 2018-09-01 NOTE — ED Notes (Signed)
Pt had one episode of vomiting a small amount. Benadryl and Compazine ordered and given for nausea. Husband at bedside, no distress at this time.

## 2018-09-01 NOTE — Plan of Care (Signed)
The patient has been stable. NG tube was placed. NPO. Monitoring in/out fluids. Pain managed with PRN pain meds.  Problem: Education: Goal: Knowledge of General Education information will improve Description Including pain rating scale, medication(s)/side effects and non-pharmacologic comfort measures Outcome: Progressing   Problem: Health Behavior/Discharge Planning: Goal: Ability to manage health-related needs will improve Outcome: Progressing   Problem: Clinical Measurements: Goal: Ability to maintain clinical measurements within normal limits will improve Outcome: Progressing Goal: Will remain free from infection Outcome: Progressing Goal: Diagnostic test results will improve Outcome: Progressing Goal: Respiratory complications will improve Outcome: Progressing Goal: Cardiovascular complication will be avoided Outcome: Progressing   Problem: Activity: Goal: Risk for activity intolerance will decrease Outcome: Progressing   Problem: Nutrition: Goal: Adequate nutrition will be maintained Outcome: Progressing   Problem: Coping: Goal: Level of anxiety will decrease Outcome: Progressing   Problem: Elimination: Goal: Will not experience complications related to bowel motility Outcome: Progressing Goal: Will not experience complications related to urinary retention Outcome: Progressing   Problem: Pain Managment: Goal: General experience of comfort will improve Outcome: Progressing   Problem: Safety: Goal: Ability to remain free from injury will improve Outcome: Progressing   Problem: Skin Integrity: Goal: Risk for impaired skin integrity will decrease Outcome: Progressing

## 2018-09-01 NOTE — ED Notes (Signed)
Pt in bed, husband at bedside, pt prefers no NGT at this time, Dr Lamont Snowball aware.

## 2018-09-01 NOTE — ED Notes (Signed)
Rolling call made.

## 2018-09-01 NOTE — ED Provider Notes (Signed)
[facility] Emergency Department Provider Note  ____________________________________________   First MD Initiated Contact with Patient 09/01/18 [phone]     (approximate)  I have reviewed the triage vital signs and the nursing notes.   HISTORY  Chief Complaint Abdominal Pain   HPI Kristen Schroeder is a 77 y.o. female who self presents to the emergency department with 2 days of mid abdominal pain, nausea, and cramping discomfort.  She feels like she is been belching more.  She is passing flatus and most recently had a stool several hours prior to arrival.  She is had multiple abdominal surgeries most recently with a ventral hernia repair and small bowel obstruction 4 months ago at our hospital.  Her symptoms seem to come on gradually are slowly progressive are now constant.  She describes the pain as "cramping" diffuse throughout her abdomen and non-radiating.  Somewhat worse with eating and nothing particular makes it better.    Past Medical History:  Diagnosis Date  . Arthritis   . COPD (chronic obstructive pulmonary disease) ([place])   . Diabetes mellitus without complication ([place])   . Dysrhythmia   . Heart murmur   . History of orthopnea   . Hypertension   . Hypothyroidism   . Neuropathy   . Oxygen dependent    3L  CONTINUOUS  . Pain CHRONIC BACK PAIN  . Shortness of breath dyspnea   . Wheezing     Patient Active Problem List   Diagnosis Date Noted  . SBO (small bowel obstruction) ([place]) 04/22/2018  . Incarcerated hernia 04/14/2018  . Incarcerated hernia of abdominal cavity   . Aneurysm ([place]) 07/10/2016  . Nonspecific abnormal finding 05/11/2015  . Allergic rhinitis 05/11/2015  . Anxiety 05/11/2015  . Bronchitis, chronic ([place]) 05/11/2015  . CCF (congestive cardiac failure) ([place]) 05/11/2015  . Clinical depression 05/11/2015  . 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 Essential (primary) hypertension 05/11/2015  . Acid reflux  05/11/2015  . HLD (hyperlipidemia) 05/11/2015  . Adult hypothyroidism 05/11/2015  . Cervical dysplasia, mild 05/11/2015  . Neuropathy 05/11/2015  . Adiposity 05/11/2015  . Obstructive apnea 05/11/2015  . Arthritis, degenerative 05/11/2015    Past Surgical History:  Procedure Laterality Date  . ABDOMINAL HYSTERECTOMY    . BOWEL RESECTION N/A 04/14/2018   Procedure: SMALL BOWEL RESECTION;  Surgeon: Jules Husbands, MD;  Location: [facility] ORS;  Service: General;  Laterality: N/A;  . CATARACT EXTRACTION W/PHACO Left 07/03/2016   Procedure: CATARACT EXTRACTION PHACO AND INTRAOCULAR LENS PLACEMENT ([place]);  Surgeon: Birder Robson, MD;  Location: [facility] ORS;  Service: Ophthalmology;  Laterality: Left;  Lot: [id number] H [place]: 00:40.1 AP%: 17.4 CDE:6.94  . HERNIA REPAIR    . TUBAL LIGATION    . VENTRAL HERNIA REPAIR N/A 04/14/2018   Procedure: HERNIA REPAIR VENTRAL ADULT;  Surgeon: Jules Husbands, MD;  Location: [facility] ORS;  Service: General;  Laterality: N/A;    Prior to Admission medications   Medication Sig Start Date End Date Taking? Authorizing Provider  arformoterol (BROVANA) 15 MCG/2ML NEBU Take 2 mLs (15 mcg total) by nebulization 2 (two) times daily. 03/04/18  Yes Jerrol Banana., MD  aspirin EC 81 MG tablet Take 81 mg by mouth daily.   Yes [provider]  blood glucose meter kit and supplies KIT Dispense based on patient and insurance preference. Use up to four times daily as directed. (FOR ICD-9 250.00, 250.01). 06/27/18  Yes Jerrol Banana., MD  Blood Glucose Monitoring  Suppl (ONE TOUCH ULTRA 2) w/Device KIT 1 each by Does not apply route daily. 08/27/18  Yes Jerrol Banana., MD  fluticasone [facility]) 50 MCG/ACT nasal spray Place 1 spray into both nostrils daily as needed.    Yes [provider]  furosemide (LASIX) 20 MG tablet Take 1 tablet (20 mg total) by mouth daily. 05/20/18  Yes Jerrol Banana., MD  gabapentin (NEURONTIN) 300 MG capsule 2  tablets in the morning, 1 tablet in the afternoon and 2 tablets in the evening 05/21/18  Yes Jerrol Banana., MD  glucose blood (ONE TOUCH ULTRA TEST) test strip USE 1 STRIP 2 TIMES DAILY AND AS NEEDED 07/10/18  Yes Jerrol Banana., MD  ipratropium-albuterol (DUONEB) 0.5-2.5 (3) MG/3ML SOLN INHALE 1 VIAL VIA NEBULIZER EVERY 6 HOURS AS NEEDED. 07/02/18  Yes Allyne Gee, MD  isosorbide mononitrate (IMDUR) 30 MG 24 hr tablet Take 30 mg by mouth daily as needed (high bp).   Yes [provider]  levothyroxine (SYNTHROID, LEVOTHROID) 125 MCG tablet TAKE 1 TABLET BY MOUTH DAILY 08/05/18  Yes Jerrol Banana., MD  lisinopril (PRINIVIL,ZESTRIL) 20 MG tablet Take 1 tablet (20 mg total) by mouth daily. 05/20/18  Yes Jerrol Banana., MD  loratadine (CLARITIN) 10 MG tablet TAKE 1 TABLET (10 MG TOTAL) BY MOUTH DAILY. 05/06/18  Yes Jerrol Banana., MD  LORazepam (ATIVAN) 0.5 MG tablet Take 1 tablet (0.5 mg total) by mouth 2 (two) times daily as needed for anxiety. 05/20/18  Yes Jerrol Banana., MD  metFORMIN (GLUCOPHAGE) 1000 MG tablet TAKE 1 TABLET BY MOUTH TWICE A DAY Patient taking differently: Take 1,000 mg by mouth daily with breakfast.  08/05/18  Yes Jerrol Banana., MD  mometasone-formoterol [facility]) 100-5 MCG/ACT AERO Inhale 2 puffs into the lungs 2 (two) times daily. 03/20/18  Yes Lavera Guise, MD  MULTIPLE VITAMIN PO Take 1 tablet by mouth daily.    Yes [provider]  omeprazole (PRILOSEC) 40 MG capsule Take 1 capsule (40 mg total) by mouth daily. 02/13/17  Yes Jerrol Banana., MD  ondansetron (ZOFRAN-ODT) 4 MG disintegrating tablet TAKE 1 TABLET BY MOUTH EVERY 8 HOURS AS NEEDED FOR NAUSEA AND VOMITING 06/13/17  Yes Mar Daring, PA-C  OXYGEN Inhale 4 L into the lungs.   Yes [provider]  PARoxetine (PAXIL) 10 MG tablet Take 1 tablet (10 mg total) by mouth at bedtime. 05/20/18  Yes Jerrol Banana., MD  pravastatin  (PRAVACHOL) 10 MG tablet TAKE 1 TABLET (10 MG TOTAL) BY MOUTH DAILY. 06/04/18  Yes Jerrol Banana., MD    Allergies Codeine  Family History  Problem Relation Age of Onset  . Heart disease Mother   . Drug abuse Other   . Hypertension Other     Social History Social History   Tobacco Use  . Smoking status: Former Smoker    Packs/day: 0.50    Years: 15.00    Pack years: 7.50  . Smokeless tobacco: Never Used  . Tobacco comment: 30-40 years ago  Substance Use Topics  . Alcohol use: No    Alcohol/week: 0.0 standard drinks  . Drug use: No    Review of Systems Constitutional: No fever/chills Eyes: No visual changes. ENT: No sore throat. Cardiovascular: Denies chest pain. Respiratory: Denies shortness of breath. Gastrointestinal: Positive for abdominal pain.  Positive for nausea, no vomiting.  Positive for diarrhea.  No  constipation. Genitourinary: Negative for dysuria. Musculoskeletal: Negative for back pain. Skin: Negative for rash. Neurological: Negative for headaches, focal weakness or numbness.   ____________________________________________   PHYSICAL EXAM:  VITAL SIGNS: ED Triage Vitals  Enc Vitals Group     BP 09/01/18 0307 (!) 146/73     Pulse Rate 09/01/18 0307 89     Resp 09/01/18 0307 19     Temp 09/01/18 0307 98.7 F (37.1 C)     Temp Source 09/01/18 0307 Oral     SpO2 09/01/18 0307 94 %     Weight 09/01/18 0304 205 lb 0.4 oz (93 kg)     Height --      Head Circumference --      Peak Flow --      Pain Score 09/01/18 0303 0     Pain Loc --      Pain Edu? --      Excl. in [place]? --     Constitutional: Alert and oriented x4 appears obviously uncomfortable although nontoxic no diaphoresis speaks in full clear sentences Eyes: PERRL EOMI. Head: Atraumatic. Nose: No congestion/rhinnorhea. Mouth/Throat: No trismus Neck: No stridor.   Cardiovascular: Normal rate, regular rhythm. Grossly normal heart sounds.  Good peripheral  circulation. Respiratory: Normal respiratory effort.  No retractions. Lungs CTAB and moving good air Gastrointestinal: Distended although not tense mild diffuse tenderness with no rebound or guarding no peritonitis and no focality Musculoskeletal: No lower extremity edema   Neurologic:  Normal speech and language. No gross focal neurologic deficits are appreciated. Skin:  Skin is warm, dry and intact. No rash noted. Psychiatric: Mood and affect are normal. Speech and behavior are normal.    ____________________________________________   DIFFERENTIAL includes but not limited to  Small bowel obstruction, volvulus, enteritis, appendicitis ____________________________________________   LABS (all labs ordered are listed, but only abnormal results are displayed)  Labs Reviewed  COMPREHENSIVE METABOLIC PANEL - Abnormal; Notable for the following components:      Result Value   Chloride 96 (*)    Glucose, Bld 122 (*)    BUN 25 (*)    All other components within normal limits  CBC - Abnormal; Notable for the following components:   Hemoglobin 11.3 (*)    MCHC 29.8 (*)    All other components within normal limits  URINALYSIS, COMPLETE (UACMP) WITH MICROSCOPIC - Abnormal; Notable for the following components:   Color, Urine YELLOW (*)    APPearance CLEAR (*)    All other components within normal limits  LIPASE, BLOOD  TROPONIN I    Lab work reviewed by me with no acute disease __________________________________________  EKG  ED ECG REPORT I, Darel Hong, the attending physician, personally viewed and interpreted this ECG.  Date: 09/01/2018 EKG Time:  Rate: 89 Rhythm: normal sinus rhythm QRS Axis: Leftward axis Intervals: normal ST/T Wave abnormalities: normal Narrative Interpretation: no evidence of acute ischemia.  Poor R wave progression  ____________________________________________  RADIOLOGY  CT abdomen pelvis reviewed by me consistent with small bowel  obstruction ____________________________________________   PROCEDURES  Procedure(s) performed: no  Procedures  Critical Care performed: no  ____________________________________________   INITIAL IMPRESSION / ASSESSMENT AND PLAN / ED COURSE  Pertinent labs & imaging results that were available during my care of the patient were reviewed by me and considered in my medical decision making (see chart for details).   As part of my medical decision making, I reviewed the following data within the [place] History obtained  from family if available, nursing notes, old chart and ekg, as well as notes from prior ED visits.  The patient comes to the emergency department with abdominal pain nausea and several loose stools with a complex abdominal surgical history.  Lab work is reassuring and I gave the patient 6 mg of IV morphine and 4 mg of IV Zofran with improvement in her symptoms.  CT scan with IV contrast is concerning for small bowel obstruction with possible internal hernia.  I discussed the case with on-call general surgeon Dr. Hampton Abbot who has graciously agreed to admit the patient to his service.  NG tube is pending at this time.      ____________________________________________   FINAL CLINICAL IMPRESSION(S) / ED DIAGNOSES  Final diagnoses:  SBO (small bowel obstruction) ([place])      NEW MEDICATIONS STARTED DURING THIS VISIT:  New Prescriptions   No medications on file     Note:  This document was prepared using Dragon voice recognition software and may include unintentional dictation errors.     Darel Hong, MD 09/01/18 [phone]

## 2018-09-01 NOTE — ED Triage Notes (Signed)
Patient c/o right mid/lower abdominal pain beginning Friday. Patient reports nausea, denies emesis. Patient reports loose bowel movements today. Patient reports hx of SBO in June.

## 2018-09-01 NOTE — H&P (Signed)
Date of Admission:  09/01/2018  Reason for Admission:   Small bowel obstruction  History of Present Illness: Kristen Schroeder is a 77 y.o. female with a history of incarcerated ventral hernia repair in June 2019 with Dr. Dahlia Byes.  She had a post-op obstruction episode early on as well.  She presents to the ED because of worsening abdominal pain in the left side since Friday night (10/25), associated with nausea and vomiting.  She does report having a bowel movement yesterday and had flatus as well.  The pain is mostly on the left and mid abdomen, without radiation.  Denies any fevers at home.  Denies any new bulging sensation.  In the ED, her workup showed normal labs with a normal WBC, but a CT scan concerning for small bowel obstruction.  I have independently viewed the CT scan.  It is unclear if the obstruction is due to adhesions or from an internal hernia, difficult to distinguish between the two.  No free air or significant stranding to indicate ischemia or perforation.  After receiving toradol on the floor and NG tube placement, her pain is significantly improved.  Past Medical History: Past Medical History:  Diagnosis Date  . Arthritis   . COPD (chronic obstructive pulmonary disease) ([place])   . Diabetes mellitus without complication ([place])   . Dysrhythmia   . Heart murmur   . History of orthopnea   . Hypertension   . Hypothyroidism   . Neuropathy   . Oxygen dependent    3L  CONTINUOUS  . Pain CHRONIC BACK PAIN  . Shortness of breath dyspnea   . Wheezing      Past Surgical History: Past Surgical History:  Procedure Laterality Date  . ABDOMINAL HYSTERECTOMY    . BOWEL RESECTION N/A 04/14/2018   Procedure: SMALL BOWEL RESECTION;  Surgeon: Jules Husbands, MD;  Location: [facility] ORS;  Service: General;  Laterality: N/A;  . CATARACT EXTRACTION W/PHACO Left 07/03/2016   Procedure: CATARACT EXTRACTION PHACO AND INTRAOCULAR LENS PLACEMENT ([place]);  Surgeon: Birder Robson, MD;  Location:  [facility] ORS;  Service: Ophthalmology;  Laterality: Left;  Lot: [id number] H [place]: 00:40.1 AP%: 17.4 CDE:6.94  . HERNIA REPAIR    . TUBAL LIGATION    . VENTRAL HERNIA REPAIR N/A 04/14/2018   Procedure: HERNIA REPAIR VENTRAL ADULT;  Surgeon: Jules Husbands, MD;  Location: [facility] ORS;  Service: General;  Laterality: N/A;    Home Medications: Prior to Admission medications   Medication Sig Start Date End Date Taking? Authorizing Provider  arformoterol (BROVANA) 15 MCG/2ML NEBU Take 2 mLs (15 mcg total) by nebulization 2 (two) times daily. 03/04/18  Yes Jerrol Banana., MD  aspirin EC 81 MG tablet Take 81 mg by mouth daily.   Yes [provider]  blood glucose meter kit and supplies KIT Dispense based on patient and insurance preference. Use up to four times daily as directed. (FOR ICD-9 250.00, 250.01). 06/27/18  Yes Jerrol Banana., MD  Blood Glucose Monitoring Suppl (ONE TOUCH ULTRA 2) w/Device KIT 1 each by Does not apply route daily. 08/27/18  Yes Jerrol Banana., MD  fluticasone [facility]) 50 MCG/ACT nasal spray Place 1 spray into both nostrils daily as needed.    Yes [provider]  furosemide (LASIX) 20 MG tablet Take 1 tablet (20 mg total) by mouth daily. 05/20/18  Yes Jerrol Banana., MD  gabapentin (NEURONTIN) 300 MG capsule 2 tablets in the morning, 1 tablet in the afternoon  and 2 tablets in the evening 05/21/18  Yes Jerrol Banana., MD  glucose blood (ONE TOUCH ULTRA TEST) test strip USE 1 STRIP 2 TIMES DAILY AND AS NEEDED 07/10/18  Yes Jerrol Banana., MD  ipratropium-albuterol (DUONEB) 0.5-2.5 (3) MG/3ML SOLN INHALE 1 VIAL VIA NEBULIZER EVERY 6 HOURS AS NEEDED. 07/02/18  Yes Allyne Gee, MD  isosorbide mononitrate (IMDUR) 30 MG 24 hr tablet Take 30 mg by mouth daily as needed (high bp).   Yes [provider]  levothyroxine (SYNTHROID, LEVOTHROID) 125 MCG tablet TAKE 1 TABLET BY MOUTH DAILY 08/05/18  Yes Jerrol Banana., MD   lisinopril (PRINIVIL,ZESTRIL) 20 MG tablet Take 1 tablet (20 mg total) by mouth daily. 05/20/18  Yes Jerrol Banana., MD  loratadine (CLARITIN) 10 MG tablet TAKE 1 TABLET (10 MG TOTAL) BY MOUTH DAILY. 05/06/18  Yes Jerrol Banana., MD  LORazepam (ATIVAN) 0.5 MG tablet Take 1 tablet (0.5 mg total) by mouth 2 (two) times daily as needed for anxiety. 05/20/18  Yes Jerrol Banana., MD  metFORMIN (GLUCOPHAGE) 1000 MG tablet TAKE 1 TABLET BY MOUTH TWICE A DAY Patient taking differently: Take 1,000 mg by mouth daily with breakfast.  08/05/18  Yes Jerrol Banana., MD  mometasone-formoterol [facility]) 100-5 MCG/ACT AERO Inhale 2 puffs into the lungs 2 (two) times daily. 03/20/18  Yes Lavera Guise, MD  MULTIPLE VITAMIN PO Take 1 tablet by mouth daily.    Yes [provider]  omeprazole (PRILOSEC) 40 MG capsule Take 1 capsule (40 mg total) by mouth daily. 02/13/17  Yes Jerrol Banana., MD  ondansetron (ZOFRAN-ODT) 4 MG disintegrating tablet TAKE 1 TABLET BY MOUTH EVERY 8 HOURS AS NEEDED FOR NAUSEA AND VOMITING 06/13/17  Yes Mar Daring, PA-C  OXYGEN Inhale 4 L into the lungs.   Yes [provider]  PARoxetine (PAXIL) 10 MG tablet Take 1 tablet (10 mg total) by mouth at bedtime. 05/20/18  Yes Jerrol Banana., MD  pravastatin (PRAVACHOL) 10 MG tablet TAKE 1 TABLET (10 MG TOTAL) BY MOUTH DAILY. 06/04/18  Yes Jerrol Banana., MD    Allergies: Allergies  Allergen Reactions  . Codeine Nausea And Vomiting and Nausea Only    Social History:  reports that she has quit smoking. She has a 7.50 pack-year smoking history. She has never used smokeless tobacco. She reports that she does not drink alcohol or use drugs.   Family History: Family History  Problem Relation Age of Onset  . Heart disease Mother   . Drug abuse Other   . Hypertension Other     Review of Systems: Review of Systems  Constitutional: Negative for chills and fever.  HENT:  Negative for hearing loss.   Eyes: Negative for blurred vision.  Respiratory: Negative for shortness of breath.   Cardiovascular: Negative for chest pain.  Gastrointestinal: Positive for abdominal pain, nausea and vomiting. Negative for diarrhea.  Genitourinary: Negative for dysuria.  Musculoskeletal: Negative for myalgias.  Skin: Negative for rash.  Neurological: Negative for dizziness.  Psychiatric/Behavioral: Negative for depression.    Physical Exam BP (!) 132/43 (BP Location: Right Arm)   Pulse 73   Temp 98 F (36.7 C) (Oral)   Resp 16   Wt 93 kg   SpO2 99%   BMI 33.09 kg/m  CONSTITUTIONAL: No acute distress HEENT:  Normocephalic, atraumatic, extraocular motion intact. NECK: Trachea is midline, and there is no jugular venous  distension.  RESPIRATORY:  Lungs are clear, and breath sounds are equal bilaterally. Normal respiratory effort without pathologic use of accessory muscles. CARDIOVASCULAR: Heart is regular without murmurs, gallops, or rubs. GI: The abdomen is soft, obese, mildly distended, currently non-tender to palpation.  NG tube in place with bilious contents.  Well healed scars from prior hysterectomy (low midline) and ventral hernia repair (transverse mid abdomen).  MUSCULOSKELETAL:  Normal muscle strength and tone in all four extremities.  No peripheral edema or cyanosis. SKIN: Skin turgor is normal. There are no pathologic skin lesions.  NEUROLOGIC:  Motor and sensation is grossly normal.  Cranial nerves are grossly intact. PSYCH:  Alert and oriented to person, place and time. Affect is normal.  Laboratory Analysis: Results for orders placed or performed during the hospital encounter of 09/01/18 (from the past 24 hour(s))  Lipase, blood     Status: None   Collection Time: 09/01/18  3:12 AM  Result Value Ref Range   Lipase 22 11 - 51 U/L  Comprehensive metabolic panel     Status: Abnormal   Collection Time: 09/01/18  3:12 AM  Result Value Ref Range   Sodium  139 135 - 145 mmol/L   Potassium 4.5 3.5 - 5.1 mmol/L   Chloride 96 (L) 98 - 111 mmol/L   CO2 32 22 - 32 mmol/L   Glucose, Bld 122 (H) 70 - 99 mg/dL   BUN 25 (H) 8 - 23 mg/dL   Creatinine, Ser 0.84 0.44 - 1.00 mg/dL   Calcium 9.3 8.9 - 10.3 mg/dL   Total Protein 7.5 6.5 - 8.1 g/dL   Albumin 4.0 3.5 - 5.0 g/dL   AST 15 15 - 41 U/L   ALT 8 0 - 44 U/L   Alkaline Phosphatase 75 38 - 126 U/L   Total Bilirubin 0.6 0.3 - 1.2 mg/dL   GFR calc non Af Amer >60 >60 mL/min   GFR calc Af Amer >60 >60 mL/min   Anion gap 11 5 - 15  CBC     Status: Abnormal   Collection Time: 09/01/18  3:12 AM  Result Value Ref Range   WBC 9.2 4.0 - 10.5 K/uL   RBC 3.94 3.87 - 5.11 MIL/uL   Hemoglobin 11.3 (L) 12.0 - 15.0 g/dL   HCT 37.9 36.0 - 46.0 %   MCV 96.2 80.0 - 100.0 fL   MCH 28.7 26.0 - 34.0 pg   MCHC 29.8 (L) 30.0 - 36.0 g/dL   RDW 13.5 11.5 - 15.5 %   Platelets 202 150 - 400 K/uL   nRBC 0.0 0.0 - 0.2 %  Troponin I     Status: None   Collection Time: 09/01/18  3:12 AM  Result Value Ref Range   Troponin I <0.03 <0.03 ng/mL  Urinalysis, Complete w Microscopic     Status: Abnormal   Collection Time: 09/01/18  3:13 AM  Result Value Ref Range   Color, Urine YELLOW (A) YELLOW   APPearance CLEAR (A) CLEAR   Specific Gravity, Urine 1.009 1.005 - 1.030   pH 6.0 5.0 - 8.0   Glucose, UA NEGATIVE NEGATIVE mg/dL   Hgb urine dipstick NEGATIVE NEGATIVE   Bilirubin Urine NEGATIVE NEGATIVE   Ketones, ur NEGATIVE NEGATIVE mg/dL   Protein, ur NEGATIVE NEGATIVE mg/dL   Nitrite NEGATIVE NEGATIVE   Leukocytes, UA NEGATIVE NEGATIVE   RBC / HPF 0-5 0 - 5 RBC/hpf   WBC, UA 0-5 0 - 5 WBC/hpf   Bacteria, UA NONE SEEN  NONE SEEN   Squamous Epithelial / LPF 0-5 0 - 5   Hyaline Casts, UA PRESENT   Glucose, capillary     Status: Abnormal   Collection Time: 09/01/18  7:56 AM  Result Value Ref Range   Glucose-Capillary 135 (H) 70 - 99 mg/dL   Comment 1 Notify RN   Glucose, capillary     Status: None   Collection  Time: 09/01/18 12:12 PM  Result Value Ref Range   Glucose-Capillary 94 70 - 99 mg/dL   Comment 1 Notify RN     Imaging: Dg Abd 1 View  Result Date: 09/01/2018 CLINICAL DATA:  Status post nasogastric tube placement for small bowel obstruction. EXAM: ABDOMEN - 1 VIEW COMPARISON:  Abdominal radiographs of April 25, 2018 and abdominal CT scan of September 01, 2018 FINDINGS: The nasogastric tube tip and proximal port project in the mid gastric body. There are loops of mildly distended gas-filled small bowel in the upper abdomen. There are patchy airspace opacities in the right infrahilar region. IMPRESSION: Adequate placement of the esophagogastric tube. Persistent mild distention of multiple small bowel loops consistent with small bowel obstruction or ileus. Electronically Signed   By: David  Martinique M.D.   On: 09/01/2018 12:22   Ct Abdomen Pelvis W Contrast  Result Date: 09/01/2018 CLINICAL DATA:  77 y/o  F; right mid and lower abdominal pain. EXAM: CT ABDOMEN AND PELVIS WITH CONTRAST TECHNIQUE: Multidetector CT imaging of the abdomen and pelvis was performed using the standard protocol following bolus administration of intravenous contrast. CONTRAST:  17m ISOVUE-300 IOPAMIDOL (ISOVUE-300) INJECTION 61%, <See Chart> OMNIPAQUE IOHEXOL 300 MG/ML SOLN COMPARISON:  04/21/2018 CT abdomen and pelvis. FINDINGS: Lower chest: Stable bilateral lower lobe bronchiectasis. Hepatobiliary: Punctate calcification within the mid liver is stable compatible sequelae of prior infectious or inflammatory process. No focal liver abnormality is seen. Cholelithiasis. No gallbladder wall thickening. No biliary ductal dilatation. Pancreas: Unremarkable. No pancreatic ductal dilatation or surrounding inflammatory changes. Spleen: Normal in size without focal abnormality. Adrenals/Urinary Tract: Adrenal glands are unremarkable. Stable 12 mm cyst within the right interpolar kidney. Otherwise, kidneys are normal, without renal calculi,  focal lesion, or hydronephrosis. Bladder is unremarkable. Stomach/Bowel: Small bowel anastomosis in the left lower quadrant. Focally dilated loops of small bowel in the left hemiabdomen compatible small bowel obstruction possibly related to internal hernia or adhesion. No bowel wall thickening or findings of perforation. Appendix not identified, no pericecal inflammation. Vascular/Lymphatic: Aortic atherosclerosis. No enlarged abdominal or pelvic lymph nodes. Reproductive: Prostate is unremarkable. Other: Mid ventral abdominal hernia containing fat. Musculoskeletal: No fracture is seen. 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 Advanced discogenic degenerative changes of the lumbar spine with loss of intervertebral disc space height and vacuum phenomenon. IMPRESSION: 1. Focally dilated loops of small bowel in the left hemiabdomen compatible with small bowel obstruction possibly related to internal hernia or adhesion. No bowel wall thickening or findings of perforation. 2. Cholelithiasis. 3.  Aortic Atherosclerosis (ICD10-I70.0). 4. Stable bilateral lower lobe bronchiectasis. Electronically Signed   By: LKristine GarbeM.D.   On: 09/01/2018 04:58    Assessment and Plan: This is a 77y.o. female with small bowel obstruction.  Discussed with the patient that the fact that her symptoms are much better, it is reassuring and perhaps going away from internal hernia as an etiology.  Discussed with her the management of small bowel obstruction with NPO diet, IV fluids, and NG tube decompression.  If no improvement over  the next 2-3 days, then would likely need surgery, but otherwise would continue conservative management.  Will have appropriate pain and nausea control and will order her essential home medications.  Patient understands this plan and all of her questions have been answered.  KUB in the morning.     Melvyn Neth, MD [facility] Pg:  [phone]

## 2018-09-02 ENCOUNTER — Inpatient Hospital Stay: Payer: Medicare Other

## 2018-09-02 LAB — BASIC METABOLIC PANEL
Anion gap: 4 — ABNORMAL LOW (ref 5–15)
BUN: 21 mg/dL (ref 8–23)
CO2: 36 mmol/L — ABNORMAL HIGH (ref 22–32)
Calcium: 9.1 mg/dL (ref 8.9–10.3)
Chloride: 102 mmol/L (ref 98–111)
Creatinine, Ser: 0.66 mg/dL (ref 0.44–1.00)
GFR calc Af Amer: >60 mL/min (ref >60)
GFR calc non Af Amer: >60 mL/min (ref >60)
Glucose, Bld: 97 mg/dL (ref 70–99)
Potassium: 5 mmol/L (ref 3.5–5.1)
Sodium: 142 mmol/L (ref 135–145)

## 2018-09-02 LAB — CBC WITH DIFFERENTIAL/PLATELET
Abs Immature Granulocytes: 0.03 10*3/uL (ref 0.00–0.07)
Basophils Absolute: 0 10*3/uL (ref 0.0–0.1)
Basophils Relative: 0 %
Eosinophils Absolute: 0.2 10*3/uL (ref 0.0–0.5)
Eosinophils Relative: 3 %
HCT: 34.8 % — ABNORMAL LOW (ref 36.0–46.0)
Hemoglobin: 10.3 g/dL — ABNORMAL LOW (ref 12.0–15.0)
Immature Granulocytes: 0 %
Lymphocytes Relative: 18 %
Lymphs Abs: 1.4 10*3/uL (ref 0.7–4.0)
MCH: 28.8 pg (ref 26.0–34.0)
MCHC: 29.6 g/dL — ABNORMAL LOW (ref 30.0–36.0)
MCV: 97.2 fL (ref 80.0–100.0)
Monocytes Absolute: 0.7 10*3/uL (ref 0.1–1.0)
Monocytes Relative: 9 %
Neutro Abs: 5.3 10*3/uL (ref 1.7–7.7)
Neutrophils Relative %: 70 %
Platelets: 167 10*3/uL (ref 150–400)
RBC: 3.58 MIL/uL — ABNORMAL LOW (ref 3.87–5.11)
RDW: 13.4 % (ref 11.5–15.5)
WBC: 7.5 10*3/uL (ref 4.0–10.5)
nRBC: 0 % (ref 0.0–0.2)

## 2018-09-02 LAB — GLUCOSE, CAPILLARY
Glucose-Capillary: 100 mg/dL — ABNORMAL HIGH (ref 70–99)
Glucose-Capillary: 71 mg/dL (ref 70–99)
Glucose-Capillary: 87 mg/dL (ref 70–99)
Glucose-Capillary: 89 mg/dL (ref 70–99)
Glucose-Capillary: 90 mg/dL (ref 70–99)
Glucose-Capillary: 92 mg/dL (ref 70–99)

## 2018-09-02 LAB — MAGNESIUM: Magnesium: 2.4 mg/dL (ref 1.7–2.4)

## 2018-09-02 MED ORDER — KCL IN DEXTROSE-NACL 20-5-0.45 MEQ/L-%-% IV SOLN
INTRAVENOUS | Status: AC
  Administered 2018-09-02 – 2018-09-05 (×8): via INTRAVENOUS
  Filled 2018-09-02 (×10): qty 1000

## 2018-09-02 MED ORDER — PHENOL 1.4 % MT LIQD
1.0000 | OROMUCOSAL | Status: DC | PRN
  Administered 2018-09-02: 1 via OROMUCOSAL
  Filled 2018-09-02: qty 177

## 2018-09-02 NOTE — Progress Notes (Signed)
09/02/2018  Subjective: No acute events overnight.  Patient feels better today without any pain and reports having flatus overnight.  NG tube output was 500 ml.    Vital signs: Temp:  [98 F (36.7 C)] 98 F (36.7 C) (10/29 0414) Pulse Rate:  [73-93] 93 (10/29 0414) Resp:  [16-21] 18 (10/29 0414) BP: (125-156)/(43-70) 156/70 (10/29 0414) SpO2:  [92 %-100 %] 100 % (10/29 0816)   Intake/Output: 10/28 0701 - 10/29 0700 In: 2001.8 [I.V.:1901.8; IV Piggyback:100] Out: 1300 [Urine:800; Emesis/NG output:500] Last BM Date: 08/31/18  Physical Exam: Constitutional: No acute distress Abdomen:  Soft, non-distended, non-tender to palpation and with only some mild soreness with deep palpation.  Non-peritoneal.  Labs:  Recent Labs    09/01/18 0312 09/02/18 0338  WBC 9.2 7.5  HGB 11.3* 10.3*  HCT 37.9 34.8*  PLT 202 167   Recent Labs    09/01/18 0312 09/02/18 0338  NA 139 142  K 4.5 5.0  CL 96* 102  CO2 32 36*  GLUCOSE 122* 97  BUN 25* 21  CREATININE 0.84 0.66  CALCIUM 9.3 9.1   No results for input(s): LABPROT, INR in the last 72 hours.  Imaging: Dg Abd 1 View  Result Date: 09/02/2018 CLINICAL DATA:  77 year old female with small bowel obstruction. Hernia repair bowel resection June 2019. Subsequent encounter. EXAM: ABDOMEN - 1 VIEW COMPARISON:  09/01/2018 FINDINGS: Nasogastric tube tip gastric antrum level with side hole gastric body level. Persistent gas distended small bowel loop left aspect measuring up to 6 cm with slightly thickened folds (measured up to 4.7 cm on CT). This suggests persistent/progressive small bowel obstruction. Right base atelectasis suspected. Free air below right hemidiaphragm can not be excluded. Left greater right hip joint degenerative changes. Degenerative changes lumbar spine. IMPRESSION: 1. Right base atelectasis suspected. Free air below right hemidiaphragm cannot be not excluded. Upright chest x-ray or decubitus view of the abdomen  (right-side-up) recommended for further delineation. 2. Persistent gas distended small bowel loop left aspect measuring up to 6 cm with slightly thickened folds (measured up to 4.7 cm on CT). This suggests persistent/progressive small bowel obstruction. 3. Nasogastric tube tip gastric antrum level with side hole gastric body level. Electronically Signed   By: Lacy Duverney M.D.   On: 09/02/2018 09:23    Assessment/Plan: This is a 77 y.o. female with SBO.  --Though her KUB read reports a dilated loop of small bowel, clinically the patient feels much better.  There is no worsening pain in the area of concern in the CT scan and her WBC remains normal on no abx.  She has had some flatus too. --No acute surgical need at this point and will continue with conservative management.  Will keep NG tube in place today, possibly clamp overnight or tomorrow morning.     Howie Ill, MD [facility]

## 2018-09-02 NOTE — Progress Notes (Signed)
Initial Nutrition Assessment  DOCUMENTATION CODES:   Obesity unspecified  INTERVENTION:   RD will monitor for diet advancement vs the need for nutrition support   NUTRITION DIAGNOSIS:   Inadequate oral intake related to acute illness as evidenced by NPO status.  GOAL:   Patient will meet greater than or equal to 90% of their needs  MONITOR:   Diet advancement, Labs, Weight trends, Skin, I & O's  REASON FOR ASSESSMENT:   Malnutrition Screening Tool    ASSESSMENT:   77 y.o. female with a history of incarcerated ventral hernia repair in June 2019 admitted with SBO   Met with pt and family in room today. Pt reports poor appetite and oral intake since having her surgery in June. Pt reports that food just does not taste good anymore and she really has no desire to eat. Pt has been drinking some Ensure (vanilla or strawberry) at home but not regularly. Per chart, pt has lost 20lbs(9%) in the past 4 months; this is significant. Pt NPO with NGT in place and 56m output. Pt reports the last meal she ate was on Friday when she had a piece of fish and a banana. Pt required TPN during her last admit from 6/20-6/24. Pt reports passing flatus today but no BM yet. RD will monitor for diet advancement vs the need for nutrition support. Pt likely at high refeeding risk; monitor K, Mg and P labs daily once diet initiated.   Medications reviewed and include: lovenox, insulin, pepcid, LRS '@100ml' /hr  Labs reviewed: K 5.0 wnl, Mg 2.4 wnl Triglycerides 224(H)- 6/24 Hgb 10.3(L), Hct 34.8(L)  NUTRITION - FOCUSED PHYSICAL EXAM:    Most Recent Value  Orbital Region  No depletion  Upper Arm Region  No depletion  Thoracic and Lumbar Region  No depletion  Buccal Region  No depletion  Temple Region  No depletion  Clavicle Bone Region  No depletion  Clavicle and Acromion Bone Region  No depletion  Scapular Bone Region  No depletion  Dorsal Hand  No depletion  Patellar Region  Mild depletion   Anterior Thigh Region  Mild depletion  Posterior Calf Region  Mild depletion  Edema (RD Assessment)  None  Hair  Reviewed  Eyes  Reviewed  Mouth  Reviewed  Skin  Reviewed  Nails  Reviewed     Diet Order:   Diet Order            Diet NPO time specified  Diet effective now             EDUCATION NEEDS:   Education needs have been addressed  Skin:  Skin Assessment: Reviewed RN Assessment  Last BM:  10/27  Height:   Ht Readings from Last 1 Encounters:  09/02/18 '5\' 6"'  (1.676 m)    Weight:   Wt Readings from Last 1 Encounters:  09/01/18 93 kg    Ideal Body Weight:  59 kg  BMI:  Body mass index is 33.09 kg/m.  Estimated Nutritional Needs:   Kcal:  1700-2000kcal/day   Protein:  93-103g/day   Fluid:  >1.5L/day   CKoleen DistanceMS, RD, LDN Pager #- [phone]#- [phone] Hours Pager: [phone]

## 2018-09-02 NOTE — Progress Notes (Signed)
Notified Dr. Aleen Campi of xray results.  He acknowledged but no new orders.

## 2018-09-03 LAB — GLUCOSE, CAPILLARY
Glucose-Capillary: 102 mg/dL — ABNORMAL HIGH (ref 70–99)
Glucose-Capillary: 107 mg/dL — ABNORMAL HIGH (ref 70–99)
Glucose-Capillary: 134 mg/dL — ABNORMAL HIGH (ref 70–99)
Glucose-Capillary: 86 mg/dL (ref 70–99)
Glucose-Capillary: 93 mg/dL (ref 70–99)
Glucose-Capillary: 98 mg/dL (ref 70–99)

## 2018-09-03 LAB — CBC WITH DIFFERENTIAL/PLATELET
Abs Immature Granulocytes: 0.04 10*3/uL (ref 0.00–0.07)
Basophils Absolute: 0 10*3/uL (ref 0.0–0.1)
Basophils Relative: 0 %
Eosinophils Absolute: 0.2 10*3/uL (ref 0.0–0.5)
Eosinophils Relative: 3 %
HCT: 36 % (ref 36.0–46.0)
Hemoglobin: 11 g/dL — ABNORMAL LOW (ref 12.0–15.0)
Immature Granulocytes: 1 %
Lymphocytes Relative: 15 %
Lymphs Abs: 1.1 10*3/uL (ref 0.7–4.0)
MCH: 28.9 pg (ref 26.0–34.0)
MCHC: 30.6 g/dL (ref 30.0–36.0)
MCV: 94.5 fL (ref 80.0–100.0)
Monocytes Absolute: 0.6 10*3/uL (ref 0.1–1.0)
Monocytes Relative: 9 %
Neutro Abs: 5.2 10*3/uL (ref 1.7–7.7)
Neutrophils Relative %: 72 %
Platelets: 170 10*3/uL (ref 150–400)
RBC: 3.81 MIL/uL — ABNORMAL LOW (ref 3.87–5.11)
RDW: 13 % (ref 11.5–15.5)
WBC: 7.2 10*3/uL (ref 4.0–10.5)
nRBC: 0 % (ref 0.0–0.2)

## 2018-09-03 LAB — BASIC METABOLIC PANEL
Anion gap: 7 (ref 5–15)
BUN: 19 mg/dL (ref 8–23)
CO2: 33 mmol/L — ABNORMAL HIGH (ref 22–32)
Calcium: 9 mg/dL (ref 8.9–10.3)
Chloride: 102 mmol/L (ref 98–111)
Creatinine, Ser: 0.59 mg/dL (ref 0.44–1.00)
GFR calc Af Amer: >60 mL/min (ref >60)
GFR calc non Af Amer: >60 mL/min (ref >60)
Glucose, Bld: 108 mg/dL — ABNORMAL HIGH (ref 70–99)
Potassium: 4.2 mmol/L (ref 3.5–5.1)
Sodium: 142 mmol/L (ref 135–145)

## 2018-09-03 LAB — MAGNESIUM: Magnesium: 1.9 mg/dL (ref 1.7–2.4)

## 2018-09-03 MED ORDER — INSULIN ASPART 100 UNIT/ML ~~LOC~~ SOLN
0.0000 [IU] | Freq: Three times a day (TID) | SUBCUTANEOUS | Status: DC
  Administered 2018-09-03 – 2018-09-05 (×2): 3 [IU] via SUBCUTANEOUS
  Filled 2018-09-03 (×2): qty 1

## 2018-09-03 NOTE — Progress Notes (Signed)
09/03/2018  Subjective: No acute events overnight.  Her NG output was high yesterday but the patient reports passing a lot of flatus yesterday and overnight.  Denies any pain in her abdomen  Vital signs: Temp:  [97.6 F (36.4 C)-98.4 F (36.9 C)] 97.6 F (36.4 C) (10/30 0735) Pulse Rate:  [66-78] 66 (10/30 0735) Resp:  [16-18] 18 (10/30 0735) BP: (121-156)/(63-83) 146/68 (10/30 0735) SpO2:  [96 %-100 %] 100 % (10/30 0735)   Intake/Output: 10/29 0701 - 10/30 0700 In: 2064.2 [I.V.:1964.2; IV Piggyback:100] Out: 1250 [Urine:200; Emesis/NG output:1050] Last BM Date: 08/31/18  Physical Exam: Constitutional: No acute distress Abdomen: Soft, obese, nondistended, nontender to palpation.  NG tube in place to suction  Labs:  Recent Labs    09/02/18 0338 09/03/18 0849  WBC 7.5 7.2  HGB 10.3* 11.0*  HCT 34.8* 36.0  PLT 167 170   Recent Labs    09/02/18 0338 09/03/18 0849  NA 142 142  K 5.0 4.2  CL 102 102  CO2 36* 33*  GLUCOSE 97 108*  BUN 21 19  CREATININE 0.66 0.59  CALCIUM 9.1 9.0   No results for input(s): LABPROT, INR in the last 72 hours.  Imaging: No results found.  Assessment/Plan: This is a 77 y.o. female with small bowel obstruction.  - Although the NG output yesterday was elevated, she is passing gas and feels no pain at this point.  We will give her the benefit of the doubt and do a clamping trial of her NG tube today.  I have clamped the NG tube around 8:35 AM and will have her nurse check residuals around 1 PM.  If residuals less than 150, then NG tube may be removed. -If NG is removed we will probably start clear liquids today.   Howie Ill, MD [facility]

## 2018-09-04 ENCOUNTER — Encounter
Admission: EM | Disposition: A | Discharge status: Home / Self Care | Payer: Self-pay | Source: Home / Self Care | Attending: Surgery

## 2018-09-04 ENCOUNTER — Encounter: Payer: Self-pay | Admitting: Anesthesiology

## 2018-09-04 ENCOUNTER — Inpatient Hospital Stay: Payer: Medicare Other | Admitting: Anesthesiology

## 2018-09-04 ENCOUNTER — Inpatient Hospital Stay: Payer: Medicare Other

## 2018-09-04 HISTORY — PX: LAPAROTOMY: SHX154

## 2018-09-04 LAB — GLUCOSE, CAPILLARY
Glucose-Capillary: 89 mg/dL (ref 70–99)
Glucose-Capillary: 83 mg/dL (ref 70–99)
Glucose-Capillary: 140 mg/dL — ABNORMAL HIGH (ref 70–99)
Glucose-Capillary: 124 mg/dL — ABNORMAL HIGH (ref 70–99)

## 2018-09-04 SURGERY — LAPAROTOMY, EXPLORATORY
Anesthesia: General

## 2018-09-04 MED ORDER — PROPOFOL 10 MG/ML IV BOLUS
INTRAVENOUS | Status: DC | PRN
  Administered 2018-09-04: 100 mg via INTRAVENOUS

## 2018-09-04 MED ORDER — FENTANYL CITRATE (PF) 100 MCG/2ML IJ SOLN
INTRAMUSCULAR | Status: DC | PRN
  Administered 2018-09-04: 100 ug via INTRAVENOUS
  Administered 2018-09-04: 50 ug via INTRAVENOUS

## 2018-09-04 MED ORDER — LIDOCAINE HCL (PF) 2 % IJ SOLN
INTRAMUSCULAR | Status: AC
  Filled 2018-09-04: qty 10

## 2018-09-04 MED ORDER — SODIUM CHLORIDE 0.9 % IV SOLN
INTRAVENOUS | Status: DC | PRN
  Administered 2018-09-04: 30 mL

## 2018-09-04 MED ORDER — ACETAMINOPHEN 10 MG/ML IV SOLN
INTRAVENOUS | Status: AC
  Filled 2018-09-04: qty 100

## 2018-09-04 MED ORDER — FENTANYL CITRATE (PF) 100 MCG/2ML IJ SOLN
INTRAMUSCULAR | Status: AC
  Administered 2018-09-04: 25 ug via INTRAVENOUS
  Filled 2018-09-04: qty 2

## 2018-09-04 MED ORDER — CEFAZOLIN SODIUM 1 G IJ SOLR
INTRAMUSCULAR | Status: AC
  Filled 2018-09-04: qty 20

## 2018-09-04 MED ORDER — ROCURONIUM BROMIDE 50 MG/5ML IV SOLN
INTRAVENOUS | Status: AC
  Filled 2018-09-04: qty 1

## 2018-09-04 MED ORDER — PROMETHAZINE HCL 25 MG/ML IJ SOLN
6.2500 mg | INTRAMUSCULAR | Status: DC | PRN
  Administered 2018-09-04: 12.5 mg via INTRAVENOUS

## 2018-09-04 MED ORDER — MEPERIDINE HCL 50 MG/ML IJ SOLN: 6.2500 mg | INTRAMUSCULAR | Status: DC | PRN

## 2018-09-04 MED ORDER — ACETAMINOPHEN 10 MG/ML IV SOLN
INTRAVENOUS | Status: DC | PRN
  Administered 2018-09-04: 1000 mg via INTRAVENOUS

## 2018-09-04 MED ORDER — CEFAZOLIN SODIUM-DEXTROSE 2-3 GM-%(50ML) IV SOLR
INTRAVENOUS | Status: DC | PRN
  Administered 2018-09-04: 2 g via INTRAVENOUS

## 2018-09-04 MED ORDER — BUPIVACAINE-EPINEPHRINE (PF) 0.25% -1:200000 IJ SOLN
INTRAMUSCULAR | Status: AC
  Filled 2018-09-04: qty 30

## 2018-09-04 MED ORDER — FENTANYL CITRATE (PF) 100 MCG/2ML IJ SOLN
INTRAMUSCULAR | Status: AC
  Filled 2018-09-04: qty 2

## 2018-09-04 MED ORDER — DEXAMETHASONE SODIUM PHOSPHATE 10 MG/ML IJ SOLN
INTRAMUSCULAR | Status: AC
  Filled 2018-09-04: qty 1

## 2018-09-04 MED ORDER — SUGAMMADEX SODIUM 200 MG/2ML IV SOLN
INTRAVENOUS | Status: DC | PRN
  Administered 2018-09-04: 200 mg via INTRAVENOUS

## 2018-09-04 MED ORDER — SODIUM CHLORIDE FLUSH 0.9 % IV SOLN
INTRAVENOUS | Status: AC
  Filled 2018-09-04: qty 10

## 2018-09-04 MED ORDER — ROCURONIUM BROMIDE 100 MG/10ML IV SOLN
INTRAVENOUS | Status: DC | PRN
  Administered 2018-09-04: 30 mg via INTRAVENOUS
  Administered 2018-09-04: 10 mg via INTRAVENOUS
  Administered 2018-09-04: 20 mg via INTRAVENOUS

## 2018-09-04 MED ORDER — SODIUM CHLORIDE 0.9 % IV SOLN
INTRAVENOUS | Status: DC
  Administered 2018-09-04: 15:00:00 via INTRAVENOUS

## 2018-09-04 MED ORDER — PROMETHAZINE HCL 25 MG/ML IJ SOLN
INTRAMUSCULAR | Status: AC
  Filled 2018-09-04: qty 1

## 2018-09-04 MED ORDER — ACETAMINOPHEN 160 MG/5ML PO SOLN
325.0000 mg | ORAL | Status: DC | PRN
  Filled 2018-09-04: qty 20.3

## 2018-09-04 MED ORDER — LIDOCAINE HCL (CARDIAC) PF 100 MG/5ML IV SOSY
PREFILLED_SYRINGE | INTRAVENOUS | Status: DC | PRN
  Administered 2018-09-04: 60 mg via INTRAVENOUS

## 2018-09-04 MED ORDER — FENTANYL CITRATE (PF) 100 MCG/2ML IJ SOLN
25.0000 ug | INTRAMUSCULAR | Status: DC | PRN
  Administered 2018-09-04 (×2): 25 ug via INTRAVENOUS

## 2018-09-04 MED ORDER — ONDANSETRON HCL 4 MG/2ML IJ SOLN
INTRAMUSCULAR | Status: AC
  Filled 2018-09-04: qty 2

## 2018-09-04 MED ORDER — ONDANSETRON HCL 4 MG/2ML IJ SOLN
INTRAMUSCULAR | Status: DC | PRN
  Administered 2018-09-04: 4 mg via INTRAVENOUS

## 2018-09-04 MED ORDER — ACETAMINOPHEN 325 MG PO TABS: 325.0000 mg | ORAL_TABLET | ORAL | Status: DC | PRN

## 2018-09-04 MED ORDER — OXYMETAZOLINE HCL 0.05 % NA SOLN
NASAL | Status: AC
  Filled 2018-09-04: qty 15

## 2018-09-04 MED ORDER — BUPIVACAINE LIPOSOME 1.3 % IJ SUSP
INTRAMUSCULAR | Status: AC
  Filled 2018-09-04: qty 20

## 2018-09-04 MED ORDER — BUPIVACAINE-EPINEPHRINE (PF) 0.25% -1:200000 IJ SOLN
INTRAMUSCULAR | Status: DC | PRN
  Administered 2018-09-04: 30 mL

## 2018-09-04 MED ORDER — DEXAMETHASONE SODIUM PHOSPHATE 10 MG/ML IJ SOLN
INTRAMUSCULAR | Status: DC | PRN
  Administered 2018-09-04: 5 mg via INTRAVENOUS

## 2018-09-04 SURGICAL SUPPLY — 36 items
CANISTER SUCT 1200ML W/VALVE (MISCELLANEOUS) ×3 IMPLANT
CHLORAPREP W/TINT 10.5 ML (MISCELLANEOUS) ×6 IMPLANT
COVER WAND RF STERILE (DRAPES) ×3 IMPLANT
DRAPE LAPAROTOMY 100X77 ABD (DRAPES) ×3 IMPLANT
DRSG OPSITE POSTOP 4X12 (GAUZE/BANDAGES/DRESSINGS) ×3 IMPLANT
DRSG OPSITE POSTOP 4X8 (GAUZE/BANDAGES/DRESSINGS) ×3 IMPLANT
DRSG TEGADERM 4X10 (GAUZE/BANDAGES/DRESSINGS) IMPLANT
ELECT BLADE 6.5 EXT (BLADE) ×3 IMPLANT
ELECT CAUTERY BLADE 6.4 (BLADE) ×3 IMPLANT
ELECT REM PT RETURN 9FT ADLT (ELECTROSURGICAL) ×3
ELECTRODE REM PT RTRN 9FT ADLT (ELECTROSURGICAL) ×1 IMPLANT
GAUZE SPONGE 4X4 12PLY STRL (GAUZE/BANDAGES/DRESSINGS) ×3 IMPLANT
GLOVE SURG SYN 7.0 (GLOVE) ×12 IMPLANT
GLOVE SURG SYN 7.5  E (GLOVE) ×8
GLOVE SURG SYN 7.5 E (GLOVE) ×4 IMPLANT
GOWN STRL REUS W/ TWL LRG LVL3 (GOWN DISPOSABLE) ×4 IMPLANT
GOWN STRL REUS W/TWL LRG LVL3 (GOWN DISPOSABLE) ×8
LABEL OR SOLS (LABEL) ×3 IMPLANT
LIGASURE IMPACT 36 18CM CVD LR (INSTRUMENTS) IMPLANT
NEEDLE HYPO 22GX1.5 SAFETY (NEEDLE) ×3 IMPLANT
NS IRRIG 1000ML POUR BTL (IV SOLUTION) ×3 IMPLANT
PACK BASIN MAJOR ARMC (MISCELLANEOUS) ×3 IMPLANT
PACK COLON CLEAN CLOSURE (MISCELLANEOUS) IMPLANT
SEPRAFILM MEMBRANE 5X6 (MISCELLANEOUS) IMPLANT
STAPLER SKIN PROX 35W (STAPLE) ×3 IMPLANT
SUT PDS AB 1 TP1 54 (SUTURE) ×6 IMPLANT
SUT PDS AB 1 TP1 96 (SUTURE) IMPLANT
SUT PROLENE 0 CT 1 30 (SUTURE) IMPLANT
SUT SILK 2 0 (SUTURE) ×2
SUT SILK 2-0 18XBRD TIE 12 (SUTURE) ×1 IMPLANT
SUT SILK 3-0 (SUTURE) ×6 IMPLANT
SUT VIC AB 3-0 SH 27 (SUTURE) ×2
SUT VIC AB 3-0 SH 27X BRD (SUTURE) ×1 IMPLANT
SYR 10ML LL (SYRINGE) ×3 IMPLANT
TAPE TRANSPORE STRL 2 31045 (GAUZE/BANDAGES/DRESSINGS) ×3 IMPLANT
TRAY FOLEY MTR SLVR 16FR STAT (SET/KITS/TRAYS/PACK) ×3 IMPLANT

## 2018-09-04 NOTE — Progress Notes (Addendum)
[facility] Progress Note     Subjective: Per RN staff, patient with some abdominal pain and nausea overnight which she associated with eating her clear liquid diet. This morning, her pain and nausea have resolved however she has been NPO. Continues to pass flatus but no reported BM since admission. No complaints of fever, chills.   Objective: Vital signs in last 24 hours: Temp:  [97.5 F (36.4 C)-98.4 F (36.9 C)] 98 F (36.7 C) (10/31 0917) Pulse Rate:  [66-80] 66 (10/31 0917) Resp:  [12-20] 12 (10/31 0917) BP: (92-150)/(54-77) 138/54 (10/31 0917) SpO2:  [100 %] 100 % (10/31 0917) Last BM Date: 08/31/18  Intake/Output from previous day: 10/30 0701 - 10/31 0700 In: 2587.8 [P.O.:120; I.V.:2324.1; IV Piggyback:143.7] Out: 2350 [Urine:1400; Emesis/NG output:950] Intake/Output this shift: No intake/output data recorded.  PE: Gen:  Alert, NAD, pleasant Pulm:  Normal effort Abd: Soft, non-tender, non-distended Skin: warm and dry, no rashes  Psych: A&Ox3   Lab Results:  Recent Labs    09/02/18 0338 09/03/18 0849  WBC 7.5 7.2  HGB 10.3* 11.0*  HCT 34.8* 36.0  PLT 167 170   BMET Recent Labs    09/02/18 0338 09/03/18 0849  NA 142 142  K 5.0 4.2  CL 102 102  CO2 36* 33*  GLUCOSE 97 108*  BUN 21 19  CREATININE 0.66 0.59  CALCIUM 9.1 9.0   PT/INR No results for input(s): LABPROT, INR in the last 72 hours. CMP     Component Value Date/Time   NA 142 09/03/2018 0849   NA 149 (H) 03/04/2018 1235   K 4.2 09/03/2018 0849   CL 102 09/03/2018 0849   CO2 33 (H) 09/03/2018 0849   GLUCOSE 108 (H) 09/03/2018 0849   BUN 19 09/03/2018 0849   BUN 13 03/04/2018 1235   CREATININE 0.59 09/03/2018 0849   CALCIUM 9.0 09/03/2018 0849   PROT 7.5 09/01/2018 0312   PROT 5.9 (L) 03/04/2018 1235   ALBUMIN 4.0 09/01/2018 0312   ALBUMIN 3.7 03/04/2018 1235   AST 15 09/01/2018 0312   ALT 8 09/01/2018 0312   ALKPHOS 75 09/01/2018 0312   BILITOT 0.6 09/01/2018  0312   BILITOT <0.2 03/04/2018 1235   GFRNONAA >60 09/03/2018 0849   GFRAA >60 09/03/2018 0849   Lipase     Component Value Date/Time   LIPASE 22 09/01/2018 0312       Studies/Results: Dg Abd 1 View  Result Date: 09/04/2018 CLINICAL DATA:  Follow-up ileus EXAM: ABDOMEN - 1 VIEW COMPARISON:  09/02/2018 FINDINGS: Nasogastric catheter has been removed in the interval. Contrast material is noted within the colon from prior CT examination. Scattered large and small bowel gas is noted. A few mildly prominent loops of small bowel are noted in the left mid abdomen no definitive free air is seen. No bony abnormality is noted. IMPRESSION: Mildly prominent left mid abdominal small bowel loops. No other focal abnormality is noted. Electronically Signed   By: Alcide Clever M.D.   On: 09/04/2018 09:20    Anti-infectives: Anti-infectives (From admission, onward)   None       Assessment/Plan  Kristen Schroeder is a 77 y.o. female with a small bowel obstruction  - Repeat KUB this morning given reports of pain/nausea overnight shows mildly prominent left mid-abdominal small bowel loops.  - Continue NPO this morning - Continue IVF - Monitor abdominal exam and on-going bowel function - Mobilize as tolerates - DVT prophylaxis  - Will discuss KUB results with  Dr. Aleen Campi for potential exploratory laparotomy this morning -vs- continued conservative management.     LOS: 3 days    Lynden Oxford , PA-C [facility] 09/04/2018, 9:59 AM [phone] M-F: 7am - 4pm

## 2018-09-04 NOTE — Anesthesia Procedure Notes (Signed)
Procedure Name: Intubation Date/Time: 09/04/2018 3:59 PM Performed by: Omer Jack, CRNA Pre-anesthesia Checklist: Patient identified, Patient being monitored, Timeout performed, Emergency Drugs available and Suction available Patient Re-evaluated:Patient Re-evaluated prior to induction Oxygen Delivery Method: Circle system utilized Preoxygenation: Pre-oxygenation with 100% oxygen Induction Type: IV induction Ventilation: Mask ventilation without difficulty Laryngoscope Size: Miller and 2 Grade View: Grade I Tube type: Oral Tube size: 7.0 mm Number of attempts: 1 Placement Confirmation: ETT inserted through vocal cords under direct vision,  positive ETCO2 and breath sounds checked- equal and bilateral Secured at: 21 cm Tube secured with: Tape Dental Injury: Teeth and Oropharynx as per pre-operative assessment

## 2018-09-04 NOTE — Anesthesia Preprocedure Evaluation (Signed)
Anesthesia Evaluation  Patient identified by MRN, date of birth, ID band Patient awake    Reviewed: Allergy & Precautions, H&P , NPO status , reviewed documented beta blocker date and time   Airway Mallampati: II  TM Distance: >3 FB Neck ROM: full    Dental  (+) Missing, Poor Dentition, Partial Upper   Pulmonary shortness of breath and Long-Term Oxygen Therapy, sleep apnea , COPD, former smoker,     + decreased breath sounds      Cardiovascular hypertension, +CHF and + Orthopnea  + dysrhythmias + Valvular Problems/Murmurs  Rhythm:regular     Neuro/Psych PSYCHIATRIC DISORDERS Anxiety Depression    GI/Hepatic GERD  Controlled,  Endo/Other  diabetesHypothyroidism   Renal/GU      Musculoskeletal  (+) Arthritis ,   Abdominal   Peds  Hematology   Anesthesia Other Findings Past Medical History: No date: Arthritis No date: COPD (chronic obstructive pulmonary disease) (HCC) No date: Diabetes mellitus without complication (HCC) No date: Dysrhythmia No date: Heart murmur No date: History of orthopnea No date: Hypertension No date: Hypothyroidism No date: Neuropathy No date: Oxygen dependent     Comment:  3L  CONTINUOUS CHRONIC BACK PAIN: Pain No date: Shortness of breath dyspnea No date: Wheezing Past Surgical History: No date: ABDOMINAL HYSTERECTOMY 04/14/2018: BOWEL RESECTION; N/A     Comment:  Procedure: SMALL BOWEL RESECTION;  Surgeon: Leafy Ro, MD;  Location: [facility] ORS;  Service: General;                Laterality: N/A; 07/03/2016: CATARACT EXTRACTION W/PHACO; Left     Comment:  Procedure: CATARACT EXTRACTION PHACO AND INTRAOCULAR               LENS PLACEMENT (IOC);  Surgeon: Galen Manila, MD;                Location: [facility] ORS;  Service: Ophthalmology;  Laterality:              Left;  Lot: [id number] H [place]: 00:40.1 AP%: 17.4 CDE:6.94 No date: HERNIA REPAIR No date: TUBAL LIGATION 04/14/2018:  VENTRAL HERNIA REPAIR; N/A     Comment:  Procedure: HERNIA REPAIR VENTRAL ADULT;  Surgeon: Leafy Ro, MD;  Location: [facility] ORS;  Service: General;                Laterality: N/A; BMI    Body Mass Index:  33.09 kg/m     Reproductive/Obstetrics                             Anesthesia Physical Anesthesia Plan  ASA: III  Anesthesia Plan: General ETT   Post-op Pain Management:    Induction: Intravenous  PONV Risk Score and Plan: 4 or greater and Ondansetron, Treatment may vary due to age or medical condition and Promethazine  Airway Management Planned: Oral ETT  Additional Equipment:   Intra-op Plan:   Post-operative Plan: Extubation in OR and Post-operative intubation/ventilation  Informed Consent: I have reviewed the patients History and Physical, chart, labs and discussed the procedure including the risks, benefits and alternatives for the proposed anesthesia with the patient or authorized representative who has indicated his/her understanding and acceptance.   Dental Advisory Given  Plan Discussed with: CRNA  Anesthesia Plan Comments:  Anesthesia Quick Evaluation  

## 2018-09-04 NOTE — Transfer of Care (Signed)
Immediate Anesthesia Transfer of Care Note  Patient: Kristen Schroeder  Procedure(s) Performed: EXPLORATORY LAPAROTOMY (N/A )  Patient Location: PACU  Anesthesia Type:General  Level of Consciousness: drowsy and patient cooperative  Airway & Oxygen Therapy: Patient Spontanous Breathing and Patient connected to face mask oxygen  Post-op Assessment: Report given to RN and Post -op Vital signs reviewed and stable  Post vital signs: Reviewed and stable  Last Vitals:  Vitals Value Taken Time  BP 151/66 09/04/2018  6:05 PM  Temp    Pulse 77 09/04/2018  6:06 PM  Resp 14 09/04/2018  6:06 PM  SpO2 100 % 09/04/2018  6:06 PM  Vitals shown include unvalidated device data.  Last Pain:  Vitals:   09/04/18 1454  TempSrc:   PainSc: 0-No pain      Patients Stated Pain Goal: 0 (09/01/18 0755)  Complications: No apparent anesthesia complications

## 2018-09-04 NOTE — Op Note (Signed)
  Procedure Date:  09/04/2018  Pre-operative Diagnosis:   Small bowel obstruction  Post-operative Diagnosis:  Small bowel obstruction  Procedure:  Exploratory Laparotomy and lysis of adhesions  Surgeon:  Howie Ill, MD  Assistants:  Hulda Marin, MD, and Uvaldo Bristle, PA-S.  Dr. Thelma Barge' assistance was critical due to the complexity of the case and for retraction and lysis of adhesions.  Anesthesia:  General endotracheal  Estimated Blood Loss:  10 ml  Specimens:  None  Complications:  None  Indications for Procedure:  This is a 77 y.o. female who presents with small bowel obstruction not resolving with conservative management.  The risks of bleeding, abscess or infection, injury to surrounding structures, and need for further procedures were all discussed with the patient and was willing to proceed.  Description of Procedure: The patient was correctly identified in the preoperative area and brought into the operating room.  The patient was placed supine with VTE prophylaxis in place.  Appropriate time-outs were performed.  Anesthesia was induced and the patient was intubated.  Foley catheter was placed.  Appropriate antibiotics were infused.  The abdomen was prepped and draped in a sterile fashion.  An upper midline incision was made and electrocautery was used to dissect down the subcutaneous tissue to the fascia.  The fascia was incised and extended superiorly and inferiorly.  The abdomen was explored revealing a two dense bands of adhesion in the left abdomen that was contributing to an internal hernia and twisting of the small bowel.  The bowel was not ischemic.  The bowel proximal to this was dilated and decompressed distal to it.  We proceeded to run the bowel from Ligament of Treitz to the terminal ileum, doing adhesiolysis throughout.  We did identify an umbilical hernia which was reduced.  60 ml of Exparel solution was infiltrated into the peritoneum, fascia, and dermis.  The  fascia was closed using #1 PDS x 2 sutures, incorporating and closing the umbilical hernia as well.  The wound was irrigated and closed with staples.  The wound was cleaned and dressed with Honeycomb dressing.  The patient was emerged from anesthesia and extubated and brought to the recovery room for further management.  The patient tolerated the procedure well and all counts were correct at the end of the case.   Howie Ill, MD

## 2018-09-04 NOTE — Anesthesia Post-op Follow-up Note (Signed)
Anesthesia QCDR form completed.        

## 2018-09-04 NOTE — Care Management Important Message (Signed)
Copy of signed IM left with patient in room.  

## 2018-09-04 NOTE — Anesthesia Postprocedure Evaluation (Signed)
Anesthesia Post Note  Patient: Kristen Schroeder  Procedure(s) Performed: EXPLORATORY LAPAROTOMY (N/A )  Patient location during evaluation: PACU Anesthesia Type: General Level of consciousness: awake and alert Pain management: pain level controlled Vital Signs Assessment: post-procedure vital signs reviewed and stable Respiratory status: spontaneous breathing, nonlabored ventilation, respiratory function stable and patient connected to nasal cannula oxygen Cardiovascular status: blood pressure returned to baseline and stable Postop Assessment: no apparent nausea or vomiting Anesthetic complications: no     Last Vitals:  Vitals:   09/04/18 1954 09/04/18 2017  BP:  (!) 91/58  Pulse:  68  Resp:  15  Temp:  36.5 C  SpO2: 96% 100%    Last Pain:  Vitals:   09/04/18 2017  TempSrc: Axillary  PainSc:                  Lenard Simmer

## 2018-09-05 ENCOUNTER — Encounter: Payer: Self-pay | Admitting: Surgery

## 2018-09-05 ENCOUNTER — Inpatient Hospital Stay: Payer: Self-pay

## 2018-09-05 LAB — BASIC METABOLIC PANEL
Anion gap: 6 (ref 5–15)
BUN: 10 mg/dL (ref 8–23)
CO2: 29 mmol/L (ref 22–32)
Calcium: 7.9 mg/dL — ABNORMAL LOW (ref 8.9–10.3)
Chloride: 105 mmol/L (ref 98–111)
Creatinine, Ser: 0.71 mg/dL (ref 0.44–1.00)
GFR calc Af Amer: >60 mL/min (ref >60)
GFR calc non Af Amer: >60 mL/min (ref >60)
Glucose, Bld: 96 mg/dL (ref 70–99)
Potassium: 4.6 mmol/L (ref 3.5–5.1)
Sodium: 140 mmol/L (ref 135–145)

## 2018-09-05 LAB — CBC WITH DIFFERENTIAL/PLATELET
Abs Immature Granulocytes: 0.07 10*3/uL (ref 0.00–0.07)
Basophils Absolute: 0 10*3/uL (ref 0.0–0.1)
Basophils Relative: 0 %
Eosinophils Absolute: 0 10*3/uL (ref 0.0–0.5)
Eosinophils Relative: 0 %
HCT: 36.1 % (ref 36.0–46.0)
Hemoglobin: 10.9 g/dL — ABNORMAL LOW (ref 12.0–15.0)
Immature Granulocytes: 1 %
Lymphocytes Relative: 10 %
Lymphs Abs: 1.2 10*3/uL (ref 0.7–4.0)
MCH: 28.9 pg (ref 26.0–34.0)
MCHC: 30.2 g/dL (ref 30.0–36.0)
MCV: 95.8 fL (ref 80.0–100.0)
Monocytes Absolute: 1.2 10*3/uL — ABNORMAL HIGH (ref 0.1–1.0)
Monocytes Relative: 9 %
Neutro Abs: 9.8 10*3/uL — ABNORMAL HIGH (ref 1.7–7.7)
Neutrophils Relative %: 80 %
Platelets: 193 10*3/uL (ref 150–400)
RBC: 3.77 MIL/uL — ABNORMAL LOW (ref 3.87–5.11)
RDW: 13.3 % (ref 11.5–15.5)
WBC: 12.3 10*3/uL — ABNORMAL HIGH (ref 4.0–10.5)
nRBC: 0 % (ref 0.0–0.2)

## 2018-09-05 LAB — GLUCOSE, CAPILLARY
Glucose-Capillary: 105 mg/dL — ABNORMAL HIGH (ref 70–99)
Glucose-Capillary: 90 mg/dL (ref 70–99)
Glucose-Capillary: 98 mg/dL (ref 70–99)
Glucose-Capillary: 92 mg/dL (ref 70–99)
Glucose-Capillary: 98 mg/dL (ref 70–99)

## 2018-09-05 LAB — MAGNESIUM: Magnesium: 1.7 mg/dL (ref 1.7–2.4)

## 2018-09-05 MED ORDER — SODIUM CHLORIDE 0.9% FLUSH: 10.0000 mL | INTRAVENOUS | Status: DC | PRN

## 2018-09-05 MED ORDER — SODIUM CHLORIDE 0.9 % IV SOLN
INTRAVENOUS | Status: DC
  Administered 2018-09-05 – 2018-09-10 (×6): via INTRAVENOUS

## 2018-09-05 MED ORDER — TRACE MINERALS CR-CU-MN-SE-ZN 10-1000-500-60 MCG/ML IV SOLN
INTRAVENOUS | Status: AC
  Administered 2018-09-05: 17:00:00 via INTRAVENOUS
  Filled 2018-09-05: qty 960

## 2018-09-05 MED ORDER — FAT EMULSION PLANT BASED 20 % IV EMUL
250.0000 mL | INTRAVENOUS | Status: AC
  Administered 2018-09-05: 250 mL via INTRAVENOUS
  Filled 2018-09-05: qty 250

## 2018-09-05 MED ORDER — INSULIN ASPART 100 UNIT/ML ~~LOC~~ SOLN
0.0000 [IU] | Freq: Four times a day (QID) | SUBCUTANEOUS | Status: DC
  Administered 2018-09-06 – 2018-09-07 (×2): 3 [IU] via SUBCUTANEOUS
  Administered 2018-09-08: 1 [IU] via SUBCUTANEOUS
  Administered 2018-09-09: 3 [IU] via SUBCUTANEOUS
  Administered 2018-09-09: 1 [IU] via SUBCUTANEOUS
  Administered 2018-09-10 – 2018-09-11 (×4): 3 [IU] via SUBCUTANEOUS
  Filled 2018-09-05 (×9): qty 1

## 2018-09-05 NOTE — Consult Note (Signed)
PHARMACY - ADULT TOTAL PARENTERAL NUTRITION CONSULT NOTE   Patient Measurements: Height: 5\' 6"  (167.6 cm) Weight: 205 lb 0.4 oz (93 kg) IBW/kg (Calculated) : 59.3   Body mass index is 33.09 kg/m.  Assessment: 77 y.o.female who is 1 day s/p exploratory laparotomy with lysis of adhesions secondary to SBO. Her last po intake was 6 days ago  GI: famotidine Endo: SSI Insulin requirements in the past 24 hours: 3 units Lytes: will check P, K, and Mg daily for 3 days after TPN iniatition Renal: SCr<1 Pulm: Cards:  Hepatobil: Neuro: ID: WBC wnl, afebrile  TPN Access: 09/05/18 TPN start date: 09/05/18 Nutritional Goals (per RD recommendation on 09/05/18): KCal: 1894 Protein: 100g Fluid:  Goal TPN rate is 83 ml/hr (provides 90% of needs)  Current Nutrition: none  Plan:  E 5/15 TPN at 34mL/hr + lipid emulsion 20% at 79ml/hr x 12 hours This TPN provides 48 g of protein, 144 g of dextrose, and 36 g of lipids which provides 912 kCals per day, meeting 43% of patient needs Electrolytes in TPN: none Add MVI, trace elements to TPN resistant q6h SSI and adjust as needed NS IVMF at 60 ml/hr F/U in am  Lowella Bandy, PharmD 09/05/2018,2:13 PM

## 2018-09-05 NOTE — Progress Notes (Addendum)
[facility] Progress Note  1 Day Post-Op  Subjective: No acute events overnight. She is resting comfortably in bed this morning. She notes incisional soreness but denied any nausea or emesis. No flatus this morning. She does not that the last time she had significant PO intake was last Saturday.   Objective: Vital signs in last 24 hours: Temp:  [97 F (36.1 C)-98.7 F (37.1 C)] 98 F (36.7 C) (11/01 0605) Pulse Rate:  [66-78] 77 (11/01 0605) Resp:  [12-20] 16 (11/01 0605) BP: (91-170)/(51-76) 117/62 (11/01 0605) SpO2:  [89 %-100 %] 99 % (11/01 0822) Weight:  [93 kg] 93 kg (10/31 1455) Last BM Date: 08/31/18  Intake/Output from previous day: 10/31 0701 - 11/01 0700 In: 2905.6 [I.V.:2805.6; IV Piggyback:100] Out: 840 [Urine:780; Emesis/NG output:50; Blood:10] Intake/Output this shift: No intake/output data recorded.  PE: Gen:  Alert, NAD, pleasant Card:  Regular rate and rhythm, pedal pulses 2+ BL Pulm:  Normal effort, clear to auscultation bilaterally Abd: Soft, incisional tenderness, non-distended, midline laparotomy incision C/D/I without surrounding erythema or drainage Skin: warm and dry, no rashes  Psych: A&Ox3   Lab Results:  Recent Labs    09/03/18 0849 09/05/18 0814  WBC 7.2 12.3*  HGB 11.0* 10.9*  HCT 36.0 36.1  PLT 170 193   BMET Recent Labs    09/03/18 0849  NA 142  K 4.2  CL 102  CO2 33*  GLUCOSE 108*  BUN 19  CREATININE 0.59  CALCIUM 9.0   PT/INR No results for input(s): LABPROT, INR in the last 72 hours. CMP     Component Value Date/Time   NA 142 09/03/2018 0849   NA 149 (H) 03/04/2018 1235   K 4.2 09/03/2018 0849   CL 102 09/03/2018 0849   CO2 33 (H) 09/03/2018 0849   GLUCOSE 108 (H) 09/03/2018 0849   BUN 19 09/03/2018 0849   BUN 13 03/04/2018 1235   CREATININE 0.59 09/03/2018 0849   CALCIUM 9.0 09/03/2018 0849   PROT 7.5 09/01/2018 0312   PROT 5.9 (L) 03/04/2018 1235   ALBUMIN 4.0 09/01/2018 0312   ALBUMIN 3.7  03/04/2018 1235   AST 15 09/01/2018 0312   ALT 8 09/01/2018 0312   ALKPHOS 75 09/01/2018 0312   BILITOT 0.6 09/01/2018 0312   BILITOT <0.2 03/04/2018 1235   GFRNONAA >60 09/03/2018 0849   GFRAA >60 09/03/2018 0849   Lipase     Component Value Date/Time   LIPASE 22 09/01/2018 0312       Studies/Results: Dg Abd 1 View  Result Date: 09/04/2018 CLINICAL DATA:  Follow-up ileus EXAM: ABDOMEN - 1 VIEW COMPARISON:  09/02/2018 FINDINGS: Nasogastric catheter has been removed in the interval. Contrast material is noted within the colon from prior CT examination. Scattered large and small bowel gas is noted. A few mildly prominent loops of small bowel are noted in the left mid abdomen no definitive free air is seen. No bony abnormality is noted. IMPRESSION: Mildly prominent left mid abdominal small bowel loops. No other focal abnormality is noted. Electronically Signed   By: Alcide Clever M.D.   On: 09/04/2018 09:20    Anti-infectives: Anti-infectives (From admission, onward)   None       Assessment/Plan  Kristen Schroeder is a 77 y.o. female  who is 1 day s/p exploratory laparotomy with lysis of adhesions secondary to small bowel obstruction  - NPO, IVF, NGT - Will consult for PICC Line and TPN given the patient not having true PO intake  since last Saturday.  - Monitor abdominal exam and on going bowel function - Mild leukocytosis this morning, likely stress reaction from surgery.  - Pain control (minimize narcotics), anti-emetics - Mobilize - DVT Prophylaxis    LOS: 4 days    Lynden Oxford , PA-C [facility] 09/05/2018, 8:48 AM [phone] M-F: 7am - 4pm

## 2018-09-05 NOTE — Progress Notes (Signed)
Nutrition Follow Up Note   DOCUMENTATION CODES:   Obesity unspecified  INTERVENTION:   Clinimix 5/15 with electrolytes at 1ml/hr + 20% lipids @20ml /hr x 12 hrs/day (Goal rate 98ml/hr once labs stable)   Regimen at goal provides 1894kcal/day, 100g/day protein, volume    Add MVI daily   Add trace elements daily    Pt at moderate refeeding risk; recommend check P, K, and Mg daily for 3 days after TPN iniatition.    Daily weights   Change IVF to NaCl with no additives- keep total TPN + IVF rate @100ml /hr per surgery  NUTRITION DIAGNOSIS:   Inadequate oral intake related to acute illness as evidenced by NPO status.  GOAL:   Patient will meet greater than or equal to 90% of their needs  MONITOR:   Diet advancement, Labs, Weight trends, Skin, I & O's  REASON FOR ASSESSMENT:   Malnutrition Screening Tool    ASSESSMENT:   77 y.o. female with a history of incarcerated ventral hernia repair in June 2019 admitted with SBO   Pt s/p ex lap and lysis of adhesions 10/31. Plan for PICC line and TPN tonight. Pt likely at refeeding risk. Monitor electrolytes. Daily weights.   Medications reviewed and include: lovenox, insulin, pepcid, NaCl w/ 5% dextrose + KCl @100ml /hr  Labs reviewed: K 4.6 wnl, Mg 1.7 wnl Triglycerides 224(H)- 6/24 Wbc 12.3(H), Hgb 10.9(L), Hct 36.1 wnl  Diet Order:   Diet Order            Diet NPO time specified  Diet effective now             EDUCATION NEEDS:   Education needs have been addressed  Skin:  Skin Assessment: Reviewed RN Assessment  Last BM:  10/27  Height:   Ht Readings from Last 1 Encounters:  09/04/18 5\' 6"  (1.676 m)    Weight:   Wt Readings from Last 1 Encounters:  09/04/18 93 kg    Ideal Body Weight:  59 kg  BMI:  Body mass index is 33.09 kg/m.  Estimated Nutritional Needs:   Kcal:  1700-2000kcal/day   Protein:  93-103g/day   Fluid:  >1.5L/day   Betsey Holiday MS, RD, LDN Pager #-  [phone] Office#- [phone] After Hours Pager: [phone]

## 2018-09-05 NOTE — Evaluation (Signed)
Physical Therapy Evaluation Patient Details Name: Kristen Schroeder MRN: [id number] DOB: 24-Apr-1941 Today's Date: 09/05/2018   History of Present Illness   77 y.o. female s/p lapro with adhesion release secodary to small bowel obstruction.  She was here with incarcerated ventral hernia repair in June 2019, had a post-op obstruction episode early on as well.  She presented this episode with abdominal pain in the left side since 10/25, associated with nausea and vomiting.    Clinical Impression  Pt pleasant t/o the session and eager to get up, do some walking and show that she will be able to go home.  She showed good overall mobility in bed, needed only light VCs to get to standing and was able to ambulate ~75 ft with walker and slow but consistent gait.  She is not at her baseline and will require HHPT on d/c, but generally she did very well post-op day 1.      Follow Up Recommendations Home health PT    Equipment Recommendations  None recommended by PT    Recommendations for Other Services       Precautions / Restrictions Precautions Precautions: Fall Restrictions Weight Bearing Restrictions: No      Mobility  Bed Mobility Overal bed mobility: Modified Independent             General bed mobility comments: Pt able to get up to sitting w/o direct assist, need for   Transfers Overall transfer level: Modified independent Equipment used: Rolling walker (2 wheeled)             General transfer comment: minimal extra cuing for hand placement, forward weight shift and positioning; ultimatley able to slowly rise w/o assist  Ambulation/Gait Ambulation/Gait assistance: Supervision Gait Distance (Feet): 75 Feet Assistive device: Rolling walker (2 wheeled)       General Gait Details: Pt with slow but consistent and confident cadence.  She did need consistent cuing to stay inside the walker and maintain upright posture.  Stairs            Wheelchair Mobility     Modified Rankin (Stroke Patients Only)       Balance Overall balance assessment: Modified Independent                                           Pertinent Vitals/Pain Pain Assessment: 0-10 Pain Score: 3  Pain Location: abdomen    Home Living Family/patient expects to be discharged to:: Private residence Living Arrangements: Spouse/significant other Available Help at Discharge: Family Type of Home: House Home Access: Stairs to enter Entrance Stairs-Rails: None Entrance Stairs-Number of Steps: 1 Home Layout: One level Home Equipment: Environmental consultant - 4 wheels;Cane - single point;Walker - 2 wheels(uses [phone] out of home, FWW in home)      Prior Function Level of Independence: Independent with assistive device(s)         Comments: Mod indep with 4WRW for ADLs, household and limited community mobilization as appropriate; husband assists with ADLs as appropriate; denies fall history; home O2 at 3L.     Hand Dominance        Extremity/Trunk Assessment   Upper Extremity Assessment Upper Extremity Assessment: Overall WFL for tasks assessed    Lower Extremity Assessment Lower Extremity Assessment: Overall WFL for tasks assessed       Communication   Communication: No difficulties  Cognition Arousal/Alertness: Awake/alert Behavior  During Therapy: WFL for tasks assessed/performed Overall Cognitive Status: Within Functional Limits for tasks assessed                                        General Comments      Exercises     Assessment/Plan    PT Assessment Patient needs continued PT services  PT Problem List Decreased strength;Decreased range of motion;Decreased activity tolerance;Decreased balance;Decreased mobility;Decreased safety awareness;Decreased knowledge of use of DME;Pain       PT Treatment Interventions DME instruction;Gait training;Stair training;Functional mobility training;Therapeutic exercise;Therapeutic  activities;Patient/family education;Neuromuscular re-education;Balance training    PT Goals (Current goals can be found in the Care Plan section)  Acute Rehab PT Goals Patient Stated Goal: go home PT Goal Formulation: With patient Time For Goal Achievement: 09/19/18 Potential to Achieve Goals: Good    Frequency Min 2X/week   Barriers to discharge        Co-evaluation               AM-PAC PT "6 Clicks" Daily Activity  Outcome Measure Difficulty turning over in bed (including adjusting bedclothes, sheets and blankets)?: None Difficulty moving from lying on back to sitting on the side of the bed? : A Little Difficulty sitting down on and standing up from a chair with arms (e.g., wheelchair, bedside commode, etc,.)?: A Little Help needed moving to and from a bed to chair (including a wheelchair)?: None Help needed walking in hospital room?: None Help needed climbing 3-5 steps with a railing? : A Little 6 Click Score: 21    End of Session Equipment Utilized During Treatment: Gait belt;Oxygen(3 liters) Activity Tolerance: Patient tolerated treatment well Patient left: with chair alarm set;with call bell/phone within reach;with family/visitor present;with nursing/sitter in room Nurse Communication: Mobility status PT Visit Diagnosis: Muscle weakness (generalized) (M62.81);Difficulty in walking, not elsewhere classified (R26.2)    Time: 1610-9604 PT Time Calculation (min) (ACUTE ONLY): 20 min   Charges:   PT Evaluation $PT Eval Low Complexity: 1 Low          Malachi Pro, DPT 09/05/2018, 4:28 PM

## 2018-09-05 NOTE — Progress Notes (Signed)
Peripherally Inserted Central Catheter/Midline Placement  The IV Nurse has discussed with the patient and/or persons authorized to consent for the patient, the purpose of this procedure and the potential benefits and risks involved with this procedure.  The benefits include less needle sticks, lab draws from the catheter, and the patient may be discharged home with the catheter. Risks include, but not limited to, infection, bleeding, blood clot (thrombus formation), and puncture of an artery; nerve damage and irregular heartbeat and possibility to perform a PICC exchange if needed/ordered by physician.  Alternatives to this procedure were also discussed.  Bard Power PICC patient education guide, fact sheet on infection prevention and patient information card has been provided to patient /or left at bedside.    PICC/Midline Placement Documentation  PICC Double Lumen 09/05/18 PICC Basilic 38 cm 0 cm (Active)  Indication for Insertion or Continuance of Line Administration of hyperosmolar/irritating solutions (i.e. TPN, Vancomycin, etc.) 09/05/2018 11:48 AM  Exposed Catheter (cm) 0 cm 09/05/2018 11:48 AM  Site Assessment Clean;Dry;Intact 09/05/2018 11:48 AM  Lumen #1 Status Flushed;Blood return noted;Saline locked 09/05/2018 11:48 AM  Lumen #2 Status Flushed;Blood return noted;Saline locked 09/05/2018 11:48 AM  Dressing Type Transparent 09/05/2018 11:48 AM  Dressing Status Clean;Dry;Intact;Antimicrobial disc in place 09/05/2018 11:48 AM  Dressing Intervention New dressing 09/05/2018 11:48 AM  Dressing Change Due 09/12/18 09/05/2018 11:48 AM       Audrie Gallus 09/05/2018, 11:52 AM

## 2018-09-06 LAB — BASIC METABOLIC PANEL
Anion gap: 4 — ABNORMAL LOW (ref 5–15)
BUN: 11 mg/dL (ref 8–23)
CO2: 30 mmol/L (ref 22–32)
Calcium: 7.8 mg/dL — ABNORMAL LOW (ref 8.9–10.3)
Chloride: 108 mmol/L (ref 98–111)
Creatinine, Ser: 0.59 mg/dL (ref 0.44–1.00)
GFR calc Af Amer: >60 mL/min (ref >60)
GFR calc non Af Amer: >60 mL/min (ref >60)
Glucose, Bld: 117 mg/dL — ABNORMAL HIGH (ref 70–99)
Potassium: 4 mmol/L (ref 3.5–5.1)
Sodium: 142 mmol/L (ref 135–145)

## 2018-09-06 LAB — GLUCOSE, CAPILLARY
Glucose-Capillary: 109 mg/dL — ABNORMAL HIGH (ref 70–99)
Glucose-Capillary: 125 mg/dL — ABNORMAL HIGH (ref 70–99)
Glucose-Capillary: 87 mg/dL (ref 70–99)

## 2018-09-06 LAB — TRIGLYCERIDES: Triglycerides: 193 mg/dL — ABNORMAL HIGH (ref <150)

## 2018-09-06 LAB — MAGNESIUM: Magnesium: 1.9 mg/dL (ref 1.7–2.4)

## 2018-09-06 LAB — PHOSPHORUS: Phosphorus: 2.6 mg/dL (ref 2.5–4.6)

## 2018-09-06 LAB — PREALBUMIN: Prealbumin: 11.1 mg/dL — ABNORMAL LOW (ref 18–38)

## 2018-09-06 MED ORDER — TRAMADOL HCL 50 MG PO TABS
50.0000 mg | ORAL_TABLET | Freq: Four times a day (QID) | ORAL | Status: DC | PRN
  Administered 2018-09-07: 50 mg via ORAL
  Filled 2018-09-06: qty 1

## 2018-09-06 MED ORDER — OXYCODONE-ACETAMINOPHEN 5-325 MG PO TABS: 1.0000 | ORAL_TABLET | Freq: Four times a day (QID) | ORAL | Status: DC | PRN

## 2018-09-06 MED ORDER — TRACE MINERALS CR-CU-MN-SE-ZN 10-1000-500-60 MCG/ML IV SOLN
INTRAVENOUS | Status: DC
  Filled 2018-09-06 (×3): qty 960

## 2018-09-06 MED ORDER — FAT EMULSION PLANT BASED 20 % IV EMUL
250.0000 mL | INTRAVENOUS | Status: AC
  Administered 2018-09-06: 250 mL via INTRAVENOUS
  Filled 2018-09-06: qty 250

## 2018-09-06 MED ORDER — TRACE MINERALS CR-CU-MN-SE-ZN 10-1000-500-60 MCG/ML IV SOLN
INTRAVENOUS | Status: AC
  Administered 2018-09-06: 18:00:00 via INTRAVENOUS
  Filled 2018-09-06: qty 1992

## 2018-09-06 NOTE — Progress Notes (Signed)
PT Cancellation Note  Patient Details Name: Kristen Schroeder MRN: [id number] DOB: [birth year]   Cancelled Treatment:    Reason Eval/Treat Not Completed: Other (comment)   Pt in recliner, stated she was up and walked unit with nursing today.  Felt comfortable with mobility.  Will continue as appropriate.   Danielle Dess 09/06/2018, 1:29 PM

## 2018-09-06 NOTE — Progress Notes (Signed)
Subjective:  CC:  Kristen Schroeder is a 77 y.o. female  Hospital stay day 5, 2 Days Post-Op ex-lap, LOA for SBO  HPI: No issues overnight.  Pain tolerable.  Ambulating halls, no flatus or BM reported.  ROS:  A 5 point review of systems was performed and pertinent positives and negatives noted in HPI.   Objective:      Temp:  [97.3 F (36.3 C)-99.1 F (37.3 C)] 99.1 F (37.3 C) (11/02 0501) Pulse Rate:  [73-87] 87 (11/02 0501) Resp:  [18] 18 (11/02 0501) BP: (103-144)/(35-82) 144/82 (11/02 0501) SpO2:  [97 %-99 %] 97 % (11/02 0858) Weight:  [96.7 kg] 96.7 kg (11/02 0501)     Height: 5\' 6"  (167.6 cm) Weight: 96.7 kg BMI (Calculated): 34.43   Intake/Output this shift:  Total I/O In: 780.5 [I.V.:680.7; IV Piggyback:99.8] Out: 400 [Urine:400]  830 NG     Constitutional :  alert, cooperative, appears stated age and no distress  Respiratory:  clear to auscultation bilaterally  Cardiovascular:  regular rate and rhythm  Gastrointestinal: abnormal findings:  soft, no bowel sounds, tender to palpation over incision, which is c/d/i.   Skin: Cool and moist.   Psychiatric: Normal affect, non-agitated, not confused       LABS:  CMP Latest Ref Rng & Units 09/06/2018 09/05/2018 09/03/2018  Glucose 70 - 99 mg/dL 161(W) 96 960(A)  BUN 8 - 23 mg/dL 11 10 19   Creatinine 0.44 - 1.00 mg/dL 5.40 9.81 1.91  Sodium 135 - 145 mmol/L 142 140 142  Potassium 3.5 - 5.1 mmol/L 4.0 4.6 4.2  Chloride 98 - 111 mmol/L 108 105 102  CO2 22 - 32 mmol/L 30 29 33(H)  Calcium 8.9 - 10.3 mg/dL 7.8(L) 7.9(L) 9.0  Total Protein 6.5 - 8.1 g/dL - - -  Total Bilirubin 0.3 - 1.2 mg/dL - - -  Alkaline Phos 38 - 126 U/L - - -  AST 15 - 41 U/L - - -  ALT 0 - 44 U/L - - -   CBC Latest Ref Rng & Units 09/05/2018 09/03/2018 09/02/2018  WBC 4.0 - 10.5 K/uL 12.3(H) 7.2 7.5  Hemoglobin 12.0 - 15.0 g/dL 10.9(L) 11.0(L) 10.3(L)  Hematocrit 36.0 - 46.0 % 36.1 36.0 34.8(L)  Platelets 150 - 400 K/uL 193 170 167     RADS: n/a Assessment:   2 Days Post-Op ex-lap, LOA for SBO  Cont NPO, NG decompression, await return of bowel function.  Start gum, hard candy, oral pain meds.  Encourage ambulation.  TPN due to extended NPO time.  Will continue to monitor.  Leukocytosis- monitor

## 2018-09-06 NOTE — Consult Note (Signed)
PHARMACY - ADULT TOTAL PARENTERAL NUTRITION CONSULT NOTE   Patient Measurements: Height: 5\' 6"  (167.6 cm) Weight: 213 lb 3 oz (96.7 kg) IBW/kg (Calculated) : 59.3   Body mass index is 34.41 kg/m.  Assessment: 77 y.o.female who is 2 day s/p exploratory laparotomy with lysis of adhesions secondary to SBO. Her last po intake was 7 days ago  GI: famotidine Endo: SSI Insulin requirements in the past 24 hours: Lytes: BMP not available. Mg/phos fine.  Renal:  Pulm: Cards:  Hepatobil: Neuro: ID: WBC wnl, afebrile  TPN Access: 09/05/18 TPN start date: 09/05/18 Nutritional Goals (per RD recommendation on 09/05/18): KCal: 1894 Protein: 100g Fluid:  Goal TPN rate is 83 ml/hr (provides 90% of needs)  Current Nutrition: none  Plan:  Advance to goal today E 5/15 TPN at 83 mL/hr + lipid emulsion 20% at 75ml/hr x 12 hours This TPN provides 99.8 g of protein, 299.5 g of dextrose, and 36 g of lipids which provides 912 kCals per day, meeting patient needs Electrolytes in TPN: Clinimix E Add MVI, trace elements to TPN resistant q6h SSI and adjust as needed NS IVMF reduced to 17 ml/hr F/U in am labs  [employer], Pharm.D., BCPS Clinical Pharmacist 09/06/2018,9:32 AM

## 2018-09-07 LAB — GLUCOSE, CAPILLARY
Glucose-Capillary: 110 mg/dL — ABNORMAL HIGH (ref 70–99)
Glucose-Capillary: 113 mg/dL — ABNORMAL HIGH (ref 70–99)
Glucose-Capillary: 117 mg/dL — ABNORMAL HIGH (ref 70–99)
Glucose-Capillary: 122 mg/dL — ABNORMAL HIGH (ref 70–99)

## 2018-09-07 LAB — PHOSPHORUS: Phosphorus: 2.8 mg/dL (ref 2.5–4.6)

## 2018-09-07 LAB — MAGNESIUM: Magnesium: 1.9 mg/dL (ref 1.7–2.4)

## 2018-09-07 LAB — BASIC METABOLIC PANEL
Anion gap: 5 (ref 5–15)
BUN: 14 mg/dL (ref 8–23)
CO2: 32 mmol/L (ref 22–32)
Calcium: 8.3 mg/dL — ABNORMAL LOW (ref 8.9–10.3)
Chloride: 105 mmol/L (ref 98–111)
Creatinine, Ser: 0.57 mg/dL (ref 0.44–1.00)
GFR calc Af Amer: >60 mL/min (ref >60)
GFR calc non Af Amer: >60 mL/min (ref >60)
Glucose, Bld: 126 mg/dL — ABNORMAL HIGH (ref 70–99)
Potassium: 4 mmol/L (ref 3.5–5.1)
Sodium: 142 mmol/L (ref 135–145)

## 2018-09-07 MED ORDER — TRACE MINERALS CR-CU-MN-SE-ZN 10-1000-500-60 MCG/ML IV SOLN
INTRAVENOUS | Status: AC
  Administered 2018-09-07: 17:00:00 via INTRAVENOUS
  Filled 2018-09-07: qty 1992

## 2018-09-07 MED ORDER — FAT EMULSION PLANT BASED 20 % IV EMUL
250.0000 mL | INTRAVENOUS | Status: AC
  Administered 2018-09-07: 250 mL via INTRAVENOUS
  Filled 2018-09-07: qty 250

## 2018-09-07 NOTE — Progress Notes (Signed)
Subjective:  CC:  Kristen Schroeder is a 77 y.o. female  Hospital stay day 6, 3 Days Post-Op ex-lap, LOA for SBO  HPI: No issues overnight.  Pain tolerable.  Ambulating halls, still no flatus or BM reported.  ROS:  A 5 point review of systems was performed and pertinent positives and negatives noted in HPI.   Objective:      Temp:  [97.6 F (36.4 C)-98.4 F (36.9 C)] 97.9 F (36.6 C) (11/03 1300) Pulse Rate:  [83-84] 83 (11/03 1300) Resp:  [18-20] 18 (11/03 1300) BP: (153-162)/(68-73) 153/73 (11/03 1300) SpO2:  [98 %-100 %] 98 % (11/03 1300) FiO2 (%):  [32 %] 32 % (11/02 2126) Weight:  [92.1 kg] 92.1 kg (11/03 0500)     Height: 5\' 6"  (167.6 cm) Weight: 92.1 kg BMI (Calculated): 32.78   Intake/Output this shift:  Total I/O In: 1717 [I.V.:1666.9; IV Piggyback:50] Out: 300 [Urine:300]      Constitutional :  alert, cooperative, appears stated age and no distress  Respiratory:  clear to auscultation bilaterally  Cardiovascular:  regular rate and rhythm  Gastrointestinal: abnormal findings:  soft, no bowel sounds, tender to palpation over incision, which is c/d/i.   Skin: Cool and moist.   Psychiatric: Normal affect, non-agitated, not confused       LABS:  CMP Latest Ref Rng & Units 09/07/2018 09/06/2018 09/05/2018  Glucose 70 - 99 mg/dL 213(Y) 865(H) 96  BUN 8 - 23 mg/dL 14 11 10   Creatinine 0.44 - 1.00 mg/dL 8.46 9.62 9.52  Sodium 135 - 145 mmol/L 142 142 140  Potassium 3.5 - 5.1 mmol/L 4.0 4.0 4.6  Chloride 98 - 111 mmol/L 105 108 105  CO2 22 - 32 mmol/L 32 30 29  Calcium 8.9 - 10.3 mg/dL 8.3(L) 7.8(L) 7.9(L)  Total Protein 6.5 - 8.1 g/dL - - -  Total Bilirubin 0.3 - 1.2 mg/dL - - -  Alkaline Phos 38 - 126 U/L - - -  AST 15 - 41 U/L - - -  ALT 0 - 44 U/L - - -   CBC Latest Ref Rng & Units 09/05/2018 09/03/2018 09/02/2018  WBC 4.0 - 10.5 K/uL 12.3(H) 7.2 7.5  Hemoglobin 12.0 - 15.0 g/dL 10.9(L) 11.0(L) 10.3(L)  Hematocrit 36.0 - 46.0 % 36.1 36.0 34.8(L)  Platelets  150 - 400 K/uL 193 170 167    RADS: n/a Assessment:   3 Days Post-Op ex-lap, LOA for SBO  Cont NPO, NG decompression, await return of bowel function.  Cont gum, hard candy, oral pain meds.  Encourage ambulation.  TPN due to extended NPO time.  Will continue to monitor.  Leukocytosis- monitor. Recheck pending monday

## 2018-09-07 NOTE — Consult Note (Signed)
PHARMACY - ADULT TOTAL PARENTERAL NUTRITION CONSULT NOTE   Patient Measurements: Height: 5\' 6"  (167.6 cm) Weight: 203 lb (92.1 kg) IBW/kg (Calculated) : 59.3   Body mass index is 32.77 kg/m.  Assessment: 77 y.o.female who is 2 day s/p exploratory laparotomy with lysis of adhesions secondary to SBO. Her last po intake was 7 days prior to TPN start   GI: famotidine Endo: SSI Insulin requirements in the past 24 hours:6units (BG: 87-126) Lytes: No additional replacement needed: K: 4.0, Mg:1.9, Phos: 2.8  Renal:  Pulm: Cards:  Hepatobil: Neuro: ID: WBC wnl, afebrile  TPN Access: 09/05/18 TPN start date: 09/05/18 Nutritional Goals (per RD recommendation on 09/05/18): KCal: 1894 Protein: 100g Fluid:  Goal TPN rate is 83 ml/hr (provides 90% of needs)  Current Nutrition: none  Plan:  Continue at goal rate of Clinimix E 5/15 TPN at 83 mL/hr + lipid emulsion 20% at 40ml/hr x 12 hours  This TPN provides 99.8 g of protein, 299.5 g of dextrose, and 36 g of lipids which provides 912 kCals per day, meeting patient needs  Electrolytes in TPN: Clinimix E Add MVI, trace elements to TPN Resistant q6h SSI and adjust as needed NS IVMF reduced to 17 ml/hr F/U in am labs  [employer], Pharm.D., BCPS Clinical Pharmacist 09/07/2018,11:41 AM

## 2018-09-08 LAB — MAGNESIUM: Magnesium: 1.9 mg/dL (ref 1.7–2.4)

## 2018-09-08 LAB — GLUCOSE, CAPILLARY
Glucose-Capillary: 114 mg/dL — ABNORMAL HIGH (ref 70–99)
Glucose-Capillary: 114 mg/dL — ABNORMAL HIGH (ref 70–99)
Glucose-Capillary: 116 mg/dL — ABNORMAL HIGH (ref 70–99)
Glucose-Capillary: 124 mg/dL — ABNORMAL HIGH (ref 70–99)
Glucose-Capillary: 133 mg/dL — ABNORMAL HIGH (ref 70–99)

## 2018-09-08 LAB — DIFFERENTIAL
Abs Immature Granulocytes: 0.04 10*3/uL (ref 0.00–0.07)
Basophils Absolute: 0 10*3/uL (ref 0.0–0.1)
Basophils Relative: 0 %
Eosinophils Absolute: 0.3 10*3/uL (ref 0.0–0.5)
Eosinophils Relative: 4 %
Immature Granulocytes: 1 %
Lymphocytes Relative: 16 %
Lymphs Abs: 1 10*3/uL (ref 0.7–4.0)
Monocytes Absolute: 0.7 10*3/uL (ref 0.1–1.0)
Monocytes Relative: 10 %
Neutro Abs: 4.5 10*3/uL (ref 1.7–7.7)
Neutrophils Relative %: 69 %

## 2018-09-08 LAB — COMPREHENSIVE METABOLIC PANEL
ALT: 8 U/L (ref 0–44)
AST: 18 U/L (ref 15–41)
Albumin: 2.8 g/dL — ABNORMAL LOW (ref 3.5–5.0)
Alkaline Phosphatase: 52 U/L (ref 38–126)
Anion gap: 6 (ref 5–15)
BUN: 16 mg/dL (ref 8–23)
CO2: 32 mmol/L (ref 22–32)
Calcium: 8.3 mg/dL — ABNORMAL LOW (ref 8.9–10.3)
Chloride: 103 mmol/L (ref 98–111)
Creatinine, Ser: 0.53 mg/dL (ref 0.44–1.00)
GFR calc Af Amer: >60 mL/min (ref >60)
GFR calc non Af Amer: >60 mL/min (ref >60)
Glucose, Bld: 120 mg/dL — ABNORMAL HIGH (ref 70–99)
Potassium: 3.8 mmol/L (ref 3.5–5.1)
Sodium: 141 mmol/L (ref 135–145)
Total Bilirubin: 0.4 mg/dL (ref 0.3–1.2)
Total Protein: 5.9 g/dL — ABNORMAL LOW (ref 6.5–8.1)

## 2018-09-08 LAB — TRIGLYCERIDES: Triglycerides: 210 mg/dL — ABNORMAL HIGH (ref <150)

## 2018-09-08 LAB — CBC
HCT: 31.7 % — ABNORMAL LOW (ref 36.0–46.0)
Hemoglobin: 9.5 g/dL — ABNORMAL LOW (ref 12.0–15.0)
MCH: 28.9 pg (ref 26.0–34.0)
MCHC: 30 g/dL (ref 30.0–36.0)
MCV: 96.4 fL (ref 80.0–100.0)
Platelets: 182 10*3/uL (ref 150–400)
RBC: 3.29 MIL/uL — ABNORMAL LOW (ref 3.87–5.11)
RDW: 13 % (ref 11.5–15.5)
WBC: 6.5 10*3/uL (ref 4.0–10.5)
nRBC: 0 % (ref 0.0–0.2)

## 2018-09-08 LAB — PREALBUMIN: Prealbumin: 11 mg/dL — ABNORMAL LOW (ref 18–38)

## 2018-09-08 LAB — PHOSPHORUS: Phosphorus: 3.1 mg/dL (ref 2.5–4.6)

## 2018-09-08 MED ORDER — FAT EMULSION PLANT BASED 20 % IV EMUL
250.0000 mL | INTRAVENOUS | Status: AC
  Administered 2018-09-08: 250 mL via INTRAVENOUS
  Filled 2018-09-08: qty 250

## 2018-09-08 MED ORDER — TRACE MINERALS CR-CU-MN-SE-ZN 10-1000-500-60 MCG/ML IV SOLN
INTRAVENOUS | Status: AC
  Administered 2018-09-08: 19:00:00 via INTRAVENOUS
  Filled 2018-09-08: qty 1992

## 2018-09-08 NOTE — Care Management (Signed)
Patient POD#4 s/p exlap and lysis of adhesions for bowel obstruction.  Patient lives at home with husband.  Hx SBO in the past.  PCP Sullivan Lone.  Pharmacy [facility] in [place].  PT has assessed patient and recommends home health PT.  Patient is agreeable to home health services.  Has used [facility] in the past, and would like to use them again. Heads up referral made to [facility] with [facility].  Patient has rollator, RW, and cane in the home.  RNCM following for any additional needs

## 2018-09-08 NOTE — Progress Notes (Signed)
[facility] Progress Note  4 Days Post-Op  Subjective: No acute events overnight. She reports improvement in her abdominal soreness and is without nausea or emesis. Mobilizing well. She endorses +flatus but no BM. No fever, chills.   Of note, she is reporting hallucinations at night which she described as "feeling like the TV and pictures on the wall are melting."   Objective: Vital signs in last 24 hours: Temp:  [97.9 F (36.6 C)-98.4 F (36.9 C)] 98.4 F (36.9 C) (11/04 0420) Pulse Rate:  [67-83] 77 (11/04 0420) Resp:  [18] 18 (11/04 0420) BP: (120-153)/(56-73) 140/61 (11/04 0420) SpO2:  [98 %-100 %] 99 % (11/04 0420) Weight:  [93 kg] 93 kg (11/04 0459) Last BM Date: 08/31/18  Intake/Output from previous day: 11/03 0701 - 11/04 0700 In: 3039.1 [I.V.:2939.1; IV Piggyback:100] Out: 1750 [Urine:1300; Emesis/NG output:450] Intake/Output this shift: No intake/output data recorded.  PE: Gen:  Alert, NAD, pleasant HEENT: NGT in place Pulm:  Normal effort Abd: Soft, incisional tenderness, non-distended, midline laparotomy incision C/D/I without surrounding erythema or drainage Skin: warm and dry, no rashes  Psych: A&Ox3   Lab Results:  Recent Labs    09/08/18 0501  WBC 6.5  HGB 9.5*  HCT 31.7*  PLT 182   BMET Recent Labs    09/07/18 0621 09/08/18 0501  NA 142 141  K 4.0 3.8  CL 105 103  CO2 32 32  GLUCOSE 126* 120*  BUN 14 16  CREATININE 0.57 0.53  CALCIUM 8.3* 8.3*   PT/INR No results for input(s): LABPROT, INR in the last 72 hours. CMP     Component Value Date/Time   NA 141 09/08/2018 0501   NA 149 (H) 03/04/2018 1235   K 3.8 09/08/2018 0501   CL 103 09/08/2018 0501   CO2 32 09/08/2018 0501   GLUCOSE 120 (H) 09/08/2018 0501   BUN 16 09/08/2018 0501   BUN 13 03/04/2018 1235   CREATININE 0.53 09/08/2018 0501   CALCIUM 8.3 (L) 09/08/2018 0501   PROT 5.9 (L) 09/08/2018 0501   PROT 5.9 (L) 03/04/2018 1235   ALBUMIN 2.8 (L)  09/08/2018 0501   ALBUMIN 3.7 03/04/2018 1235   AST 18 09/08/2018 0501   ALT 8 09/08/2018 0501   ALKPHOS 52 09/08/2018 0501   BILITOT 0.4 09/08/2018 0501   BILITOT <0.2 03/04/2018 1235   GFRNONAA >60 09/08/2018 0501   GFRAA >60 09/08/2018 0501   Lipase     Component Value Date/Time   LIPASE 22 09/01/2018 0312       Studies/Results: No results found.  Anti-infectives: Anti-infectives (From admission, onward)   None       Assessment/Plan   Mckensie L Haithis a77 y.o.female who is 4 days s/p exploratory laparotomy with lysis of adhesions secondary to small bowel obstruction  - Given reports of flatus, will trial with clamped NGT for 4 hours. If residuals after 4 hours <150 ccs will remove NGT - NPO for now, will trial on clears if passed NGT trial today.  - Continue TPN, can begin to wean when diet initiated - Continue IVF - Pain control (minimize narcotics) PRN - Mobilize - DVT Prophylaxis     LOS: 7 days    Lynden Oxford , PA-C [facility] 09/08/2018, 8:09 AM [phone] M-F: 7am - 4pm

## 2018-09-08 NOTE — Consult Note (Signed)
PHARMACY - ADULT TOTAL PARENTERAL NUTRITION CONSULT NOTE   Patient Measurements: Height: 5\' 6"  (167.6 cm) Weight: 205 lb 0.4 oz (93 kg) IBW/kg (Calculated) : 59.3   Body mass index is 33.09 kg/m.  Assessment: 77 y.o.female who is 2 day s/p exploratory laparotomy with lysis of adhesions secondary to SBO. Her last po intake was 7 days prior to TPN start   GI: famotidine Endo: SSI Insulin requirements in the past 24 hours: 1 units (BG: 113-133) Lytes: No additional replacement needed: K: 3.8, Mg:1.9, Phos: 3.1  Renal:  Pulm: Cards:  Hepatobil: Neuro: ID: WBC wnl, afebrile  TPN Access: 09/05/18 TPN start date: 09/05/18 Nutritional Goals (per RD recommendation on 09/05/18): KCal: 1894 Protein: 100g Fluid:  Goal TPN rate is 83 ml/hr (provides 90% of needs)  Current Nutrition: none  Plan:  Continue at goal rate of Clinimix E 5/15 TPN at 83 mL/hr + lipid emulsion 20% at 51ml/hr x 12 hours  This TPN provides 99.8 g of protein, 299.5 g of dextrose, and 36 g of lipids which provides 912 kCals per day, meeting patient needs  Electrolytes in TPN: Clinimix E Add MVI, trace elements to TPN Resistant q6h SSI and adjust as needed NS IVMF reduced to 17 ml/hr F/U with Thurs 11/07 am labs.  Orinda Kenner, PharmD Clinical Pharmacist 09/08/2018,10:47 AM

## 2018-09-08 NOTE — Care Management Important Message (Signed)
Copy of signed IM left with patient in room.  

## 2018-09-09 LAB — GLUCOSE, CAPILLARY
Glucose-Capillary: 104 mg/dL — ABNORMAL HIGH (ref 70–99)
Glucose-Capillary: 119 mg/dL — ABNORMAL HIGH (ref 70–99)
Glucose-Capillary: 126 mg/dL — ABNORMAL HIGH (ref 70–99)
Glucose-Capillary: 126 mg/dL — ABNORMAL HIGH (ref 70–99)
Glucose-Capillary: 97 mg/dL (ref 70–99)

## 2018-09-09 MED ORDER — LISINOPRIL 20 MG PO TABS
20.0000 mg | ORAL_TABLET | Freq: Every day | ORAL | Status: DC
  Administered 2018-09-09 – 2018-09-10 (×2): 20 mg via ORAL
  Filled 2018-09-09 (×2): qty 1

## 2018-09-09 MED ORDER — PRAVASTATIN SODIUM 20 MG PO TABS
10.0000 mg | ORAL_TABLET | Freq: Every day | ORAL | Status: DC
  Administered 2018-09-09 – 2018-09-10 (×2): 10 mg via ORAL
  Filled 2018-09-09 (×2): qty 1

## 2018-09-09 MED ORDER — PANTOPRAZOLE SODIUM 40 MG PO TBEC
40.0000 mg | DELAYED_RELEASE_TABLET | Freq: Every day | ORAL | Status: DC
  Administered 2018-09-09 – 2018-09-10 (×2): 40 mg via ORAL
  Filled 2018-09-09 (×2): qty 1

## 2018-09-09 MED ORDER — ENSURE ENLIVE PO LIQD
237.0000 mL | Freq: Two times a day (BID) | ORAL | Status: DC
  Administered 2018-09-09 – 2018-09-10 (×2): 237 mL via ORAL

## 2018-09-09 MED ORDER — GABAPENTIN 300 MG PO CAPS
300.0000 mg | ORAL_CAPSULE | Freq: Three times a day (TID) | ORAL | Status: DC
  Administered 2018-09-09 – 2018-09-10 (×5): 300 mg via ORAL
  Filled 2018-09-09 (×5): qty 1

## 2018-09-09 MED ORDER — LORAZEPAM 0.5 MG PO TABS: 0.5000 mg | ORAL_TABLET | Freq: Two times a day (BID) | ORAL | Status: DC | PRN

## 2018-09-09 MED ORDER — TRACE MINERALS CR-CU-MN-SE-ZN 10-1000-500-60 MCG/ML IV SOLN
INTRAVENOUS | Status: AC
  Administered 2018-09-09: 18:00:00 via INTRAVENOUS
  Filled 2018-09-09: qty 1992

## 2018-09-09 MED ORDER — LEVOTHYROXINE SODIUM 50 MCG PO TABS
125.0000 ug | ORAL_TABLET | ORAL | Status: DC
  Administered 2018-09-10 – 2018-09-11 (×2): 125 ug via ORAL
  Filled 2018-09-09 (×2): qty 1

## 2018-09-09 MED ORDER — BOOST / RESOURCE BREEZE PO LIQD CUSTOM
1.0000 | Freq: Three times a day (TID) | ORAL | Status: DC
  Administered 2018-09-09: 1 via ORAL

## 2018-09-09 MED ORDER — FAT EMULSION PLANT BASED 20 % IV EMUL
250.0000 mL | INTRAVENOUS | Status: AC
  Administered 2018-09-09: 250 mL via INTRAVENOUS
  Filled 2018-09-09: qty 250

## 2018-09-09 MED ORDER — PAROXETINE HCL 10 MG PO TABS
10.0000 mg | ORAL_TABLET | Freq: Every day | ORAL | Status: DC
  Administered 2018-09-09 – 2018-09-10 (×2): 10 mg via ORAL
  Filled 2018-09-09 (×3): qty 1

## 2018-09-09 NOTE — Progress Notes (Signed)
Nutrition Follow Up Note   DOCUMENTATION CODES:   Obesity unspecified  INTERVENTION:   Continue Clinimix 5/15 with electrolytes at 83 ml/hr- Plan to decrease to half rate of 73m/hr on 11/6 if patient tolerating full liquid diet.   Continue 20% lipids '@20ml' /hr x 12 hrs/day    Cotninue MVI and trace elements daily   Boost Breeze po TID, each supplement provides 250 kcal and 9 grams of protein   NUTRITION DIAGNOSIS:   Inadequate oral intake related to acute illness as evidenced by NPO status. -progressing   GOAL:   Patient will meet greater than or equal to 90% of their needs -met with TPN  MONITOR:   PO intake, Supplement acceptance, Diet advancement, Labs, Weight trends, Skin, I & O's, Other (Comment)(TPN)  ASSESSMENT:   77y.o. female with a history of incarcerated ventral hernia repair in June 2019 admitted with SBO   Pt s/p ex lap and lysis of adhesions 10/31.   Pt continues on TPN; tolerating well. Pt advanced to clear liquid diet today and started on Boost Breeze. NGT removed. Per chart, pt is weight stable. Spoke to PA, plan to decrease TPN to 1/2 goal rate tomorrow if patient able to tolerate full liquid diet.    Medications reviewed and include: lovenox, insulin, NaCl '@17ml' /hr, pepcid  Labs reviewed: K 3.8 wnl, P 3.1 wnl, Mg 1.7 wnl Prealbumin 11.0(L) Triglycerides 210(H)- 11/4 Hgb 9.5(L), Hct 31.7 wnl cbgs- 133, 114, 114, 126, 119 x 24 hrs  Diet Order:   Diet Order            Diet clear liquid Room service appropriate? Yes; Fluid consistency: Thin  Diet effective now             EDUCATION NEEDS:   Education needs have been addressed  Skin:  Skin Assessment: Reviewed RN Assessment  Last BM:  10/27  Height:   Ht Readings from Last 1 Encounters:  09/04/18 '5\' 6"'  (1.676 m)    Weight:   Wt Readings from Last 1 Encounters:  09/09/18 93.5 kg    Ideal Body Weight:  59 kg  BMI:  Body mass index is 33.27 kg/m.  Estimated Nutritional Needs:    Kcal:  1700-2000kcal/day   Protein:  93-103g/day   Fluid:  >1.5L/day   CKoleen DistanceMS, RD, LDN Pager #- [phone]#- [phone] Hours Pager: [phone]

## 2018-09-09 NOTE — Consult Note (Signed)
PHARMACY - ADULT TOTAL PARENTERAL NUTRITION CONSULT NOTE   Patient Measurements: Height: 5\' 6"  (167.6 cm) Weight: 206 lb 2.1 oz (93.5 kg) IBW/kg (Calculated) : 59.3   Body mass index is 33.27 kg/m.  Assessment: 77 y.o.female who is 5 day s/p exploratory laparotomy with lysis of adhesions secondary to SBO. Her last po intake was 7 days prior to TPN start   GI: famotidine Endo: SSI Insulin requirements in the past 24 hours: 1 units (BG: 113-119) Lytes: No additional replacement needed: K: 3.8, Mg:1.9, Phos: 3.1  Renal:  Pulm: Cards:  Hepatobil: Neuro: ID: WBC wnl, afebrile  TPN Access: 09/05/18 TPN start date: 09/05/18 Nutritional Goals (per RD recommendation on 09/05/18): KCal: 1894 Protein: 100g Fluid:  Goal TPN rate is 83 ml/hr (provides 90% of needs)  Current Nutrition: none  Plan:  Continue at goal rate of Clinimix E 5/15 TPN at 83 mL/hr + lipid emulsion 20% at 30ml/hr x 12 hours  This TPN provides 99.8 g of protein, 299.5 g of dextrose, and 36 g of lipids which provides 912 kCals per day, meeting patient needs  Electrolytes in TPN: Clinimix E Add MVI, trace elements to TPN Resistant q6h SSI and adjust as needed NS IVMF reduced to 17 ml/hr F/U with Thurs 11/07 am labs.  Orinda Kenner, PharmD Clinical Pharmacist 09/09/2018,10:28 AM

## 2018-09-09 NOTE — Progress Notes (Signed)
[facility] Progress Note  5 Days Post-Op  Subjective: NGT removed around shift change yesterday otherwise no acute events overnight. She noted improvement in her abdominal soreness. She has been tolerating sips and ice chips without nausea or emesis and feels ready for clear liquids. No fevers or chills. Mobilizing well.   Objective: Vital signs in last 24 hours: Temp:  [97.6 F (36.4 C)-98.3 F (36.8 C)] 97.6 F (36.4 C) (11/05 0704) Pulse Rate:  [66-78] 66 (11/05 0704) Resp:  [18] 18 (11/05 0704) BP: (134-140)/(56-69) 140/56 (11/05 0704) SpO2:  [99 %-100 %] 100 % (11/05 0704) FiO2 (%):  [32 %] 32 % (11/04 2115) Weight:  [93.5 kg] 93.5 kg (11/05 0500) Last BM Date: 08/31/18  Intake/Output from previous day: 11/04 0701 - 11/05 0700 In: 1745.8 [I.V.:1645.8; IV Piggyback:100] Out: 400 [Urine:400] Intake/Output this shift: No intake/output data recorded.  PE: Gen: Alert, NAD, pleasant Pulm: Normal effort Abd: Soft,non-tender, non-distended,midline laparotomyincision C/D/Iwithout surrounding erythema or drainage Skin: warm and dry, no rashes  Psych: A&Ox3   Lab Results:  Recent Labs    09/08/18 0501  WBC 6.5  HGB 9.5*  HCT 31.7*  PLT 182   BMET Recent Labs    09/07/18 0621 09/08/18 0501  NA 142 141  K 4.0 3.8  CL 105 103  CO2 32 32  GLUCOSE 126* 120*  BUN 14 16  CREATININE 0.57 0.53  CALCIUM 8.3* 8.3*   PT/INR No results for input(s): LABPROT, INR in the last 72 hours. CMP     Component Value Date/Time   NA 141 09/08/2018 0501   NA 149 (H) 03/04/2018 1235   K 3.8 09/08/2018 0501   CL 103 09/08/2018 0501   CO2 32 09/08/2018 0501   GLUCOSE 120 (H) 09/08/2018 0501   BUN 16 09/08/2018 0501   BUN 13 03/04/2018 1235   CREATININE 0.53 09/08/2018 0501   CALCIUM 8.3 (L) 09/08/2018 0501   PROT 5.9 (L) 09/08/2018 0501   PROT 5.9 (L) 03/04/2018 1235   ALBUMIN 2.8 (L) 09/08/2018 0501   ALBUMIN 3.7 03/04/2018 1235   AST 18 09/08/2018  0501   ALT 8 09/08/2018 0501   ALKPHOS 52 09/08/2018 0501   BILITOT 0.4 09/08/2018 0501   BILITOT <0.2 03/04/2018 1235   GFRNONAA >60 09/08/2018 0501   GFRAA >60 09/08/2018 0501   Lipase     Component Value Date/Time   LIPASE 22 09/01/2018 0312       Studies/Results: No results found.  Anti-infectives: Anti-infectives (From admission, onward)   None       Assessment/Plan  Small Bowel Obstruction  Kristen Schroeder a77 y.o.femalewith imrpoving post-surgical ileus who is 5 days s/p exploratory laparotomy with lysis of adhesions secondary tosmall bowel obstruction   - NGT removed yesterday evening  - Start on clear liquids + TID Boost Breeze Supplementation  - Begin to wean TPN later today as tolerating diet  - Monitor ongoing bowel function and abdominal exam  - Continue IVF for now, wean as diet advancing  - Pain control (minimize narcotics) PRN  - Mobilize  - DVT Prophylaxis  -- Lynden Oxford , PA-C [facility] 09/09/2018, 7:45 AM [phone] M-F: 7am - 4pm

## 2018-09-09 NOTE — Progress Notes (Signed)
Physical Therapy Treatment Patient Details Name: Kristen Schroeder MRN: [id number] DOB: Dec 27, 1940 Today's Date: 09/09/2018    History of Present Illness  77 y.o. female s/p lapro with adhesion release secodary to small bowel obstruction.  She was here with incarcerated ventral hernia repair in June 2019, had a post-op obstruction episode early on as well.  She presented this episode with abdominal pain in the left side since 10/25, associated with nausea and vomiting.      PT Comments    Pt reports continued ambulation multiple times a day with nursing on unit.  Agrees to gait now.  Stood and was able to ambulate around unit x 1 with walker and supervision with assist to manage IV pole/O2.  To bathroom to void only.  Pt on 2 lpm at rest, increased to 3 lpm with gait - stated this is her baseline at home.  No LOB or buckling noted.  Pt feels comfortable with mobility skills.   Follow Up Recommendations  Home health PT     Equipment Recommendations       Recommendations for Other Services       Precautions / Restrictions Precautions Precautions: Fall Restrictions Weight Bearing Restrictions: No    Mobility  Bed Mobility               General bed mobility comments: in recliner  Transfers                    Ambulation/Gait Ambulation/Gait assistance: Min guard;Supervision Gait Distance (Feet): 180 Feet Assistive device: Rolling walker (2 wheeled) Gait Pattern/deviations: Trunk flexed     General Gait Details: slow steady gait.  Some fatigue noted but overall does well.   Stairs             Wheelchair Mobility    Modified Rankin (Stroke Patients Only)       Balance Overall balance assessment: Modified Independent                                          Cognition Arousal/Alertness: Awake/alert Behavior During Therapy: WFL for tasks assessed/performed Overall Cognitive Status: Within Functional Limits for tasks assessed                                        Exercises      General Comments        Pertinent Vitals/Pain Pain Assessment: 0-10 Pain Score: 3  Pain Location: abdomen Pain Descriptors / Indicators: Sore    Home Living                      Prior Function            PT Goals (current goals can now be found in the care plan section) Progress towards PT goals: Progressing toward goals    Frequency    Min 2X/week      PT Plan Current plan remains appropriate    Co-evaluation              AM-PAC PT "6 Clicks" Daily Activity  Outcome Measure  Difficulty turning over in bed (including adjusting bedclothes, sheets and blankets)?: None Difficulty moving from lying on back to sitting on the side of the bed? : A Little Difficulty sitting down on  and standing up from a chair with arms (e.g., wheelchair, bedside commode, etc,.)?: A Little Help needed moving to and from a bed to chair (including a wheelchair)?: None Help needed walking in hospital room?: None Help needed climbing 3-5 steps with a railing? : A Little 6 Click Score: 21    End of Session Equipment Utilized During Treatment: Gait belt;Oxygen Activity Tolerance: Patient tolerated treatment well Patient left: with chair alarm set;with call bell/phone within reach;in chair         Time: 1053-1106 PT Time Calculation (min) (ACUTE ONLY): 13 min  Charges:  $Gait Training: 8-22 mins                     Danielle Dess, PTA 09/09/18, 12:28 PM

## 2018-09-10 LAB — GLUCOSE, CAPILLARY
Glucose-Capillary: 120 mg/dL — ABNORMAL HIGH (ref 70–99)
Glucose-Capillary: 122 mg/dL — ABNORMAL HIGH (ref 70–99)
Glucose-Capillary: 122 mg/dL — ABNORMAL HIGH (ref 70–99)
Glucose-Capillary: 126 mg/dL — ABNORMAL HIGH (ref 70–99)
Glucose-Capillary: 137 mg/dL — ABNORMAL HIGH (ref 70–99)

## 2018-09-10 MED ORDER — TRACE MINERALS CR-CU-MN-SE-ZN 10-1000-500-60 MCG/ML IV SOLN
INTRAVENOUS | Status: DC
  Administered 2018-09-10: 19:00:00 via INTRAVENOUS
  Filled 2018-09-10: qty 960

## 2018-09-10 MED ORDER — FAT EMULSION PLANT BASED 20 % IV EMUL
250.0000 mL | INTRAVENOUS | Status: AC
  Administered 2018-09-10: 250 mL via INTRAVENOUS
  Filled 2018-09-10: qty 250

## 2018-09-10 NOTE — Consult Note (Signed)
PHARMACY - ADULT TOTAL PARENTERAL NUTRITION CONSULT NOTE   Patient Measurements: Height: 5\' 6"  (167.6 cm) Weight: 205 lb 11 oz (93.3 kg) IBW/kg (Calculated) : 59.3   Body mass index is 33.2 kg/m.  Assessment: 77 y.o.female who is 5 day s/p exploratory laparotomy with lysis of adhesions secondary to SBO. Her last po intake was 7 days prior to TPN start   GI: famotidine Endo: SSI Insulin requirements in the past 24 hours: 6 units (BG: 97-126) Lytes: No additional replacement needed: K: 3.8, Mg:1.9, Phos: 3.1  Renal:  Pulm: Cards:  Hepatobil: Neuro: ID: WBC wnl, afebrile  TPN Access: 09/05/18 TPN start date: 09/05/18 Nutritional Goals (per RD recommendation on 09/05/18): KCal: 1894 Protein: 100g Fluid:  Goal TPN rate is 83 ml/hr (provides 90% of needs)  Current Nutrition: none  Plan:  Decrease rate of Clinimix E 5/15 TPN to 40 mL/hr + lipid emulsion 20% at 62ml/hr x 12 hours  This TPN provides 99.8 g of protein, 299.5 g of dextrose, and 36 g of lipids which provides 912 kCals per day, meeting patient needs  Electrolytes in TPN: Clinimix E Add MVI, trace elements to TPN Resistant q6h SSI and adjust as needed NS IVMF reduced to 17 ml/hr F/U with Thurs 11/07 am labs.  Orinda Kenner, PharmD Clinical Pharmacist 09/10/2018,11:27 AM

## 2018-09-10 NOTE — Progress Notes (Signed)
[facility] Progress Note  6 Days Post-Op  Subjective: No acute events overnight. She has been tolerating full liquids to this point. She denied any abdominal pain, nausea, or emesis. Mobilizing well. Continues to endorse flatus but no BM.   Objective: Vital signs in last 24 hours: Temp:  [98 F (36.7 C)-98.2 F (36.8 C)] 98.2 F (36.8 C) (11/06 0340) Pulse Rate:  [72-84] 84 (11/06 0340) Resp:  [16-20] 20 (11/06 0340) BP: (142-151)/(53-54) 151/53 (11/06 0340) SpO2:  [98 %-100 %] 98 % (11/06 0828) Weight:  [93.3 kg] 93.3 kg (11/06 0500) Last BM Date: 08/31/18  Intake/Output from previous day: 11/05 0701 - 11/06 0700 In: 3807.6 [P.O.:680; I.V.:3027.6; IV Piggyback:100] Out: 2350 [Urine:2350] Intake/Output this shift: Total I/O In: -  Out: 500 [Urine:500]  PE: Gen: Alert, NAD, pleasant Pulm: Normal effort Abd: Soft,non-tender, non-distended,midline laparotomyincision C/D/Iwithout surrounding erythema or drainage Skin: warm and dry, no rashes  Psych: A&Ox3  Lab Results:  Recent Labs    09/08/18 0501  WBC 6.5  HGB 9.5*  HCT 31.7*  PLT 182   BMET Recent Labs    09/08/18 0501  NA 141  K 3.8  CL 103  CO2 32  GLUCOSE 120*  BUN 16  CREATININE 0.53  CALCIUM 8.3*   PT/INR No results for input(s): LABPROT, INR in the last 72 hours. CMP     Component Value Date/Time   NA 141 09/08/2018 0501   NA 149 (H) 03/04/2018 1235   K 3.8 09/08/2018 0501   CL 103 09/08/2018 0501   CO2 32 09/08/2018 0501   GLUCOSE 120 (H) 09/08/2018 0501   BUN 16 09/08/2018 0501   BUN 13 03/04/2018 1235   CREATININE 0.53 09/08/2018 0501   CALCIUM 8.3 (L) 09/08/2018 0501   PROT 5.9 (L) 09/08/2018 0501   PROT 5.9 (L) 03/04/2018 1235   ALBUMIN 2.8 (L) 09/08/2018 0501   ALBUMIN 3.7 03/04/2018 1235   AST 18 09/08/2018 0501   ALT 8 09/08/2018 0501   ALKPHOS 52 09/08/2018 0501   BILITOT 0.4 09/08/2018 0501   BILITOT <0.2 03/04/2018 1235   GFRNONAA >60 09/08/2018  0501   GFRAA >60 09/08/2018 0501   Lipase     Component Value Date/Time   LIPASE 22 09/01/2018 0312       Studies/Results: No results found.  Anti-infectives: Anti-infectives (From admission, onward)   None       Assessment/Plan  Small Bowel Obstruction  Kristen Schroeder a77 y.o.femalewith imrpoving post-surgical ileus who is6dayss/p exploratory laparotomy with lysis of adhesions secondary tosmall bowel obstruction   - Advance to regular diet this afternoon  - Wean TPN, likely stop tomorrow morning, appreciate pharmacy and dietitian help  - Monitor ongoing bowel function and abdominal exam  - Pain control (minimize narcotics) PRN             - Mobilize             - DVT Prophylaxis  - Discharge Planning: Likely home tomorrow  -- Lynden Oxford , PA-C [name] 09/10/2018, 9:13 AM [phone] M-F: 7am - 4pm

## 2018-09-11 LAB — COMPREHENSIVE METABOLIC PANEL
ALT: 21 U/L (ref 0–44)
AST: 25 U/L (ref 15–41)
Albumin: 2.9 g/dL — ABNORMAL LOW (ref 3.5–5.0)
Alkaline Phosphatase: 62 U/L (ref 38–126)
Anion gap: 4 — ABNORMAL LOW (ref 5–15)
BUN: 15 mg/dL (ref 8–23)
CO2: 36 mmol/L — ABNORMAL HIGH (ref 22–32)
Calcium: 8.8 mg/dL — ABNORMAL LOW (ref 8.9–10.3)
Chloride: 103 mmol/L (ref 98–111)
Creatinine, Ser: 0.52 mg/dL (ref 0.44–1.00)
GFR calc Af Amer: >60 mL/min (ref >60)
GFR calc non Af Amer: >60 mL/min (ref >60)
Glucose, Bld: 133 mg/dL — ABNORMAL HIGH (ref 70–99)
Potassium: 4.3 mmol/L (ref 3.5–5.1)
Sodium: 143 mmol/L (ref 135–145)
Total Bilirubin: 0.3 mg/dL (ref 0.3–1.2)
Total Protein: 6 g/dL — ABNORMAL LOW (ref 6.5–8.1)

## 2018-09-11 LAB — PHOSPHORUS: Phosphorus: 3.2 mg/dL (ref 2.5–4.6)

## 2018-09-11 LAB — GLUCOSE, CAPILLARY: Glucose-Capillary: 123 mg/dL — ABNORMAL HIGH (ref 70–99)

## 2018-09-11 LAB — MAGNESIUM: Magnesium: 1.9 mg/dL (ref 1.7–2.4)

## 2018-09-11 MED ORDER — OXYCODONE-ACETAMINOPHEN 5-325 MG PO TABS: 1.0000 | ORAL_TABLET | Freq: Four times a day (QID) | ORAL | 0 refills | Status: DC | PRN

## 2018-09-11 NOTE — Care Management Important Message (Signed)
Copy of signed IM left with patient in room.  

## 2018-09-11 NOTE — Discharge Instructions (Signed)
In addition to included general post-operative instructions for exploratory laparotomy,  Diet: Resume home heart healthy diet.   Activity: No heavy lifting >20 pounds (children, pets, laundry, garbage) or strenuous activity until follow-up, but light activity and walking are encouraged. Do not drive or drink alcohol if taking narcotic pain medications.  Wound care: You may shower/get incision wet with soapy water and pat dry (do not rub incisions), but no baths or submerging incision underwater until follow-up.   Medications: Resume all home medications. For mild to moderate pain: acetaminophen (Tylenol) or ibuprofen/naproxen (if no kidney disease). Combining Tylenol with alcohol can substantially increase your risk of causing liver disease. Narcotic pain medications, if prescribed, can be used for severe pain, though may cause nausea, constipation, and drowsiness. Do not combine Tylenol and Percocet (or similar) within a 6 hour period as Percocet (and similar) contain(s) Tylenol. If you do not need the narcotic pain medication, you do not need to fill the prescription.  Call office [phone] / [phone]) at any time if any questions, worsening pain, fevers/chills, bleeding, drainage from incision site, or other concerns.

## 2018-09-11 NOTE — Progress Notes (Signed)
Discharge order received. Patient is alert and oriented. Vital signs stable . No signs of acute distress. Discharge instructions given. Patient verbalized understanding. No other issues noted at this time.   

## 2018-09-11 NOTE — Progress Notes (Deleted)
Discharge Summary  Patient ID: Kristen Schroeder MRN: [id number] DOB/AGE: 1940-12-30 77 y.o.  Admit date: 09/01/2018 Discharge date: 09/11/2018  Discharge Diagnoses Small Bowel Obstruction   Consultants None  Procedures Exploratory Laparotomy with Lysis of Adhesions on 10/31 with dr Hampton Abbot  HPI: Kristen Schroeder is a 77 y.o. female with a history of incarcerated ventral hernia repair in June 2019 with Dr. Dahlia Byes.  She had a post-op obstruction episode early on as well.  She presents to the ED because of worsening abdominal pain in the left side since Friday night (10/25), associated with nausea and vomiting.  She does report having a bowel movement yesterday and had flatus as well.  The pain is mostly on the left and mid abdomen, without radiation.  Denies any fevers at home.  Denies any new bulging sensation. In the ED, her workup showed normal labs with a normal WBC, but a CT scan concerning for small bowel obstruction.  I have independently viewed the CT scan.  It is unclear if the obstruction is due to adhesions or from an internal hernia, difficult to distinguish between the two. No free air or significant stranding to indicate ischemia or perforation.   Hospital Course: Patient was admitted to general surgery for attempted conservative management of her SBO. On 10/31 she underwent KUB which did not show resolution of her small bowel dilation and the decision was mad to to undergo exploratory laparotomy. Patient underwent uneventful exploratory laparotomy with lysis of adhesion (Dr. Olean Ree, MD, 09/04/2018).  Post-operatively, the patient required TPN nutrition for a few days given her lack of PO intake prior to the surgery and mild post-surgical ileus post-operatively. Otherwise patient's pain and nausea improved/resolved and advancement of patient's diet and ambulation were well-tolerated. The remainder of patient's hospital course was essentially unremarkable, and discharge planning was  initiated accordingly with patient safely able to be discharged home with appropriate discharge instructions, pain control, and outpatient follow-up after all of her and her family's questions were answered to their expressed satisfaction.   Physical Examination Gen: Alert, NAD, pleasant Pulm: Normal effort Abd: Soft,non-tender, non-distended,midline laparotomyincision C/D/Iwithout surrounding erythema or drainage Skin: warm and dry, no rashes  Psych: A&Ox3   Allergies as of 09/11/2018      Reactions   Codeine Nausea And Vomiting, Nausea Only      Medication List    TAKE these medications   arformoterol 15 MCG/2ML Nebu Commonly known as:  BROVANA Take 2 mLs (15 mcg total) by nebulization 2 (two) times daily.   aspirin EC 81 MG tablet Take 81 mg by mouth daily.   blood glucose meter kit and supplies Kit Dispense based on patient and insurance preference. Use up to four times daily as directed. (FOR ICD-9 250.00, 250.01).   fluticasone 50 MCG/ACT nasal spray Commonly known as:  FLONASE Place 1 spray into both nostrils daily as needed.   furosemide 20 MG tablet Commonly known as:  LASIX Take 1 tablet (20 mg total) by mouth daily.   gabapentin 300 MG capsule Commonly known as:  NEURONTIN 2 tablets in the morning, 1 tablet in the afternoon and 2 tablets in the evening   glucose blood test strip USE 1 STRIP 2 TIMES DAILY AND AS NEEDED   ipratropium-albuterol 0.5-2.5 (3) MG/3ML Soln Commonly known as:  DUONEB INHALE 1 VIAL VIA NEBULIZER EVERY 6 HOURS AS NEEDED.   isosorbide mononitrate 30 MG 24 hr tablet Commonly known as:  IMDUR Take 30 mg by mouth daily as  needed (high bp).   levothyroxine 125 MCG tablet Commonly known as:  SYNTHROID, LEVOTHROID TAKE 1 TABLET BY MOUTH DAILY   lisinopril 20 MG tablet Commonly known as:  PRINIVIL,ZESTRIL Take 1 tablet (20 mg total) by mouth daily.   loratadine 10 MG tablet Commonly known as:  CLARITIN TAKE 1 TABLET (10 MG  TOTAL) BY MOUTH DAILY.   LORazepam 0.5 MG tablet Commonly known as:  ATIVAN Take 1 tablet (0.5 mg total) by mouth 2 (two) times daily as needed for anxiety.   metFORMIN 1000 MG tablet Commonly known as:  GLUCOPHAGE TAKE 1 TABLET BY MOUTH TWICE A DAY What changed:  when to take this   mometasone-formoterol 100-5 MCG/ACT Aero Commonly known as:  DULERA Inhale 2 puffs into the lungs 2 (two) times daily.   MULTIPLE VITAMIN PO Take 1 tablet by mouth daily.   omeprazole 40 MG capsule Commonly known as:  PRILOSEC Take 1 capsule (40 mg total) by mouth daily.   ondansetron 4 MG disintegrating tablet Commonly known as:  ZOFRAN-ODT TAKE 1 TABLET BY MOUTH EVERY 8 HOURS AS NEEDED FOR NAUSEA AND VOMITING   ONE TOUCH ULTRA 2 w/Device Kit 1 each by Does not apply route daily.   oxyCODONE-acetaminophen 5-325 MG tablet Commonly known as:  PERCOCET/ROXICET Take 1 tablet by mouth every 6 (six) hours as needed for severe pain.   OXYGEN Inhale 4 L into the lungs.   PARoxetine 10 MG tablet Commonly known as:  PAXIL Take 1 tablet (10 mg total) by mouth at bedtime.   pravastatin 10 MG tablet Commonly known as:  PRAVACHOL TAKE 1 TABLET (10 MG TOTAL) BY MOUTH DAILY.        Follow-up Information    Piscoya, Jacqulyn Bath, MD. Schedule an appointment as soon as possible for a visit in 1 week(s).   Specialty:  General Surgery Why:  s/p exploratory laparotomy on 10/31 with dr Hampton Abbot...Marland Kitchenstill has staples will need removed in follow up Contact information: [address] [place] [address] [phone]           Signed: Edison Simon , PA-C [facility]  09/11/2018, 9:42 AM [phone] M-F: 7am - 4pm

## 2018-09-11 NOTE — Discharge Summary (Signed)
Discharge Summary  Patient ID: Kristen Schroeder MRN: [id number] DOB/AGE: 07/22/1941 77 y.o.  Admit date: 09/01/2018 Discharge date: 09/11/2018  Discharge Diagnoses Small Bowel Obstruction   Consultants None  Procedures Exploratory Laparotomy with Lysis of Adhesions on 10/31 with dr Piscoya  HPI: Kristen Schroeder is a 77 y.o. female with a history of incarcerated ventral hernia repair in June 2019 with Dr. Pabon.  She had a post-op obstruction episode early on as well.  She presents to the ED because of worsening abdominal pain in the left side since Friday night (10/25), associated with nausea and vomiting.  She does report having a bowel movement yesterday and had flatus as well.  The pain is mostly on the left and mid abdomen, without radiation.  Denies any fevers at home.  Denies any new bulging sensation. In the ED, her workup showed normal labs with a normal WBC, but a CT scan concerning for small bowel obstruction.  I have independently viewed the CT scan.  It is unclear if the obstruction is due to adhesions or from an internal hernia, difficult to distinguish between the two. No free air or significant stranding to indicate ischemia or perforation.   Hospital Course: Patient was admitted to general surgery for attempted conservative management of her SBO. On 10/31 she underwent KUB which did not show resolution of her small bowel dilation and the decision was mad to to undergo exploratory laparotomy. Patient underwent uneventful exploratory laparotomy with lysis of adhesion (Dr. Jose Piscoya, MD, 09/04/2018).  Post-operatively, the patient required TPN nutrition for a few days given her lack of PO intake prior to the surgery and mild post-surgical ileus post-operatively. Otherwise patient's pain and nausea improved/resolved and advancement of patient's diet and ambulation were well-tolerated. The remainder of patient's hospital course was essentially unremarkable, and discharge planning was  initiated accordingly with patient safely able to be discharged home with appropriate discharge instructions, pain control, and outpatient follow-up after all of her and her family's questions were answered to their expressed satisfaction.   Physical Examination Gen: Alert, NAD, pleasant Pulm: Normal effort Abd: Soft,non-tender, non-distended,midline laparotomyincision C/D/Iwithout surrounding erythema or drainage Skin: warm and dry, no rashes  Psych: A&Ox3   Allergies as of 09/11/2018      Reactions   Codeine Nausea And Vomiting, Nausea Only      Medication List    TAKE these medications   arformoterol 15 MCG/2ML Nebu Commonly known as:  BROVANA Take 2 mLs (15 mcg total) by nebulization 2 (two) times daily.   aspirin EC 81 MG tablet Take 81 mg by mouth daily.   blood glucose meter kit and supplies Kit Dispense based on patient and insurance preference. Use up to four times daily as directed. (FOR ICD-9 250.00, 250.01).   fluticasone 50 MCG/ACT nasal spray Commonly known as:  FLONASE Place 1 spray into both nostrils daily as needed.   furosemide 20 MG tablet Commonly known as:  LASIX Take 1 tablet (20 mg total) by mouth daily.   gabapentin 300 MG capsule Commonly known as:  NEURONTIN 2 tablets in the morning, 1 tablet in the afternoon and 2 tablets in the evening   glucose blood test strip USE 1 STRIP 2 TIMES DAILY AND AS NEEDED   ipratropium-albuterol 0.5-2.5 (3) MG/3ML Soln Commonly known as:  DUONEB INHALE 1 VIAL VIA NEBULIZER EVERY 6 HOURS AS NEEDED.   isosorbide mononitrate 30 MG 24 hr tablet Commonly known as:  IMDUR Take 30 mg by mouth daily as   needed (high bp).   levothyroxine 125 MCG tablet Commonly known as:  SYNTHROID, LEVOTHROID TAKE 1 TABLET BY MOUTH DAILY   lisinopril 20 MG tablet Commonly known as:  PRINIVIL,ZESTRIL Take 1 tablet (20 mg total) by mouth daily.   loratadine 10 MG tablet Commonly known as:  CLARITIN TAKE 1 TABLET (10 MG  TOTAL) BY MOUTH DAILY.   LORazepam 0.5 MG tablet Commonly known as:  ATIVAN Take 1 tablet (0.5 mg total) by mouth 2 (two) times daily as needed for anxiety.   metFORMIN 1000 MG tablet Commonly known as:  GLUCOPHAGE TAKE 1 TABLET BY MOUTH TWICE A DAY What changed:  when to take this   mometasone-formoterol 100-5 MCG/ACT Aero Commonly known as:  DULERA Inhale 2 puffs into the lungs 2 (two) times daily.   MULTIPLE VITAMIN PO Take 1 tablet by mouth daily.   omeprazole 40 MG capsule Commonly known as:  PRILOSEC Take 1 capsule (40 mg total) by mouth daily.   ondansetron 4 MG disintegrating tablet Commonly known as:  ZOFRAN-ODT TAKE 1 TABLET BY MOUTH EVERY 8 HOURS AS NEEDED FOR NAUSEA AND VOMITING   ONE TOUCH ULTRA 2 w/Device Kit 1 each by Does not apply route daily.   oxyCODONE-acetaminophen 5-325 MG tablet Commonly known as:  PERCOCET/ROXICET Take 1 tablet by mouth every 6 (six) hours as needed for severe pain.   OXYGEN Inhale 4 L into the lungs.   PARoxetine 10 MG tablet Commonly known as:  PAXIL Take 1 tablet (10 mg total) by mouth at bedtime.   pravastatin 10 MG tablet Commonly known as:  PRAVACHOL TAKE 1 TABLET (10 MG TOTAL) BY MOUTH DAILY.        Follow-up Information    Piscoya, Jose, MD. Schedule an appointment as soon as possible for a visit in 1 week(s).   Specialty:  General Surgery Why:  s/p exploratory laparotomy on 10/31 with dr piscoya....still has staples will need removed in follow up Contact information: [address] [place] [address] [phone]           Signed: Nyanna Heideman , PA-C [facility]  09/11/2018, 9:42 AM [phone] M-F: 7am - 4pm  

## 2018-09-11 NOTE — Care Management Note (Signed)
Case Management Note  Patient Details  Name: Kristen Schroeder MRN: [id number] Date of Birth: 1941/02/06   Patient discharged home today.  Barbara Cower with [facility] notified.  Husband brought portable tank for discharge.   Subjective/Objective:                    Action/Plan:   Expected Discharge Date:  09/11/18               Expected Discharge Plan:  Home w Home Health Services  In-House Referral:     Discharge planning Services  CM Consult  Post Acute Care Choice:  Home Health Choice offered to:  Patient  DME Arranged:    DME Agency:     HH Arranged:  PT HH Agency:  [facility]  Status of Service:  Completed, signed off  If discussed at Long Length of Stay Meetings, dates discussed:    Additional Comments:  Chapman Fitch, RN 09/11/2018, 4:52 PM

## 2018-09-14 DIAGNOSIS — J449 Chronic obstructive pulmonary disease, unspecified: Secondary | ICD-10-CM | POA: Diagnosis not present

## 2018-09-16 ENCOUNTER — Telehealth: Payer: Self-pay | Admitting: Family Medicine

## 2018-09-16 DIAGNOSIS — E039 Hypothyroidism, unspecified: Secondary | ICD-10-CM | POA: Diagnosis not present

## 2018-09-16 DIAGNOSIS — Z48815 Encounter for surgical aftercare following surgery on the digestive system: Secondary | ICD-10-CM | POA: Diagnosis not present

## 2018-09-16 DIAGNOSIS — E1140 Type 2 diabetes mellitus with diabetic neuropathy, unspecified: Secondary | ICD-10-CM | POA: Diagnosis not present

## 2018-09-16 DIAGNOSIS — J449 Chronic obstructive pulmonary disease, unspecified: Secondary | ICD-10-CM | POA: Diagnosis not present

## 2018-09-16 DIAGNOSIS — Z7951 Long term (current) use of inhaled steroids: Secondary | ICD-10-CM | POA: Diagnosis not present

## 2018-09-16 DIAGNOSIS — I10 Essential (primary) hypertension: Secondary | ICD-10-CM | POA: Diagnosis not present

## 2018-09-16 DIAGNOSIS — K565 Intestinal adhesions [bands], unspecified as to partial versus complete obstruction: Secondary | ICD-10-CM | POA: Diagnosis not present

## 2018-09-16 DIAGNOSIS — Z9981 Dependence on supplemental oxygen: Secondary | ICD-10-CM | POA: Diagnosis not present

## 2018-09-16 DIAGNOSIS — Z87891 Personal history of nicotine dependence: Secondary | ICD-10-CM | POA: Diagnosis not present

## 2018-09-16 DIAGNOSIS — Z7984 Long term (current) use of oral hypoglycemic drugs: Secondary | ICD-10-CM | POA: Diagnosis not present

## 2018-09-16 DIAGNOSIS — Z7982 Long term (current) use of aspirin: Secondary | ICD-10-CM | POA: Diagnosis not present

## 2018-09-16 NOTE — Telephone Encounter (Signed)
Advised Stacey.

## 2018-09-16 NOTE — Telephone Encounter (Signed)
Kristen Schroeder with [facility] is requesting verbal orders for in home physical therapy as follows:  2w1 1w3  Please advise. Thanks TNP

## 2018-09-16 NOTE — Telephone Encounter (Signed)
ok 

## 2018-09-19 ENCOUNTER — Encounter: Payer: Self-pay | Admitting: Surgery

## 2018-09-19 ENCOUNTER — Other Ambulatory Visit: Payer: Self-pay

## 2018-09-19 ENCOUNTER — Ambulatory Visit (INDEPENDENT_AMBULATORY_CARE_PROVIDER_SITE_OTHER): Payer: Medicare Other | Admitting: Surgery

## 2018-09-19 VITALS — BP 158/79 | HR 98 | Temp 97.5°F | Resp 14 | Ht 66.0 in | Wt 211.4 lb

## 2018-09-19 DIAGNOSIS — K56609 Unspecified intestinal obstruction, unspecified as to partial versus complete obstruction: Secondary | ICD-10-CM

## 2018-09-19 DIAGNOSIS — Z09 Encounter for follow-up examination after completed treatment for conditions other than malignant neoplasm: Secondary | ICD-10-CM

## 2018-09-19 NOTE — Progress Notes (Signed)
09/19/2018  HPI: Kristen Schroeder is a 77 y.o. female s/p exploratory laparotomy and lysis of adhesions for an internal hernia causing small bowel obstruction on 09/04/18.  She was discharged to home on 11/7 and presents today for follow up.  She's been doing well without any issues and without any pain.  She's tolerating a diet and having bowel function.  Vital signs: BP (!) 158/79   Pulse 98   Temp (!) 97.5 F (36.4 C) (Temporal)   Resp 14   Ht 5\' 6"  (1.676 m)   Wt 211 lb 6.4 oz (95.9 kg)   SpO2 (!) 89%   BMI 34.12 kg/m    Physical Exam: Constitutional: No acute distress Abdomen:  Soft, nondistended, nontender to palpation.  Midline incision is clean, dry, intact with staples.  Staples were removed and changed to steri strips without any issues.  Assessment/Plan: This is a 77 y.o. female s/p exlap and lysis of adhesions.  --Patient doing well two weeks post-op.  Staples removed and changed to steri strips --May advance to regular diet (currently on soft).  Two more weeks of no heavy lifting or pushing of no more than 10-15 lbs.   --follow up prn.   Howie IllJose Luis Johndavid Geralds, MD [facility]

## 2018-09-19 NOTE — Patient Instructions (Addendum)
Patient is to return to the office as needed.  Call the office with any questions or concerns. 

## 2018-09-22 ENCOUNTER — Telehealth: Payer: Self-pay | Admitting: Family Medicine

## 2018-09-22 NOTE — Telephone Encounter (Signed)
Samantha with [facility] is requesting verbal orders to make up missed visit. Pt wasn't home for their visit on 09/19/18 and Lelon MastSamantha is requesting to make up the missed visit by adding a visit to the end of the order for in home physical therapy extending the order to the first part of December. Please advise. Thanks TNP

## 2018-09-22 NOTE — Telephone Encounter (Signed)
Advised  ED 

## 2018-09-24 ENCOUNTER — Ambulatory Visit: Payer: Self-pay

## 2018-09-24 ENCOUNTER — Ambulatory Visit (INDEPENDENT_AMBULATORY_CARE_PROVIDER_SITE_OTHER): Payer: Medicare Other | Admitting: Family Medicine

## 2018-09-24 VITALS — BP 160/70 | HR 97 | Temp 98.1°F | Resp 20 | Wt 213.0 lb

## 2018-09-24 DIAGNOSIS — I10 Essential (primary) hypertension: Secondary | ICD-10-CM

## 2018-09-24 DIAGNOSIS — E039 Hypothyroidism, unspecified: Secondary | ICD-10-CM | POA: Diagnosis not present

## 2018-09-24 DIAGNOSIS — R739 Hyperglycemia, unspecified: Secondary | ICD-10-CM

## 2018-09-24 DIAGNOSIS — M25552 Pain in left hip: Secondary | ICD-10-CM | POA: Diagnosis not present

## 2018-09-24 DIAGNOSIS — J42 Unspecified chronic bronchitis: Secondary | ICD-10-CM

## 2018-09-24 DIAGNOSIS — E7849 Other hyperlipidemia: Secondary | ICD-10-CM | POA: Diagnosis not present

## 2018-09-24 DIAGNOSIS — F325 Major depressive disorder, single episode, in full remission: Secondary | ICD-10-CM

## 2018-09-24 DIAGNOSIS — Z23 Encounter for immunization: Secondary | ICD-10-CM

## 2018-09-24 LAB — POCT GLYCOSYLATED HEMOGLOBIN (HGB A1C): Hemoglobin A1C: 6.4 % — AB (ref 4.0–5.6)

## 2018-09-24 MED ORDER — KETOROLAC TROMETHAMINE 60 MG/2ML IM SOLN
30.0000 mg | Freq: Once | INTRAMUSCULAR | Status: AC
  Administered 2018-09-24: 30 mg via INTRAMUSCULAR

## 2018-09-24 MED ORDER — KETOROLAC TROMETHAMINE 30 MG/ML IJ SOLN: 30.0000 mg | Freq: Once | INTRAMUSCULAR | Status: DC

## 2018-09-24 NOTE — Chronic Care Management (AMB) (Signed)
  Chronic Care Management   Note  09/24/2018 Name: Kristen Schroeder MRN: [id number] DOB: 1941/04/18   [name] is a 77 y.o. year old female who sees Jerrol Banana., MD for primary care. Dr. Rosanna Randy asked the CCM team to consult the patient for assistance with chronic disease management related to HTN, chronic Bronchitis, O2 dependency and CHF. Patient also needs financial assistance with her [facility] inhaler. Referral was placed today, 09/24/18 during patient's routine office visit. I met with [name] face to face and her husband to provide an introduction to CCM services. [name] will discuss these services with her spouse and will contact the CCM team when ready to enroll into the program.     Plan: The CCM team will remain available to [name] in the event she decides to enroll for CCM services.    Weslyn Holsonback E. Rollene Rotunda, RN, BSN Nurse Care Coordinator [facility] Care Management [phone]

## 2018-09-24 NOTE — Progress Notes (Signed)
Kristen Schroeder  MRN: [id number] DOB: [birth year]  Subjective:  HPI   The patient is a 77 year old female who presents for 6 month follow up of chronic health.  She was last seen on 04/01/18.   She has had 2 recent hospitalizations for small bowel obstruction.  She is slowly recovering. Hypertension BP Readings from Last 3 Encounters:  09/24/18 (!) 160/70  09/19/18 (!) 158/79  09/11/18 (!) 132/59   Hyperlipidemia Lab Results  Component Value Date   CHOL 185 12/04/2017   HDL 65 12/04/2017   LDLCALC 94 12/04/2017   TRIG 210 (H) 09/08/2018   CHOLHDL 3.3 06/10/2015   Hypothyroidism Lab Results  Component Value Date   TSH 0.877 03/04/2018   Depression-Last PHQ9 was less than 4 months ago.  Patient in remission per score.  Depression screen [name] 2/9 05/29/2018 01/28/2018 12/04/2017 12/04/2017 07/11/2017  Decreased Interest 0 0 0 0 0  Down, Depressed, Hopeless 0 0 0 0 0  PHQ - 2 Score 0 0 0 0 0  Altered sleeping - - 0 - 0  Tired, decreased energy - - 0 - 3  Change in appetite - - 0 - 0  Feeling bad or failure about yourself  - - 0 - 0  Trouble concentrating - - 0 - 0  Moving slowly or fidgety/restless - - 2 - 0  Suicidal thoughts - - 0 - 0  PHQ-9 Score - - 2 - 3  Difficult doing work/chores - - Somewhat difficult - Not difficult at all   Diabetes-the patient has been checking her glucose and has been getting readings that range from 100-150.   Lab Results  Component Value Date   HGBA1C 5.3 03/04/2018   COPD-followed by Pulmonology She uses O2 via [place].  Had recent increasing problems with left hip pain.  Pain to be when she sits or lays on the hip.  Does not appear to be weightbearing.  Area of the left greater trochanter.  Patient Active Problem List   Diagnosis Date Noted  . SBO (small bowel obstruction) ([place]) 04/22/2018  . Incarcerated hernia 04/14/2018  . Incarcerated hernia of abdominal cavity   . Aneurysm ([place]) 07/10/2016  . Nonspecific abnormal finding 05/11/2015    . Allergic rhinitis 05/11/2015  . Anxiety 05/11/2015  . Bronchitis, chronic ([place]) 05/11/2015  . CCF (congestive cardiac failure) ([place]) 05/11/2015  . Clinical depression 05/11/2015  . 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 Essential (primary) hypertension 05/11/2015  . Acid reflux 05/11/2015  . HLD (hyperlipidemia) 05/11/2015  . Adult hypothyroidism 05/11/2015  . Cervical dysplasia, mild 05/11/2015  . Neuropathy 05/11/2015  . Adiposity 05/11/2015  . Obstructive apnea 05/11/2015  . Arthritis, degenerative 05/11/2015    Past Medical History:  Diagnosis Date  . Arthritis   . COPD (chronic obstructive pulmonary disease) ([place])   . Diabetes mellitus without complication ([place])   . Dysrhythmia   . Heart murmur   . History of orthopnea   . Hypertension   . Hypothyroidism   . Neuropathy   . Oxygen dependent    3L  CONTINUOUS  . Pain CHRONIC BACK PAIN  . Shortness of breath dyspnea   . Wheezing     Social History   Socioeconomic History  . Marital status: Married    Spouse name: Not on file  . Number of children: 4  . Years of education: Not on file  . Highest education level: 8th grade  Occupational History  . Occupation: retired  Social Needs  . Financial resource strain: Not hard at all  . Food insecurity:    Worry: Never true    Inability: Never true  . Transportation needs:    Medical: No    Non-medical: No  Tobacco Use  . Smoking status: Former Smoker    Packs/day: 0.50    Years: 15.00    Pack years: 7.50  . Smokeless tobacco: Never Used  . Tobacco comment: 30-40 years ago  Substance and Sexual Activity  . Alcohol use: No    Alcohol/week: 0.0 standard drinks  . Drug use: No  . Sexual activity: Never  Lifestyle  . Physical activity:    Days per week: Not on file    Minutes per session: Not on file  . Stress: Not at all  Relationships  . Social connections:    Talks on phone: Not on file    Gets together: Not on file    Attends  religious service: Not on file    Active member of club or organization: Not on file    Attends meetings of clubs or organizations: Not on file    Relationship status: Not on file  . Intimate partner violence:    Fear of current or ex partner: No    Emotionally abused: No    Physically abused: No    Forced sexual activity: No  Other Topics Concern  . Not on file  Social History Narrative  . Not on file    Outpatient Encounter Medications as of 09/24/2018  Medication Sig  . arformoterol (BROVANA) 15 MCG/2ML NEBU Take 2 mLs (15 mcg total) by nebulization 2 (two) times daily.  Marland Kitchen aspirin EC 81 MG tablet Take 81 mg by mouth daily.  . blood glucose meter kit and supplies KIT Dispense based on patient and insurance preference. Use up to four times daily as directed. (FOR ICD-9 250.00, 250.01).  . Blood Glucose Monitoring Suppl (ONE TOUCH ULTRA 2) w/Device KIT 1 each by Does not apply route daily.  . fluticasone (FLONASE) 50 MCG/ACT nasal spray Place 1 spray into both nostrils daily as needed.   . furosemide (LASIX) 20 MG tablet Take 1 tablet (20 mg total) by mouth daily.  Marland Kitchen gabapentin (NEURONTIN) 300 MG capsule 2 tablets in the morning, 1 tablet in the afternoon and 2 tablets in the evening  . glucose blood (ONE TOUCH ULTRA TEST) test strip USE 1 STRIP 2 TIMES DAILY AND AS NEEDED  . HYDROcodone-acetaminophen (NORCO) 10-325 MG tablet Take by mouth.  Marland Kitchen ipratropium-albuterol (DUONEB) 0.5-2.5 (3) MG/3ML SOLN INHALE 1 VIAL VIA NEBULIZER EVERY 6 HOURS AS NEEDED.  Marland Kitchen isosorbide mononitrate (IMDUR) 30 MG 24 hr tablet Take 30 mg by mouth daily as needed (high bp).  Marland Kitchen levothyroxine (SYNTHROID, LEVOTHROID) 125 MCG tablet TAKE 1 TABLET BY MOUTH DAILY  . lisinopril (PRINIVIL,ZESTRIL) 20 MG tablet Take 1 tablet (20 mg total) by mouth daily.  Marland Kitchen loratadine (CLARITIN) 10 MG tablet TAKE 1 TABLET (10 MG TOTAL) BY MOUTH DAILY.  Marland Kitchen LORazepam (ATIVAN) 0.5 MG tablet Take 1 tablet (0.5 mg total) by mouth 2 (two) times  daily as needed for anxiety.  . metFORMIN (GLUCOPHAGE) 1000 MG tablet TAKE 1 TABLET BY MOUTH TWICE A DAY (Patient taking differently: Take 1,000 mg by mouth daily with breakfast. )  . mometasone-formoterol (DULERA) 100-5 MCG/ACT AERO Inhale 2 puffs into the lungs 2 (two) times daily.  . MULTIPLE VITAMIN PO Take 1 tablet by mouth daily.   Marland Kitchen omeprazole (PRILOSEC) 40  MG capsule Take 1 capsule (40 mg total) by mouth daily.  . ondansetron (ZOFRAN-ODT) 4 MG disintegrating tablet TAKE 1 TABLET BY MOUTH EVERY 8 HOURS AS NEEDED FOR NAUSEA AND VOMITING  . oxyCODONE-acetaminophen (PERCOCET/ROXICET) 5-325 MG tablet Take 1 tablet by mouth every 6 (six) hours as needed for severe pain.  . OXYGEN Inhale 4 L into the lungs.  Marland Kitchen PARoxetine (PAXIL) 10 MG tablet Take 1 tablet (10 mg total) by mouth at bedtime.  . pravastatin (PRAVACHOL) 10 MG tablet TAKE 1 TABLET (10 MG TOTAL) BY MOUTH DAILY.  . roflumilast (DALIRESP) 500 MCG TABS tablet Take by mouth.  . tiotropium (SPIRIVA) 18 MCG inhalation capsule Place into inhaler and inhale.   No facility-administered encounter medications on file as of 09/24/2018.     Allergies  Allergen Reactions  . Codeine Nausea And Vomiting and Nausea Only    Review of Systems  Constitutional: Negative for fever and malaise/fatigue.  HENT: Negative.   Eyes: Negative.   Respiratory: Positive for shortness of breath (chronic). Negative for cough and wheezing.   Cardiovascular: Positive for orthopnea. Negative for chest pain, palpitations, claudication and leg swelling.  Gastrointestinal: Negative.   Musculoskeletal: Positive for back pain and joint pain.  Skin: Negative.   Neurological: Negative.   Endo/Heme/Allergies: Negative.   Psychiatric/Behavioral: Negative.     Objective:  BP (!) 160/70 (BP Location: Right Arm, Patient Position: Sitting, Cuff Size: Normal)   Pulse 97   Temp 98.1 F (36.7 C) (Oral)   Resp 20   Wt 213 lb (96.6 kg)   SpO2 90%   BMI 34.38 kg/m    Physical Exam  Constitutional: She is oriented to person, place, and time and well-developed, well-nourished, and in no distress.  HENT:  Head: Normocephalic and atraumatic.  Right Ear: External ear normal.  Left Ear: External ear normal.  Nose: Nose normal.  Eyes: Conjunctivae are normal. No scleral icterus.  Neck: No thyromegaly present.  Cardiovascular: Normal rate, regular rhythm and normal heart sounds.  Pulmonary/Chest: Effort normal and breath sounds normal.  Abdominal: Soft.  Musculoskeletal: She exhibits no edema.  Mildly tender over left greater trochanter.  Neurological: She is alert and oriented to person, place, and time. Gait normal. GCS score is 15.  Skin: Skin is warm and dry.  Psychiatric: Mood, memory, affect and judgment normal.    Assessment and Plan :   1. Essential (primary) hypertension  - Ambulatory referral to Chronic Care Management Services  2. Other hyperlipidemia  - Ambulatory referral to Chronic Care Management Services  3. Adult hypothyroidism  - Ambulatory referral to Chronic Care Management Services  4. Depression, major, single episode, complete remission ([place]) Overall doing well.  5. Hyperglycemia  - POCT glycosylated hemoglobin (Hb A1C) - Ambulatory referral to Chronic Care Management Services  6. Pain of left hip joint  - ketorolac (TORADOL) injection 30 mg 7.Recent surgery for SBO  8. Need for influenza vaccination  - Flu vaccine HIGH DOSE PF (Fluzone High dose) 9.Anemia  HPI, Exam and A&P Transcribed under the direction and in the presence of Miguel Aschoff, Brooke Bonito., MD. Electronically Signed: Althea Charon, RMA I have done the exam and reviewed the chart and it is accurate to the best of my knowledge. Development worker, community has been used and  any errors in dictation or transcription are unintentional. Miguel Aschoff M.D. [facility]

## 2018-09-24 NOTE — Patient Instructions (Signed)
1. Thank you for allowing the CCM team to discuss services with you today. It was a pleasure meeting you.   2. Contact the CCM team if you wish to pursue enrollment into the CCM program.  CCM (Chronic Care Management) Team   Yvone NeuPortia Aarushi Hemric RN, BSN Nurse Care Coordinator  [phone]  Karalee HeightJulie Hedrick PharmD  Clinical Pharmacist  [phone]

## 2018-09-25 ENCOUNTER — Ambulatory Visit (INDEPENDENT_AMBULATORY_CARE_PROVIDER_SITE_OTHER): Payer: Medicare Other

## 2018-09-25 DIAGNOSIS — I509 Heart failure, unspecified: Secondary | ICD-10-CM

## 2018-09-25 DIAGNOSIS — Z7951 Long term (current) use of inhaled steroids: Secondary | ICD-10-CM | POA: Diagnosis not present

## 2018-09-25 DIAGNOSIS — E039 Hypothyroidism, unspecified: Secondary | ICD-10-CM | POA: Diagnosis not present

## 2018-09-25 DIAGNOSIS — E119 Type 2 diabetes mellitus without complications: Secondary | ICD-10-CM

## 2018-09-25 DIAGNOSIS — J42 Unspecified chronic bronchitis: Secondary | ICD-10-CM

## 2018-09-25 DIAGNOSIS — J449 Chronic obstructive pulmonary disease, unspecified: Secondary | ICD-10-CM | POA: Diagnosis not present

## 2018-09-25 DIAGNOSIS — Z7984 Long term (current) use of oral hypoglycemic drugs: Secondary | ICD-10-CM | POA: Diagnosis not present

## 2018-09-25 DIAGNOSIS — I10 Essential (primary) hypertension: Secondary | ICD-10-CM

## 2018-09-25 DIAGNOSIS — Z87891 Personal history of nicotine dependence: Secondary | ICD-10-CM | POA: Diagnosis not present

## 2018-09-25 DIAGNOSIS — Z7982 Long term (current) use of aspirin: Secondary | ICD-10-CM | POA: Diagnosis not present

## 2018-09-25 DIAGNOSIS — K565 Intestinal adhesions [bands], unspecified as to partial versus complete obstruction: Secondary | ICD-10-CM | POA: Diagnosis not present

## 2018-09-25 DIAGNOSIS — E1140 Type 2 diabetes mellitus with diabetic neuropathy, unspecified: Secondary | ICD-10-CM | POA: Diagnosis not present

## 2018-09-25 DIAGNOSIS — Z48815 Encounter for surgical aftercare following surgery on the digestive system: Secondary | ICD-10-CM | POA: Diagnosis not present

## 2018-09-25 DIAGNOSIS — Z9981 Dependence on supplemental oxygen: Secondary | ICD-10-CM | POA: Diagnosis not present

## 2018-09-25 LAB — RENAL FUNCTION PANEL
Albumin: 3.9 g/dL (ref 3.5–4.8)
BUN/Creatinine Ratio: 25 (ref 12–28)
BUN: 14 mg/dL (ref 8–27)
CO2: 31 mmol/L — ABNORMAL HIGH (ref 20–29)
Calcium: 9.2 mg/dL (ref 8.7–10.3)
Chloride: 99 mmol/L (ref 96–106)
Creatinine, Ser: 0.55 mg/dL — ABNORMAL LOW (ref 0.57–1.00)
GFR calc Af Amer: 105 mL/min/{1.73_m2} (ref >59)
GFR calc non Af Amer: 91 mL/min/{1.73_m2} (ref >59)
Glucose: 78 mg/dL (ref 65–99)
Phosphorus: 3 mg/dL (ref 2.5–4.5)
Potassium: 4.2 mmol/L (ref 3.5–5.2)
Sodium: 145 mmol/L — ABNORMAL HIGH (ref 134–144)

## 2018-09-25 LAB — CBC WITH DIFFERENTIAL/PLATELET
Basophils Absolute: 0 10*3/uL (ref 0.0–0.2)
Basos: 0 %
EOS (ABSOLUTE): 0.3 10*3/uL (ref 0.0–0.4)
Eos: 4 %
Hematocrit: 30.3 % — ABNORMAL LOW (ref 34.0–46.6)
Hemoglobin: 9.6 g/dL — ABNORMAL LOW (ref 11.1–15.9)
Immature Grans (Abs): 0 10*3/uL (ref 0.0–0.1)
Immature Granulocytes: 0 %
Lymphocytes Absolute: 1.3 10*3/uL (ref 0.7–3.1)
Lymphs: 19 %
MCH: 28.2 pg (ref 26.6–33.0)
MCHC: 31.7 g/dL (ref 31.5–35.7)
MCV: 89 fL (ref 79–97)
Monocytes Absolute: 0.6 10*3/uL (ref 0.1–0.9)
Monocytes: 8 %
Neutrophils Absolute: 4.9 10*3/uL (ref 1.4–7.0)
Neutrophils: 69 %
Platelets: 261 10*3/uL (ref 150–450)
RBC: 3.4 x10E6/uL — ABNORMAL LOW (ref 3.77–5.28)
RDW: 12.3 % (ref 12.3–15.4)
WBC: 7.1 10*3/uL (ref 3.4–10.8)

## 2018-09-25 NOTE — Patient Instructions (Addendum)
1. Thank You for allowing the CCM (Chronic Care Management) Team to assist you with your healthcare goals!! We look forward to meeting you on Wednesday, 10/15/18. 2. Please bring all of your medications and glucometer with you to your appointment 3. Contact the CCM Team if you have any question or need to reschedule your initial visit.  CCM (Chronic Care Management) Team   Trish Fountain RN, BSN Nurse Care Coordinator  [phone]  Ruben Reason PharmD  Clinical Pharmacist  [phone]   Ms. Rarick was given information about Chronic Care Management services today including:  1. CCM service includes personalized support from designated clinical staff supervised by her physician, including individualized plan of care and coordination with other care providers 2. 24/7 contact phone numbers for assistance for urgent and routine care needs. 3. Service will only be billed when office clinical staff spend 20 minutes or more in a month to coordinate care. 4. Only one practitioner may furnish and bill the service in a calendar month. 5. The patient may stop CCM services at any time (effective at the end of the month) by phone call to the office staff. 6. The patient will be responsible for cost sharing (co-pay) of up to 20% of the service fee (after annual deductible is met).  Patient agreed to services and verbal consent obtained.  The patient verbalized understanding of instructions provided today and declined a print copy of patient instruction materials.

## 2018-09-25 NOTE — Chronic Care Management (AMB) (Signed)
  Chronic Care Management   Note  09/25/2018 Name: Kristen Schroeder MRN: [id number] DOB: Oct 09, 1941  [name] is a 77 y.o. year old female who sees Jerrol Banana., MD for primary care. Dr. Rosanna Randy asked the CCM team to consult the patient for assistance with chronic disease management related to HTN, chronic Bronchitis, O2 dependency and CHF. Patient also needs financial assistance with her [facility] inhaler. Referral was placed 09/24/18 during patient's routine office visit. CCM RN CM met with [name] face to face and her husband to provide an introduction to CCM services. Mrs. Staffa needed to discuss these services with her spouse and would contact the CCM team when ready to enroll into the program.   Today, [name] notified the CCM Team via phone that she would like to enroll in the CCM program and requested a face to face initial visit.   [name] was given information about Chronic Care Management services today including:  1. CCM service includes personalized support from designated clinical staff supervised by her physician, including individualized plan of care and coordination with other care providers 2. 24/7 contact phone numbers for assistance for urgent and routine care needs. 3. Service will only be billed when office clinical staff spend 20 minutes or more in a month to coordinate care. 4. Only one practitioner may furnish and bill the service in a calendar month. 5. The patient may stop CCM services at any time (effective at the end of the month) by phone call to the office staff. 6. The patient will be responsible for cost sharing (co-pay) of up to 20% of the service fee (after annual deductible is met).  Patient agreed to services and verbal consent obtained.   Plan: Then CCM team will meet with Ms. Cerino on Wednesday 10/15/18 at 1:30 pm   Davey Bergsma E. Rollene Rotunda, RN, BSN Nurse Care Coordinator [facility] Care Management [phone]

## 2018-09-26 ENCOUNTER — Telehealth: Payer: Self-pay | Admitting: *Deleted

## 2018-09-26 NOTE — Telephone Encounter (Signed)
LMOVM for pt to return call 

## 2018-09-26 NOTE — Telephone Encounter (Signed)
-----   Message from Maple Hudsonichard L Gilbert Jr., MD sent at 09/26/2018  2:44 PM EST ----- Stable.

## 2018-09-29 ENCOUNTER — Ambulatory Visit (INDEPENDENT_AMBULATORY_CARE_PROVIDER_SITE_OTHER): Payer: Medicare Other | Admitting: Internal Medicine

## 2018-09-29 ENCOUNTER — Encounter: Payer: Self-pay | Admitting: Internal Medicine

## 2018-09-29 VITALS — BP 162/88 | HR 85 | Ht 66.0 in | Wt 216.0 lb

## 2018-09-29 DIAGNOSIS — J449 Chronic obstructive pulmonary disease, unspecified: Secondary | ICD-10-CM | POA: Diagnosis not present

## 2018-09-29 DIAGNOSIS — J9611 Chronic respiratory failure with hypoxia: Secondary | ICD-10-CM | POA: Diagnosis not present

## 2018-09-29 DIAGNOSIS — Z9981 Dependence on supplemental oxygen: Secondary | ICD-10-CM | POA: Diagnosis not present

## 2018-09-29 DIAGNOSIS — I509 Heart failure, unspecified: Secondary | ICD-10-CM

## 2018-09-29 DIAGNOSIS — R0602 Shortness of breath: Secondary | ICD-10-CM | POA: Diagnosis not present

## 2018-09-29 NOTE — Patient Instructions (Signed)

## 2018-09-29 NOTE — Progress Notes (Signed)
[facility] [place], [place] [address]  Pulmonary Sleep Medicine   Office Visit Note  Patient Name: Kristen Schroeder DOB: [birth year] MRN [id number]  Date of Service: 09/29/2018  Complaints/HPI: She is here for follow-up apparently was back in the hospital again for intestinal obstruction.  Patient ended up having to have laparoscopic be with adhesion lysis again.  She actually tolerated the procedure fairly well patient had no flareups of her COPD.  Denies any cough no congestion at this time.  Still has some shortness of breath with exertion.  She is no chest pain noted.  Patient continues to take her medications she is out of her [facility] which I did not have any samples of so I gave her some samples of Symbicort and instructed her in how to use it  ROS  General: (-) fever, (-) chills, (-) night sweats, (-) weakness Skin: (-) rashes, (-) itching,. Eyes: (-) visual changes, (-) redness, (-) itching. Nose and Sinuses: (-) nasal stuffiness or itchiness, (-) postnasal drip, (-) nosebleeds, (-) sinus trouble. Mouth and Throat: (-) sore throat, (-) hoarseness. Neck: (-) swollen glands, (-) enlarged thyroid, (-) neck pain. Respiratory: - cough, (-) bloody sputum, + shortness of breath, - wheezing. Cardiovascular: - ankle swelling, (-) chest pain. Lymphatic: (-) lymph node enlargement. Neurologic: (-) numbness, (-) tingling. Psychiatric: (-) anxiety, (-) depression   Current Medication: Outpatient Encounter Medications as of 09/29/2018  Medication Sig  . arformoterol (BROVANA) 15 MCG/2ML NEBU Take 2 mLs (15 mcg total) by nebulization 2 (two) times daily.  Marland Kitchen aspirin EC 81 MG tablet Take 81 mg by mouth daily.  . blood glucose meter kit and supplies KIT Dispense based on patient and insurance preference. Use up to four times daily as directed. (FOR ICD-9 250.00, 250.01).  . Blood Glucose Monitoring Suppl (ONE TOUCH ULTRA 2) w/Device KIT 1 each by Does not apply route  daily.  . fluticasone (FLONASE) 50 MCG/ACT nasal spray Place 1 spray into both nostrils daily as needed.   . furosemide (LASIX) 20 MG tablet Take 1 tablet (20 mg total) by mouth daily.  Marland Kitchen gabapentin (NEURONTIN) 300 MG capsule 2 tablets in the morning, 1 tablet in the afternoon and 2 tablets in the evening  . glucose blood (ONE TOUCH ULTRA TEST) test strip USE 1 STRIP 2 TIMES DAILY AND AS NEEDED  . HYDROcodone-acetaminophen (NORCO) 10-325 MG tablet Take by mouth.  Marland Kitchen ipratropium-albuterol (DUONEB) 0.5-2.5 (3) MG/3ML SOLN INHALE 1 VIAL VIA NEBULIZER EVERY 6 HOURS AS NEEDED.  Marland Kitchen isosorbide mononitrate (IMDUR) 30 MG 24 hr tablet Take 30 mg by mouth daily as needed (high bp).  Marland Kitchen levothyroxine (SYNTHROID, LEVOTHROID) 125 MCG tablet TAKE 1 TABLET BY MOUTH DAILY  . lisinopril (PRINIVIL,ZESTRIL) 20 MG tablet Take 1 tablet (20 mg total) by mouth daily.  Marland Kitchen loratadine (CLARITIN) 10 MG tablet TAKE 1 TABLET (10 MG TOTAL) BY MOUTH DAILY.  Marland Kitchen LORazepam (ATIVAN) 0.5 MG tablet Take 1 tablet (0.5 mg total) by mouth 2 (two) times daily as needed for anxiety.  . metFORMIN (GLUCOPHAGE) 1000 MG tablet TAKE 1 TABLET BY MOUTH TWICE A DAY (Patient taking differently: Take 1,000 mg by mouth daily with breakfast. )  . mometasone-formoterol (DULERA) 100-5 MCG/ACT AERO Inhale 2 puffs into the lungs 2 (two) times daily.  . MULTIPLE VITAMIN PO Take 1 tablet by mouth daily.   Marland Kitchen omeprazole (PRILOSEC) 40 MG capsule Take 1 capsule (40 mg total) by mouth daily.  . ondansetron (ZOFRAN-ODT) 4 MG  disintegrating tablet TAKE 1 TABLET BY MOUTH EVERY 8 HOURS AS NEEDED FOR NAUSEA AND VOMITING  . oxyCODONE-acetaminophen (PERCOCET/ROXICET) 5-325 MG tablet Take 1 tablet by mouth every 6 (six) hours as needed for severe pain.  . OXYGEN Inhale 4 L into the lungs.  Marland Kitchen PARoxetine (PAXIL) 10 MG tablet Take 1 tablet (10 mg total) by mouth at bedtime.  . pravastatin (PRAVACHOL) 10 MG tablet TAKE 1 TABLET (10 MG TOTAL) BY MOUTH DAILY.  . roflumilast  (DALIRESP) 500 MCG TABS tablet Take by mouth.  . tiotropium (SPIRIVA) 18 MCG inhalation capsule Place into inhaler and inhale.   No facility-administered encounter medications on file as of 09/29/2018.     Surgical History: Past Surgical History:  Procedure Laterality Date  . ABDOMINAL HYSTERECTOMY    . BOWEL RESECTION N/A 04/14/2018   Procedure: SMALL BOWEL RESECTION;  Surgeon: Jules Husbands, MD;  Location: [facility] ORS;  Service: General;  Laterality: N/A;  . CATARACT EXTRACTION W/PHACO Left 07/03/2016   Procedure: CATARACT EXTRACTION PHACO AND INTRAOCULAR LENS PLACEMENT ([place]);  Surgeon: Birder Robson, MD;  Location: [facility] ORS;  Service: Ophthalmology;  Laterality: Left;  Lot: [id number] H [place]: 00:40.1 AP%: 17.4 CDE:6.94  . HERNIA REPAIR    . LAPAROTOMY N/A 09/04/2018   Procedure: EXPLORATORY LAPAROTOMY;  Surgeon: Olean Ree, MD;  Location: [facility] ORS;  Service: General;  Laterality: N/A;  . TUBAL LIGATION    . VENTRAL HERNIA REPAIR N/A 04/14/2018   Procedure: HERNIA REPAIR VENTRAL ADULT;  Surgeon: Jules Husbands, MD;  Location: [facility] ORS;  Service: General;  Laterality: N/A;    Medical History: Past Medical History:  Diagnosis Date  . Arthritis   . COPD (chronic obstructive pulmonary disease) ([place])   . Diabetes mellitus without complication ([place])   . Dysrhythmia   . Heart murmur   . History of orthopnea   . Hypertension   . Hypothyroidism   . Neuropathy   . Oxygen dependent    3L  CONTINUOUS  . Pain CHRONIC BACK PAIN  . Shortness of breath dyspnea   . Wheezing     Family History: Family History  Problem Relation Age of Onset  . Heart disease Mother   . Drug abuse Other   . Hypertension Other     Social History: Social History   Socioeconomic History  . Marital status: Married    Spouse name: Not on file  . Number of children: 4  . Years of education: Not on file  . Highest education level: 8th grade  Occupational History  . Occupation: retired  Scientific laboratory technician   . Financial resource strain: Not hard at all  . Food insecurity:    Worry: Never true    Inability: Never true  . Transportation needs:    Medical: No    Non-medical: No  Tobacco Use  . Smoking status: Former Smoker    Packs/day: 0.50    Years: 15.00    Pack years: 7.50  . Smokeless tobacco: Never Used  . Tobacco comment: 30-40 years ago  Substance and Sexual Activity  . Alcohol use: No    Alcohol/week: 0.0 standard drinks  . Drug use: No  . Sexual activity: Never  Lifestyle  . Physical activity:    Days per week: Not on file    Minutes per session: Not on file  . Stress: Not at all  Relationships  . Social connections:    Talks on phone: Not on file    Gets together: Not on file  Attends religious service: Not on file    Active member of club or organization: Not on file    Attends meetings of clubs or organizations: Not on file    Relationship status: Not on file  . Intimate partner violence:    Fear of current or ex partner: No    Emotionally abused: No    Physically abused: No    Forced sexual activity: No  Other Topics Concern  . Not on file  Social History Narrative  . Not on file    Vital Signs: Blood pressure (!) 162/88, pulse 85, height '5\' 6"'$  (1.676 m), weight 216 lb (98 kg), SpO2 99 %.  Examination: General Appearance: The patient is well-developed, well-nourished, and in no distress. Skin: Gross inspection of skin unremarkable. Head: normocephalic, no gross deformities. Eyes: no gross deformities noted. ENT: ears appear grossly normal no exudates. Neck: Supple. No thyromegaly. No LAD. Respiratory: no rhonchi noted at this time. Cardiovascular: Normal S1 and S2 without murmur or rub. Extremities: No cyanosis. pulses are equal. Neurologic: Alert and oriented. No involuntary movements.  LABS: Recent Results (from the past 2160 hour(s))  Lipase, blood     Status: None   Collection Time: 09/01/18  3:12 AM  Result Value Ref Range   Lipase 22 11  - 51 U/L    Comment: Performed at [facility], [address] [place], [place] [address]  Comprehensive metabolic panel     Status: Abnormal   Collection Time: 09/01/18  3:12 AM  Result Value Ref Range   Sodium 139 135 - 145 mmol/L   Potassium 4.5 3.5 - 5.1 mmol/L   Chloride 96 (L) 98 - 111 mmol/L   CO2 32 22 - 32 mmol/L   Glucose, Bld 122 (H) 70 - 99 mg/dL   BUN 25 (H) 8 - 23 mg/dL   Creatinine, Ser 0.84 0.44 - 1.00 mg/dL   Calcium 9.3 8.9 - 10.3 mg/dL   Total Protein 7.5 6.5 - 8.1 g/dL   Albumin 4.0 3.5 - 5.0 g/dL   AST 15 15 - 41 U/L   ALT 8 0 - 44 U/L   Alkaline Phosphatase 75 38 - 126 U/L   Total Bilirubin 0.6 0.3 - 1.2 mg/dL   GFR calc non Af Amer >60 >60 mL/min   GFR calc Af Amer >60 >60 mL/min    Comment: (NOTE) The eGFR has been calculated using the CKD EPI equation. This calculation has not been validated in all clinical situations. eGFR's persistently <60 mL/min signify possible Chronic Kidney Disease.    Anion gap 11 5 - 15    Comment: Performed at [facility], [address] [place], [place] [address]  CBC     Status: Abnormal   Collection Time: 09/01/18  3:12 AM  Result Value Ref Range   WBC 9.2 4.0 - 10.5 K/uL   RBC 3.94 3.87 - 5.11 MIL/uL   Hemoglobin 11.3 (L) 12.0 - 15.0 g/dL   HCT 37.9 36.0 - 46.0 %   MCV 96.2 80.0 - 100.0 fL   MCH 28.7 26.0 - 34.0 pg   MCHC 29.8 (L) 30.0 - 36.0 g/dL   RDW 13.5 11.5 - 15.5 %   Platelets 202 150 - 400 K/uL   nRBC 0.0 0.0 - 0.2 %    Comment: Performed at [facility], [address], [place], [place] [address]  Troponin I     Status: None   Collection Time: 09/01/18  3:12 AM  Result  Value Ref Range   Troponin I <0.03 <0.03 ng/mL    Comment: Performed at [facility], [address] [place], [place] [address]  Urinalysis, Complete w Microscopic     Status: Abnormal   Collection Time: 09/01/18  3:13 AM  Result Value Ref Range   Color, Urine YELLOW (A) YELLOW   APPearance  CLEAR (A) CLEAR   Specific Gravity, Urine 1.009 1.005 - 1.030   pH 6.0 5.0 - 8.0   Glucose, UA NEGATIVE NEGATIVE mg/dL   Hgb urine dipstick NEGATIVE NEGATIVE   Bilirubin Urine NEGATIVE NEGATIVE   Ketones, ur NEGATIVE NEGATIVE mg/dL   Protein, ur NEGATIVE NEGATIVE mg/dL   Nitrite NEGATIVE NEGATIVE   Leukocytes, UA NEGATIVE NEGATIVE   RBC / HPF 0-5 0 - 5 RBC/hpf   WBC, UA 0-5 0 - 5 WBC/hpf   Bacteria, UA NONE SEEN NONE SEEN   Squamous Epithelial / LPF 0-5 0 - 5   Hyaline Casts, UA PRESENT     Comment: Performed at [facility], [address] [place], [place] [address]  Glucose, capillary     Status: Abnormal   Collection Time: 09/01/18  7:56 AM  Result Value Ref Range   Glucose-Capillary 135 (H) 70 - 99 mg/dL   Comment 1 Notify RN   Glucose, capillary     Status: None   Collection Time: 09/01/18 12:12 PM  Result Value Ref Range   Glucose-Capillary 94 70 - 99 mg/dL   Comment 1 Notify RN   Glucose, capillary     Status: None   Collection Time: 09/01/18  4:45 PM  Result Value Ref Range   Glucose-Capillary 85 70 - 99 mg/dL   Comment 1 Notify RN   Glucose, capillary     Status: None   Collection Time: 09/01/18  8:12 PM  Result Value Ref Range   Glucose-Capillary 99 70 - 99 mg/dL  Glucose, capillary     Status: None   Collection Time: 09/02/18 12:16 AM  Result Value Ref Range   Glucose-Capillary 92 70 - 99 mg/dL  Basic metabolic panel     Status: Abnormal   Collection Time: 09/02/18  3:38 AM  Result Value Ref Range   Sodium 142 135 - 145 mmol/L   Potassium 5.0 3.5 - 5.1 mmol/L   Chloride 102 98 - 111 mmol/L   CO2 36 (H) 22 - 32 mmol/L   Glucose, Bld 97 70 - 99 mg/dL   BUN 21 8 - 23 mg/dL   Creatinine, Ser 0.66 0.44 - 1.00 mg/dL   Calcium 9.1 8.9 - 10.3 mg/dL   GFR calc non Af Amer >60 >60 mL/min   GFR calc Af Amer >60 >60 mL/min    Comment: (NOTE) The eGFR has been calculated using the CKD EPI equation. This calculation has not been validated in all clinical  situations. eGFR's persistently <60 mL/min signify possible Chronic Kidney Disease.    Anion gap 4 (L) 5 - 15    Comment: Performed at [facility], [address] [place], [place] [address]  Magnesium     Status: None   Collection Time: 09/02/18  3:38 AM  Result Value Ref Range   Magnesium 2.4 1.7 - 2.4 mg/dL    Comment: Performed at [facility], [address] [place], [place] [address]  CBC WITH DIFFERENTIAL     Status: Abnormal   Collection Time: 09/02/18  3:38 AM  Result Value Ref Range   WBC 7.5 4.0 - 10.5  K/uL   RBC 3.58 (L) 3.87 - 5.11 MIL/uL   Hemoglobin 10.3 (L) 12.0 - 15.0 g/dL   HCT 34.8 (L) 36.0 - 46.0 %   MCV 97.2 80.0 - 100.0 fL   MCH 28.8 26.0 - 34.0 pg   MCHC 29.6 (L) 30.0 - 36.0 g/dL   RDW 13.4 11.5 - 15.5 %   Platelets 167 150 - 400 K/uL   nRBC 0.0 0.0 - 0.2 %   Neutrophils Relative % 70 %   Neutro Abs 5.3 1.7 - 7.7 K/uL   Lymphocytes Relative 18 %   Lymphs Abs 1.4 0.7 - 4.0 K/uL   Monocytes Relative 9 %   Monocytes Absolute 0.7 0.1 - 1.0 K/uL   Eosinophils Relative 3 %   Eosinophils Absolute 0.2 0.0 - 0.5 K/uL   Basophils Relative 0 %   Basophils Absolute 0.0 0.0 - 0.1 K/uL   Immature Granulocytes 0 %   Abs Immature Granulocytes 0.03 0.00 - 0.07 K/uL    Comment: Performed at [facility], [address] [place], [place] [address]  Glucose, capillary     Status: Abnormal   Collection Time: 09/02/18  4:15 AM  Result Value Ref Range   Glucose-Capillary 100 (H) 70 - 99 mg/dL  Glucose, capillary     Status: None   Collection Time: 09/02/18  7:50 AM  Result Value Ref Range   Glucose-Capillary 89 70 - 99 mg/dL   Comment 1 Notify RN   Glucose, capillary     Status: None   Collection Time: 09/02/18 11:57 AM  Result Value Ref Range   Glucose-Capillary 87 70 - 99 mg/dL   Comment 1 Notify RN   Glucose, capillary     Status: None   Collection Time: 09/02/18  4:33 PM  Result Value Ref Range   Glucose-Capillary 71 70 - 99  mg/dL   Comment 1 Notify RN   Glucose, capillary     Status: None   Collection Time: 09/02/18  8:56 PM  Result Value Ref Range   Glucose-Capillary 90 70 - 99 mg/dL  Glucose, capillary     Status: Abnormal   Collection Time: 09/03/18 12:20 AM  Result Value Ref Range   Glucose-Capillary 107 (H) 70 - 99 mg/dL  Glucose, capillary     Status: None   Collection Time: 09/03/18  4:14 AM  Result Value Ref Range   Glucose-Capillary 93 70 - 99 mg/dL  Glucose, capillary     Status: None   Collection Time: 09/03/18  7:40 AM  Result Value Ref Range   Glucose-Capillary 86 70 - 99 mg/dL   Comment 1 Notify RN   CBC with Differential/Platelet     Status: Abnormal   Collection Time: 09/03/18  8:49 AM  Result Value Ref Range   WBC 7.2 4.0 - 10.5 K/uL   RBC 3.81 (L) 3.87 - 5.11 MIL/uL   Hemoglobin 11.0 (L) 12.0 - 15.0 g/dL   HCT 36.0 36.0 - 46.0 %   MCV 94.5 80.0 - 100.0 fL   MCH 28.9 26.0 - 34.0 pg   MCHC 30.6 30.0 - 36.0 g/dL   RDW 13.0 11.5 - 15.5 %   Platelets 170 150 - 400 K/uL   nRBC 0.0 0.0 - 0.2 %   Neutrophils Relative % 72 %   Neutro Abs 5.2 1.7 - 7.7 K/uL   Lymphocytes Relative 15 %   Lymphs Abs 1.1 0.7 - 4.0 K/uL   Monocytes Relative 9 %   Monocytes Absolute  0.6 0.1 - 1.0 K/uL   Eosinophils Relative 3 %   Eosinophils Absolute 0.2 0.0 - 0.5 K/uL   Basophils Relative 0 %   Basophils Absolute 0.0 0.0 - 0.1 K/uL   Immature Granulocytes 1 %   Abs Immature Granulocytes 0.04 0.00 - 0.07 K/uL    Comment: Performed at [facility], [address] [place], [place] [address]  Basic metabolic panel     Status: Abnormal   Collection Time: 09/03/18  8:49 AM  Result Value Ref Range   Sodium 142 135 - 145 mmol/L   Potassium 4.2 3.5 - 5.1 mmol/L   Chloride 102 98 - 111 mmol/L   CO2 33 (H) 22 - 32 mmol/L   Glucose, Bld 108 (H) 70 - 99 mg/dL   BUN 19 8 - 23 mg/dL   Creatinine, Ser 0.59 0.44 - 1.00 mg/dL   Calcium 9.0 8.9 - 10.3 mg/dL   GFR calc non Af Amer >60 >60 mL/min   GFR  calc Af Amer >60 >60 mL/min    Comment: (NOTE) The eGFR has been calculated using the CKD EPI equation. This calculation has not been validated in all clinical situations. eGFR's persistently <60 mL/min signify possible Chronic Kidney Disease.    Anion gap 7 5 - 15    Comment: Performed at [facility], [address] [place], [place] [address]  Magnesium     Status: None   Collection Time: 09/03/18  8:49 AM  Result Value Ref Range   Magnesium 1.9 1.7 - 2.4 mg/dL    Comment: Performed at [facility], [address] [place], [place] [address]  Glucose, capillary     Status: None   Collection Time: 09/03/18 11:52 AM  Result Value Ref Range   Glucose-Capillary 98 70 - 99 mg/dL   Comment 1 Notify RN   Glucose, capillary     Status: Abnormal   Collection Time: 09/03/18  4:33 PM  Result Value Ref Range   Glucose-Capillary 102 (H) 70 - 99 mg/dL   Comment 1 Notify RN   Glucose, capillary     Status: Abnormal   Collection Time: 09/03/18  9:14 PM  Result Value Ref Range   Glucose-Capillary 134 (H) 70 - 99 mg/dL  Glucose, capillary     Status: None   Collection Time: 09/04/18  8:04 AM  Result Value Ref Range   Glucose-Capillary 89 70 - 99 mg/dL   Comment 1 Notify RN   Glucose, capillary     Status: None   Collection Time: 09/04/18 11:37 AM  Result Value Ref Range   Glucose-Capillary 83 70 - 99 mg/dL   Comment 1 Notify RN   Glucose, capillary     Status: Abnormal   Collection Time: 09/04/18  2:40 PM  Result Value Ref Range   Glucose-Capillary 105 (H) 70 - 99 mg/dL  Glucose, capillary     Status: Abnormal   Collection Time: 09/04/18  6:08 PM  Result Value Ref Range   Glucose-Capillary 140 (H) 70 - 99 mg/dL  Glucose, capillary     Status: Abnormal   Collection Time: 09/04/18 10:10 PM  Result Value Ref Range   Glucose-Capillary 124 (H) 70 - 99 mg/dL   Comment 1 Notify RN   Glucose, capillary     Status: None   Collection Time: 09/05/18  7:28 AM  Result  Value Ref Range   Glucose-Capillary 90 70 - 99 mg/dL  CBC with Differential/Platelet     Status: Abnormal  Collection Time: 09/05/18  8:14 AM  Result Value Ref Range   WBC 12.3 (H) 4.0 - 10.5 K/uL   RBC 3.77 (L) 3.87 - 5.11 MIL/uL   Hemoglobin 10.9 (L) 12.0 - 15.0 g/dL   HCT 36.1 36.0 - 46.0 %   MCV 95.8 80.0 - 100.0 fL   MCH 28.9 26.0 - 34.0 pg   MCHC 30.2 30.0 - 36.0 g/dL   RDW 13.3 11.5 - 15.5 %   Platelets 193 150 - 400 K/uL   nRBC 0.0 0.0 - 0.2 %   Neutrophils Relative % 80 %   Neutro Abs 9.8 (H) 1.7 - 7.7 K/uL   Lymphocytes Relative 10 %   Lymphs Abs 1.2 0.7 - 4.0 K/uL   Monocytes Relative 9 %   Monocytes Absolute 1.2 (H) 0.1 - 1.0 K/uL   Eosinophils Relative 0 %   Eosinophils Absolute 0.0 0.0 - 0.5 K/uL   Basophils Relative 0 %   Basophils Absolute 0.0 0.0 - 0.1 K/uL   Immature Granulocytes 1 %   Abs Immature Granulocytes 0.07 0.00 - 0.07 K/uL    Comment: Performed at [facility], [address] [place], [place] [address]  Basic metabolic panel     Status: Abnormal   Collection Time: 09/05/18  8:14 AM  Result Value Ref Range   Sodium 140 135 - 145 mmol/L   Potassium 4.6 3.5 - 5.1 mmol/L   Chloride 105 98 - 111 mmol/L   CO2 29 22 - 32 mmol/L   Glucose, Bld 96 70 - 99 mg/dL   BUN 10 8 - 23 mg/dL   Creatinine, Ser 0.71 0.44 - 1.00 mg/dL   Calcium 7.9 (L) 8.9 - 10.3 mg/dL   GFR calc non Af Amer >60 >60 mL/min   GFR calc Af Amer >60 >60 mL/min    Comment: (NOTE) The eGFR has been calculated using the CKD EPI equation. This calculation has not been validated in all clinical situations. eGFR's persistently <60 mL/min signify possible Chronic Kidney Disease.    Anion gap 6 5 - 15    Comment: Performed at [facility], [address] [place], [place] [address]  Magnesium     Status: None   Collection Time: 09/05/18  8:14 AM  Result Value Ref Range   Magnesium 1.7 1.7 - 2.4 mg/dL    Comment: Performed at [facility], [address] [place],  [address]  Glucose, capillary     Status: None   Collection Time: 09/05/18 12:07 PM  Result Value Ref Range   Glucose-Capillary 98 70 - 99 mg/dL  Glucose, capillary     Status: None   Collection Time: 09/05/18  5:53 PM  Result Value Ref Range   Glucose-Capillary 92 70 - 99 mg/dL  Glucose, capillary     Status: None   Collection Time: 09/05/18 11:27 PM  Result Value Ref Range   Glucose-Capillary 98 70 - 99 mg/dL   Comment 1 Notify RN   Basic metabolic panel     Status: Abnormal   Collection Time: 09/06/18  4:22 AM  Result Value Ref Range   Sodium 142 135 - 145 mmol/L   Potassium 4.0 3.5 - 5.1 mmol/L   Chloride 108 98 - 111 mmol/L   CO2 30 22 - 32 mmol/L   Glucose, Bld 117 (H) 70 - 99 mg/dL   BUN 11 8 - 23 mg/dL   Creatinine, Ser 0.59 0.44 - 1.00 mg/dL   Calcium 7.8 (L)  8.9 - 10.3 mg/dL   GFR calc non Af Amer >60 >60 mL/min   GFR calc Af Amer >60 >60 mL/min    Comment: (NOTE) The eGFR has been calculated using the CKD EPI equation. This calculation has not been validated in all clinical situations. eGFR's persistently <60 mL/min signify possible Chronic Kidney Disease.    Anion gap 4 (L) 5 - 15    Comment: Performed at [facility], [address] [place], [place] [address]  Prealbumin     Status: Abnormal   Collection Time: 09/06/18  4:24 AM  Result Value Ref Range   Prealbumin 11.1 (L) 18 - 38 mg/dL    Comment: Performed at [facility] [address], [place], [place] [address]  Magnesium     Status: None   Collection Time: 09/06/18  4:24 AM  Result Value Ref Range   Magnesium 1.9 1.7 - 2.4 mg/dL    Comment: Performed at [facility], [address] [place], [place] [address]  Phosphorus     Status: None   Collection Time: 09/06/18  4:24 AM  Result Value Ref Range   Phosphorus 2.6 2.5 - 4.6 mg/dL    Comment: Performed at [facility], [address] [place], [place] [address]  Triglycerides     Status:  Abnormal   Collection Time: 09/06/18  4:24 AM  Result Value Ref Range   Triglycerides 193 (H) <150 mg/dL    Comment: Performed at [facility], [address] [place],  [address]  Glucose, capillary     Status: Abnormal   Collection Time: 09/06/18  6:28 AM  Result Value Ref Range   Glucose-Capillary 109 (H) 70 - 99 mg/dL  Glucose, capillary     Status: Abnormal   Collection Time: 09/06/18 11:52 AM  Result Value Ref Range   Glucose-Capillary 125 (H) 70 - 99 mg/dL   Comment 1 Notify RN   Glucose, capillary     Status: None   Collection Time: 09/06/18  5:27 PM  Result Value Ref Range   Glucose-Capillary 87 70 - 99 mg/dL  Glucose, capillary     Status: Abnormal   Collection Time: 09/07/18 12:02 AM  Result Value Ref Range   Glucose-Capillary 122 (H) 70 - 99 mg/dL   Comment 1 Notify RN   Glucose, capillary     Status: Abnormal   Collection Time: 09/07/18  6:01 AM  Result Value Ref Range   Glucose-Capillary 110 (H) 70 - 99 mg/dL   Comment 1 Notify RN   Basic metabolic panel     Status: Abnormal   Collection Time: 09/07/18  6:21 AM  Result Value Ref Range   Sodium 142 135 - 145 mmol/L   Potassium 4.0 3.5 - 5.1 mmol/L   Chloride 105 98 - 111 mmol/L   CO2 32 22 - 32 mmol/L   Glucose, Bld 126 (H) 70 - 99 mg/dL   BUN 14 8 - 23 mg/dL   Creatinine, Ser 0.57 0.44 - 1.00 mg/dL   Calcium 8.3 (L) 8.9 - 10.3 mg/dL   GFR calc non Af Amer >60 >60 mL/min   GFR calc Af Amer >60 >60 mL/min    Comment: (NOTE) The eGFR has been calculated using the CKD EPI equation. This calculation has not been validated in all clinical situations. eGFR's persistently <60 mL/min signify possible Chronic Kidney Disease.    Anion gap 5 5 - 15    Comment: Performed at [facility],  [place], [place] [address]  Magnesium     Status: None   Collection Time: 09/07/18  6:21 AM  Result Value Ref Range   Magnesium 1.9 1.7 - 2.4 mg/dL    Comment: Performed at [facility], [address] [place], [place] [address]  Phosphorus     Status: None   Collection Time: 09/07/18  6:21 AM  Result Value Ref Range   Phosphorus 2.8 2.5 - 4.6 mg/dL    Comment: Performed at [facility], [address] [place], [place] [address]  Glucose, capillary     Status: Abnormal   Collection Time: 09/07/18 11:33 AM  Result Value Ref Range   Glucose-Capillary 117 (H) 70 - 99 mg/dL  Glucose, capillary     Status: Abnormal   Collection Time: 09/07/18  6:24 PM  Result Value Ref Range   Glucose-Capillary 113 (H) 70 - 99 mg/dL  Glucose, capillary     Status: Abnormal   Collection Time: 09/07/18 11:58 PM  Result Value Ref Range   Glucose-Capillary 116 (H) 70 - 99 mg/dL   Comment 1 Notify RN   Glucose, capillary     Status: Abnormal   Collection Time: 09/08/18  4:16 AM  Result Value Ref Range   Glucose-Capillary 124 (H) 70 - 99 mg/dL   Comment 1 Notify RN   Triglycerides     Status: Abnormal   Collection Time: 09/08/18  5:00 AM  Result Value Ref Range   Triglycerides 210 (H) <150 mg/dL    Comment: Performed at [facility], [address] [place], [place] [address]  Comprehensive metabolic panel     Status: Abnormal   Collection Time: 09/08/18  5:01 AM  Result Value Ref Range   Sodium 141 135 - 145 mmol/L   Potassium 3.8 3.5 - 5.1 mmol/L   Chloride 103 98 - 111 mmol/L   CO2 32 22 - 32 mmol/L   Glucose, Bld 120 (H) 70 - 99 mg/dL   BUN 16 8 - 23 mg/dL   Creatinine, Ser 0.53 0.44 - 1.00 mg/dL   Calcium 8.3 (L) 8.9 - 10.3 mg/dL   Total Protein 5.9 (L) 6.5 - 8.1 g/dL   Albumin 2.8 (L) 3.5 - 5.0 g/dL   AST 18 15 - 41 U/L   ALT 8 0 - 44 U/L   Alkaline Phosphatase 52 38 - 126 U/L   Total Bilirubin 0.4 0.3 - 1.2 mg/dL   GFR calc non Af Amer >60 >60 mL/min   GFR calc Af Amer >60 >60 mL/min    Comment: (NOTE) The eGFR has been calculated using the CKD EPI equation. This calculation has not been validated in all clinical situations. eGFR's  persistently <60 mL/min signify possible Chronic Kidney Disease.    Anion gap 6 5 - 15    Comment: Performed at [facility], [address] [place], [place] [address]  Magnesium     Status: None   Collection Time: 09/08/18  5:01 AM  Result Value Ref Range   Magnesium 1.9 1.7 - 2.4 mg/dL    Comment: Performed at [facility], [address] [place], [place] [address]  Phosphorus     Status: None   Collection Time: 09/08/18  5:01 AM  Result Value Ref Range   Phosphorus 3.1 2.5 - 4.6 mg/dL    Comment: Performed at [facility], [address] [place], [place] [address]  CBC     Status: Abnormal  Collection Time: 09/08/18  5:01 AM  Result Value Ref Range   WBC 6.5 4.0 - 10.5 K/uL   RBC 3.29 (L) 3.87 - 5.11 MIL/uL   Hemoglobin 9.5 (L) 12.0 - 15.0 g/dL   HCT 31.7 (L) 36.0 - 46.0 %   MCV 96.4 80.0 - 100.0 fL   MCH 28.9 26.0 - 34.0 pg   MCHC 30.0 30.0 - 36.0 g/dL   RDW 13.0 11.5 - 15.5 %   Platelets 182 150 - 400 K/uL   nRBC 0.0 0.0 - 0.2 %    Comment: Performed at [facility], [address] [place], [place] [address]  Differential     Status: None   Collection Time: 09/08/18  5:01 AM  Result Value Ref Range   Neutrophils Relative % 69 %   Neutro Abs 4.5 1.7 - 7.7 K/uL   Lymphocytes Relative 16 %   Lymphs Abs 1.0 0.7 - 4.0 K/uL   Monocytes Relative 10 %   Monocytes Absolute 0.7 0.1 - 1.0 K/uL   Eosinophils Relative 4 %   Eosinophils Absolute 0.3 0.0 - 0.5 K/uL   Basophils Relative 0 %   Basophils Absolute 0.0 0.0 - 0.1 K/uL   Immature Granulocytes 1 %   Abs Immature Granulocytes 0.04 0.00 - 0.07 K/uL    Comment: Performed at [facility], [address] [place], [place] [address]  Prealbumin     Status: Abnormal   Collection Time: 09/08/18  5:01 AM  Result Value Ref Range   Prealbumin 11.0 (L) 18 - 38 mg/dL    Comment: Performed at [facility], [place] [place], [place] [address]  Glucose, capillary      Status: Abnormal   Collection Time: 09/08/18  6:20 AM  Result Value Ref Range   Glucose-Capillary 133 (H) 70 - 99 mg/dL  Glucose, capillary     Status: Abnormal   Collection Time: 09/08/18 11:39 AM  Result Value Ref Range   Glucose-Capillary 114 (H) 70 - 99 mg/dL  Glucose, capillary     Status: Abnormal   Collection Time: 09/08/18  5:40 PM  Result Value Ref Range   Glucose-Capillary 114 (H) 70 - 99 mg/dL   Comment 1 Notify RN   Glucose, capillary     Status: Abnormal   Collection Time: 09/09/18 12:38 AM  Result Value Ref Range   Glucose-Capillary 126 (H) 70 - 99 mg/dL   Comment 1 Notify RN   Glucose, capillary     Status: Abnormal   Collection Time: 09/09/18  5:25 AM  Result Value Ref Range   Glucose-Capillary 119 (H) 70 - 99 mg/dL  Glucose, capillary     Status: Abnormal   Collection Time: 09/09/18 11:32 AM  Result Value Ref Range   Glucose-Capillary 104 (H) 70 - 99 mg/dL  Glucose, capillary     Status: None   Collection Time: 09/09/18  5:19 PM  Result Value Ref Range   Glucose-Capillary 97 70 - 99 mg/dL   Comment 1 Notify RN   Glucose, capillary     Status: Abnormal   Collection Time: 09/09/18 11:29 PM  Result Value Ref Range   Glucose-Capillary 126 (H) 70 - 99 mg/dL  Glucose, capillary     Status: Abnormal   Collection Time: 09/10/18  5:44 AM  Result Value Ref Range   Glucose-Capillary 122 (H) 70 - 99 mg/dL  Glucose, capillary     Status: Abnormal   Collection Time: 09/10/18 11:47 AM  Result  Value Ref Range   Glucose-Capillary 137 (H) 70 - 99 mg/dL   Comment 1 Notify RN   Glucose, capillary     Status: Abnormal   Collection Time: 09/10/18  4:48 PM  Result Value Ref Range   Glucose-Capillary 126 (H) 70 - 99 mg/dL   Comment 1 Notify RN   Glucose, capillary     Status: Abnormal   Collection Time: 09/10/18  6:40 PM  Result Value Ref Range   Glucose-Capillary 120 (H) 70 - 99 mg/dL  Glucose, capillary     Status: Abnormal   Collection Time: 09/10/18 11:39 PM   Result Value Ref Range   Glucose-Capillary 122 (H) 70 - 99 mg/dL  Glucose, capillary     Status: Abnormal   Collection Time: 09/11/18  5:09 AM  Result Value Ref Range   Glucose-Capillary 123 (H) 70 - 99 mg/dL  Comprehensive metabolic panel     Status: Abnormal   Collection Time: 09/11/18  5:39 AM  Result Value Ref Range   Sodium 143 135 - 145 mmol/L   Potassium 4.3 3.5 - 5.1 mmol/L   Chloride 103 98 - 111 mmol/L   CO2 36 (H) 22 - 32 mmol/L   Glucose, Bld 133 (H) 70 - 99 mg/dL   BUN 15 8 - 23 mg/dL   Creatinine, Ser 0.52 0.44 - 1.00 mg/dL   Calcium 8.8 (L) 8.9 - 10.3 mg/dL   Total Protein 6.0 (L) 6.5 - 8.1 g/dL   Albumin 2.9 (L) 3.5 - 5.0 g/dL   AST 25 15 - 41 U/L   ALT 21 0 - 44 U/L   Alkaline Phosphatase 62 38 - 126 U/L   Total Bilirubin 0.3 0.3 - 1.2 mg/dL   GFR calc non Af Amer >60 >60 mL/min   GFR calc Af Amer >60 >60 mL/min    Comment: (NOTE) The eGFR has been calculated using the CKD EPI equation. This calculation has not been validated in all clinical situations. eGFR's persistently <60 mL/min signify possible Chronic Kidney Disease.    Anion gap 4 (L) 5 - 15    Comment: Performed at [facility], [address] [place], [place] [address]  Magnesium     Status: None   Collection Time: 09/11/18  5:39 AM  Result Value Ref Range   Magnesium 1.9 1.7 - 2.4 mg/dL    Comment: Performed at [facility], [address] [place], [place] [address]  Phosphorus     Status: None   Collection Time: 09/11/18  5:39 AM  Result Value Ref Range   Phosphorus 3.2 2.5 - 4.6 mg/dL    Comment: Performed at [facility], [address] [place], [place] [address]  POCT glycosylated hemoglobin (Hb A1C)     Status: Abnormal   Collection Time: 09/24/18 10:33 AM  Result Value Ref Range   Hemoglobin A1C 6.4 (A) 4.0 - 5.6 %   HbA1c POC (<> result, manual entry)     HbA1c, POC (prediabetic range)     HbA1c, POC (controlled diabetic range)    CBC with  Differential/Platelet     Status: Abnormal   Collection Time: 09/24/18 11:21 AM  Result Value Ref Range   WBC 7.1 3.4 - 10.8 x10E3/uL   RBC 3.40 (L) 3.77 - 5.28 x10E6/uL   Hemoglobin 9.6 (L) 11.1 - 15.9 g/dL   Hematocrit 30.3 (L) 34.0 - 46.6 %   MCV 89 79 - 97 fL   MCH 28.2 26.6 - 33.0 pg  MCHC 31.7 31.5 - 35.7 g/dL   RDW 12.3 12.3 - 15.4 %   Platelets 261 150 - 450 x10E3/uL   Neutrophils 69 Not Estab. %   Lymphs 19 Not Estab. %   Monocytes 8 Not Estab. %   Eos 4 Not Estab. %   Basos 0 Not Estab. %   Neutrophils Absolute 4.9 1.4 - 7.0 x10E3/uL   Lymphocytes Absolute 1.3 0.7 - 3.1 x10E3/uL   Monocytes Absolute 0.6 0.1 - 0.9 x10E3/uL   EOS (ABSOLUTE) 0.3 0.0 - 0.4 x10E3/uL   Basophils Absolute 0.0 0.0 - 0.2 x10E3/uL   Immature Granulocytes 0 Not Estab. %   Immature Grans (Abs) 0.0 0.0 - 0.1 x10E3/uL  Renal function panel     Status: Abnormal   Collection Time: 09/24/18 11:21 AM  Result Value Ref Range   Glucose 78 65 - 99 mg/dL   BUN 14 8 - 27 mg/dL   Creatinine, Ser 0.55 (L) 0.57 - 1.00 mg/dL   GFR calc non Af Amer 91 >59 mL/min/1.73   GFR calc Af Amer 105 >59 mL/min/1.73   BUN/Creatinine Ratio 25 12 - 28   Sodium 145 (H) 134 - 144 mmol/L   Potassium 4.2 3.5 - 5.2 mmol/L   Chloride 99 96 - 106 mmol/L   CO2 31 (H) 20 - 29 mmol/L   Calcium 9.2 8.7 - 10.3 mg/dL   Phosphorus 3.0 2.5 - 4.5 mg/dL   Albumin 3.9 3.5 - 4.8 g/dL    Radiology: Dg Abd 1 View  Result Date: 09/02/2018 CLINICAL DATA:  77 year old female with small bowel obstruction. Hernia repair bowel resection June 2019. Subsequent encounter. EXAM: ABDOMEN - 1 VIEW COMPARISON:  09/01/2018 FINDINGS: Nasogastric tube tip gastric antrum level with side hole gastric body level. Persistent gas distended small bowel loop left aspect measuring up to 6 cm with slightly thickened folds (measured up to 4.7 cm on CT). This suggests persistent/progressive small bowel obstruction. Right base atelectasis suspected. Free air below  right hemidiaphragm can not be excluded. Left greater right hip joint degenerative changes. Degenerative changes lumbar spine. IMPRESSION: 1. Right base atelectasis suspected. Free air below right hemidiaphragm cannot be not excluded. Upright chest x-ray or decubitus view of the abdomen (right-side-up) recommended for further delineation. 2. Persistent gas distended small bowel loop left aspect measuring up to 6 cm with slightly thickened folds (measured up to 4.7 cm on CT). This suggests persistent/progressive small bowel obstruction. 3. Nasogastric tube tip gastric antrum level with side hole gastric body level. Electronically Signed   By: Genia Del M.D.   On: 09/02/2018 09:23   Dg Abd 1 View  Result Date: 09/01/2018 CLINICAL DATA:  Status post nasogastric tube placement for small bowel obstruction. EXAM: ABDOMEN - 1 VIEW COMPARISON:  Abdominal radiographs of April 25, 2018 and abdominal CT scan of September 01, 2018 FINDINGS: The nasogastric tube tip and proximal port project in the mid gastric body. There are loops of mildly distended gas-filled small bowel in the upper abdomen. There are patchy airspace opacities in the right infrahilar region. IMPRESSION: Adequate placement of the esophagogastric tube. Persistent mild distention of multiple small bowel loops consistent with small bowel obstruction or ileus. Electronically Signed   By: David  Martinique M.D.   On: 09/01/2018 12:22   Ct Abdomen Pelvis W Contrast  Result Date: 09/01/2018 CLINICAL DATA:  77 y/o  F; right mid and lower abdominal pain. EXAM: CT ABDOMEN AND PELVIS WITH CONTRAST TECHNIQUE: Multidetector CT imaging of the abdomen and  pelvis was performed using the standard protocol following bolus administration of intravenous contrast. CONTRAST:  119m ISOVUE-300 IOPAMIDOL (ISOVUE-300) INJECTION 61%, <See Chart> OMNIPAQUE IOHEXOL 300 MG/ML SOLN COMPARISON:  04/21/2018 CT abdomen and pelvis. FINDINGS: Lower chest: Stable bilateral lower lobe  bronchiectasis. Hepatobiliary: Punctate calcification within the mid liver is stable compatible sequelae of prior infectious or inflammatory process. No focal liver abnormality is seen. Cholelithiasis. No gallbladder wall thickening. No biliary ductal dilatation. Pancreas: Unremarkable. No pancreatic ductal dilatation or surrounding inflammatory changes. Spleen: Normal in size without focal abnormality. Adrenals/Urinary Tract: Adrenal glands are unremarkable. Stable 12 mm cyst within the right interpolar kidney. Otherwise, kidneys are normal, without renal calculi, focal lesion, or hydronephrosis. Bladder is unremarkable. Stomach/Bowel: Small bowel anastomosis in the left lower quadrant. Focally dilated loops of small bowel in the left hemiabdomen compatible small bowel obstruction possibly related to internal hernia or adhesion. No bowel wall thickening or findings of perforation. Appendix not identified, no pericecal inflammation. Vascular/Lymphatic: Aortic atherosclerosis. No enlarged abdominal or pelvic lymph nodes. Reproductive: Prostate is unremarkable. Other: Mid ventral abdominal hernia containing fat. Musculoskeletal: No fracture is seen. 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 Advanced discogenic degenerative changes of the lumbar spine with loss of intervertebral disc space height and vacuum phenomenon. IMPRESSION: 1. Focally dilated loops of small bowel in the left hemiabdomen compatible with small bowel obstruction possibly related to internal hernia or adhesion. No bowel wall thickening or findings of perforation. 2. Cholelithiasis. 3.  Aortic Atherosclerosis (ICD10-I70.0). 4. Stable bilateral lower lobe bronchiectasis. Electronically Signed   By: LKristine GarbeM.D.   On: 09/01/2018 04:58    No results found.  Dg Abd 1 View  Result Date: 09/04/2018 CLINICAL DATA:  Follow-up ileus EXAM: ABDOMEN - 1 VIEW COMPARISON:   09/02/2018 FINDINGS: Nasogastric catheter has been removed in the interval. Contrast material is noted within the colon from prior CT examination. Scattered large and small bowel gas is noted. A few mildly prominent loops of small bowel are noted in the left mid abdomen no definitive free air is seen. No bony abnormality is noted. IMPRESSION: Mildly prominent left mid abdominal small bowel loops. No other focal abnormality is noted. Electronically Signed   By: MInez CatalinaM.D.   On: 09/04/2018 09:20   Dg Abd 1 View  Result Date: 09/02/2018 CLINICAL DATA:  77year old female with small bowel obstruction. Hernia repair bowel resection June 2019. Subsequent encounter. EXAM: ABDOMEN - 1 VIEW COMPARISON:  09/01/2018 FINDINGS: Nasogastric tube tip gastric antrum level with side hole gastric body level. Persistent gas distended small bowel loop left aspect measuring up to 6 cm with slightly thickened folds (measured up to 4.7 cm on CT). This suggests persistent/progressive small bowel obstruction. Right base atelectasis suspected. Free air below right hemidiaphragm can not be excluded. Left greater right hip joint degenerative changes. Degenerative changes lumbar spine. IMPRESSION: 1. Right base atelectasis suspected. Free air below right hemidiaphragm cannot be not excluded. Upright chest x-ray or decubitus view of the abdomen (right-side-up) recommended for further delineation. 2. Persistent gas distended small bowel loop left aspect measuring up to 6 cm with slightly thickened folds (measured up to 4.7 cm on CT). This suggests persistent/progressive small bowel obstruction. 3. Nasogastric tube tip gastric antrum level with side hole gastric body level. Electronically Signed   By: SGenia DelM.D.   On: 09/02/2018 09:23   Dg Abd 1 View  Result Date: 09/01/2018 CLINICAL DATA:  Status post nasogastric tube  placement for small bowel obstruction. EXAM: ABDOMEN - 1 VIEW COMPARISON:  Abdominal radiographs of April 25, 2018 and abdominal CT scan of September 01, 2018 FINDINGS: The nasogastric tube tip and proximal port project in the mid gastric body. There are loops of mildly distended gas-filled small bowel in the upper abdomen. There are patchy airspace opacities in the right infrahilar region. IMPRESSION: Adequate placement of the esophagogastric tube. Persistent mild distention of multiple small bowel loops consistent with small bowel obstruction or ileus. Electronically Signed   By: David  Martinique M.D.   On: 09/01/2018 12:22   Ct Abdomen Pelvis W Contrast  Result Date: 09/01/2018 CLINICAL DATA:  77 y/o  F; right mid and lower abdominal pain. EXAM: CT ABDOMEN AND PELVIS WITH CONTRAST TECHNIQUE: Multidetector CT imaging of the abdomen and pelvis was performed using the standard protocol following bolus administration of intravenous contrast. CONTRAST:  139m ISOVUE-300 IOPAMIDOL (ISOVUE-300) INJECTION 61%, <See Chart> OMNIPAQUE IOHEXOL 300 MG/ML SOLN COMPARISON:  04/21/2018 CT abdomen and pelvis. FINDINGS: Lower chest: Stable bilateral lower lobe bronchiectasis. Hepatobiliary: Punctate calcification within the mid liver is stable compatible sequelae of prior infectious or inflammatory process. No focal liver abnormality is seen. Cholelithiasis. No gallbladder wall thickening. No biliary ductal dilatation. Pancreas: Unremarkable. No pancreatic ductal dilatation or surrounding inflammatory changes. Spleen: Normal in size without focal abnormality. Adrenals/Urinary Tract: Adrenal glands are unremarkable. Stable 12 mm cyst within the right interpolar kidney. Otherwise, kidneys are normal, without renal calculi, focal lesion, or hydronephrosis. Bladder is unremarkable. Stomach/Bowel: Small bowel anastomosis in the left lower quadrant. Focally dilated loops of small bowel in the left hemiabdomen compatible small bowel obstruction possibly related to internal hernia or adhesion. No bowel wall thickening or findings of  perforation. Appendix not identified, no pericecal inflammation. Vascular/Lymphatic: Aortic atherosclerosis. No enlarged abdominal or pelvic lymph nodes. Reproductive: Prostate is unremarkable. Other: Mid ventral abdominal hernia containing fat. Musculoskeletal: No fracture is seen. 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 Advanced discogenic degenerative changes of the lumbar spine with loss of intervertebral disc space height and vacuum phenomenon. IMPRESSION: 1. Focally dilated loops of small bowel in the left hemiabdomen compatible with small bowel obstruction possibly related to internal hernia or adhesion. No bowel wall thickening or findings of perforation. 2. Cholelithiasis. 3.  Aortic Atherosclerosis (ICD10-I70.0). 4. Stable bilateral lower lobe bronchiectasis. Electronically Signed   By: LKristine GarbeM.D.   On: 09/01/2018 04:58   [place] Site Rite  Result Date: 09/05/2018 If Site Rite image not attached, placement could not be confirmed due to current cardiac rhythm.     Assessment and Plan: Patient Active Problem List   Diagnosis Date Noted  . SBO (small bowel obstruction) (HSkamania 04/22/2018  . Incarcerated hernia 04/14/2018  . Incarcerated hernia of abdominal cavity   . Aneurysm ([place] 07/10/2016  . Nonspecific abnormal finding 05/11/2015  . Allergic rhinitis 05/11/2015  . Anxiety 05/11/2015  . Bronchitis, chronic (HCanada de los [place]amos 05/11/2015  . CCF (congestive cardiac failure) (HCaval[place] 05/11/2015  . Clinical depression 05/11/2015  . 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 Essential (primary) hypertension 05/11/2015  . Acid reflux 05/11/2015  . HLD (hyperlipidemia) 05/11/2015  . Adult hypothyroidism 05/11/2015  . Cervical dysplasia, mild 05/11/2015  . Neuropathy 05/11/2015  . Adiposity 05/11/2015  . Obstructive apnea 05/11/2015  . Arthritis, degenerative 05/11/2015    1. COPD she has severe  disease spirometry was done today showing FEV1 33% we will continue with her current inhaler  regimen.  This seems to be maintaining her fine.  She also needs to continue with her oxygen therapy as prescribed. 2. CHF right now is compensated at baseline we will continue with supportive care 3. Chronic Oxygen depenedent on oxygen will be continued at present flow rate 4. Chronic respiratory failure with hypoxia as above on oxygen continue with present flow rate  General Counseling: I have discussed the findings of the evaluation and examination with Bettye.  I have also discussed any further diagnostic evaluation thatmay be needed or ordered today. Micha verbalizes understanding of the findings of todays visit. We also reviewed her medications today and discussed drug interactions and side effects including but not limited excessive drowsiness and altered mental states. We also discussed that there is always a risk not just to her but also people around her. she has been encouraged to call the office with any questions or concerns that should arise related to todays visit.    Time spent: 43mn  I have personally obtained a history, examined the patient, evaluated laboratory and imaging results, formulated the assessment and plan and placed orders.    SAllyne Gee MD FColumbia Eye Surgery Center IncPulmonary and Critical Care Sleep medicine

## 2018-09-30 DIAGNOSIS — J449 Chronic obstructive pulmonary disease, unspecified: Secondary | ICD-10-CM | POA: Diagnosis not present

## 2018-09-30 DIAGNOSIS — Z7984 Long term (current) use of oral hypoglycemic drugs: Secondary | ICD-10-CM | POA: Diagnosis not present

## 2018-09-30 DIAGNOSIS — Z9981 Dependence on supplemental oxygen: Secondary | ICD-10-CM | POA: Diagnosis not present

## 2018-09-30 DIAGNOSIS — Z7951 Long term (current) use of inhaled steroids: Secondary | ICD-10-CM | POA: Diagnosis not present

## 2018-09-30 DIAGNOSIS — I10 Essential (primary) hypertension: Secondary | ICD-10-CM | POA: Diagnosis not present

## 2018-09-30 DIAGNOSIS — K565 Intestinal adhesions [bands], unspecified as to partial versus complete obstruction: Secondary | ICD-10-CM | POA: Diagnosis not present

## 2018-09-30 DIAGNOSIS — Z48815 Encounter for surgical aftercare following surgery on the digestive system: Secondary | ICD-10-CM | POA: Diagnosis not present

## 2018-09-30 DIAGNOSIS — E039 Hypothyroidism, unspecified: Secondary | ICD-10-CM | POA: Diagnosis not present

## 2018-09-30 DIAGNOSIS — E1140 Type 2 diabetes mellitus with diabetic neuropathy, unspecified: Secondary | ICD-10-CM | POA: Diagnosis not present

## 2018-09-30 DIAGNOSIS — Z7982 Long term (current) use of aspirin: Secondary | ICD-10-CM | POA: Diagnosis not present

## 2018-09-30 DIAGNOSIS — Z87891 Personal history of nicotine dependence: Secondary | ICD-10-CM | POA: Diagnosis not present

## 2018-10-01 NOTE — Telephone Encounter (Signed)
Advised 

## 2018-10-07 DIAGNOSIS — Z48815 Encounter for surgical aftercare following surgery on the digestive system: Secondary | ICD-10-CM | POA: Diagnosis not present

## 2018-10-07 DIAGNOSIS — K565 Intestinal adhesions [bands], unspecified as to partial versus complete obstruction: Secondary | ICD-10-CM | POA: Diagnosis not present

## 2018-10-07 DIAGNOSIS — Z7984 Long term (current) use of oral hypoglycemic drugs: Secondary | ICD-10-CM | POA: Diagnosis not present

## 2018-10-07 DIAGNOSIS — J449 Chronic obstructive pulmonary disease, unspecified: Secondary | ICD-10-CM | POA: Diagnosis not present

## 2018-10-07 DIAGNOSIS — Z7951 Long term (current) use of inhaled steroids: Secondary | ICD-10-CM | POA: Diagnosis not present

## 2018-10-07 DIAGNOSIS — E1140 Type 2 diabetes mellitus with diabetic neuropathy, unspecified: Secondary | ICD-10-CM | POA: Diagnosis not present

## 2018-10-07 DIAGNOSIS — Z87891 Personal history of nicotine dependence: Secondary | ICD-10-CM | POA: Diagnosis not present

## 2018-10-07 DIAGNOSIS — Z7982 Long term (current) use of aspirin: Secondary | ICD-10-CM | POA: Diagnosis not present

## 2018-10-07 DIAGNOSIS — Z9981 Dependence on supplemental oxygen: Secondary | ICD-10-CM | POA: Diagnosis not present

## 2018-10-07 DIAGNOSIS — E039 Hypothyroidism, unspecified: Secondary | ICD-10-CM | POA: Diagnosis not present

## 2018-10-07 DIAGNOSIS — I10 Essential (primary) hypertension: Secondary | ICD-10-CM | POA: Diagnosis not present

## 2018-10-13 DIAGNOSIS — Z9981 Dependence on supplemental oxygen: Secondary | ICD-10-CM | POA: Diagnosis not present

## 2018-10-13 DIAGNOSIS — E1140 Type 2 diabetes mellitus with diabetic neuropathy, unspecified: Secondary | ICD-10-CM | POA: Diagnosis not present

## 2018-10-13 DIAGNOSIS — Z7984 Long term (current) use of oral hypoglycemic drugs: Secondary | ICD-10-CM | POA: Diagnosis not present

## 2018-10-13 DIAGNOSIS — Z48815 Encounter for surgical aftercare following surgery on the digestive system: Secondary | ICD-10-CM | POA: Diagnosis not present

## 2018-10-13 DIAGNOSIS — K565 Intestinal adhesions [bands], unspecified as to partial versus complete obstruction: Secondary | ICD-10-CM | POA: Diagnosis not present

## 2018-10-13 DIAGNOSIS — Z87891 Personal history of nicotine dependence: Secondary | ICD-10-CM | POA: Diagnosis not present

## 2018-10-13 DIAGNOSIS — Z7951 Long term (current) use of inhaled steroids: Secondary | ICD-10-CM | POA: Diagnosis not present

## 2018-10-13 DIAGNOSIS — I10 Essential (primary) hypertension: Secondary | ICD-10-CM | POA: Diagnosis not present

## 2018-10-13 DIAGNOSIS — J449 Chronic obstructive pulmonary disease, unspecified: Secondary | ICD-10-CM | POA: Diagnosis not present

## 2018-10-13 DIAGNOSIS — E039 Hypothyroidism, unspecified: Secondary | ICD-10-CM | POA: Diagnosis not present

## 2018-10-13 DIAGNOSIS — Z7982 Long term (current) use of aspirin: Secondary | ICD-10-CM | POA: Diagnosis not present

## 2018-10-14 DIAGNOSIS — J449 Chronic obstructive pulmonary disease, unspecified: Secondary | ICD-10-CM | POA: Diagnosis not present

## 2018-10-15 ENCOUNTER — Ambulatory Visit (INDEPENDENT_AMBULATORY_CARE_PROVIDER_SITE_OTHER): Payer: Medicare Other

## 2018-10-15 DIAGNOSIS — I10 Essential (primary) hypertension: Secondary | ICD-10-CM | POA: Diagnosis not present

## 2018-10-15 DIAGNOSIS — J42 Unspecified chronic bronchitis: Secondary | ICD-10-CM | POA: Diagnosis not present

## 2018-10-15 DIAGNOSIS — I509 Heart failure, unspecified: Secondary | ICD-10-CM

## 2018-10-15 NOTE — Chronic Care Management (AMB) (Signed)
Chronic Care Management   Initial Visit Note  10/15/2018 Name: Kristen Schroeder MRN: [id number] DOB: 03/03/1941  Referred by: Jerrol Banana., MD Reason for referral : Chronic Care Management (COPD, medication affordiability)   Subjective: "I am doing OK, but I just need help with these medications. I really don't know what I take".  Objective:  Lab Results  Component Value Date   HGBA1C 6.4 (A) 09/24/2018   BP Readings from Last 3 Encounters:  09/29/18 (!) 162/88  09/24/18 (!) 160/70  09/19/18 (!) 158/79    Assessment: Kristen Schroeder a 77 y.o.year old femalewho sees Jerrol Banana., MDfor primary care. Dr. Orma Flaming the CCM team to consult the patient for assistance with chronic disease management related to HTN, chronic Bronchitis, O2 dependency and CHF. Patient also needs financial assistance with her [facility] inhaler.Referral was placed 09/24/18 during patient's routine office visit. Today Ms Schroeder is seen in the office by the CCM RN CM to discuss her healthcare goals. She will be followed up with telephonically later this week by the [facility] Pharmacist for medication affordability. Goals    . "I can't afford this dulera" (pt-stated)     Kristen Schroeder has been prescribed Dulera that she cannot afford. She is requesting medication assistance if available. She has 38 puffs left on her current Dulera inhaler and has been give 3 symbicort sample inhalers to use afterwards until she can receive assistance with [facility] cost. Clinical Goals: Over the next 7 days, patient will engage with [facility] Pharmacist for medication management and assistance  Interventions: Completed medication reconciliation and forwarded to [place]    . "I need to get my blood pressure down just a litle" (pt-stated)     Kristen Schroeder has been diagnosed with HTN for many years. She is currently prescribed two medications, one daily and one as needed for BP control. She is taking the  PRN medication daily and her blood pressure is still as documented above. She is interested in lifestyle and diet modification to assist with BP control.  Clinical Goals: Over the 30 days, patient will implement strategies discussed today to lower sodium intake through food   Interventions: Reviewed how higher BP affects other parts of the body such as heart and brain, Discussed importance of weekly monitoring and recording, Dash diet education reviewed verbally and written materials provided.       Marland Kitchen "I don't know what COPD action plan is" (pt-stated)       Upon review of patients knowledge of COPD, patient states she is unaware of COPD action plan. She denies recent hospitalization for COPD exacerbation. She is O2 dependent and compliant with prescription. She takes her medications as prescribed although she is unsure if she continues to take [place]. She did not bring medications with her today. She has a pulse oximeter and checks her saturations often. She is aware that her saturations need to be >88-90%.  Clinical Goals: Over the next 30 days, patient will verbalize action plan for "yellow zone" symptoms  Interventions: Patient provided and instructed on COPD action plan. Provided [facility] Calendar and reviewed HTN, COPD, and DM tabs.      Plan: CCM RN CM will follow up with Kristen Schroeder in 32month   Kristen Schroeder given information about Chronic Care Management services today including:  1. CCM service includes personalized support from designated clinical staff supervised by her physician, including individualized plan of care and coordination with other care  providers 2. 24/7 contact phone numbers for assistance for urgent and routine care needs. 3. Service will only be billed when office clinical staff spend 20 minutes or more in a month to coordinate care. 4. Only one practitioner may furnish and bill the service in a calendar month. 5. The patient may stop CCM services at any time  (effective at the end of the month) by phone call to the office staff. 6. The patient will be responsible for cost sharing (co-pay) of up to 20% of the service fee (after annual deductible is met).  Patient agreed to services and verbal consent obtained.  Zorian Gunderman E. Rollene Rotunda, RN, BSN Nurse Care Coordinator [facility] Care Management [phone]

## 2018-10-15 NOTE — Patient Instructions (Addendum)
1. Thank you for allowing the CCM Team to assist you with your healthcare goals!! It was a pleasure to see you and your husband again. 2. Continue to check your BP 2 times a week and record. I will call you in several weeks to check in and review numbers. 3. Review information on Dash Diet/low sodium diet given today and implement when you can. 4. Take all medications as prescribed. Our Clinic phamacist will contact you about your medications and need for assistance. 5. Contact your CCM Team with any needs, concerns, or questions 6. Someone from our CCM (Chronic Care Management) Team will call you.   Marja KaysAlisa Gilboy MHA,BSN,RN,CCM Nurse Care Coordinator  [phone]   Karalee HeightJulie Hedrick PharmD  Clinical Pharmacist  [phone]  Goals Addressed    . "I need to get my blood pressure down just a litle" (pt-stated)        Clinical Goals: Over the 30 days, patient will implement strategies discussed today to lower sodium intake through food   Interventions: Reviewed how higher BP affects other parts of the body such as heart and brain, Discussed importance of weekly monitoring and recording, Dash diet education reviewed verbally and written materials provided.    Cooking With Less Salt Cooking with less salt is one way to reduce the amount of sodium you get from food. Depending on your condition and overall health, your health care provider or diet and nutrition specialist (dietitian) may recommend that you reduce your sodium intake. Most people should have less than 2,300 milligrams (mg) of sodium each day. If you have high blood pressure (hypertension), you may need to limit your sodium to 1,500 mg each day. Follow the tips below to help reduce your sodium intake. What do I need to know about cooking with less salt? Shopping  Buy sodium-free or low-sodium products. Look for the following words on food labels: ? Low-sodium. ? Sodium-free. ? Reduced-sodium. ? No salt  added. ? Unsalted.  Buy fresh or frozen vegetables. Avoid canned vegetables.  Avoid buying meats or protein foods that have been injected with broth or saline solution.  Avoid cured or smoked meats, such as hot dogs, bacon, salami, ham, and bologna. Reading food labels  Check the food label before buying or using packaged ingredients.  Look for products with no more than 140 mg of sodium in one serving.  Do not choose foods with salt as one of the first three ingredients on the ingredients list. If salt is one of the first three ingredients, it usually means the item is high in sodium, because ingredients are listed in order of amount in the food item. Cooking  Use herbs, seasonings without salt, and spices as substitutes for salt in foods.  Use sodium-free baking soda when baking.  Grill, braise, or roast foods to add flavor with less salt.  Avoid adding salt to pasta, rice, or hot cereals while cooking.  Drain and rinse canned vegetables before use.  Avoid adding salt when cooking sweets and desserts.  Cook with low-sodium ingredients. What are some salt alternatives? The following are herbs, seasonings, and spices that can be used instead of salt to give taste to your food. Herbs should be fresh or dried. Do not choose packaged mixes. Next to the name of the herb, spice, or seasoning are some examples of foods you can pair it with. Herbs  Bay leaves - Soups, meat and vegetable dishes, and spaghetti sauce.  Basil - NVR Inctalian dishes, soups, pasta, and  fish dishes.  Cilantro - Meat, poultry, and vegetable dishes.  Chili powder - Marinades and Mexican dishes.  Chives - Salad dressings and potato dishes.  Cumin - Mexican dishes, couscous, and meat dishes.  Dill - Fish dishes, sauces, and salads.  Fennel - Meat and vegetable dishes, breads, and cookies.  Garlic (do not use garlic salt) - [place] dishes, meat dishes, salad dressings, and sauces.  Marjoram - Soups, potato  dishes, and meat dishes.  Oregano - Pizza and spaghetti sauce.  Parsley - Salads, soups, pasta, and meat dishes.  Rosemary - [place] dishes, salad dressings, soups, and red meats.  Saffron - Fish dishes, pasta, and some poultry dishes.  Sage - Stuffings and sauces.  Tarragon - Fish and Whole Foods.  Thyme - Stuffing, meat, and fish dishes. Seasonings  Lemon juice - Fish dishes, poultry dishes, vegetables, and salads.  Vinegar - Salad dressings, vegetables, and fish dishes. Spices  Cinnamon - Sweet dishes, such as cakes, cookies, and puddings.  Cloves - Gingerbread, puddings, and marinades for meats.  Curry - Vegetable dishes, fish and poultry dishes, and stir-fry dishes.  Ginger - Vegetables dishes, fish dishes, and stir-fry dishes.  Nutmeg - Pasta, vegetables, poultry, fish dishes, and custard. What are some low-sodium ingredients and foods?  Fresh or frozen fruits and vegetables with no sauce added.  Fresh or frozen whole meats, poultry, and fish with no sauce added.  Eggs.  Noodles, pasta, quinoa, rice.  Shredded or puffed wheat or puffed rice.  Regular or quick oats.  Milk, yogurt, hard cheeses, and low-sodium cheeses. Good cheese choices include Swiss, [employer], and [address]. Always check the label for the serving size and sodium content.  Unsalted butter or margarine.  Unsalted nuts.  Sherbet or ice cream (keep to  cup per serving).  Homemade pudding.  Sodium-free baking soda and baking powder. This is not a complete list of low-sodium ingredients and foods. Contact your dietitian for more options. Summary  Cooking with less salt is one way to reduce the amount of sodium that you get from food.  Buy sodium-free or low-sodium products.  Check the food label before using or buying packaged ingredients.  Use herbs, seasonings without salt, and spices as substitutes for salt in foods. This information is not intended to replace advice  given to you by your health care provider. Make sure you discuss any questions you have with your health care provider. Document Released: 10/22/2005 Document Revised: 10/30/2016 Document Reviewed: 10/30/2016 Elsevier Interactive Patient Education  2017 [employer].

## 2018-10-16 ENCOUNTER — Ambulatory Visit: Payer: Self-pay | Admitting: Pharmacist

## 2018-10-16 NOTE — Chronic Care Management (AMB) (Signed)
  Chronic Care Management   Note  10/16/2018 Name: Kristen KaufmanRuthie L Stehlin MRN: [id number] DOB: 1941/11/05    77 y.o. year old female referred to Chronic Care Management by Dr. Sullivan LoneGilbert for COPD and medication assistance. Initial CCM office visit conducted with nurse case manager 10/15/18.   Outreach call today with CCM pharmacist as unfortunately I was unable to be present at patient's CCM appointment yesterday.   Was unable to reach patient via telephone today and have left HIPAA compliant voicemail asking patient to return my call. (unsuccessful outreach #1).  Plan: Will follow-up within 3-5  business days via telephone.   Karalee HeightJulie Mia Milan, PharmD Clinical Pharmacist [facility] [phone]

## 2018-10-20 ENCOUNTER — Ambulatory Visit: Payer: Self-pay | Admitting: Pharmacist

## 2018-10-20 DIAGNOSIS — G4733 Obstructive sleep apnea (adult) (pediatric): Secondary | ICD-10-CM

## 2018-10-20 DIAGNOSIS — J449 Chronic obstructive pulmonary disease, unspecified: Secondary | ICD-10-CM

## 2018-10-20 DIAGNOSIS — J42 Unspecified chronic bronchitis: Secondary | ICD-10-CM

## 2018-10-20 NOTE — Chronic Care Management (AMB) (Signed)
  Chronic Care Management   Note  10/20/2018 Name: Kristen KaufmanRuthie L Tortora MRN: [id number] DOB: [birth year]    77 y.o. year old female referred to Chronic Care Management by Dr. Sullivan LoneGilbert for COPD and medication assistance. Initial CCM office visit conducted with Yvone NeuPortia Payne, nurse case manager on 10/15/18.  Was unable to reach patient via telephone today and have left HIPAA compliant voicemail asking patient to return my call. (unsuccessful outreach #2).  Plan: Will follow-up within 3-5  business days via telephone.   Karalee HeightJulie Dona Klemann, PharmD Clinical Pharmacist [facility] [phone]

## 2018-10-20 NOTE — Chronic Care Management (AMB) (Signed)
Chronic Care Management   Note  10/20/2018 Name: Kristen Schroeder MRN: [id number] DOB: 1940-12-20   77 y.o. year old female referred to [facility] pharmacist for medication assistance.   Referral source: Dr. Rosanna Randy Referral medication(s): Ruthe Mannan Current insurance:[employer] Medicare Currently receiving Extra Help:  _0  Yes _1  No _2  Unknown  Subjective: "I can't afford my Dulera copay"  Objective: Medications Reviewed Today    Reviewed by Benedetto Goad, RN (Registered Nurse) on 10/15/18 at 1405  Med List Status: <None>  Medication Order Taking? Sig Documenting Provider Last Dose Status Informant  arformoterol (BROVANA) 15 MCG/2ML NEBU [id number] No Take 2 mLs (15 mcg total) by nebulization 2 (two) times daily.  Patient not taking:  Reported on 10/15/2018   Jerrol Banana., MD Not Taking Active Self           Med Note Rollene Rotunda, Clois Dupes   Wed Oct 15, 2018  1:42 PM) Patient states she does not have this medication  aspirin EC 81 MG tablet [id number] Yes Take 81 mg by mouth daily. [provider] Taking Active Self  blood glucose meter kit and supplies KIT [id number] Yes Dispense based on patient and insurance preference. Use up to four times daily as directed. (FOR ICD-9 250.00, 250.01). Jerrol Banana., MD Taking Active Self  Blood Glucose Monitoring Suppl (ONE TOUCH ULTRA 2) w/Device KIT [id number] Yes 1 each by Does not apply route daily. Jerrol Banana., MD Taking Active Self  fluticasone [facility]) 50 MCG/ACT nasal spray [id number] Yes Place 1 spray into both nostrils daily as needed.  [provider] Taking Active Self  furosemide (LASIX) 20 MG tablet [id number] Yes Take 1 tablet (20 mg total) by mouth daily. Jerrol Banana., MD Taking Active Self  gabapentin (NEURONTIN) 300 MG capsule [id number] Yes 2 tablets in the morning, 1 tablet in the afternoon and 2 tablets in the evening Jerrol Banana., MD Taking Active Self  glucose blood (ONE TOUCH ULTRA  TEST) test strip [id number] Yes USE 1 STRIP 2 TIMES DAILY AND AS NEEDED Jerrol Banana., MD Taking Active Self  HYDROcodone-acetaminophen [facility]) 10-325 MG tablet [id number] No Take by mouth. [provider] Not Taking Consider Medication Status and Discontinue (No longer needed (for PRN medications))            Med Note (PAYNE, PORTIA E   Wed Oct 15, 2018  1:45 PM) Patient no longer has this medication  ipratropium-albuterol (DUONEB) 0.5-2.5 (3) MG/3ML SOLN [id number] Yes INHALE 1 VIAL VIA NEBULIZER EVERY 6 HOURS AS NEEDED. Allyne Gee, MD Taking Active Self           Med Note Rollene Rotunda, Clois Dupes   Wed Oct 15, 2018  1:45 PM) Patient takes 3 to 4 times daily  isosorbide mononitrate (IMDUR) 30 MG 24 hr tablet [id number] Yes Take 30 mg by mouth daily as needed (high bp). [provider] Taking Active Self           Med Note Rollene Rotunda, Clois Dupes   Wed Oct 15, 2018  1:47 PM) Patient states she takes this medication every day. Patient states her pressures at home run 130s. See last 3 BP  levothyroxine (SYNTHROID, LEVOTHROID) 125 MCG tablet [id number] Yes TAKE 1 TABLET BY MOUTH DAILY Jerrol Banana., MD Taking Active Self  lisinopril (PRINIVIL,ZESTRIL) 20 MG tablet [id number] Yes Take 1 tablet (20 mg total) by mouth daily. Jerrol Banana., MD Taking Active Self  loratadine (CLARITIN) 10 MG tablet [id number] Yes TAKE 1 TABLET (10 MG TOTAL) BY MOUTH DAILY. Jerrol Banana., MD Taking Active Self           Med Note [facility], PORTIA E   Wed Oct 15, 2018  1:48 PM) Takes as needed  LORazepam (ATIVAN) 0.5 MG tablet [id number] Yes Take 1 tablet (0.5 mg total) by mouth 2 (two) times daily as needed for anxiety. Jerrol Banana., MD Taking Active Self           Med Note [facility], Clois Dupes   Wed Oct 15, 2018  1:48 PM) Has not taken "in a while"  metFORMIN (GLUCOPHAGE) 1000 MG tablet [id number] Yes TAKE 1 TABLET BY MOUTH TWICE A DAY  Patient taking differently:  Take 1,000 mg by  mouth daily with breakfast.    Jerrol Banana., MD Taking Active Self  mometasone-formoterol [facility]) 100-5 MCG/ACT Hollie Salk [id number] Yes Inhale 2 puffs into the lungs 2 (two) times daily. Lavera Guise, MD Taking Active Self           Med Note Rollene Rotunda, Clois Dupes   Wed Oct 15, 2018  1:54 PM) Patient has one inhaler left with 38 doses. Uses twice a day, sometimes as a rescue but not often. Has 3 symbicort 60 puff each to use until [facility] can be purchased.  MULTIPLE VITAMIN PO [id number] Yes Take 1 tablet by mouth daily.  [provider] Taking Active Self           Med Note Samara Snide, Lillia Pauls   Tue Apr 09, 2017  1:30 PM)    omeprazole (PRILOSEC) 40 MG capsule [id number] Yes Take 1 capsule (40 mg total) by mouth daily. Jerrol Banana., MD Taking Active Self           Med Note Rollene Rotunda, Clois Dupes   Wed Oct 15, 2018  1:54 PM) Taking as needed  ondansetron (ZOFRAN-ODT) 4 MG disintegrating tablet [id number] Yes TAKE 1 TABLET BY MOUTH EVERY 8 HOURS AS NEEDED FOR NAUSEA AND VOMITING Mar Daring, PA-C Taking Active Self  oxyCODONE-acetaminophen (PERCOCET/ROXICET) 5-325 MG tablet [id number]  Take 1 tablet by mouth every 6 (six) hours as needed for severe pain. Tylene Fantasia, PA-C  Consider Medication Status and Discontinue (No longer needed (for PRN medications))            Med Note (PAYNE, PORTIA E   Wed Oct 15, 2018  1:55 PM) Patient took all this medication and no refills  OXYGEN [id number] Yes Inhale 4 L into the lungs. [provider] Taking Active Self           Med Note Rollene Rotunda, Clois Dupes   Wed Oct 15, 2018  1:56 PM) Patient uses 3 to 3 1/2 liters continuously  PARoxetine (PAXIL) 10 MG tablet [id number] No Take 1 tablet (10 mg total) by mouth at bedtime.  Patient not taking:  Reported on 10/15/2018   Jerrol Banana., MD Not Taking Active Self           Med Note Rollene Rotunda, Clois Dupes   Wed Oct 15, 2018  2:01 PM) Patient did a self ween and does not feel like she need  antidepressant any longer  pravastatin (PRAVACHOL) 10 MG tablet [id number] Yes TAKE 1 TABLET (10 MG TOTAL) BY MOUTH DAILY. Jerrol Banana., MD Taking Active Self  roflumilast (DALIRESP) 500 MCG TABS tablet [id number] No Take by mouth. [provider] Unknown Active  Med Note Rollene Rotunda, Clois Dupes   Wed Oct 15, 2018  2:04 PM) Patient is unsure if she is taking this medication however recent pulmonary visit indicates that she is.  tiotropium (SPIRIVA) 18 MCG inhalation capsule [id number] No Place into inhaler and inhale. [provider] Not Taking Active            Med Note Rollene Rotunda, Clois Dupes   Wed Oct 15, 2018  2:05 PM) Patient will resume taking once dulera inhaler is completed (until she can afford new dulera)          Medication Assistance Findings:  Extra Help:   _0  Already receiving  _1  Eligible based on reported income  _2  Not Eligible based on reported income  Patient Assistance Programs: 1) Dulera made by [employer] o Income requirement met: _3  Yes _4  No o Troop requirement met: _5  Yes _6  No  - We will pursue applying following approval or denial from Medicare Extra Help as qualifying for Extra Help is an exclusion to the program -    Additional medication assistance options reviewed with patient as warranted including:  Marland Kitchen Mail order programs . Pensions consultant . Coupons + Agricultural consultant  Goals Addressed            This Visit's Progress   . "I can't afford this Dulera" (pt-stated)        Clinical Goals: Over the next 7 days, patient will engage with [facility] Pharmacist for medication management and assistance  Interventions:  10/20/18: CCM pharmacist will collaborate with patient to apply for Medicare Extra Help, and if needed, apply for assistance through drug manufacturers 10/15/18:Completed medication reconciliation and forwarded to [place]:  Patient and I will apply for Medicare Extra Help  via telephone appointment on 10/21/18. She will gather needed information, including SSN and bank statement.   Time spent: 27 minutes  Ruben Reason, PharmD Clinical Pharmacist [place] [phone]

## 2018-10-21 ENCOUNTER — Ambulatory Visit: Payer: Self-pay | Admitting: Pharmacist

## 2018-10-21 DIAGNOSIS — G4733 Obstructive sleep apnea (adult) (pediatric): Secondary | ICD-10-CM

## 2018-10-21 DIAGNOSIS — J449 Chronic obstructive pulmonary disease, unspecified: Secondary | ICD-10-CM

## 2018-10-21 DIAGNOSIS — J42 Unspecified chronic bronchitis: Secondary | ICD-10-CM

## 2018-10-22 NOTE — Chronic Care Management (AMB) (Signed)
  Chronic Care Management   Note  10/22/2018 Name: Kristen Schroeder MRN: [id number] DOB: 08-Apr-1941  Medication Assistance Findings:  Extra Help:   []  Already receiving Full Extra Help  []  Already receiving Partial Extra Help  [x]  Eligible based on reported income and assets  []  Not Eligible based on reported income and assets   Follow up call placed to patient today to apply for Medicare Extra Help based on reported household income. HIPAA identifiers verified.  Goals Addressed            This Visit's Progress   . "I can't afford this Dulera" (pt-stated)   On track     Clinical Goals: Over the next 7 days, patient will engage with [facility] Pharmacist for medication management and assistance  Interventions:  10/20/18: CCM pharmacist will collaborate with patient to apply for Medicare Extra Help, and if needed, apply for assistance through drug manufacturers 10/15/18:Completed medication reconciliation and [facility] to [facility]        Plan: Follow up with patient in 2 weeks to check on status of application and determine if it's necessary to apply for manufacturer's assistance programs.   Karalee HeightJulie Daissy Yerian, PharmD Clinical Pharmacist [facility] [phone]

## 2018-10-22 NOTE — Patient Instructions (Signed)
Please keep a close eye on your mail- [employer] Security Administration will mail you a letter stating the status of your Medicare Extra Help application.   Please call a member of the CCM (Chronic Care Management) Team with any questions or case management needs:   Arthur HolmsPortia Payne, Rn, BSN Nurse Care Coordinator  [phone]  Karalee HeightJulie Adyen Bifulco, PharmD  Clinical Pharmacist  [phone]    The patient verbalized understanding of instructions provided today and declined a print copy of patient instruction materials.

## 2018-10-27 ENCOUNTER — Telehealth: Payer: Self-pay | Admitting: Family Medicine

## 2018-10-27 MED ORDER — AZITHROMYCIN 250 MG PO TABS: ORAL_TABLET | ORAL | 0 refills | Status: DC

## 2018-10-27 NOTE — Telephone Encounter (Signed)
Done

## 2018-10-27 NOTE — Telephone Encounter (Signed)
Patient said she started sneezing really bad and was told by her "docs that she doesn't need to get a cold or sinus infection" and is requesting a Zpak to [facility] [place].    She is not having anymore symptoms.  [facility] [place]

## 2018-10-27 NOTE — Telephone Encounter (Signed)
ok 

## 2018-10-27 NOTE — Telephone Encounter (Signed)
Please advise 

## 2018-11-06 ENCOUNTER — Ambulatory Visit (INDEPENDENT_AMBULATORY_CARE_PROVIDER_SITE_OTHER): Payer: Medicare Other | Admitting: Pharmacist

## 2018-11-06 DIAGNOSIS — E119 Type 2 diabetes mellitus without complications: Secondary | ICD-10-CM | POA: Diagnosis not present

## 2018-11-06 DIAGNOSIS — I509 Heart failure, unspecified: Secondary | ICD-10-CM | POA: Diagnosis not present

## 2018-11-06 DIAGNOSIS — J449 Chronic obstructive pulmonary disease, unspecified: Secondary | ICD-10-CM

## 2018-11-06 NOTE — Chronic Care Management (AMB) (Signed)
  Chronic Care Management   Follow Up Note   11/06/2018 Name: Kristen KaufmanRuthie L Schroeder MRN: [id number] DOB: [birth year]  Referred by: Maple HudsonGilbert, Richard L Jr., MD Reason for referral : Chronic Care Management (Extra Help )    Subjective:  "I haven't gotten any mail yet from Social Security"  Assessment: Telephone follow up with Kristen Schroeder today regarding Medicare Extra Help (LIS) application submitted 10/20/18. HIPAA identifiers verified.  Kristen Schroeder states she has not received any communication in the mail from [employer] (an approval or denial of application).   Patient further states that her daughter filled out an application to Medicaid for her and [place] [facility] [place] [place] [employer] office. Kristen Schroeder has three outstanding [facility] for hospitalizations in summer 2019, totaling "over $5,000". Kristen Schroeder states that she tries to pay the bills "a little at a time- about $50 per month" but is worried the bills have been sent to collections. This is why her daughter applied for Medicaid.    Goals Addressed            This Visit's Progress   . "I can't afford this Dulera" (pt-stated)        Clinical Goals: (updated) Over the next 14 days, patient will engage with CCM Clinic Pharmacist for medication management and assistance  Interventions:  11/06/17: Patient states she has not received any mail from [employer] regarding status of Extra Help application 10/20/18: CCM pharmacist will collaborate with patient to apply for Medicare Extra Help, and if needed, apply for assistance through drug manufacturers  Please see past updates related to this goal by clicking on the "Past Updates" button in the selected goal          Plan:  Follow up on LIS application in 2 weeks.   Coordinate with [facility] LCSW and RN care manager surrounding Medicaid applications and any financial resources for [facility].   Kristen HeightJulie Lajune Schroeder, PharmD Clinical  Pharmacist [facility] [phone]

## 2018-11-12 ENCOUNTER — Telehealth: Payer: Self-pay

## 2018-11-12 ENCOUNTER — Ambulatory Visit: Payer: Self-pay

## 2018-11-12 DIAGNOSIS — E119 Type 2 diabetes mellitus without complications: Secondary | ICD-10-CM

## 2018-11-12 DIAGNOSIS — J449 Chronic obstructive pulmonary disease, unspecified: Secondary | ICD-10-CM

## 2018-11-12 DIAGNOSIS — I509 Heart failure, unspecified: Secondary | ICD-10-CM

## 2018-11-12 NOTE — Chronic Care Management (AMB) (Signed)
  Chronic Care Management Note      Lynnea L Haithis a 78 y.o.year old femalewho sees Maple Hudson., MDfor primary care. Dr. Leone Brand the CCM team to consult the patient for assistance with chronic disease management related to HTN, chronic Bronchitis, O2 dependency and CHF. Patient also needs financial assistance with her [facility] inhaler.Referral was placed 09/24/18 during patient's routine office visit. Today I attempted to follow up with Ms. Vosler via telephone to assess progression towards established goals.  Was unable to reach patient and have left HIPAA compliant voicemail asking patient to return my call. (unsuccessful outreach #1).  Plan: Will follow-up within 3-5  business days via telephone.   Sheryle Vice E. Suzie Portela, RN, BSN Nurse Care Coordinator [facility] Practice/[facility] Care Management [phone]

## 2018-11-13 ENCOUNTER — Telehealth: Payer: Self-pay | Admitting: Internal Medicine

## 2018-11-13 NOTE — Telephone Encounter (Signed)
Faxed oxygen order to [employer] and put into scan.jw

## 2018-11-14 DIAGNOSIS — J449 Chronic obstructive pulmonary disease, unspecified: Secondary | ICD-10-CM | POA: Diagnosis not present

## 2018-11-15 ENCOUNTER — Other Ambulatory Visit: Payer: Self-pay | Admitting: Family Medicine

## 2018-11-19 ENCOUNTER — Ambulatory Visit: Payer: Self-pay

## 2018-11-19 ENCOUNTER — Ambulatory Visit: Payer: Self-pay | Admitting: Pharmacist

## 2018-11-19 DIAGNOSIS — J449 Chronic obstructive pulmonary disease, unspecified: Secondary | ICD-10-CM

## 2018-11-19 DIAGNOSIS — I10 Essential (primary) hypertension: Secondary | ICD-10-CM

## 2018-11-19 DIAGNOSIS — I509 Heart failure, unspecified: Secondary | ICD-10-CM

## 2018-11-19 DIAGNOSIS — E119 Type 2 diabetes mellitus without complications: Secondary | ICD-10-CM

## 2018-11-19 NOTE — Chronic Care Management (AMB) (Signed)
Chronic Care Management   Follow Up Note   11/19/2018 Name: Kristen Schroeder MRN: [id number] DOB: 12/27/1940  Referred by: Maple Hudson., MD Reason for referral : Chronic Care Management (30 day follow up)    Subjective: "I am checking my sugars and my blood pressure like you said and I am doing real good" "I do need help with contacting [facility] billing department for financial assistance"   Objective:  BP Readings from Last 3 Encounters:  09/29/18 (!) 162/88  09/24/18 (!) 160/70  09/19/18 (!) 158/79   Lab Results  Component Value Date   HGBA1C 6.4 (A) 09/24/2018     Assessment: Kristen L Haithis a 77 y.o.year old femalewho sees Maple Hudson., MDfor primary care. Dr. Leone Brand the CCM team to consult the patient for assistance with chronic disease management related to HTN, chronic Bronchitis, O2 dependency and CHF. Patient also needs financial assistance with her [facility] inhaler.Referral was placed 09/24/18 during patient's routine office visit. Kristen Schroeder is seen in the office by the CCM RN CM to discuss her healthcare goals on 10/15/18. Today, CCM RN CM followed up with Kristen. Schroeder to discuss her progression towards health goals.  Goals Addressed            This Visit's Progress   . "I can't afford my healthcare bills" (pt-stated)       Current Barriers:  . Limited social security income . High cost medications   Nurse Case Manager Clinical Goal(s): Over the next 2 patient will apply for financial assistance with [place] medial Bills with the assistance of the CCM Team  Interventions:  Marland Kitchen Verified combined monthly income of patient and spouse . Discussed property reduction application   Patient Self Care Activities:  . Patient will gather proof of income documents provided by social security and present to [facility] Team     . COMPLETED: "I don't know what COPD action plan is" (pt-stated)       Kristen. Schroeder is able to reference COPD action plan  in [facility] calendar previously given during initial face to face encounter. She utilizes her calendar to record her blood glucose levels and her blood pressures. She is aware that she can reference COPD action plan when symptoms warrent. She is grateful for COPD education.   Clinical Goals: Over the next 30 days, patient will verbalize action plan for "yellow zone" symptoms  Interventions: Patient provided and instructed on COPD action plan. Provided [facility] Calendar and reviewed HTN, COPD, and DM tabs.  11/19/17 Reviewed and Reinforced utilization of COPD action plan when symptoms warrant.     . COMPLETED: "I need to get my blood pressure down just a litle" (pt-stated)       Kristen. Schroeder states she is attempting to follow a lower sodium diet. She is recording her blood pressures 3 times a weeks. Today's morning reading prior to medications was 145/90 and after her medication 137/74. She is taking her medication as prescribed. She understands high BP can be asymptomatic and recording intermittent readings may allow PCP to adjust medications if necessary. She is utilizing her [facility] calendar to record BP reading and will bring to next PCP visit. She has reviewed materials provided during face to face encounter. She would be free from stress if she could get her medical bills "under control". See goal above.  Clinical Goals: Over the 30 days, patient will implement strategies discussed today to lower sodium intake through food   Interventions: Reviewed  how higher BP affects other parts of the body such as heart and brain, Discussed importance of weekly monitoring and recording, Dash diet education reviewed verbally and written materials provided.  11/19/17 Reinforced lifestyle modification for BP control such as low sodium diet, stress reduction, diabetes control.      Plan: Will follow up in 2 weeks to assist with financial assistance application/payment plan with [facility] E. Suzie Portela, RN,  BSN Nurse Care Coordinator [facility] Practice/[facility] Care Management [phone]

## 2018-11-19 NOTE — Patient Instructions (Addendum)
1. Continue to check your BP 2 times a week and record. You are doing a great job using your [facility] Calendar given to you during our last visit. 2. Continue to limit your processed foods and sodium intake. High BP can affect your kidneys, heart, and brain. 3. Take all medications as prescribed including your Dulera/Spiriva. Please call CCM Team for samples prior to running out. 4. Please gather documentation of SS income. Case Manager will research if property taxes can be reduced a second time. 5. Contact your CCM Team with any needs, concerns, or questions. We will call you in 2 weeks to check in and assist you with contacting [facility] Billing Department for payment assistance.  CCM (Chronic Care Management) Team   Yvone NeuPortia Chesky Heyer RN, BSN Nurse Care Coordinator  [phone]  Karalee HeightJulie Hedrick PharmD  Clinical Pharmacist  [phone]  Goals Addressed    . "I can't afford my healthcare bills" (pt-stated)       Current Barriers:  . Limited social security income . High cost medications   Nurse Case Manager Clinical Goal(s): Over the next 2 patient will apply for financial assistance with [place] medial Bills with the assistance of the CCM Team  Interventions:  Marland Kitchen. Verified combined monthly income of patient and spouse . Discussed property reduction application   Patient Self Care Activities:  . Patient will gather proof of income documents provided by social security and present to CCM Team   Please see past updates related to this goal by clicking on the "Past Updates" button in the selected goal      . COMPLETED: "I don't know what COPD action plan is" (pt-stated)        Clinical Goals: Over the next 30 days, patient will verbalize action plan for "yellow zone" symptoms  Interventions: Patient provided and instructed on COPD action plan. Provided [facility] Calendar and reviewed HTN, COPD, and DM tabs.  11/19/17 Reviewed and Reinforced utilization of COPD action plan when symptoms  warrant.     . COMPLETED: "I need to get my blood pressure down just a litle" (pt-stated)        Clinical Goals: Over the 30 days, patient will implement strategies discussed today to lower sodium intake through food   Interventions: Reviewed how higher BP affects other parts of the body such as heart and brain, Discussed importance of weekly monitoring and recording, Dash diet education reviewed verbally and written materials provided.  11/19/17 Reinforced lifestyle modification for BP control such as low sodium diet, stress reduction, diabetes control.     .Marland Kitchen

## 2018-11-20 ENCOUNTER — Other Ambulatory Visit: Payer: Self-pay | Admitting: Family Medicine

## 2018-11-20 DIAGNOSIS — I10 Essential (primary) hypertension: Secondary | ICD-10-CM

## 2018-11-20 NOTE — Patient Instructions (Signed)
The patient verbalized understanding of instructions provided today and declined a print copy of patient instruction materials.   

## 2018-11-20 NOTE — Chronic Care Management (AMB) (Signed)
  Chronic Care Management   Note  11/20/2018 Name: Kristen Schroeder MRN: [id number] DOB: 02-18-1941  Referred by: Kristen Schroeder., MD Reason for referral : Chronic Care Management (Extra Help )    Subjective:  "I haven't gotten any mail yet from Social Security"  Assessment: Telephone follow up with Kristen Schroeder today regarding Medicare Extra Help (LIS) application submitted 10/20/18. HIPAA identifiers verified.  Kristen Schroeder states she has not received any communication in the mail from [employer] (an approval or denial of application).   Patient has not heard anything from the Medicaid application her daughter submitted.   We will wait 2 more weeks before pursuing manufacturer's assistance for medications.   Goals Addressed            This Visit's Progress   . "I can't afford this Dulera" (pt-stated)        Clinical Goals: (updated) Over the next 14 days, patient will engage with CCM Clinic Pharmacist for medication management and assistance  Interventions:  11/20/17: Patient states she has not received any mail from [employer] regarding status of Extra Help application   Please see past updates related to this goal by clicking on the "Past Updates" button in the selected goal         Plan: Follow up in 2 weeks for status of LIS application  Coordinate three way phone call with Kristen Schroeder Financial Department to facilitate patient's request for assistance with medical bills.   Mail patient copy of "Tips and Tricks for Medicaid application" and the [name] Financial application from the [facility] LCSW  Kristen Schroeder, PharmD Clinical Pharmacist [facility] [phone]

## 2018-12-03 ENCOUNTER — Ambulatory Visit: Payer: Medicare Other | Admitting: Pharmacist

## 2018-12-03 DIAGNOSIS — I509 Heart failure, unspecified: Secondary | ICD-10-CM

## 2018-12-03 DIAGNOSIS — J449 Chronic obstructive pulmonary disease, unspecified: Secondary | ICD-10-CM

## 2018-12-03 DIAGNOSIS — G4733 Obstructive sleep apnea (adult) (pediatric): Secondary | ICD-10-CM

## 2018-12-03 DIAGNOSIS — J42 Unspecified chronic bronchitis: Secondary | ICD-10-CM

## 2018-12-03 NOTE — Chronic Care Management (AMB) (Signed)
Chronic Care Management   Note  12/03/2018 Name: Kristen Schroeder MRN: [id number] DOB: 06/03/1941   78 y.o. year old female referred to [facility] pharmacist for medication assistance.   Referral source: Dr. Rosanna Randy Referral medication(s): Ruthe Mannan Current insurance: [place] Medicare Advantage plan Currently receiving Extra Help:  '[]'  Yes '[]'  No '[x]'  Unknown  Subjective: "I only have seven doses left on my current Dulera inhaler"   Objective: Medications Reviewed Today    Reviewed by Cathi Roan, [facility] (Pharmacist) on 12/03/18 at [place] List Status: <None>  Medication Order Taking? Sig Documenting Provider Last Dose Status Informant  arformoterol (BROVANA) 15 MCG/2ML NEBU [id number]  Take 2 mLs (15 mcg total) by nebulization 2 (two) times daily.  Patient not taking:  Reported on 10/15/2018   Jerrol Banana., MD  Active Self           Med Note [facility], Clois Dupes   Wed Oct 15, 2018  1:42 PM) Patient states she does not have this medication  aspirin EC 81 MG tablet [id number]  Take 81 mg by mouth daily. [provider]  Active Self  azithromycin (ZITHROMAX) 250 MG tablet [id number]  TAKE 2 TABLETS BY MOUTH TODAY, THEN TAKE 1 TABLET DAILY FOR 4 DAYS Jerrol Banana., MD  Active   blood glucose meter kit and supplies KIT [id number]  Dispense based on patient and insurance preference. Use up to four times daily as directed. (FOR ICD-9 250.00, 250.01). Jerrol Banana., MD  Active Self  Blood Glucose Monitoring Suppl (ONE TOUCH ULTRA 2) w/Device KIT [id number]  1 each by Does not apply route daily. Jerrol Banana., MD  Active Self  fluticasone [facility] 50 MCG/ACT nasal spray [id number]  Place 1 spray into both nostrils daily as needed.  [provider]  Active Self  furosemide (LASIX) 20 MG tablet [id number]  Take 1 tablet (20 mg total) by mouth daily. Jerrol Banana., MD  Active Self  gabapentin (NEURONTIN) 300 MG capsule [id number]  2 tablets in the  morning, 1 tablet in the afternoon and 2 tablets in the evening Jerrol Banana., MD  Active Self  glucose blood (ONE TOUCH ULTRA TEST) test strip [id number]  USE 1 STRIP 2 TIMES DAILY AND AS NEEDED Jerrol Banana., MD  Active Self  HYDROcodone-acetaminophen [facility]) 10-325 MG tablet [id number]  Take by mouth. [provider]  Active            Med Note Rollene Rotunda, Clois Dupes   Wed Oct 15, 2018  1:45 PM) Patient no longer has this medication  ipratropium-albuterol (DUONEB) 0.5-2.5 (3) MG/3ML SOLN [id number]  INHALE 1 VIAL VIA NEBULIZER EVERY 6 HOURS AS NEEDED. Allyne Gee, MD  Active Self           Med Note Rollene Rotunda, Clois Dupes   Wed Oct 15, 2018  1:45 PM) Patient takes 3 to 4 times daily  isosorbide mononitrate (IMDUR) 30 MG 24 hr tablet [id number]  Take 30 mg by mouth daily as needed (high bp). [provider]  Active Self           Med Note Rollene Rotunda, Clois Dupes   Wed Oct 15, 2018  1:47 PM) Patient states she takes this medication every day. Patient states her pressures at home run 130s. See last 3 BP  levothyroxine (SYNTHROID, LEVOTHROID) 125 MCG tablet [id number]  TAKE 1 TABLET BY MOUTH DAILY Jerrol Banana., MD  Active  Self  lisinopril (PRINIVIL,ZESTRIL) 20 MG tablet [id number]  TAKE 1 TABLET BY MOUTH EVERY DAY Jerrol Banana., MD  Active   loratadine (CLARITIN) 10 MG tablet [id number]  TAKE 1 TABLET (10 MG TOTAL) BY MOUTH DAILY. Jerrol Banana., MD  Active Self           Med Note [facility], PORTIA E   Wed Oct 15, 2018  1:48 PM) Takes as needed  LORazepam (ATIVAN) 0.5 MG tablet [id number]  Take 1 tablet (0.5 mg total) by mouth 2 (two) times daily as needed for anxiety. Jerrol Banana., MD  Active Self           Med Note [facility], PORTIA E   Wed Oct 15, 2018  1:48 PM) Has not taken "in a while"  metFORMIN (GLUCOPHAGE) 1000 MG tablet [id number]  TAKE 1 TABLET BY MOUTH TWICE A DAY  Patient taking differently:  Take 1,000 mg by mouth daily with breakfast.     Jerrol Banana., MD  Active Self  mometasone-formoterol [facility]) 100-5 MCG/ACT Hollie Salk [id number] Yes Inhale 2 puffs into the lungs 2 (two) times daily. Lavera Guise, MD Taking Active Self           Med Note Rollene Rotunda, Clois Dupes   Wed Oct 15, 2018  1:54 PM) Patient has one inhaler left with 38 doses. Uses twice a day, sometimes as a rescue but not often. Has 3 symbicort 60 puff each to use until [facility] can be purchased.  MULTIPLE VITAMIN PO [id number]  Take 1 tablet by mouth daily.  [provider]  Active Self           Med Note Samara Snide, Lillia Pauls   Tue Apr 09, 2017  1:30 PM)    omeprazole (PRILOSEC) 40 MG capsule [id number]  Take 1 capsule (40 mg total) by mouth daily. Jerrol Banana., MD  Active Self           Med Note Rollene Rotunda, PORTIA E   Wed Oct 15, 2018  1:54 PM) Taking as needed  ondansetron (ZOFRAN-ODT) 4 MG disintegrating tablet [id number]  TAKE 1 TABLET BY MOUTH EVERY 8 HOURS AS NEEDED FOR NAUSEA AND VOMITING Mar Daring, PA-C  Active Self  oxyCODONE-acetaminophen (PERCOCET/ROXICET) 5-325 MG tablet [id number]  Take 1 tablet by mouth every 6 (six) hours as needed for severe pain. Tylene Fantasia, PA-C  Active            Med Note Rollene Rotunda, Clois Dupes   Wed Oct 15, 2018  1:55 PM) Patient took all this medication and no refills  OXYGEN [id number]  Inhale 4 L into the lungs. [provider]  Active Self           Med Note Rollene Rotunda, Clois Dupes   Wed Oct 15, 2018  1:56 PM) Patient uses 3 to 3 1/2 liters continuously  PARoxetine (PAXIL) 10 MG tablet [id number]  Take 1 tablet (10 mg total) by mouth at bedtime.  Patient not taking:  Reported on 10/15/2018   Jerrol Banana., MD  Active Self           Med Note Rollene Rotunda, Clois Dupes   Wed Oct 15, 2018  2:01 PM) Patient did a self ween and does not feel like she need antidepressant any longer  pravastatin (PRAVACHOL) 10 MG tablet [id number]  TAKE 1 TABLET (10 MG TOTAL) BY MOUTH DAILY. Jerrol Banana., MD  Active Self    roflumilast (  DALIRESP) 500 MCG TABS tablet [id number]  Take by mouth. [provider]  Active            Med Note Rollene Rotunda, Clois Dupes   Wed Oct 15, 2018  2:04 PM) Patient is unsure if she is taking this medication however recent pulmonary visit indicates that she is.  tiotropium (SPIRIVA) 18 MCG inhalation capsule [id number]  Place into inhaler and inhale. [provider]  Active            Med Note Rollene Rotunda, Loma Sender Oct 15, 2018  2:05 PM) Patient will resume taking once dulera inhaler is completed (until she can afford new dulera)          Assessment: Kristen Schroeder is a 78 year old female patient of Dr. Rosanna Randy engaged with CCM for medication assistance (inhalers) and disease state management for CHF, HTN, COPD, chronic bronchitis.   CCM pharmacist previously assisted Kristen Schroeder in applying for Medicare Extra Help based on reported household income (applied 10/21/18) but so far still has not received an approval or denial letter. At this point, we will pursue applying for assistance for [facility] through [employer]. Dr. Chancy Milroy of [facility] is prescriber.   Reviewed documentation necessary for applying to manufacturer's assistance. Kristen Schroeder understanding.   Kristen Schroeder states that based on the MDI of her [facility], she has 7 doses left. She also has 60 doses (unopened/unused) Symbicort inhaler. [facility] currently does not have any Dulera samples. Counseled Kristen Schroeder to please finish her Ruthe Mannan medication (2 puffs BID) and then begin the Symbicort (2 puffs BID). Kristen Schroeder verbalizes understanding   Medication Review Findings:  . Some duplicate entries that Kristen Schroeder is no longer taking.  Medication Assistance Findings:  Extra Help:   '[]'  Already receiving  '[x]'  Eligible based on reported income  '[]'  Not Eligible based on reported income  Patient Assistance Programs: 1) Dulera made by [employer] o Income requirement met: '[x]'  Yes '[]'  No o Troop requirement met: '[x]'  Yes '[]'  No  - No  TROOP requirement   Additional medication assistance options reviewed with patient as warranted including:  Marland Kitchen Mail order programs . Insurance OTC catalogues  Plan: Kristen Schroeder will sign Emergency planning/management officer. CCM pharmacist will inter-office mail application to [facility] (Dr. Chancy Milroy- pulmonology).   Follow up in 1 week or sooner if application is completed.   Ruben Reason, PharmD Clinical Pharmacist [place] [phone]

## 2018-12-08 ENCOUNTER — Ambulatory Visit (INDEPENDENT_AMBULATORY_CARE_PROVIDER_SITE_OTHER): Payer: Medicare Other

## 2018-12-08 ENCOUNTER — Ambulatory Visit (INDEPENDENT_AMBULATORY_CARE_PROVIDER_SITE_OTHER): Payer: Medicare Other | Admitting: Family Medicine

## 2018-12-08 VITALS — BP 128/54 | HR 88 | Temp 98.2°F | Ht 66.0 in | Wt 212.8 lb

## 2018-12-08 VITALS — BP 128/54 | HR 88 | Temp 98.2°F | Wt 212.0 lb

## 2018-12-08 DIAGNOSIS — I10 Essential (primary) hypertension: Secondary | ICD-10-CM

## 2018-12-08 DIAGNOSIS — E7849 Other hyperlipidemia: Secondary | ICD-10-CM

## 2018-12-08 DIAGNOSIS — Z0000 Encounter for general adult medical examination without abnormal findings: Secondary | ICD-10-CM

## 2018-12-08 DIAGNOSIS — E039 Hypothyroidism, unspecified: Secondary | ICD-10-CM

## 2018-12-08 DIAGNOSIS — Z13820 Encounter for screening for osteoporosis: Secondary | ICD-10-CM | POA: Diagnosis not present

## 2018-12-08 DIAGNOSIS — J439 Emphysema, unspecified: Secondary | ICD-10-CM

## 2018-12-08 DIAGNOSIS — E119 Type 2 diabetes mellitus without complications: Secondary | ICD-10-CM

## 2018-12-08 DIAGNOSIS — G4733 Obstructive sleep apnea (adult) (pediatric): Secondary | ICD-10-CM

## 2018-12-08 NOTE — Progress Notes (Signed)
Patient: Kristen Schroeder, Female    DOB: [birth year], 78 y.o.   MRN: [id number] Visit Date: 12/08/2018  Today's Provider: Wilhemena Durie, MD   Chief Complaint  Patient presents with  . Annual Exam   Subjective:     Annual physical visit Kristen Schroeder is a 78 y.o. female. She feels well. She reports exercising none. She reports she is sleeping well.  Colonoscopy- 04/06/2008. Dr. Allen Norris. Entire colon was normal. Repeat 10 years. Mammogram- 10/22/2017. Normal. BMD- 03/02/2008. Pap- 06/03/2013. WNL. H/O abnormal pap.  Overall she is doing well but her COPD seems to be  getting worse.  She needs round-the-clock oxygen.    Review of Systems  Constitutional: Negative.   HENT: Negative.   Eyes: Negative.   Respiratory: Negative.   Cardiovascular: Negative.   Gastrointestinal: Negative.   Endocrine: Negative.   Genitourinary: Negative.   Musculoskeletal: Negative.   Skin: Negative.   Allergic/Immunologic: Negative.   Neurological: Negative.   Hematological: Negative.   Psychiatric/Behavioral: Negative.     Social History   Socioeconomic History  . Marital status: Married    Spouse name: Not on file  . Number of children: 4  . Years of education: Not on file  . Highest education level: 8th grade  Occupational History  . Occupation: retired  Scientific laboratory technician  . Financial resource strain: Not very hard  . Food insecurity:    Worry: Never true    Inability: Never true  . Transportation needs:    Medical: No    Non-medical: No  Tobacco Use  . Smoking status: Former Smoker    Packs/day: 0.50    Years: 15.00    Pack years: 7.50  . Smokeless tobacco: Never Used  . Tobacco comment: 30-40 years ago  Substance and Sexual Activity  . Alcohol use: No    Alcohol/week: 0.0 standard drinks  . Drug use: No  . Sexual activity: Never  Lifestyle  . Physical activity:    Days per week: 0 days    Minutes per session: 0 min  . Stress: Not at all  Relationships  . Social  connections:    Talks on phone: Patient refused    Gets together: Patient refused    Attends religious service: Patient refused    Active member of club or organization: Patient refused    Attends meetings of clubs or organizations: Patient refused    Relationship status: Patient refused  . Intimate partner violence:    Fear of current or ex partner: No    Emotionally abused: No    Physically abused: No    Forced sexual activity: No  Other Topics Concern  . Not on file  Social History Narrative  . Not on file    Past Medical History:  Diagnosis Date  . Arthritis   . COPD (chronic obstructive pulmonary disease) ([place])   . Diabetes mellitus without complication ([place])   . Dysrhythmia   . Heart murmur   . History of orthopnea   . Hypertension   . Hypothyroidism   . Neuropathy   . Oxygen dependent    3L  CONTINUOUS  . Pain CHRONIC BACK PAIN  . Shortness of breath dyspnea   . Wheezing      Patient Active Problem List   Diagnosis Date Noted  . Incarcerated hernia of abdominal cavity 08/04/2018  . SBO (small bowel obstruction) ([place]) 04/22/2018  . Incarcerated hernia 04/14/2018  . Aneurysm ([place]) 07/10/2016  . Nonspecific  abnormal finding 05/11/2015  . Allergic rhinitis 05/11/2015  . Anxiety 05/11/2015  . Bronchitis, chronic ([place]) 05/11/2015  . CCF (congestive cardiac failure) ([place]) 05/11/2015  . Clinical depression 05/11/2015  . 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 Essential (primary) hypertension 05/11/2015  . Acid reflux 05/11/2015  . HLD (hyperlipidemia) 05/11/2015  . Adult hypothyroidism 05/11/2015  . Cervical dysplasia, mild 05/11/2015  . Neuropathy 05/11/2015  . Adiposity 05/11/2015  . Obstructive apnea 05/11/2015  . Arthritis, degenerative 05/11/2015    Past Surgical History:  Procedure Laterality Date  . ABDOMINAL HYSTERECTOMY    . BOWEL RESECTION N/A 04/14/2018   Procedure: SMALL BOWEL RESECTION;  Surgeon: Jules Husbands, MD;   Location: [facility] ORS;  Service: General;  Laterality: N/A;  . CATARACT EXTRACTION W/PHACO Left 07/03/2016   Procedure: CATARACT EXTRACTION PHACO AND INTRAOCULAR LENS PLACEMENT ([place]);  Surgeon: Birder Robson, MD;  Location: [facility] ORS;  Service: Ophthalmology;  Laterality: Left;  Lot: [id number] H [place]: 00:40.1 AP%: 17.4 CDE:6.94  . HERNIA REPAIR    . LAPAROTOMY N/A 09/04/2018   Procedure: EXPLORATORY LAPAROTOMY;  Surgeon: Olean Ree, MD;  Location: [facility] ORS;  Service: General;  Laterality: N/A;  . TUBAL LIGATION    . VENTRAL HERNIA REPAIR N/A 04/14/2018   Procedure: HERNIA REPAIR VENTRAL ADULT;  Surgeon: Jules Husbands, MD;  Location: [facility] ORS;  Service: General;  Laterality: N/A;    Her family history includes Drug abuse in an other family member; Heart disease in her mother; Hypertension in an other family member.   Current Outpatient Medications:  .  arformoterol (BROVANA) 15 MCG/2ML NEBU, Take 2 mLs (15 mcg total) by nebulization 2 (two) times daily., Disp: 120 mL, Rfl: 11 .  aspirin EC 81 MG tablet, Take 81 mg by mouth daily., Disp: , Rfl:  .  azithromycin (ZITHROMAX) 250 MG tablet, TAKE 2 TABLETS BY MOUTH TODAY, THEN TAKE 1 TABLET DAILY FOR 4 DAYS, Disp: 6 tablet, Rfl: 2 .  blood glucose meter kit and supplies KIT, Dispense based on patient and insurance preference. Use up to four times daily as directed. (FOR ICD-9 250.00, 250.01)., Disp: 1 each, Rfl: 0 .  Blood Glucose Monitoring Suppl (ONE TOUCH ULTRA 2) w/Device KIT, 1 each by Does not apply route daily., Disp: 1 each, Rfl: 0 .  fluticasone (FLONASE) 50 MCG/ACT nasal spray, Place 1 spray into both nostrils daily as needed. , Disp: , Rfl:  .  furosemide (LASIX) 20 MG tablet, Take 1 tablet (20 mg total) by mouth daily., Disp: 90 tablet, Rfl: 3 .  gabapentin (NEURONTIN) 300 MG capsule, 2 tablets in the morning, 1 tablet in the afternoon and 2 tablets in the evening, Disp: 450 capsule, Rfl: 3 .  glucose blood (ONE TOUCH ULTRA TEST) test  strip, USE 1 STRIP 2 TIMES DAILY AND AS NEEDED, Disp: 100 each, Rfl: 12 .  HYDROcodone-acetaminophen (NORCO) 10-325 MG tablet, Take by mouth., Disp: , Rfl:  .  ipratropium-albuterol (DUONEB) 0.5-2.5 (3) MG/3ML SOLN, INHALE 1 VIAL VIA NEBULIZER EVERY 6 HOURS AS NEEDED., Disp: 720 mL, Rfl: 3 .  isosorbide mononitrate (IMDUR) 30 MG 24 hr tablet, Take 30 mg by mouth daily as needed (high bp)., Disp: , Rfl:  .  levothyroxine (SYNTHROID, LEVOTHROID) 125 MCG tablet, TAKE 1 TABLET BY MOUTH DAILY, Disp: 90 tablet, Rfl: 3 .  lisinopril (PRINIVIL,ZESTRIL) 20 MG tablet, TAKE 1 TABLET BY MOUTH EVERY DAY, Disp: 90 tablet, Rfl: 3 .  loratadine (CLARITIN) 10 MG tablet, TAKE 1 TABLET (10 MG  TOTAL) BY MOUTH DAILY., Disp: 90 tablet, Rfl: 3 .  LORazepam (ATIVAN) 0.5 MG tablet, Take 1 tablet (0.5 mg total) by mouth 2 (two) times daily as needed for anxiety. (Patient not taking: Reported on 12/08/2018), Disp: 180 tablet, Rfl: 3 .  metFORMIN (GLUCOPHAGE) 1000 MG tablet, TAKE 1 TABLET BY MOUTH TWICE A DAY (Patient taking differently: Take 1,000 mg by mouth daily with breakfast. ), Disp: 180 tablet, Rfl: 3 .  mometasone-formoterol (DULERA) 100-5 MCG/ACT AERO, Inhale 2 puffs into the lungs 2 (two) times daily., Disp: 6 Inhaler, Rfl: 3 .  MULTIPLE VITAMIN PO, Take 1 tablet by mouth daily. , Disp: , Rfl:  .  omeprazole (PRILOSEC) 40 MG capsule, Take 1 capsule (40 mg total) by mouth daily., Disp: 90 capsule, Rfl: 1 .  ondansetron (ZOFRAN-ODT) 4 MG disintegrating tablet, TAKE 1 TABLET BY MOUTH EVERY 8 HOURS AS NEEDED FOR NAUSEA AND VOMITING, Disp: 20 tablet, Rfl: 3 .  oxyCODONE-acetaminophen (PERCOCET/ROXICET) 5-325 MG tablet, Take 1 tablet by mouth every 6 (six) hours as needed for severe pain., Disp: 20 tablet, Rfl: 0 .  OXYGEN, Inhale 4 L into the lungs., Disp: , Rfl:  .  PARoxetine (PAXIL) 10 MG tablet, Take 1 tablet (10 mg total) by mouth at bedtime. (Patient not taking: Reported on 12/08/2018), Disp: 90 tablet, Rfl: 3 .   pravastatin (PRAVACHOL) 10 MG tablet, TAKE 1 TABLET (10 MG TOTAL) BY MOUTH DAILY., Disp: 90 tablet, Rfl: 3 .  roflumilast (DALIRESP) 500 MCG TABS tablet, Take by mouth., Disp: , Rfl:  .  tiotropium (SPIRIVA) 18 MCG inhalation capsule, Place into inhaler and inhale., Disp: , Rfl:   Patient Care Team: Jerrol Banana., MD as PCP - General (Family Medicine) Birder Robson, MD as Referring Physician (Ophthalmology) Yolonda Kida, MD as Consulting Physician (Cardiology) Benedetto Goad, RN as Case Manager Cathi Roan, [facility] (Pharmacist) Allyne Gee, MD as Consulting Physician (Internal Medicine)    Objective:    Vitals: BP (!) 128/54   Pulse 88   Wt 212 lb (96.2 kg)   BMI 34.22 kg/m   Physical Exam Constitutional:      Appearance: Normal appearance. She is well-developed. She is obese.  HENT:     Head: Normocephalic and atraumatic.     Right Ear: External ear normal.     Left Ear: External ear normal.     Nose: Nose normal.     Mouth/Throat:     Pharynx: Oropharynx is clear.  Eyes:     General: No scleral icterus.    Conjunctiva/sclera: Conjunctivae normal.     Pupils: Pupils are equal, round, and reactive to light.  Neck:     Musculoskeletal: Neck supple.  Cardiovascular:     Rate and Rhythm: Normal rate and regular rhythm.     Heart sounds: Normal heart sounds.  Pulmonary:     Effort: Pulmonary effort is normal.     Breath sounds: Normal breath sounds.  Abdominal:     Palpations: Abdomen is soft.  Musculoskeletal:     Right lower leg: No edema.     Left lower leg: No edema.  Skin:    General: Skin is warm and dry.  Neurological:     Mental Status: She is alert and oriented to person, place, and time.  Psychiatric:        Mood and Affect: Mood normal.        Behavior: Behavior normal.        Thought Content:  Thought content normal.        Judgment: Judgment normal.     Activities of Daily Living In your present state of health, do you have  any difficulty performing the following activities: 12/08/2018 10/15/2018  Hearing? N N  Vision? N N  Comment Wears readers as needed.  -  Difficulty concentrating or making decisions? N N  Walking or climbing stairs? Y Y  Comment Due to SOB.  related to shortness of breath and decreased mobility  Dressing or bathing? N N  Doing errands, shopping? N Y  Comment - patient requires a power chair for long shoping trips  Preparing Food and eating ? N Y  Comment Has trouble standing for periods of time. Does minimal cooking.  related to her inability to stand for long periods of time  Using the Toilet? N N  In the past six months, have you accidently leaked urine? N Y  Do you have problems with loss of bowel control? N N  Managing your Medications? N N  Managing your Finances? N N  Housekeeping or managing your Housekeeping? Y Y  Comment Has assistance cleaning.  -  Some recent data might be hidden    Fall Risk Assessment Fall Risk  12/08/2018 10/15/2018 09/19/2018 05/29/2018 01/28/2018  Falls in the past year? 0 0 0 No No     Depression Screen PHQ 2/9 Scores 12/08/2018 05/29/2018 01/28/2018 12/04/2017  PHQ - 2 Score 0 0 0 0  PHQ- 9 Score - - - 2    6CIT Screen 12/08/2018  What Year? 0 points  What month? 0 points  What time? 0 points  Count back from 20 0 points  Months in reverse 0 points  Repeat phrase 2 points  Total Score 2      Assessment & Plan:     Annual Physical Visit Overall health is fair.  Follow-up on mammogram but would not follow upcoming colonoscopy or any invasive exams. No Pap smear needed.  No pelvic exam done any symptoms due to overall fair to poor health. Reviewed patient's Family Medical History Reviewed and updated list of patient's medical providers Assessment of cognitive impairment was done Assessed patient's functional ability Established a written schedule for health screening [place] Completed and Reviewed  Exercise  Activities and Dietary recommendations Goals    . "I can't afford my healthcare bills" (pt-stated)     Current Barriers:  . Limited social security income . High cost medications   Nurse Case Manager Clinical Goal(s): Over the next 2 patient will apply for financial assistance with [place] medial Bills with the assistance of the CCM Team  Interventions:  Marland Kitchen Verified combined monthly income of patient and spouse . Discussed property reduction application   Patient Self Care Activities:  . Patient will gather proof of income documents provided by social security and present to CCM Team   Please see past updates related to this goal by clicking on the "Past Updates" button in the selected goal      . "I can't afford this [place]" (pt-stated)      Clinical Goals: (updated) Over the next 14 days, patient will engage with [facility] Pharmacist for medication management and assistance  Interventions:  12/03/17: CCM pharmacist initiates an application to [employer] for [employer] patient assistance    Please see past updates related to this goal by clicking on the "Past Updates" button in the selected goal      . DIET - REDUCE  PORTION SIZE     Recommend to continue with current diet regimen of cutting portion sizes in half and eating 3 healthy meals a day with 2 healthy snacks in between.     . Exercise 3x per week (30 min per time)     Recommend to exercise (walk) for 3 days a week for at least 30 minutes at a time.     . Increase water intake     Starting 10/17/16, I will increase my water intake from 5 glasses a day to 6 glasses.       Immunization History  Administered Date(s) Administered  . Influenza Split 07/23/2012  . Influenza, High Dose Seasonal PF 08/24/2014, 08/18/2015, 08/28/2016, 07/11/2017, 09/24/2018  . Influenza,inj,Quad PF,6+ Mos 07/29/2013  . Pneumococcal Conjugate-13 08/24/2014  . Pneumococcal Polysaccharide-23 11/03/1999, 07/23/2012  . Tdap 07/23/2012  .  Zoster 07/23/2012    Health Maintenance  Topic Date Due  . DEXA SCAN  03/02/2018  . FOOT EXAM  08/26/2018  . HEMOGLOBIN A1C  03/25/2019  . OPHTHALMOLOGY EXAM  06/14/2019  . TETANUS/TDAP  07/23/2022  . INFLUENZA VACCINE  Completed  . PNA vac Low Risk Adult  Completed     Discussed health benefits of physical activity, and encouraged her to engage in regular exercise appropriate for her age and condition.  1. Encounter for annual physical examination excluding gynecological examination in a patient older than 17 years   2. Type 2 diabetes mellitus without complication, without long-term current use of insulin ([place]) To clinic 3 to 4 months  3. Essential (primary) hypertension Controlled - Comprehensive metabolic panel  4. Adult hypothyroidism  - CBC with Differential/Platelet - TSH  5. Other hyperlipidemia  - Lipid panel  6. OSA (obstructive sleep apnea) Patient not using CPAP. 7. Morbid obesity ([place])   8. Pulmonary emphysema, unspecified emphysema type ([place]) Per Dr Humphrey Rolls.   I have done the exam and reviewed the above chart and it is accurate to the best of my knowledge. Development worker, community has been used in this note in any air is in the dictation or transcription are unintentional.  Wilhemena Durie, MD  [place]

## 2018-12-08 NOTE — Patient Instructions (Addendum)
[name] , Thank you for taking time to come for your Medicare Wellness Visit. I appreciate your ongoing commitment to your health goals. Please review the following plan we discussed and let me know if I can assist you in the future.   Screening recommendations/referrals: Colonoscopy: No longer required.  Mammogram: No longer required.  Bone Density: Ordered today. Pt aware the office will call re: appt. Recommended yearly ophthalmology/optometry visit for glaucoma screening and checkup Recommended yearly dental visit for hygiene and checkup  Vaccinations: Influenza vaccine: Up to date Pneumococcal vaccine: Completed series Tdap vaccine: Up to date, due 07/2022  Shingles vaccine: Pt declines today.    Advanced directives: Please bring a copy of your POA (Power of Attorney) and/or Living Will to your next appointment.   Conditions/risks identified: Obesity- recommend to exercise (walk) for 3 days a week for at least 30 minutes at a time.   Next appointment: 10:40 AM with Dr Sullivan Lone.    Preventive Care 101 Years and Older, Female Preventive care refers to lifestyle choices and visits with your health care provider that can promote health and wellness. What does preventive care include?  A yearly physical exam. This is also called an annual well check.  Dental exams once or twice a year.  Routine eye exams. Ask your health care provider how often you should have your eyes checked.  Personal lifestyle choices, including:  Daily care of your teeth and gums.  Regular physical activity.  Eating a healthy diet.  Avoiding tobacco and drug use.  Limiting alcohol use.  Practicing safe sex.  Taking low-dose aspirin every day.  Taking vitamin and mineral supplements as recommended by your health care provider. What happens during an annual well check? The services and screenings done by your health care provider during your annual well check will depend on your age, overall  health, lifestyle risk factors, and family history of disease. Counseling  Your health care provider may ask you questions about your:  Alcohol use.  Tobacco use.  Drug use.  Emotional well-being.  Home and relationship well-being.  Sexual activity.  Eating habits.  History of falls.  Memory and ability to understand (cognition).  Work and work Astronomer.  Reproductive health. Screening  You may have the following tests or measurements:  Height, weight, and BMI.  Blood pressure.  Lipid and cholesterol levels. These may be checked every 5 years, or more frequently if you are over 57 years old.  Skin check.  Lung cancer screening. You may have this screening every year starting at age 25 if you have a 30-pack-year history of smoking and currently smoke or have quit within the past 15 years.  Fecal occult blood test (FOBT) of the stool. You may have this test every year starting at age 16.  Flexible sigmoidoscopy or colonoscopy. You may have a sigmoidoscopy every 5 years or a colonoscopy every 10 years starting at age 30.  Hepatitis C blood test.  Hepatitis B blood test.  Sexually transmitted disease (STD) testing.  Diabetes screening. This is done by checking your blood sugar (glucose) after you have not eaten for a while (fasting). You may have this done every 1-3 years.  Bone density scan. This is done to screen for osteoporosis. You may have this done starting at age 32.  Mammogram. This may be done every 1-2 years. Talk to your health care provider about how often you should have regular mammograms. Talk with your health care provider about your test results,  treatment options, and if necessary, the need for more tests. Vaccines  Your health care provider may recommend certain vaccines, such as:  Influenza vaccine. This is recommended every year.  Tetanus, diphtheria, and acellular pertussis (Tdap, Td) vaccine. You may need a Td booster every 10  years.  Zoster vaccine. You may need this after age 20.  Pneumococcal 13-valent conjugate (PCV13) vaccine. One dose is recommended after age 16.  Pneumococcal polysaccharide (PPSV23) vaccine. One dose is recommended after age 7. Talk to your health care provider about which screenings and vaccines you need and how often you need them. This information is not intended to replace advice given to you by your health care provider. Make sure you discuss any questions you have with your health care provider. Document Released: 11/18/2015 Document Revised: 07/11/2016 Document Reviewed: 08/23/2015 Elsevier Interactive Patient Education  2017 [place] Prevention in the Home Falls can cause injuries. They can happen to people of all ages. There are many things you can do to make your home safe and to help prevent falls. What can I do on the outside of my home?  Regularly fix the edges of walkways and driveways and fix any cracks.  Remove anything that might make you trip as you walk through a door, such as a raised step or threshold.  Trim any bushes or trees on the path to your home.  Use bright outdoor lighting.  Clear any walking paths of anything that might make someone trip, such as rocks or tools.  Regularly check to see if handrails are loose or broken. Make sure that both sides of any steps have handrails.  Any raised decks and porches should have guardrails on the edges.  Have any leaves, snow, or ice cleared regularly.  Use sand or salt on walking paths during winter.  Clean up any spills in your garage right away. This includes oil or grease spills. What can I do in the bathroom?  Use night lights.  Install grab bars by the toilet and in the tub and shower. Do not use towel bars as grab bars.  Use non-skid mats or decals in the tub or shower.  If you need to sit down in the shower, use a plastic, non-slip stool.  Keep the floor dry. Clean up any water that  spills on the floor as soon as it happens.  Remove soap buildup in the tub or shower regularly.  Attach bath mats securely with double-sided non-slip rug tape.  Do not have throw rugs and other things on the floor that can make you trip. What can I do in the bedroom?  Use night lights.  Make sure that you have a light by your bed that is easy to reach.  Do not use any sheets or blankets that are too big for your bed. They should not hang down onto the floor.  Have a firm chair that has side arms. You can use this for support while you get dressed.  Do not have throw rugs and other things on the floor that can make you trip. What can I do in the kitchen?  Clean up any spills right away.  Avoid walking on wet floors.  Keep items that you use a lot in easy-to-reach places.  If you need to reach something above you, use a strong step stool that has a grab bar.  Keep electrical cords out of the way.  Do not use floor polish or wax that makes floors  slippery. If you must use wax, use non-skid floor wax.  Do not have throw rugs and other things on the floor that can make you trip. What can I do with my stairs?  Do not leave any items on the stairs.  Make sure that there are handrails on both sides of the stairs and use them. Fix handrails that are broken or loose. Make sure that handrails are as long as the stairways.  Check any carpeting to make sure that it is firmly attached to the stairs. Fix any carpet that is loose or worn.  Avoid having throw rugs at the top or bottom of the stairs. If you do have throw rugs, attach them to the floor with carpet tape.  Make sure that you have a light switch at the top of the stairs and the bottom of the stairs. If you do not have them, ask someone to add them for you. What else can I do to help prevent falls?  Wear shoes that:  Do not have high heels.  Have rubber bottoms.  Are comfortable and fit you well.  Are closed at the  toe. Do not wear sandals.  If you use a stepladder:  Make sure that it is fully opened. Do not climb a closed stepladder.  Make sure that both sides of the stepladder are locked into place.  Ask someone to hold it for you, if possible.  Clearly mark and make sure that you can see:  Any grab bars or handrails.  First and last steps.  Where the edge of each step is.  Use tools that help you move around (mobility aids) if they are needed. These include:  Canes.  Walkers.  Scooters.  Crutches.  Turn on the lights when you go into a dark area. Replace any light bulbs as soon as they burn out.  Set up your furniture so you have a clear path. Avoid moving your furniture around.  If any of your floors are uneven, fix them.  If there are any pets around you, be aware of where they are.  Review your medicines with your doctor. Some medicines can make you feel dizzy. This can increase your chance of falling. Ask your doctor what other things that you can do to help prevent falls. This information is not intended to replace advice given to you by your health care provider. Make sure you discuss any questions you have with your health care provider. Document Released: 08/18/2009 Document Revised: 03/29/2016 Document Reviewed: 11/26/2014 Elsevier Interactive Patient Education  2017 [employer].

## 2018-12-08 NOTE — Progress Notes (Signed)
Subjective:   Kristen Schroeder is a 78 y.o. female who presents for Medicare Annual (Subsequent) preventive examination.  Review of Systems:  N/A  Cardiac Risk Factors include: advanced age (>78mn, >>51women);diabetes mellitus;dyslipidemia;hypertension;obesity (BMI >30kg/m2)     Objective:     Vitals: BP (!) 128/54 (BP Location: Right Arm)   Pulse 88   Temp 98.2 F (36.8 C) (Oral)   Ht _0  (1.676 m)   Wt 212 lb 12.8 oz (96.5 kg)   SpO2 93%   BMI 34.35 kg/m   Body mass index is 34.35 kg/m.  Advanced Directives 12/08/2018 10/15/2018 09/04/2018 09/01/2018 09/01/2018 04/22/2018 04/14/2018  Does Patient Have a Medical Advance Directive? _1  Yes Yes  Type of AParamedicof [place]al[place]Living will [place] will Living will Living will Living will [place] will [place] will  Does patient want to make changes to medical advance directive? - No - Patient declined No - Patient declined No - Patient declined No - Patient declined No - Patient declined No - Patient declined  Copy of HCran[place]in Chart? No - copy requested No - copy requested - - - No - copy requested -  Would patient like information on creating a medical advance directive? - - - - - - -    Tobacco Social History   Tobacco Use  Smoking Status Former Smoker  . Packs/day: 0.50  . Years: 15.00  . Pack years: 7.50  Smokeless Tobacco Never Used  Tobacco Comment   30-40 years ago     Counseling given: Not Answered Comment: 30-40 years ago   Clinical Intake:  Pre-visit preparation completed: Yes  Pain : No/denies pain Pain Score: 0-No pain    Diabetes:  Is the patient diabetic?  Yes type 2  If diabetic, was a CBG obtained today?  No  Did the patient bring in their glucometer from home?  No  How often do you monitor your CBG's? Once every other day.   Financial Strains and  Diabetes Management:  Are you having any financial strains with the device, your supplies or your medication? Yes, medications have increased in price. Pt is currently getting assistance from the C3 team. .  Does the patient want to be seen by Chronic Care Management for management of their diabetes?  No  Would the patient like to be referred to a Nutritionist or for Diabetic Management?  No   Diabetic Exams:  Diabetic Eye Exam: Completed 06/13/18.   Diabetic Foot Exam: Completed 08/26/17. Pt has been advised about the importance in completing this exam. Note made to have this completed today with PCP.    Nutritional Status: BMI > 30  Obese Nutritional Risks: None   How often do you need to have someone help you when you read instructions, pamphlets, or other written materials from your doctor or pharmacy?: 1 - Never  Interpreter Needed?: No  Information entered by :: [facility] LPN  Past Medical History:  Diagnosis Date  . Arthritis   . COPD (chronic obstructive pulmonary disease) ([place]   . Diabetes mellitus without complication ([place]   . Dysrhythmia   . Heart murmur   . History of orthopnea   . Hypertension   . Hypothyroidism   . Neuropathy   . Oxygen dependent    3L  CONTINUOUS  . Pain CHRONIC BACK PAIN  . Shortness of breath dyspnea   . Wheezing  Past Surgical History:  Procedure Laterality Date  . ABDOMINAL HYSTERECTOMY    . BOWEL RESECTION N/A 04/14/2018   Procedure: SMALL BOWEL RESECTION;  Surgeon: Jules Husbands, MD;  Location: [facility] ORS;  Service: General;  Laterality: N/A;  . CATARACT EXTRACTION W/PHACO Left 07/03/2016   Procedure: CATARACT EXTRACTION PHACO AND INTRAOCULAR LENS PLACEMENT ([place]);  Surgeon: Birder Robson, MD;  Location: [facility] ORS;  Service: Ophthalmology;  Laterality: Left;  Lot: [id number] H [place]: 00:40.1 AP%: 17.4 CDE:6.94  . HERNIA REPAIR    . LAPAROTOMY N/A 09/04/2018   Procedure: EXPLORATORY LAPAROTOMY;  Surgeon: Olean Ree, MD;   Location: [facility] ORS;  Service: General;  Laterality: N/A;  . TUBAL LIGATION    . VENTRAL HERNIA REPAIR N/A 04/14/2018   Procedure: HERNIA REPAIR VENTRAL ADULT;  Surgeon: Jules Husbands, MD;  Location: [facility] ORS;  Service: General;  Laterality: N/A;   Family History  Problem Relation Age of Onset  . Heart disease Mother   . Drug abuse Other   . Hypertension Other    Social History   Socioeconomic History  . Marital status: Married    Spouse name: Not on file  . Number of children: 4  . Years of education: Not on file  . Highest education level: 8th grade  Occupational History  . Occupation: retired  Scientific laboratory technician  . Financial resource strain: Not very hard  . Food insecurity:    Worry: Never true    Inability: Never true  . Transportation needs:    Medical: No    Non-medical: No  Tobacco Use  . Smoking status: Former Smoker    Packs/day: 0.50    Years: 15.00    Pack years: 7.50  . Smokeless tobacco: Never Used  . Tobacco comment: 30-40 years ago  Substance and Sexual Activity  . Alcohol use: No    Alcohol/week: 0.0 standard drinks  . Drug use: No  . Sexual activity: Never  Lifestyle  . Physical activity:    Days per week: 0 days    Minutes per session: 0 min  . Stress: Not at all  Relationships  . Social connections:    Talks on phone: Patient refused    Gets together: Patient refused    Attends religious service: Patient refused    Active member of club or organization: Patient refused    Attends meetings of clubs or organizations: Patient refused    Relationship status: Patient refused  Other Topics Concern  . Not on file  Social History Narrative  . Not on file    Outpatient Encounter Medications as of 12/08/2018  Medication Sig  . arformoterol (BROVANA) 15 MCG/2ML NEBU Take 2 mLs (15 mcg total) by nebulization 2 (two) times daily.  Marland Kitchen aspirin EC 81 MG tablet Take 81 mg by mouth daily.  Marland Kitchen azithromycin (ZITHROMAX) 250 MG tablet TAKE 2 TABLETS BY MOUTH TODAY,  THEN TAKE 1 TABLET DAILY FOR 4 DAYS  . blood glucose meter kit and supplies KIT Dispense based on patient and insurance preference. Use up to four times daily as directed. (FOR ICD-9 250.00, 250.01).  . Blood Glucose Monitoring Suppl (ONE TOUCH ULTRA 2) w/Device KIT 1 each by Does not apply route daily.  . fluticasone (FLONASE) 50 MCG/ACT nasal spray Place 1 spray into both nostrils daily as needed.   . furosemide (LASIX) 20 MG tablet Take 1 tablet (20 mg total) by mouth daily.  Marland Kitchen gabapentin (NEURONTIN) 300 MG capsule 2 tablets in the morning, 1 tablet in the  afternoon and 2 tablets in the evening  . glucose blood (ONE TOUCH ULTRA TEST) test strip USE 1 STRIP 2 TIMES DAILY AND AS NEEDED  . HYDROcodone-acetaminophen (NORCO) 10-325 MG tablet Take by mouth.  Marland Kitchen ipratropium-albuterol (DUONEB) 0.5-2.5 (3) MG/3ML SOLN INHALE 1 VIAL VIA NEBULIZER EVERY 6 HOURS AS NEEDED.  Marland Kitchen isosorbide mononitrate (IMDUR) 30 MG 24 hr tablet Take 30 mg by mouth daily as needed (high bp).  Marland Kitchen levothyroxine (SYNTHROID, LEVOTHROID) 125 MCG tablet TAKE 1 TABLET BY MOUTH DAILY  . lisinopril (PRINIVIL,ZESTRIL) 20 MG tablet TAKE 1 TABLET BY MOUTH EVERY DAY  . loratadine (CLARITIN) 10 MG tablet TAKE 1 TABLET (10 MG TOTAL) BY MOUTH DAILY.  . metFORMIN (GLUCOPHAGE) 1000 MG tablet TAKE 1 TABLET BY MOUTH TWICE A DAY (Patient taking differently: Take 1,000 mg by mouth daily with breakfast. )  . mometasone-formoterol (DULERA) 100-5 MCG/ACT AERO Inhale 2 puffs into the lungs 2 (two) times daily.  . MULTIPLE VITAMIN PO Take 1 tablet by mouth daily.   Marland Kitchen omeprazole (PRILOSEC) 40 MG capsule Take 1 capsule (40 mg total) by mouth daily.  . ondansetron (ZOFRAN-ODT) 4 MG disintegrating tablet TAKE 1 TABLET BY MOUTH EVERY 8 HOURS AS NEEDED FOR NAUSEA AND VOMITING  . oxyCODONE-acetaminophen (PERCOCET/ROXICET) 5-325 MG tablet Take 1 tablet by mouth every 6 (six) hours as needed for severe pain.  . OXYGEN Inhale 4 L into the lungs.  . pravastatin  (PRAVACHOL) 10 MG tablet TAKE 1 TABLET (10 MG TOTAL) BY MOUTH DAILY.  . roflumilast (DALIRESP) 500 MCG TABS tablet Take by mouth.  . tiotropium (SPIRIVA) 18 MCG inhalation capsule Place into inhaler and inhale.  Marland Kitchen LORazepam (ATIVAN) 0.5 MG tablet Take 1 tablet (0.5 mg total) by mouth 2 (two) times daily as needed for anxiety. (Patient not taking: Reported on 12/08/2018)  . PARoxetine (PAXIL) 10 MG tablet Take 1 tablet (10 mg total) by mouth at bedtime. (Patient not taking: Reported on 12/08/2018)   No facility-administered encounter medications on file as of 12/08/2018.     Activities of Daily Living In your present state of health, do you have any difficulty performing the following activities: 12/08/2018 10/15/2018  Hearing? N N  Vision? N N  Comment Wears readers as needed.  -  Difficulty concentrating or making decisions? N N  Walking or climbing stairs? Y Y  Comment Due to SOB.  related to shortness of breath and decreased mobility  Dressing or bathing? N N  Doing errands, shopping? N Y  Comment - patient requires a power chair for long shoping trips  Preparing Food and eating ? N Y  Comment Has trouble standing for periods of time. Does minimal cooking.  related to her inability to stand for long periods of time  Using the Toilet? N N  In the past six months, have you accidently leaked urine? N Y  Do you have problems with loss of bowel control? N N  Managing your Medications? N N  Managing your Finances? N N  Housekeeping or managing your Housekeeping? Tempie Donning  Comment Has assistance cleaning.  -  Some recent data might be hidden    Patient Care Team: Jerrol Banana., MD as PCP - General (Family Medicine) Birder Robson, MD as Referring Physician (Ophthalmology) Yolonda Kida, MD as Consulting Physician (Cardiology) Benedetto Goad, RN as Case Manager Cathi Roan, [facility] (Pharmacist) Allyne Gee, MD as Consulting Physician (Internal Medicine)    Assessment:    This is a  routine wellness examination for Kessie.  Exercise Activities and Dietary recommendations Current Exercise Habits: Home exercise routine, Type of exercise: walking;Other - see comments(chair exercises), Time (Minutes): 10(to 15 minutes at a time), Frequency (Times/Week): 4, Weekly Exercise (Minutes/Week): 40, Intensity: Mild, Exercise limited by: respiratory conditions(s)  Goals      Patient Stated   . "I can't afford my healthcare bills" (pt-stated)     Current Barriers:  . Limited social security income . High cost medications   Nurse Case Manager Clinical Goal(s): Over the next 2 patient will apply for financial assistance with [place] medial Bills with the assistance of the CCM Team  Interventions:  Marland Kitchen Verified combined monthly income of patient and spouse . Discussed property reduction application   Patient Self Care Activities:  . Patient will gather proof of income documents provided by social security and present to CCM Team   Please see past updates related to this goal by clicking on the "Past Updates" button in the selected goal      . "I can't afford this Dulera" (pt-stated)      Clinical Goals: (updated) Over the next 14 days, patient will engage with [facility] Pharmacist for medication management and assistance  Interventions:  12/03/17: CCM pharmacist initiates an application to [employer] for [facility] patient assistance    Please see past updates related to this goal by clicking on the "Past Updates" button in the selected goal        Other   . DIET - REDUCE PORTION SIZE     Recommend to continue with current diet regimen of cutting portion sizes in half and eating 3 healthy meals a day with 2 healthy snacks in between.     . Exercise 3x per week (30 min per time)     Recommend to exercise (walk) for 3 days a week for at least 30 minutes at a time.     . Increase water intake     Starting 10/17/16, I will increase my water intake from 5 glasses a  day to 6 glasses.       Fall Risk Fall Risk  12/08/2018 10/15/2018 09/19/2018 05/29/2018 01/28/2018  Falls in the past year? 0 0 0 No No   FALL RISK PREVENTION PERTAINING TO THE HOME:  Any stairs in or around the home WITH handrails? No  Home free of loose throw rugs in walkways, pet beds, electrical cords, etc? Yes  Adequate lighting in your home to reduce risk of falls? Yes   ASSISTIVE DEVICES UTILIZED TO PREVENT FALLS:  Life alert? No  Use of a cane, walker or w/c? Yes  Grab bars in the bathroom? Yes  Shower chair or bench in shower? Yes  Elevated toilet seat or a handicapped toilet? Yes    TIMED UP AND GO:  Was the test performed? No .     Depression Screen PHQ 2/9 Scores 12/08/2018 05/29/2018 01/28/2018 12/04/2017  PHQ - 2 Score 0 0 0 0  PHQ- 9 Score - - - 2     Cognitive Function     6CIT Screen 12/08/2018 12/04/2017 10/17/2016  What Year? 0 points 0 points 0 points  What month? 0 points 0 points 0 points  What time? 0 points 0 points 0 points  Count back from 20 0 points 0 points 4 points  Months in reverse 0 points 0 points 0 points  Repeat phrase 2 points 0 points 2 points  Total Score 2 0 6  Immunization History  Administered Date(s) Administered  . Influenza Split 07/23/2012  . Influenza, High Dose Seasonal PF 08/24/2014, 08/18/2015, 08/28/2016, 07/11/2017, 09/24/2018  . Influenza,inj,Quad PF,6+ Mos 07/29/2013  . Pneumococcal Conjugate-13 08/24/2014  . Pneumococcal Polysaccharide-23 11/03/1999, 07/23/2012  . Tdap 07/23/2012  . Zoster 07/23/2012    Qualifies for Shingles Vaccine? Yes, Zostavax completed 07/13/12. Due for Shingrix. Education has been provided regarding the importance of this vaccine. Pt has been advised to call insurance company to determine out of pocket expense. Advised may also receive vaccine at local pharmacy or Health Dept. Verbalized acceptance and understanding.  Tdap: Up to date  Flu Vaccine: Up to date  Pneumococcal Vaccine:  Up to date   Screening Tests Health Maintenance  Topic Date Due  . DEXA SCAN  03/02/2018  . FOOT EXAM  08/26/2018  . HEMOGLOBIN A1C  03/25/2019  . OPHTHALMOLOGY EXAM  06/14/2019  . TETANUS/TDAP  07/23/2022  . INFLUENZA VACCINE  Completed  . PNA vac Low Risk Adult  Completed    Cancer Screenings:  Colorectal Screening: No longer required.   Mammogram: No longer required.   Bone Density: Completed 03/02/08. Results reflect NORMAL. Repeat every 10 years. Ordered today. Pt aware the office will call re: appt.  Lung Cancer Screening: (Low Dose CT Chest recommended if Age 44-80 years, 30 pack-year currently smoking OR have quit w/in 15years.) does not qualify.    Additional Screening:  Vision Screening: Recommended annual ophthalmology exams for early detection of glaucoma and other disorders of the eye.  Dental Screening: Recommended annual dental exams for proper oral hygiene  Community Resource Referral:  CRR required this visit?  No       Plan:  I have personally reviewed and addressed the Medicare Annual Wellness questionnaire and have noted the following in the patient's chart:  A. Medical and social history B. Use of alcohol, tobacco or illicit drugs  C. Current medications and supplements D. Functional ability and status E.  Nutritional status F.  Physical activity G. Advance directives H. List of other physicians I.  Hospitalizations, surgeries, and ER visits in previous 12 months J.  [place] such as hearing and vision if needed, cognitive and depression L. Referrals and appointments - none  In addition, I have reviewed and discussed with patient certain preventive protocols, quality metrics, and best practice recommendations. A written personalized care plan for preventive services as well as general preventive health recommendations were provided to patient.  See attached scanned questionnaire for additional information.   Signed,  Fabio Neighbors, LPN Nurse Health Advisor   Nurse Recommendations: Pt needs a diabetic foot exam today.

## 2018-12-09 ENCOUNTER — Other Ambulatory Visit: Payer: Self-pay | Admitting: Family Medicine

## 2018-12-09 DIAGNOSIS — Z1231 Encounter for screening mammogram for malignant neoplasm of breast: Secondary | ICD-10-CM

## 2018-12-09 LAB — CBC WITH DIFFERENTIAL/PLATELET
Basophils Absolute: 0 10*3/uL (ref 0.0–0.2)
Basos: 0 %
EOS (ABSOLUTE): 0.3 10*3/uL (ref 0.0–0.4)
Eos: 4 %
Hematocrit: 28 % — ABNORMAL LOW (ref 34.0–46.6)
Hemoglobin: 9 g/dL — ABNORMAL LOW (ref 11.1–15.9)
Immature Grans (Abs): 0 10*3/uL (ref 0.0–0.1)
Immature Granulocytes: 0 %
Lymphocytes Absolute: 1.2 10*3/uL (ref 0.7–3.1)
Lymphs: 16 %
MCH: 28.8 pg (ref 26.6–33.0)
MCHC: 32.1 g/dL (ref 31.5–35.7)
MCV: 90 fL (ref 79–97)
Monocytes Absolute: 0.6 10*3/uL (ref 0.1–0.9)
Monocytes: 8 %
Neutrophils Absolute: 5.3 10*3/uL (ref 1.4–7.0)
Neutrophils: 72 %
Platelets: 202 10*3/uL (ref 150–450)
RBC: 3.12 x10E6/uL — ABNORMAL LOW (ref 3.77–5.28)
RDW: 12 % (ref 11.7–15.4)
WBC: 7.5 10*3/uL (ref 3.4–10.8)

## 2018-12-09 LAB — COMPREHENSIVE METABOLIC PANEL
ALT: 7 IU/L (ref 0–32)
AST: 18 IU/L (ref 0–40)
Albumin/Globulin Ratio: 1.5 (ref 1.2–2.2)
Albumin: 3.7 g/dL (ref 3.7–4.7)
Alkaline Phosphatase: 77 IU/L (ref 39–117)
BUN/Creatinine Ratio: 22 (ref 12–28)
BUN: 10 mg/dL (ref 8–27)
Bilirubin Total: <0.2 mg/dL (ref 0.0–1.2)
CO2: 31 mmol/L — ABNORMAL HIGH (ref 20–29)
Calcium: 8.9 mg/dL (ref 8.7–10.3)
Chloride: 101 mmol/L (ref 96–106)
Creatinine, Ser: 0.46 mg/dL — ABNORMAL LOW (ref 0.57–1.00)
GFR calc Af Amer: 111 mL/min/{1.73_m2} (ref >59)
GFR calc non Af Amer: 96 mL/min/{1.73_m2} (ref >59)
Globulin, Total: 2.5 g/dL (ref 1.5–4.5)
Glucose: 89 mg/dL (ref 65–99)
Potassium: 4.3 mmol/L (ref 3.5–5.2)
Sodium: 147 mmol/L — ABNORMAL HIGH (ref 134–144)
Total Protein: 6.2 g/dL (ref 6.0–8.5)

## 2018-12-09 LAB — LIPID PANEL
Chol/HDL Ratio: 2.9 ratio (ref 0.0–4.4)
Cholesterol, Total: 173 mg/dL (ref 100–199)
HDL: 59 mg/dL (ref >39)
LDL Calculated: 92 mg/dL (ref 0–99)
Triglycerides: 110 mg/dL (ref 0–149)
VLDL Cholesterol Cal: 22 mg/dL (ref 5–40)

## 2018-12-09 LAB — TSH: TSH: 1.01 u[IU]/mL (ref 0.450–4.500)

## 2018-12-11 ENCOUNTER — Telehealth: Payer: Self-pay | Admitting: Pharmacist

## 2018-12-15 DIAGNOSIS — J449 Chronic obstructive pulmonary disease, unspecified: Secondary | ICD-10-CM | POA: Diagnosis not present

## 2018-12-17 ENCOUNTER — Telehealth: Payer: Self-pay

## 2018-12-17 NOTE — Telephone Encounter (Signed)
Faxed order RX for [employer] for O2 conserving device due to DME company discontinuing liquid O2. Beth

## 2018-12-22 ENCOUNTER — Ambulatory Visit (INDEPENDENT_AMBULATORY_CARE_PROVIDER_SITE_OTHER): Payer: Medicare Other | Admitting: Pharmacist

## 2018-12-22 DIAGNOSIS — J439 Emphysema, unspecified: Secondary | ICD-10-CM

## 2018-12-22 DIAGNOSIS — I10 Essential (primary) hypertension: Secondary | ICD-10-CM | POA: Diagnosis not present

## 2018-12-22 DIAGNOSIS — E119 Type 2 diabetes mellitus without complications: Secondary | ICD-10-CM | POA: Diagnosis not present

## 2018-12-22 NOTE — Chronic Care Management (AMB) (Signed)
  Chronic Care Management   Follow Up Note   12/22/2018 Name: Kristen Schroeder MRN: [id number] DOB: [birth year]  Referred by: Maple Hudson., MD Reason for referral : Chronic Care Management (Follow up - financial assistance)   Subjective: [name] is a 78 year old female patient of Dr. Julieanne Schroeder. [name] is engaged with the CCM team for assistance with her medication costs and her healthcare bills and expenses due to hospitalizations in the summer of 2019.   Successful outreach with [name] today to assess progression of CCM goals. HIPAA identifiers verified.  Assessment:   #Financial hardship: With [name] present on the phone, called [facility] Business and Billing Department (customer service line) to review [name]'s medical bills. Spoke with representative Kristen Schroeder. To qualify for [facility] Financial Aid or other assistance via [facility], amount owed must be greater than $5000. The application for assistance must furthermore be completed within 240 days of the first bill. Unfortunately, it has been greater than 240 days for two of Kristen Schroeder's outstanding medical bills, and the remaining bill is less than $5000. The two older bills have been sent to collections. [name] (applied to by her daughter).   #Medication assistance: CCM pharmacist collaborated with [employer] to ensure completion of Energy manager for [employer]. Mailed original copies of [name]'s [employer] application to [employer] at [employer] requires original copy of application and it must come from prescriber's office. [name] states that she still has not received any letter from [employer] Extra Help application (applied with CCM pharmacist 10/22/19).    Goals Addressed            This Visit's Progress   . "I can't afford my healthcare bills" (pt-stated)       Current Barriers:  . Limited social security  income . High cost medications   Nurse Case Manager Clinical Goal(s): Over the next 2 weeks, patient will apply for financial assistance with [place] medial Bills with the assistance of the CCM Team  Interventions:  Marland Kitchen Verified combined monthly income of patient and spouse . Discussed property reduction application . Contacted [place] financial department to seek a financial repayment plan   Patient Self Care Activities:  . Patient will gather proof of income documents provided by social security and present to CCM Team   Please see past updates related to this goal by clicking on the "Past Updates" button in the selected goal      . "I can't afford this Dulera" (pt-stated)        Clinical Goals: (updated) Over the next 14 days, patient will engage with [facility] Clinic Pharmacist for medication management and assistance  Interventions:  12/03/17: CCM pharmacist initiates an application to [employer] for [employer] patient assistance   12/22/18: CCM pharmacist contacted [facility] associates as they have not returned [employer] application   Please see past updates related to this goal by clicking on the "Past Updates" button in the selected goal          The patient has been provided with contact information for the chronic care management team and has been advised to call with any health related questions or concerns.   Follow up in 2 weeks with [facility] and [name], PharmD Clinical Pharmacist [facility] Practice/[facility] [phone]

## 2019-01-05 ENCOUNTER — Ambulatory Visit: Payer: Self-pay

## 2019-01-05 ENCOUNTER — Ambulatory Visit (INDEPENDENT_AMBULATORY_CARE_PROVIDER_SITE_OTHER): Payer: Medicare Other | Admitting: Pharmacist

## 2019-01-05 DIAGNOSIS — J439 Emphysema, unspecified: Secondary | ICD-10-CM

## 2019-01-05 DIAGNOSIS — I509 Heart failure, unspecified: Secondary | ICD-10-CM

## 2019-01-05 NOTE — Chronic Care Management (AMB) (Signed)
  Chronic Care Management   Follow Up Note   01/05/2019 Name: AAYLAH WALEGA MRN: [id number] DOB: 1941-09-03  Referred by: Maple Hudson., MD Reason for referral : Chronic Care Management   Subjective: Mrs. Daziah Schoenborn is a 78 year old female patient of Dr. Sullivan Lone engaged with CCM clinical pharmacist for medication assistance.   Follow up outreach today via telephone with Mrs. Bunton to assess progression of clinical pharmacy goals. HIPAA identifiers verified.  Assessment: Placed phone call to [facility] (Dr. Welton Flakes) to assess status of Dulera (made by [employer]) medication assistance application. CMA states that application has been submitted and patient should be notified of status within 3 weeks.   Notified Mrs. Azpeitia that Dr. Milta Deiters office is managing [facility] application and she can follow up with their office, as they will receive notification of enrollment first. Mrs. Colicchio verbalized understanding.   Goals Addressed            This Visit's Progress   . "I can't afford this Dulera" (pt-stated)        Clinical Goals: (updated) Over the next 21 days, patient will receive notification from [employer] about enrollment in [place] medication assistance program.   Interventions:  12/03/17: CCM pharmacist initiates an application to [employer] for [employer] patient assistance   12/22/18: CCM pharmacist contacted [facility] associates as they have not returned [employer] application   01/23/19: Dr. Milta Deiters office has submitted [place] Sleight application to [employer], follow up in 3 weeks   Please see past updates related to this goal by clicking on the "Past Updates" button in the selected goal          The CM team will reach out to the patient again over the next 21 days.    Karalee Height, PharmD Clinical Pharmacist [facility] Practice/[facility] [phone]

## 2019-01-05 NOTE — Chronic Care Management (AMB) (Signed)
  Chronic Care Management   Follow Up Note   01/05/2019 Name: Kristen Schroeder MRN: [id number] DOB: 06-Dec-1940  Referred by: Maple Hudson., MD Reason for referral : Chronic Care Management (request for oxygen device)   Subjective: "I really want to go to church but my oxygen only last an hour   Objective:  Assessment: Kristen Schroeder a 78 y.o.year old femalewho sees Maple Hudson., MDfor primary care. Dr. Leone Brand the CCM team to consult the patient for assistance with chronic disease management related to HTN, chronic Bronchitis, O2 dependency and CHF. Patient also needs financial assistance with her [facility] inhaler.Referral was placed 09/24/18 during patient's routine office visit.Kristen Schroeder is seen in the office by the CCM RN CM to discuss her healthcare goals on 10/15/18. Today, CCM Clinic Pharmacist followed up with Kristen Schroeder however Kristen Schroeder had concerns about her portable oxygen.  Goals Addressed            This Visit's Progress   . I need oxygen that will last longer than an hour       Kristen Schroeder utilizes [employer] for her home O2. Kristen Schroeder was receiving liquid oxygen for her portable needs however has been told Kristen Schroeder will no longer supply liquid oxygen. She has been provided a small D tank for travel however because she is on 4 liters continuous oxygen, this small tank only last 1 hour. She was assessed by an RT with [employer] on Friday who will contact pulmonologist for additional DME orders/request. If this is not accomplished RN CM will provide care coordination to facilitate appropriate oxygen equipment that will improve patients quality of life as she is currently unable to go to church.  Current Barriers:  Marland Kitchen Knowledge Deficits related to types of portable oxygen devices available that provides continueous flow  Nurse Case Manager Clinical Goal(s):  Marland Kitchen Over the next 7 days, patient will work with [employer] (community agency) to establish and acquire  appropriate portable oxygen devise that will allow patient to attend church or outings lasting > 1 hour  Interventions:  . Advised patient to contact CCM RN CM if she does not have a resolution to her oxygen barrier in 1 week  Patient Self Care Activities:  . Attends church or other social activities without running out of oxygen  Plan:  . Patient will contact CCM Team for additional assistance with oxygen if needed   Initial goal documentation       Telephone follow up appointment with CCM team member scheduled for: 1 week  Georgiana Spillane E. Suzie Portela, RN, BSN Nurse Care Coordinator [facility] Care Management [phone]

## 2019-01-05 NOTE — Patient Instructions (Signed)
Goals Addressed            This Visit's Progress   . "I can't afford this Dulera" (pt-stated)        Clinical Goals: (updated) Over the next 21 days, patient will receive notification from [employer] about enrollment in [place] medication assistance program.   Interventions:  12/03/17: CCM pharmacist initiates an application to [employer] for [employer] patient assistance   12/22/18: CCM pharmacist contacted [facility] associates as they have not returned [employer] application   01/23/19: Dr. Milta Deiters office has submitted Kristen Schroeder application to [employer], follow up in 3 weeks   Please see past updates related to this goal by clicking on the "Past Updates" button in the selected goal          The patient verbalized understanding of instructions provided today and declined a print copy of patient instruction materials.

## 2019-01-06 NOTE — Patient Instructions (Signed)
  Thank you allowing the Chronic Care Management Team to be a part of your care! It was a pleasure speaking with you today!  1. Please contact CCM RN CM if you do not hear from [employer] by the end of the week. I will assist you with options for a more appropriate portable oxygen delivery device.  CCM (Chronic Care Management) Team   Yvone Neu RN, BSN Nurse Care Coordinator  [phone]  Karalee Height PharmD  Clinical Pharmacist  [phone]   Goals Addressed            This Visit's Progress   . I need oxygen that will last longer than an hour       Current Barriers:  Marland Kitchen Knowledge Deficits related to types of portable oxygen devices available that provides continueous flow  Nurse Case Manager Clinical Goal(s):  Marland Kitchen Over the next 7 days, patient will work with [employer] (community agency) to establish and acquire appropriate portable oxygen devise that will allow patient to attend church or outings lasting > 1 hour  Interventions:  . Advised patient to contact CCM RN CM if she does not have a resolution to her oxygen barrier in 1 week  Patient Self Care Activities:  . Attends church or other social activities without running out of oxygen  Plan:  . Patient will contact CCM Team for additional assistance with oxygen if needed   Initial goal documentation        The patient verbalized understanding of instructions provided today and declined a print copy of patient instruction materials.   Telephone follow up appointment with CCM team member scheduled for: 1 week

## 2019-01-08 ENCOUNTER — Telehealth: Payer: Self-pay

## 2019-01-08 ENCOUNTER — Ambulatory Visit: Payer: Self-pay | Admitting: Pharmacist

## 2019-01-08 DIAGNOSIS — J439 Emphysema, unspecified: Secondary | ICD-10-CM

## 2019-01-08 DIAGNOSIS — I509 Heart failure, unspecified: Secondary | ICD-10-CM

## 2019-01-08 NOTE — Chronic Care Management (AMB) (Signed)
  Chronic Care Management   Note  01/08/2019 Name: Kristen Schroeder MRN: [id number] DOB: February 15, 1941  Incoming call from [name]. HIPAA identifiers verified.  [name] states that she had a Chief of Staff come to her home as part of her health benefit and told her she needed a rescue inhaler.   Together with CCM pharmacist and Dr. Welton Flakes, a medication assistance application for Elwin Sleight has been submitted to [employer]. [employer] also makes an albuterol inhaler that Dr. Welton Flakes can submit a prescription to once [name] is enrolled in the program. Additionally, [name] has a DuoNeb nebulizer (ipratropium-albuterol) that she uses as a Doctor, hospital. Counseled [name] that she can use her nebulizer DubNebs as a rescue if she is at home and explained the [employer] process.   Goals Addressed            This Visit's Progress   . "I can't afford this Dulera" (pt-stated)        Clinical Goals: (updated) Over the next 21 days, patient will receive notification from [employer] about enrollment in [place] medication assistance program.   Interventions:  12/03/17: CCM pharmacist initiates an application to [employer] for [employer] patient assistance   12/22/18: CCM pharmacist contacted [facility] associates as they have not returned [employer] application   01/05/19: Dr. Milta Deiters office has submitted Elwin Sleight application to [employer], follow up in 3 weeks    01/08/19: Once [employer] application is accepted, Dr. Welton Flakes can submitted albuterol rescue inhaler to [employer]  Please see past updates related to this goal by clicking on the "Past Updates" button in the selected goal         Follow up plan: The CM team will reach out to the patient again over the next 21 days.   Karalee Height, PharmD Clinical Pharmacist [facility] [phone]

## 2019-01-09 ENCOUNTER — Telehealth: Payer: Self-pay

## 2019-01-09 ENCOUNTER — Other Ambulatory Visit: Payer: Self-pay

## 2019-01-09 MED ORDER — ALBUTEROL SULFATE HFA 108 (90 BASE) MCG/ACT IN AERS: INHALATION_SPRAY | RESPIRATORY_TRACT | 3 refills | Status: DC

## 2019-01-09 NOTE — Telephone Encounter (Signed)
Pt advised we add albuterol inhaler and send to phar

## 2019-01-09 NOTE — Telephone Encounter (Signed)
Spoke with [employer] and with patient and explained to her that a respiratory tech will be calling her to explain how the oxygen works now that they stopped carrying liquid and they need to explain in detail to help pt understand refilling the tanks and call if she has any other issues. Beth

## 2019-01-09 NOTE — Telephone Encounter (Signed)
You can go ahead and add the rescue inhaler.

## 2019-01-12 ENCOUNTER — Ambulatory Visit: Payer: Self-pay

## 2019-01-12 DIAGNOSIS — I509 Heart failure, unspecified: Secondary | ICD-10-CM

## 2019-01-12 DIAGNOSIS — J42 Unspecified chronic bronchitis: Secondary | ICD-10-CM

## 2019-01-12 NOTE — Patient Instructions (Signed)
  Thank you allowing the Chronic Care Management Team to be a part of your care! It was a pleasure speaking with you today!  1. Please arrange to be home on Thursday after 1:30 to receive delivery of larger oxygen tanks and carrier. 2. CCM RN CM will follow up with you on Friday to see if you have any questions or concerns about their use.  CCM (Chronic Care Management) Team   Yvone Neu RN, BSN Nurse Care Coordinator  [phone]  Karalee Height PharmD  Clinical Pharmacist  [phone]   Verna Czech, LCSW Clinical Social Worker [phone]  Goals Addressed            This Visit's Progress   . I need oxygen that will last longer than an hour       Current Barriers:  Marland Kitchen Knowledge Deficits related to types of portable oxygen devices available that provides continueous flow  Nurse Case Manager Clinical Goal(s):  Marland Kitchen Over the next 7 days, patient will report delivery of E tanks and understand how to use and when/where to exchange tanks when empty  Interventions:   Collaborated with [facility] General Manager to explore options for patients portable high liter flow and to have E tanks with cart delivered to patient on Thursday after 1:30 pm  Discussed portable option for patient and addressed concerns  Patient Self Care Activities:  . Attends church or other social activities without running out of oxygen  Plan:  . CCM RN CM will follow up with patient on Friday   Please see past updates related to this goal by clicking on the "Past Updates" button in the selected goal         The patient verbalized understanding of instructions provided today and declined a print copy of patient instruction materials.   Telephone follow up appointment with CCM team member scheduled for: Friday

## 2019-01-12 NOTE — Chronic Care Management (AMB) (Signed)
  Chronic Care Management   Follow Up Note   01/12/2019 Name: Kristen Schroeder MRN: [id number] DOB: 12/12/1940  Referred by: Maple Hudson., MD Reason for referral : Chronic Care Management (oxygen resource)   Subjective: "Sombody call me about my oxygen on Friday but I think I deleted the number from the answering machine" "I don't know what I will do if I can't get back to church"   Objective:  Assessment: Kristen Schroeder a 78 y.o.year old femalewho sees Maple Hudson., MDfor primary care. Dr. Leone Brand the CCM team to consult the patient for assistance with chronic disease management related to HTN, chronic Bronchitis, O2 dependency and CHF. Patient also needs financial assistance with her [facility] inhaler.Referral was placed 09/24/18 during patient's routine office visit.Ms Stasiak was seen in the office by the CCM RN CM to discuss her healthcare goalson 10/15/18.Today, CCM Clinic RN CM followed up with Ms. Whetstine to continue conversation and provide assistance with concerns about her portable oxygen.  Goals Addressed            This Visit's Progress   . I need oxygen that will last longer than an hour       Current Barriers:  Marland Kitchen Knowledge Deficits related to types of portable oxygen devices available that provides continueous flow  Nurse Case Manager Clinical Goal(s):  Marland Kitchen Over the next 7 days, patient will report delivery of E tanks and understand how to use and when/where to exchange tanks when empty  Interventions:   Collaborated with [facility] General Manager to explore options for patients portable high liter flow and to have E tanks with cart delivered to patient on Thursday after 1:30 pm  Discussed portable option for patient and addressed concerns  Patient Self Care Activities:  . Attends church or other social activities without running out of oxygen  Plan:  . CCM RN CM will follow up with patient on Friday   Please see past updates  related to this goal by clicking on the "Past Updates" button in the selected goal          Telephone follow up appointment with CCM team member scheduled for: Friday   Briceson Broadwater E. Suzie Portela, RN, BSN Nurse Care Coordinator [facility] Practice/[facility] Care Management [phone]

## 2019-01-13 DIAGNOSIS — J449 Chronic obstructive pulmonary disease, unspecified: Secondary | ICD-10-CM | POA: Diagnosis not present

## 2019-01-14 ENCOUNTER — Other Ambulatory Visit: Payer: Self-pay

## 2019-01-14 ENCOUNTER — Ambulatory Visit
Admission: RE | Admit: 2019-01-14 | Discharge: 2019-01-14 | Disposition: A | Discharge status: Home / Self Care | Payer: Medicare Other | Source: Ambulatory Visit | Attending: Family Medicine | Admitting: Family Medicine

## 2019-01-14 DIAGNOSIS — Z13820 Encounter for screening for osteoporosis: Secondary | ICD-10-CM

## 2019-01-14 DIAGNOSIS — Z1231 Encounter for screening mammogram for malignant neoplasm of breast: Secondary | ICD-10-CM | POA: Insufficient documentation

## 2019-01-14 DIAGNOSIS — Z780 Asymptomatic menopausal state: Secondary | ICD-10-CM | POA: Diagnosis not present

## 2019-01-15 ENCOUNTER — Telehealth: Payer: Self-pay

## 2019-01-15 NOTE — Telephone Encounter (Signed)
-----   Message from Maple Hudson., MD sent at 01/14/2019 12:26 PM EDT ----- Normal BMD.  This does not need to be repeated.

## 2019-01-15 NOTE — Telephone Encounter (Signed)
Pt advised.   Thanks,   -Jaben Benegas  

## 2019-01-16 ENCOUNTER — Ambulatory Visit: Payer: Self-pay

## 2019-01-16 ENCOUNTER — Other Ambulatory Visit: Payer: Self-pay

## 2019-01-16 DIAGNOSIS — I509 Heart failure, unspecified: Secondary | ICD-10-CM

## 2019-01-16 DIAGNOSIS — J42 Unspecified chronic bronchitis: Secondary | ICD-10-CM

## 2019-01-16 NOTE — Chronic Care Management (AMB) (Signed)
  Chronic Care Management   Follow Up Note   01/18/2019 Name: LOUCILE NIEBUR MRN: [id number] DOB: 1941-05-31  Referred by: Maple Hudson., MD Reason for referral : Chronic Care Management (follow up on oxygen)  Alonni L Haithis a 78 y.o.year old femalewho sees Maple Hudson., MDfor primary care. Dr. Leone Brand the CCM team to consult the patient for assistance with chronic disease management related to HTN, chronic Bronchitis, O2 dependency and CHF. Patient also needs financial assistance with her [facility] inhaler.Referral was placed 09/24/18 during patient's routine office visit.Ms Cutchin was seen in the office by the CCM RN CM to discuss her healthcare goalson 10/15/18.Today, CCMClinic RN CM followed up with Ms. Boling to confirm her larger oxygen tanks with carrier has been delivered.    Review of patient status, including review of consultants reports, relevant laboratory and other test results, and collaboration with appropriate care team members and the patient's provider was performed as part of comprehensive patient evaluation and provision of chronic care management services.    Goals Addressed            This Visit's Progress   . COMPLETED: I need oxygen that will last longer than an hour       Ms. Briscoe was delivered multiple E tanks of oxygen yesterday along with a pull carrier. This larger tank will allow Ms. Bolz to attend church and other social outings without having to take multiple smaller carriers with her. This pull behind will also decrease the stress on her shoulders as her current tanks weigh between 10 and 20 lbs.   Current Barriers:  Marland Kitchen Knowledge Deficits related to types of portable oxygen devices available that provides continueous flow  Nurse Case Manager Clinical Goal(s):  Marland Kitchen Over the next 7 days, patient will report delivery of E tanks and understand how to use and when/where to exchange tanks when empty  Interventions:   Collaborated  with [facility] General Manager to explore options for patients portable high liter flow and to have E tanks with cart delivered to patient on Thursday after 1:30 pm  Discussed portable option for patient and addressed concerns  Patient Self Care Activities:  . Attends church or other social activities without running out of oxygen  Plan:  . CCM RN CM will follow up with patient on Friday   Please see past updates related to this goal by clicking on the "Past Updates" button in the selected goal          Telephone follow up appointment with CCM team member scheduled for: 2 weeks   Kato Wieczorek E. Suzie Portela, RN, BSN Nurse Care Coordinator [facility] Care Management [phone]

## 2019-01-19 NOTE — Patient Instructions (Signed)
Thank you allowing the Chronic Care Management Team to be a part of your care! It was a pleasure speaking with you today!  1. Continue to use your oxygen as prescribed. 2. Take all your medications. 3. Please call your provider if you have any scheduled routine follow ups to make sure they still want you to come in or reschedule. 4. I have provided you with handwashing information. This is the best way to avoid getting sick and staying healthy   Handwashing is one of the best ways to protect yourself and your family from getting sick. Wash Your Hands Often to Stay Healthy  When: You can help yourself and your loved ones stay healthy by washing your hands often, especially during these key times when you are likely to get and spread germs: . Before, during, and after preparing food  . Before eating food  . Before and after caring for someone at home who is sick with vomiting or diarrhea  . Before and after treating a cut or wound  . After using the toilet  . After being in public spaces . After changing diapers or cleaning up a child who has used the toilet  . After blowing your nose, coughing, or sneezing  . After touching an animal, animal feed, or animal waste  . After handling pet food or pet treats  . After touching garbage . After touching public doors, telephones, shopping carts, or other surfaces .  Five Steps to Wash Your Hands the Right Way 1. Wet your hands with clean, running water (warm or cold), turn off the tap, and apply soap. 2. Lather your hands by rubbing them together with the soap. Lather the backs of your hands, between your fingers, and under your nails. 3. Scrub your hands for at least 20 seconds. Need a timer? Hum the "Happy Birthday" song from beginning to end twice. 4. Rinse your hands well under clean, running water. 5. Dry your hands using a clean towel or air dry them.  Source: icrowncustoms.com   CCM (Chronic  Care Management) Team   Yvone Neu RN, BSN Nurse Care Coordinator  [phone]  Karalee Height PharmD  Clinical Pharmacist  [phone]   Verna Czech, LCSW Clinical Social Worker [phone]  Goals Addressed            This Visit's Progress   . COMPLETED: I need oxygen that will last longer than an hour       Current Barriers:  Marland Kitchen Knowledge Deficits related to types of portable oxygen devices available that provides continueous flow  Nurse Case Manager Clinical Goal(s):  Marland Kitchen Over the next 7 days, patient will report delivery of E tanks and understand how to use and when/where to exchange tanks when empty  Interventions:   Collaborated with [facility] General Manager to explore options for patients portable high liter flow and to have E tanks with cart delivered to patient on Thursday after 1:30 pm  Discussed portable option for patient and addressed concerns  Patient Self Care Activities:  . Attends church or other social activities without running out of oxygen  Plan:  . CCM RN CM will follow up with patient on Friday   Please see past updates related to this goal by clicking on the "Past Updates" button in the selected goal         The patient verbalized understanding of instructions provided today and declined a print copy of patient instruction materials.   Telephone  follow up appointment with CCM team member scheduled for: 2 weeks

## 2019-01-20 ENCOUNTER — Other Ambulatory Visit: Payer: Self-pay | Admitting: Internal Medicine

## 2019-01-20 ENCOUNTER — Telehealth: Payer: Self-pay | Admitting: Family Medicine

## 2019-01-20 NOTE — Telephone Encounter (Signed)
Feels like she's trying to take a cold (sneezing).   Has been taking the Zpack  Needing to if there is something else she can do or if Dr Sullivan Lone can call her in something?  Please advise.  Thanks, [employer]

## 2019-01-20 NOTE — Telephone Encounter (Signed)
Please review

## 2019-01-21 NOTE — Telephone Encounter (Signed)
Sneezing alone is probably just allergies.  Would not recommend an antibiotic for that.  If there is something else I am happy to call in a Z-Pak.

## 2019-01-22 NOTE — Telephone Encounter (Signed)
Spoke to pt and she is still having a cough and has taken OTC meds and seemed to help some.  She is taking the z-pak and will continue with it til finished.  Advised to take OTC meds and call back if things get worse.  dbs

## 2019-01-29 ENCOUNTER — Ambulatory Visit: Payer: Self-pay | Admitting: Internal Medicine

## 2019-01-31 DIAGNOSIS — J449 Chronic obstructive pulmonary disease, unspecified: Secondary | ICD-10-CM | POA: Diagnosis not present

## 2019-02-02 ENCOUNTER — Other Ambulatory Visit: Payer: Self-pay

## 2019-02-02 ENCOUNTER — Ambulatory Visit: Payer: Self-pay | Admitting: Pharmacist

## 2019-02-02 ENCOUNTER — Telehealth: Payer: Self-pay

## 2019-02-02 DIAGNOSIS — J439 Emphysema, unspecified: Secondary | ICD-10-CM

## 2019-02-02 DIAGNOSIS — J42 Unspecified chronic bronchitis: Secondary | ICD-10-CM

## 2019-02-02 NOTE — Chronic Care Management (AMB) (Signed)
  Chronic Care Management   Follow Up Note   02/02/2019 Name: Kristen Schroeder MRN: [id number] DOB: Apr 25, 1941  Referred by: Maple Hudson., MD Reason for referral : Chronic Care Management (Medication Assistance [facility]))   Kristen Schroeder is a 78 y.o. year old female who is a primary care patient of Maple Hudson., MD. Patient is engaged with CCM clinical pharmacist for medication assistance (Dulera, Proventil). HIPAA identifiers verified.  Assessment:   #Medication Assistance: Mrs. Howery received a letter from [employer] regarding her Medicare Extra Help application completed with CCM pharmacist 10/20/18. Mrs. Magnin qualified for Level 4 of LIS. She has not picked up her prescriptions since receiving the letter so she can't verify the lowered costs of her prescriptions. She has not received any communication from [employer] or Dr. Welton Flakes regarding the [facility] medication assistance application submitted.   #Medication adherence: Mrs. Boedeker has all of her medications right now. Provided extensive counseling about her risks and ways to minimize infection in light of COVID19 pandemic, including proper hand hygeine. Her son and daughter are assisting her with errands right now.   Goals Addressed            This Visit's Progress   . COMPLETED: "I can't afford my healthcare bills" (pt-stated)       Current Barriers:  . Limited social security income . High cost medications   Nurse Case Manager Clinical Goal(s): Over the next 2 weeks, patient will apply for financial assistance with [place] medial Bills with the assistance of the CCM Team  Interventions:  Marland Kitchen Verified combined monthly income of patient and spouse . Discussed property reduction application . Contacted [place] financial department to seek a financial repayment plan Completed: Unfortunately, Mrs. Brahmbhatt medical bills to not meet [facility]'s criteria for financial repayment plan  Patient Self  Care Activities:  . Patient will gather proof of income documents provided by social security and present to CCM Team   Please see past updates related to this goal by clicking on the "Past Updates" button in the selected goal      . "I can't afford this Dulera" (pt-stated)       Current Barriers:  . Financial Barriers  Pharmacist Clinical Goal(s):  Marland Kitchen Over the next 30 days, patient will work with CCM pharmacist and Dr. Welton Flakes to address needs related to medication assistance for [facility] and Proventil  Interventions: . Comprehensive medication review performed. . Collaboration with provider re: medication management (Dr. Welton Flakes of [facility] for medication assistance for [facility] provided by [employer])  Patient Self Care Activities:  . Self administers medications as prescribed . Calls pharmacy for medication refills   Please see past updates related to this goal by clicking on the "Past Updates" button in the selected goal          Telephone follow up appointment with CCM team member scheduled for: 30 days with PharmD    Karalee Height, PharmD Clinical Pharmacist [facility] [phone]

## 2019-02-02 NOTE — Patient Instructions (Signed)
Goals Addressed            This Visit's Progress   . COMPLETED: "I can't afford my healthcare bills" (pt-stated)       Current Barriers:  . Limited social security income . High cost medications   Nurse Case Manager Clinical Goal(s): Over the next 2 weeks, patient will apply for financial assistance with [place] medial Bills with the assistance of the CCM Team  Interventions:  Marland Kitchen Verified combined monthly income of patient and spouse . Discussed property reduction application . Contacted [place] financial department to seek a financial repayment plan Completed: Unfortunately, Mrs. Mormann medical bills to not meet [facility]'s criteria for financial repayment plan  Patient Self Care Activities:  . Patient will gather proof of income documents provided by social security and present to CCM Team   Please see past updates related to this goal by clicking on the "Past Updates" button in the selected goal      . "I can't afford this Dulera" (pt-stated)       Current Barriers:  . Financial Barriers  Pharmacist Clinical Goal(s):  Marland Kitchen Over the next 30 days, patient will work with CCM pharmacist and Dr. Welton Flakes to address needs related to medication assistance for [facility] and Proventil  Interventions: . Comprehensive medication review performed. . Collaboration with provider re: medication management (Dr. Welton Flakes of [facility] for medication assistance for [facility] provided by [employer])  Patient Self Care Activities:  . Self administers medications as prescribed . Calls pharmacy for medication refills   Please see past updates related to this goal by clicking on the "Past Updates" button in the selected goal         The patient verbalized understanding of instructions provided today and declined a print copy of patient instruction materials.

## 2019-02-11 ENCOUNTER — Ambulatory Visit (INDEPENDENT_AMBULATORY_CARE_PROVIDER_SITE_OTHER): Payer: Medicare Other

## 2019-02-11 ENCOUNTER — Other Ambulatory Visit: Payer: Self-pay

## 2019-02-11 DIAGNOSIS — E119 Type 2 diabetes mellitus without complications: Secondary | ICD-10-CM

## 2019-02-11 DIAGNOSIS — I10 Essential (primary) hypertension: Secondary | ICD-10-CM | POA: Diagnosis not present

## 2019-02-11 DIAGNOSIS — I509 Heart failure, unspecified: Secondary | ICD-10-CM | POA: Diagnosis not present

## 2019-02-11 DIAGNOSIS — J42 Unspecified chronic bronchitis: Secondary | ICD-10-CM | POA: Diagnosis not present

## 2019-02-11 NOTE — Chronic Care Management (AMB) (Signed)
  Chronic Care Management   Follow Up Note   02/11/2019 Name: Kristen Schroeder MRN: [id number] DOB: [birth year]  Referred by: Maple Hudson., MD Reason for referral : Chronic Care Management (follow up COPD)   Subjective: "I am doing real good" "I just don't want to go to that hospital"   Objective:  Lab Results  Component Value Date   HGBA1C 6.4 (A) 09/24/2018   BP Readings from Last 3 Encounters:  12/08/18 (!) 128/54  12/08/18 (!) 128/54  09/29/18 (!) 162/88    Assessment: Kristen Schroeder a 77 y.o.year old femalewho sees Maple Hudson., MDfor primary care. Dr. Leone Brand the CCM team to consult the patient for assistance with chronic disease management related to HTN, chronic Bronchitis, O2 dependency and CHF. Patient also needs financial assistance with her [facility] inhaler.Referral was placed 09/24/18 during patient's routine office visit.Kristen Schroeder in the office by the CCM RN CM to discuss her healthcare goalson 10/15/18 and continues to be followed by CCM Team. Today CCM RN CM followed up with Kristen Schroeder to assesses for needs and additional health goals.  Goals Addressed            This Visit's Progress   . I want to stay well and out of the hospital (pt-stated)       Kristen Schroeder is doing very well. She is checking her BP and CBG daily. Reports today's BP as 126/71 and CBG as 90. She is taking all medications as prescribed including wearing her oxygen as directed. She is following COVID-19 infection prevention strategies. She understands her COPD action plan and knows when to call her provider. She is aware of her CHF action plan and understands to call her provider with >3 lbs overnight or 5 lbs in one week.  Current Barriers:  . Corporate treasurer.  Kristen Schroeder barriers  Nurse Case Manager Clinical Goal(s):   Over the next 60 days, patient will demonstrate improved health management independence as evidenced bytaking medicatins as presribed,  follow COPD action plan and notify MD when in the yellow zone, attend all medical provider appointments, and call provider with any questions or concerns   Over the next 60 days, patient will not experience hospitalization or ED visit for chronic conditions  Interventions:  . Discussed plans with patient for ongoing care management follow up and provided patient with direct contact information for care management team  . Reviewed daily BP and CBG readings . Reviewed COPD action plan  Patient Self Care Activities:  . Self administers medications as prescribed . Attends all scheduled provider appointments . Calls pharmacy for medication refills . Attends church or other social activities . Performs ADL's independently . Calls provider office for new concerns or questions  Initial goal documentation         Telephone follow up appointment with CCM team member scheduled for:60 days   Kaisley Stiverson E. Suzie Portela, RN, BSN Nurse Care Coordinator [facility] Care Management [phone]

## 2019-02-11 NOTE — Patient Instructions (Signed)
  Thank you allowing the Chronic Care Management Team to be a part of your care! It was a pleasure speaking with you today!  1. Take your medications as prescribed 2. Attend all provider appointments 3. Follow your CHF and COPD action plan 4. Continue to check your BP and CBG as prescribed and weight daily (record) 5. Continue to follow CDC guidelines for COVID-19 infection prevention 6. Try to exercise daily, you can do seated exercise that will decrease your shortness of breath during exertion.  CCM (Chronic Care Management) Team   Yvone Neu RN, BSN Nurse Care Coordinator  [phone]  Karalee Height PharmD  Clinical Pharmacist  [phone]   Verna Czech, LCSW Clinical Social Worker [phone]  Goals Addressed            This Visit's Progress   . I want to stay well and out of the hospital (pt-stated)       Current Barriers:  . Financial Constraints.  Vikki Ports barriers  Nurse Case Manager Clinical Goal(s):   Over the next 60 days, patient will demonstrate improved health management independence as evidenced bytaking medicatins as presribed, follow COPD action plan and notify MD when in the yellow zone, attend all medical provider appointments, and call provider with any questions or concerns   Over the next 60 days, patient will not experience hospitalization or ED visit for chronic conditions  Interventions:  . Discussed plans with patient for ongoing care management follow up and provided patient with direct contact information for care management team  . Reviewed daily BP and CBG readings . Reviewed COPD action plan  Patient Self Care Activities:  . Self administers medications as prescribed . Attends all scheduled provider appointments . Calls pharmacy for medication refills . Attends church or other social activities . Performs ADL's independently . Calls provider office for new concerns or questions  Initial goal documentation         The patient verbalized understanding of instructions provided today and declined a print copy of patient instruction materials.   Telephone follow up appointment with CCM team member scheduled for:60 days

## 2019-02-13 DIAGNOSIS — J449 Chronic obstructive pulmonary disease, unspecified: Secondary | ICD-10-CM | POA: Diagnosis not present

## 2019-02-23 ENCOUNTER — Ambulatory Visit: Payer: Self-pay | Admitting: Internal Medicine

## 2019-02-25 ENCOUNTER — Other Ambulatory Visit: Payer: Self-pay | Admitting: Family Medicine

## 2019-02-25 NOTE — Telephone Encounter (Signed)
We also received a fax that Lisinopril was on back order. Fax on your desk to review. Thanks!

## 2019-02-25 NOTE — Telephone Encounter (Signed)
Ok to rf for 1 year. 

## 2019-02-25 NOTE — Telephone Encounter (Signed)
[facility] faxed refill request for the following medications:  lisinopril (PRINIVIL,ZESTRIL) 20 MG tablet  Please advise.  Thanks, [employer]

## 2019-02-26 MED ORDER — ENALAPRIL MALEATE 20 MG PO TABS: 20.0000 mg | ORAL_TABLET | Freq: Every day | ORAL | 3 refills | Status: DC

## 2019-02-26 NOTE — Telephone Encounter (Signed)
Does the medication need to be changed to something else?

## 2019-02-26 NOTE — Telephone Encounter (Signed)
New rx sent to pharmacy.  Pt advised of new rx

## 2019-02-26 NOTE — Telephone Encounter (Signed)
Try enalapril 20 mg daily.

## 2019-03-02 ENCOUNTER — Ambulatory Visit: Payer: Self-pay | Admitting: Pharmacist

## 2019-03-02 ENCOUNTER — Telehealth: Payer: Self-pay | Admitting: Family Medicine

## 2019-03-02 ENCOUNTER — Other Ambulatory Visit: Payer: Self-pay

## 2019-03-02 DIAGNOSIS — J439 Emphysema, unspecified: Secondary | ICD-10-CM

## 2019-03-02 DIAGNOSIS — J42 Unspecified chronic bronchitis: Secondary | ICD-10-CM

## 2019-03-02 NOTE — Telephone Encounter (Signed)
Please review. Thanks!  

## 2019-03-02 NOTE — Telephone Encounter (Signed)
Pt needing a call back to know what Dr. Sullivan Lone would suggest for her stuffiness in her sinuses this morning.  Please advise.  Thanks, [employer]

## 2019-03-03 DIAGNOSIS — J449 Chronic obstructive pulmonary disease, unspecified: Secondary | ICD-10-CM | POA: Diagnosis not present

## 2019-03-03 NOTE — Telephone Encounter (Signed)
OTC meds and sinus rinses.

## 2019-03-03 NOTE — Telephone Encounter (Signed)
Patient was advised.  

## 2019-03-04 NOTE — Chronic Care Management (AMB) (Signed)
  Chronic Care Management   Follow Up Note   03/04/2019 Name: Kristen Schroeder MRN: [id number] DOB: [birth year]  Referred by: Maple Hudson., MD Reason for referral : Chronic Care Management (Pharmacy follow up)   Kristen Schroeder is a 78 y.o. year old female who is a primary care patient of Maple Hudson., MD. The CCM team was consulted for assistance with chronic disease management and care coordination needs.    Review of patient status, including review of consultants reports, relevant laboratory and other test results, and collaboration with appropriate care team members and the patient's provider was performed as part of comprehensive patient evaluation and provision of chronic care management services.    Goals Addressed            This Visit's Progress   . "I can't afford this Dulera" (pt-stated)       Current Barriers:  . Financial Barriers  Pharmacist Clinical Goal(s):  Marland Kitchen Over the next 30 days, patient will work with CCM pharmacist and Dr. Welton Flakes to address needs related to medication assistance for [facility] and Proventil  Interventions: . Comprehensive medication review performed. . Collaboration with provider re: medication management (Dr. Welton Flakes of [facility] for medication assistance for [name] provided by [employer]) . Reviewed options for medication assistance for [facility] and Proventil  Patient Self Care Activities:  . Self administers medications as prescribed . Calls pharmacy for medication refills   Please see past updates related to this goal by clicking on the "Past Updates" button in the selected goal          Telephone follow up appointment with CCM team member scheduled for: 7 days with PharmD regarding [employer] assistance attestation form.   Karalee Height, PharmD Clinical Pharmacist [facility] [phone]

## 2019-03-09 ENCOUNTER — Ambulatory Visit (INDEPENDENT_AMBULATORY_CARE_PROVIDER_SITE_OTHER): Payer: Medicare Other | Admitting: Family Medicine

## 2019-03-09 ENCOUNTER — Encounter: Payer: Self-pay | Admitting: Family Medicine

## 2019-03-09 ENCOUNTER — Other Ambulatory Visit: Payer: Self-pay

## 2019-03-09 VITALS — BP 126/84 | HR 90 | Temp 98.3°F | Resp 18 | Wt 214.8 lb

## 2019-03-09 DIAGNOSIS — G4733 Obstructive sleep apnea (adult) (pediatric): Secondary | ICD-10-CM

## 2019-03-09 DIAGNOSIS — Z6833 Body mass index (BMI) 33.0-33.9, adult: Secondary | ICD-10-CM

## 2019-03-09 DIAGNOSIS — I10 Essential (primary) hypertension: Secondary | ICD-10-CM | POA: Diagnosis not present

## 2019-03-09 DIAGNOSIS — E66811 Obesity, class 1: Secondary | ICD-10-CM

## 2019-03-09 DIAGNOSIS — E119 Type 2 diabetes mellitus without complications: Secondary | ICD-10-CM | POA: Diagnosis not present

## 2019-03-09 DIAGNOSIS — E7849 Other hyperlipidemia: Secondary | ICD-10-CM

## 2019-03-09 DIAGNOSIS — E6609 Other obesity due to excess calories: Secondary | ICD-10-CM

## 2019-03-09 DIAGNOSIS — I509 Heart failure, unspecified: Secondary | ICD-10-CM

## 2019-03-09 LAB — POCT GLYCOSYLATED HEMOGLOBIN (HGB A1C): Hemoglobin A1C: 5.3 % (ref 4.0–5.6)

## 2019-03-09 NOTE — Progress Notes (Signed)
Diabetes Mellitus Type II, Follow-up:   Lab Results  Component Value Date   HGBA1C 5.3 03/09/2019   HGBA1C 6.4 (A) 09/24/2018   HGBA1C 5.3 03/04/2018    Last seen for diabetes 6 months ago.  Management since then includes . She reports is feeling good compliance with treatment. She is not having side effects.  Current symptoms include none and have been improving. Home blood sugar records: trend: decreasing steadily  Episodes of hypoglycemia? no   Current Insulin Regimen: none Most Recent Eye Exam:  Weight trend: stable Prior visit with dietician: Yes    Established Patient Office Visit  Subjective:  Patient ID: Kristen Schroeder, female    DOB: Jan 17, 1941  Age: 78 y.o. MRN: [id number]  CC: No chief complaint on file.   HPI Kristen Schroeder presents for follow-up of diabetes hypertension COPD and obesity.  Past Medical History:  Diagnosis Date  . Arthritis   . COPD (chronic obstructive pulmonary disease) ([place])   . Diabetes mellitus without complication ([place])   . Dysrhythmia   . Heart murmur   . History of orthopnea   . Hypertension   . Hypothyroidism   . Neuropathy   . Oxygen dependent    3L  CONTINUOUS  . Pain CHRONIC BACK PAIN  . Shortness of breath dyspnea   . Wheezing     Past Surgical History:  Procedure Laterality Date  . ABDOMINAL HYSTERECTOMY    . BOWEL RESECTION N/A 04/14/2018   Procedure: SMALL BOWEL RESECTION;  Surgeon: Jules Husbands, MD;  Location: [facility] ORS;  Service: General;  Laterality: N/A;  . BREAST BIOPSY Right    benign  . CATARACT EXTRACTION W/PHACO Left 07/03/2016   Procedure: CATARACT EXTRACTION PHACO AND INTRAOCULAR LENS PLACEMENT (IOC);  Surgeon: Birder Robson, MD;  Location: [facility] ORS;  Service: Ophthalmology;  Laterality: Left;  Lot: [id number] H [place]: 00:40.1 AP%: 17.4 CDE:6.94  . HERNIA REPAIR    . LAPAROTOMY N/A 09/04/2018   Procedure: EXPLORATORY LAPAROTOMY;  Surgeon: Olean Ree, MD;  Location: [facility] ORS;  Service: General;   Laterality: N/A;  . TUBAL LIGATION    . VENTRAL HERNIA REPAIR N/A 04/14/2018   Procedure: HERNIA REPAIR VENTRAL ADULT;  Surgeon: Jules Husbands, MD;  Location: [facility] ORS;  Service: General;  Laterality: N/A;    Family History  Problem Relation Age of Onset  . Heart disease Mother   . Drug abuse Other   . Hypertension Other     Social History   Socioeconomic History  . Marital status: Married    Spouse name: Not on file  . Number of children: 4  . Years of education: Not on file  . Highest education level: 8th grade  Occupational History  . Occupation: retired  Scientific laboratory technician  . Financial resource strain: Not very hard  . Food insecurity:    Worry: Never true    Inability: Never true  . Transportation needs:    Medical: No    Non-medical: No  Tobacco Use  . Smoking status: Former Smoker    Packs/day: 0.50    Years: 15.00    Pack years: 7.50  . Smokeless tobacco: Never Used  . Tobacco comment: 30-40 years ago  Substance and Sexual Activity  . Alcohol use: No    Alcohol/week: 0.0 standard drinks  . Drug use: No  . Sexual activity: Never  Lifestyle  . Physical activity:    Days per week: 0 days    Minutes per session: 0 min  .  Stress: Not at all  Relationships  . Social connections:    Talks on phone: Patient refused    Gets together: Patient refused    Attends religious service: Patient refused    Active member of club or organization: Patient refused    Attends meetings of clubs or organizations: Patient refused    Relationship status: Patient refused  . Intimate partner violence:    Fear of current or ex partner: No    Emotionally abused: No    Physically abused: No    Forced sexual activity: No  Other Topics Concern  . Not on file  Social History Narrative  . Not on file    Outpatient Medications Prior to Visit  Medication Sig Dispense Refill  . albuterol (PROVENTIL HFA;VENTOLIN HFA) 108 (90 Base) MCG/ACT inhaler Inhale 2 puff every 4-6 hrs as needed  for SOB 1 Inhaler 3  . arformoterol (BROVANA) 15 MCG/2ML NEBU Take 2 mLs (15 mcg total) by nebulization 2 (two) times daily. 120 mL 11  . aspirin EC 81 MG tablet Take 81 mg by mouth daily.    . blood glucose meter kit and supplies KIT Dispense based on patient and insurance preference. Use up to four times daily as directed. (FOR ICD-9 250.00, 250.01). 1 each 0  . Blood Glucose Monitoring Suppl (ONE TOUCH ULTRA 2) w/Device KIT 1 each by Does not apply route daily. 1 each 0  . enalapril (VASOTEC) 20 MG tablet Take 1 tablet (20 mg total) by mouth daily. 90 tablet 3  . fluticasone (FLONASE) 50 MCG/ACT nasal spray Place 1 spray into both nostrils daily as needed.     . furosemide (LASIX) 20 MG tablet Take 1 tablet (20 mg total) by mouth daily. 90 tablet 3  . gabapentin (NEURONTIN) 300 MG capsule 2 tablets in the morning, 1 tablet in the afternoon and 2 tablets in the evening 450 capsule 3  . glucose blood (ONE TOUCH ULTRA TEST) test strip USE 1 STRIP 2 TIMES DAILY AND AS NEEDED 100 each 12  . ipratropium-albuterol (DUONEB) 0.5-2.5 (3) MG/3ML SOLN TAKE 3 MLS BY NEBULIZATION EVERY 6 (SIX) HOURS AS NEEDED. 360 mL 1  . isosorbide mononitrate (IMDUR) 30 MG 24 hr tablet Take 30 mg by mouth daily as needed (high bp).    Marland Kitchen levothyroxine (SYNTHROID, LEVOTHROID) 125 MCG tablet TAKE 1 TABLET BY MOUTH DAILY 90 tablet 3  . loratadine (CLARITIN) 10 MG tablet TAKE 1 TABLET (10 MG TOTAL) BY MOUTH DAILY. 90 tablet 3  . LORazepam (ATIVAN) 0.5 MG tablet Take 1 tablet (0.5 mg total) by mouth 2 (two) times daily as needed for anxiety. 180 tablet 3  . metFORMIN (GLUCOPHAGE) 1000 MG tablet TAKE 1 TABLET BY MOUTH TWICE A DAY (Patient taking differently: Take 1,000 mg by mouth daily with breakfast. ) 180 tablet 3  . MULTIPLE VITAMIN PO Take 1 tablet by mouth daily.     Marland Kitchen omeprazole (PRILOSEC) 40 MG capsule Take 1 capsule (40 mg total) by mouth daily. 90 capsule 1  . ondansetron (ZOFRAN-ODT) 4 MG disintegrating tablet TAKE 1  TABLET BY MOUTH EVERY 8 HOURS AS NEEDED FOR NAUSEA AND VOMITING 20 tablet 3  . OXYGEN Inhale 4 L into the lungs.    Marland Kitchen PARoxetine (PAXIL) 10 MG tablet Take 1 tablet (10 mg total) by mouth at bedtime. 90 tablet 3  . pravastatin (PRAVACHOL) 10 MG tablet TAKE 1 TABLET (10 MG TOTAL) BY MOUTH DAILY. 90 tablet 3  . roflumilast (DALIRESP) 500 MCG  TABS tablet Take by mouth.    Marland Kitchen azithromycin (ZITHROMAX) 250 MG tablet TAKE 2 TABLETS BY MOUTH TODAY, THEN TAKE 1 TABLET DAILY FOR 4 DAYS (Patient not taking: Reported on 03/09/2019) 6 tablet 2  . HYDROcodone-acetaminophen (NORCO) 10-325 MG tablet Take by mouth.    . mometasone-formoterol (DULERA) 100-5 MCG/ACT AERO Inhale 2 puffs into the lungs 2 (two) times daily. (Patient not taking: Reported on 03/09/2019) 6 Inhaler 3  . oxyCODONE-acetaminophen (PERCOCET/ROXICET) 5-325 MG tablet Take 1 tablet by mouth every 6 (six) hours as needed for severe pain. (Patient not taking: Reported on 03/09/2019) 20 tablet 0  . tiotropium (SPIRIVA) 18 MCG inhalation capsule Place into inhaler and inhale.     No facility-administered medications prior to visit.     Allergies  Allergen Reactions  . Codeine Nausea And Vomiting and Nausea Only    ROS Review of Systems  Constitutional: Negative.   HENT: Negative.   Eyes: Negative.   Respiratory: Negative.   Cardiovascular: Negative.   Gastrointestinal: Negative.   Endocrine: Negative.   Musculoskeletal: Positive for arthralgias.  Skin:       She has a small blister that is healing on her right great medial toe  Allergic/Immunologic: Negative.   Hematological: Negative.   Psychiatric/Behavioral: Negative.   All other systems reviewed and are negative.     Objective:    Physical Exam  Constitutional: She is oriented to person, place, and time. She appears well-developed.  HENT:  Head: Normocephalic and atraumatic.  Right Ear: External ear normal.  Left Ear: External ear normal.  Nose: Nose normal.  Eyes: Pupils are  equal, round, and reactive to light. Conjunctivae are normal. No scleral icterus.  Neck: Neck supple.  Cardiovascular: Normal rate, regular rhythm and normal heart sounds.  Pulmonary/Chest: Effort normal and breath sounds normal.  Abdominal: Soft. Normal appearance.  Musculoskeletal:        General: No edema.     Right lower leg: No edema.     Left lower leg: No edema.  Neurological: She is alert and oriented to person, place, and time.  Skin: Skin is warm and dry.  Psychiatric: She has a normal mood and affect. Her behavior is normal. Judgment and thought content normal.    BP 126/84 (BP Location: Right Arm, Patient Position: Sitting, Cuff Size: Normal)   Pulse 90   Temp 98.3 F (36.8 C) (Oral)   Resp 18   Wt 214 lb 12.8 oz (97.4 kg)   SpO2 100%   BMI 34.67 kg/m  Wt Readings from Last 3 Encounters:  03/09/19 214 lb 12.8 oz (97.4 kg)  12/08/18 212 lb (96.2 kg)  12/08/18 212 lb 12.8 oz (96.5 kg)     Health Maintenance Due  Topic Date Due  . FOOT EXAM  08/26/2018    There are no preventive care reminders to display for this patient.  Lab Results  Component Value Date   TSH 1.010 12/08/2018   Lab Results  Component Value Date   WBC 7.5 12/08/2018   HGB 9.0 (L) 12/08/2018   HCT 28.0 (L) 12/08/2018   MCV 90 12/08/2018   PLT 202 12/08/2018   Lab Results  Component Value Date   NA 147 (H) 12/08/2018   K 4.3 12/08/2018   CO2 31 (H) 12/08/2018   GLUCOSE 89 12/08/2018   BUN 10 12/08/2018   CREATININE 0.46 (L) 12/08/2018   BILITOT <0.2 12/08/2018   ALKPHOS 77 12/08/2018   AST 18 12/08/2018   ALT 7  12/08/2018   PROT 6.2 12/08/2018   ALBUMIN 3.7 12/08/2018   CALCIUM 8.9 12/08/2018   ANIONGAP 4 (L) 09/11/2018   Lab Results  Component Value Date   CHOL 173 12/08/2018   Lab Results  Component Value Date   HDL 59 12/08/2018   Lab Results  Component Value Date   LDLCALC 92 12/08/2018   Lab Results  Component Value Date   TRIG 110 12/08/2018   Lab  Results  Component Value Date   CHOLHDL 2.9 12/08/2018   Lab Results  Component Value Date   HGBA1C 5.3 03/09/2019      Assessment & Plan:   Problem List Items Addressed This Visit    None    Visit Diagnoses    Type 2 diabetes mellitus without complication, without long-term current use of insulin ([place])    -  Primary   Relevant Orders   POCT HgB A1C (Completed)    1. Type 2 diabetes mellitus without complication, without long-term current use of insulin (H[facility]) A1c is 5.3.   prediabetic now. - POCT HgB A1C  2. Essential (primary) hypertension Good control.  3. Congestive heart failure, unspecified HF chronicity, unspecified heart failure type ([place]) Uses home O2  4. Obstructive apnea Wear CPAP.  5. Other hyperlipidemia   6. Class 1 obesity due to excess calories with serious comorbidity and body mass index (BMI) of 33.0 to 33.9 in adult With diabetes hypertension hyperlipidemia and sleep apnea.   No orders of the defined types were placed in this encounter.   Follow-up: No follow-ups on file.    Wilhemena Durie, MD Chair exercises Diabetic diet  Pertinent Labs:    Component Value Date/Time   CHOL 173 12/08/2018 1158   TRIG 110 12/08/2018 1158   HDL 59 12/08/2018 1158   LDLCALC 92 12/08/2018 1158   CREATININE 0.46 (L) 12/08/2018 1158    Wt Readings from Last 3 Encounters:  03/09/19 214 lb 12.8 oz (97.4 kg)  12/08/18 212 lb (96.2 kg)  12/08/18 212 lb 12.8 oz (96.5 kg)    ------------------------------------------------------------------------ I have done the exam and reviewed the chart and it is accurate to the best of my knowledge. Development worker, community has been used and  any errors in dictation or transcription are unintentional. Miguel Aschoff M.D. [facility]

## 2019-03-13 DIAGNOSIS — J449 Chronic obstructive pulmonary disease, unspecified: Secondary | ICD-10-CM | POA: Diagnosis not present

## 2019-03-15 DIAGNOSIS — J449 Chronic obstructive pulmonary disease, unspecified: Secondary | ICD-10-CM | POA: Diagnosis not present

## 2019-03-19 ENCOUNTER — Encounter: Payer: Self-pay | Admitting: Adult Health

## 2019-03-19 ENCOUNTER — Ambulatory Visit (INDEPENDENT_AMBULATORY_CARE_PROVIDER_SITE_OTHER): Payer: Medicare Other | Admitting: Adult Health

## 2019-03-19 ENCOUNTER — Other Ambulatory Visit: Payer: Self-pay

## 2019-03-19 ENCOUNTER — Other Ambulatory Visit: Payer: Self-pay | Admitting: Family Medicine

## 2019-03-19 ENCOUNTER — Telehealth: Payer: Self-pay

## 2019-03-19 VITALS — BP 122/65 | HR 87 | Resp 16 | Ht 66.0 in | Wt 214.0 lb

## 2019-03-19 DIAGNOSIS — J449 Chronic obstructive pulmonary disease, unspecified: Secondary | ICD-10-CM | POA: Diagnosis not present

## 2019-03-19 DIAGNOSIS — J9611 Chronic respiratory failure with hypoxia: Secondary | ICD-10-CM

## 2019-03-19 DIAGNOSIS — R110 Nausea: Secondary | ICD-10-CM

## 2019-03-19 DIAGNOSIS — Z9981 Dependence on supplemental oxygen: Secondary | ICD-10-CM

## 2019-03-19 MED ORDER — ONDANSETRON 4 MG PO TBDP: 4.0000 mg | ORAL_TABLET | Freq: Three times a day (TID) | ORAL | 0 refills | Status: DC | PRN

## 2019-03-19 MED ORDER — ONDANSETRON 4 MG PO TBDP: ORAL_TABLET | ORAL | 3 refills | Status: DC

## 2019-03-19 NOTE — Telephone Encounter (Signed)
[facility] fax[facility] refill request for the following medications:  ondansetron (ZOFRAN-ODT) 4 MG disintegrating tablet Pt requesting for nausea.    Please advise.  Thanks, [employer]

## 2019-03-19 NOTE — Telephone Encounter (Signed)
Please review. Thanks!  

## 2019-03-19 NOTE — Progress Notes (Signed)
[facility] [place], [place] [address]  Internal MEDICINE  Telephone Visit  Patient Name: Kristen Schroeder  [id number]  [id number]  Date of Service: 03/19/2019  I connected with the patient at 325 by telephone and verified the patients identity using two identifiers.   I discussed the limitations, risks, security and privacy concerns of performing an evaluation and management service by telephone and the availability of in person appointments. I also discussed with the patient that there may be a patient responsible charge related to the service.  The patient expressed understanding and agrees to proceed.    Chief Complaint  Patient presents with  . COPD    HPI  Pt is following up on COPD.  She reports since her last visit she had a SBO surgically repaired twice about 6 months ago. She is reporting some continued nausea at meal times and it is preventing her from eating much food. She reports her breathing has been good.  She denies any other issues.  She continues to use oxygen at 3.5lpm.  She reports needing this much because she uses a long cord in the house.       Current Medication: Outpatient Encounter Medications as of 03/19/2019  Medication Sig Note  . albuterol (PROVENTIL HFA;VENTOLIN HFA) 108 (90 Base) MCG/ACT inhaler Inhale 2 puff every 4-6 hrs as needed for SOB   . arformoterol (BROVANA) 15 MCG/2ML NEBU Take 2 mLs (15 mcg total) by nebulization 2 (two) times daily. 10/15/2018: Patient states she does not have this medication  . aspirin EC 81 MG tablet Take 81 mg by mouth daily.   . blood glucose meter kit and supplies KIT Dispense based on patient and insurance preference. Use up to four times daily as directed. (FOR ICD-9 250.00, 250.01).   . Blood Glucose Monitoring Suppl (ONE TOUCH ULTRA 2) w/Device KIT 1 each by Does not apply route daily.   . enalapril (VASOTEC) 20 MG tablet Take 1 tablet (20 mg total) by mouth daily.   . fluticasone (FLONASE) 50  MCG/ACT nasal spray Place 1 spray into both nostrils daily as needed.    . furosemide (LASIX) 20 MG tablet Take 1 tablet (20 mg total) by mouth daily.   Marland Kitchen gabapentin (NEURONTIN) 300 MG capsule 2 tablets in the morning, 1 tablet in the afternoon and 2 tablets in the evening   . glucose blood (ONE TOUCH ULTRA TEST) test strip USE 1 STRIP 2 TIMES DAILY AND AS NEEDED   . HYDROcodone-acetaminophen (NORCO) 10-325 MG tablet Take by mouth. 10/15/2018: Patient no longer has this medication  . ipratropium-albuterol (DUONEB) 0.5-2.5 (3) MG/3ML SOLN TAKE 3 MLS BY NEBULIZATION EVERY 6 (SIX) HOURS AS NEEDED.   Marland Kitchen isosorbide mononitrate (IMDUR) 30 MG 24 hr tablet Take 30 mg by mouth daily as needed (high bp). 10/15/2018: Patient states she takes this medication every day. Patient states her pressures at home run 130s. See last 3 BP  . levothyroxine (SYNTHROID, LEVOTHROID) 125 MCG tablet TAKE 1 TABLET BY MOUTH DAILY   . loratadine (CLARITIN) 10 MG tablet TAKE 1 TABLET (10 MG TOTAL) BY MOUTH DAILY. 10/15/2018: Takes as needed  . LORazepam (ATIVAN) 0.5 MG tablet Take 1 tablet (0.5 mg total) by mouth 2 (two) times daily as needed for anxiety. 10/15/2018: Has not taken "in a while"  . metFORMIN (GLUCOPHAGE) 1000 MG tablet TAKE 1 TABLET BY MOUTH TWICE A DAY (Patient taking differently: Take 1,000 mg by mouth daily with breakfast. )   .  mometasone-formoterol (DULERA) 100-5 MCG/ACT AERO Inhale 2 puffs into the lungs 2 (two) times daily. 10/15/2018: Patient has one inhaler left with 38 doses. Uses twice a day, sometimes as a rescue but not often. Has 3 symbicort 60 puff each to use until [facility] can be purchased.  . MULTIPLE VITAMIN PO Take 1 tablet by mouth daily.    Marland Kitchen omeprazole (PRILOSEC) 40 MG capsule Take 1 capsule (40 mg total) by mouth daily. 10/15/2018: Taking as needed  . ondansetron (ZOFRAN-ODT) 4 MG disintegrating tablet TAKE 1 TABLET BY MOUTH EVERY 8 HOURS AS NEEDED FOR NAUSEA AND VOMITING   .  oxyCODONE-acetaminophen (PERCOCET/ROXICET) 5-325 MG tablet Take 1 tablet by mouth every 6 (six) hours as needed for severe pain. 10/15/2018: Patient took all this medication and no refills  . OXYGEN Inhale 4 L into the lungs. 10/15/2018: Patient uses 3 to 3 1/2 liters continuously  . PARoxetine (PAXIL) 10 MG tablet Take 1 tablet (10 mg total) by mouth at bedtime. 10/15/2018: Patient did a self ween and does not feel like she need antidepressant any longer  . pravastatin (PRAVACHOL) 10 MG tablet TAKE 1 TABLET (10 MG TOTAL) BY MOUTH DAILY.   . roflumilast (DALIRESP) 500 MCG TABS tablet Take by mouth. 10/15/2018: Patient is unsure if she is taking this medication however recent pulmonary visit indicates that she is.  . tiotropium (SPIRIVA) 18 MCG inhalation capsule Place into inhaler and inhale. 10/15/2018: Patient will resume taking once dulera inhaler is completed (until she can afford new dulera)  . ondansetron (ZOFRAN ODT) 4 MG disintegrating tablet Take 1 tablet (4 mg total) by mouth every 8 (eight) hours as needed for nausea or vomiting.   . [DISCONTINUED] azithromycin (ZITHROMAX) 250 MG tablet TAKE 2 TABLETS BY MOUTH TODAY, THEN TAKE 1 TABLET DAILY FOR 4 DAYS (Patient not taking: Reported on 03/09/2019)    No facility-administered encounter medications on file as of 03/19/2019.     Surgical History: Past Surgical History:  Procedure Laterality Date  . ABDOMINAL HYSTERECTOMY    . BOWEL RESECTION N/A 04/14/2018   Procedure: SMALL BOWEL RESECTION;  Surgeon: Jules Husbands, MD;  Location: [facility] ORS;  Service: General;  Laterality: N/A;  . BREAST BIOPSY Right    benign  . CATARACT EXTRACTION W/PHACO Left 07/03/2016   Procedure: CATARACT EXTRACTION PHACO AND INTRAOCULAR LENS PLACEMENT (IOC);  Surgeon: Birder Robson, MD;  Location: [facility] ORS;  Service: Ophthalmology;  Laterality: Left;  Lot: [id number] H [place]: 00:40.1 AP%: 17.4 CDE:6.94  . HERNIA REPAIR    . LAPAROTOMY N/A 09/04/2018   Procedure:  EXPLORATORY LAPAROTOMY;  Surgeon: Olean Ree, MD;  Location: [facility] ORS;  Service: General;  Laterality: N/A;  . TUBAL LIGATION    . VENTRAL HERNIA REPAIR N/A 04/14/2018   Procedure: HERNIA REPAIR VENTRAL ADULT;  Surgeon: Jules Husbands, MD;  Location: [facility] ORS;  Service: General;  Laterality: N/A;    Medical History: Past Medical History:  Diagnosis Date  . Arthritis   . COPD (chronic obstructive pulmonary disease) ([place])   . Diabetes mellitus without complication ([place])   . Dysrhythmia   . Heart murmur   . History of orthopnea   . Hypertension   . Hypothyroidism   . Neuropathy   . Oxygen dependent    3L  CONTINUOUS  . Pain CHRONIC BACK PAIN  . Shortness of breath dyspnea   . Wheezing     Family History: Family History  Problem Relation Age of Onset  . Heart disease Mother   .  Drug abuse Other   . Hypertension Other     Social History   Socioeconomic History  . Marital status: Married    Spouse name: Not on file  . Number of children: 4  . Years of education: Not on file  . Highest education level: 8th grade  Occupational History  . Occupation: retired  Scientific laboratory technician  . Financial resource strain: Not very hard  . Food insecurity:    Worry: Never true    Inability: Never true  . Transportation needs:    Medical: No    Non-medical: No  Tobacco Use  . Smoking status: Former Smoker    Packs/day: 0.50    Years: 15.00    Pack years: 7.50  . Smokeless tobacco: Never Used  . Tobacco comment: 30-40 years ago  Substance and Sexual Activity  . Alcohol use: No    Alcohol/week: 0.0 standard drinks  . Drug use: No  . Sexual activity: Never  Lifestyle  . Physical activity:    Days per week: 0 days    Minutes per session: 0 min  . Stress: Not at all  Relationships  . Social connections:    Talks on phone: Patient refused    Gets together: Patient refused    Attends religious service: Patient refused    Active member of club or organization: Patient refused     Attends meetings of clubs or organizations: Patient refused    Relationship status: Patient refused  . Intimate partner violence:    Fear of current or ex partner: No    Emotionally abused: No    Physically abused: No    Forced sexual activity: No  Other Topics Concern  . Not on file  Social History Narrative  . Not on file      Review of Systems  Constitutional: Negative for chills, fatigue and unexpected weight change.  HENT: Negative for congestion, rhinorrhea, sneezing and sore throat.   Eyes: Negative for photophobia, pain and redness.  Respiratory: Negative for cough, chest tightness and shortness of breath.   Cardiovascular: Negative for chest pain and palpitations.  Gastrointestinal: Negative for abdominal pain, constipation, diarrhea, nausea and vomiting.  Endocrine: Negative.   Genitourinary: Negative for dysuria and frequency.  Musculoskeletal: Negative for arthralgias, back pain, joint swelling and neck pain.  Skin: Negative for rash.  Allergic/Immunologic: Negative.   Neurological: Negative for tremors and numbness.  Hematological: Negative for adenopathy. Does not bruise/bleed easily.  Psychiatric/Behavioral: Negative for behavioral problems and sleep disturbance. The patient is not nervous/anxious.     Vital Signs: BP 122/65   Pulse 87   Resp 16   Ht '5\' 6"'  (1.676 m)   Wt 214 lb (97.1 kg)   SpO2 98%   BMI 34.54 kg/m    Observation/Objective:  Speaking in full sentences, conversing with ease.  NAD noted.    Assessment/Plan: 1. Chronic obstructive pulmonary disease, unspecified COPD type (HCC) Severe disease, oxygen dependent, Stable, continue present management.   2. Chronic respiratory failure with hypoxia (HCC) Good relief of symptoms with oxygen, continue to use oxygen as prescribed.   3. Oxygen dependent Continue to use oxygen as directed using 2-4 liters.    4. Nausea Provided with RX for Zofran 57m tabs to take before meals to combat  nausea so that she can increase her PO intake.    General Counseling: Stephanye verbalizes understanding of the findings of today's phone visit and agrees with plan of treatment. I have discussed any further diagnostic evaluation  that may be needed or ordered today. We also reviewed her medications today. she has been encouraged to call the office with any questions or concerns that should arise related to todays visit.    No orders of the defined types were placed in this encounter.   Meds ordered this encounter  Medications  . ondansetron (ZOFRAN ODT) 4 MG disintegrating tablet    Sig: Take 1 tablet (4 mg total) by mouth every 8 (eight) hours as needed for nausea or vomiting.    Dispense:  10 tablet    Refill:  0    Time spent: [place] AGNP-C Internal medicine

## 2019-03-19 NOTE — Telephone Encounter (Signed)
ERROR

## 2019-03-19 NOTE — Patient Instructions (Signed)
Chronic Obstructive Pulmonary Disease Chronic obstructive pulmonary disease (COPD) is a long-term (chronic) lung problem. When you have COPD, it is hard for air to get in and out of your lungs. Usually the condition gets worse over time, and your lungs will never return to normal. There are things you can do to keep yourself as healthy as possible.  Your doctor may treat your condition with: ? Medicines. ? Oxygen. ? Lung surgery.  Your doctor may also recommend: ? Rehabilitation. This includes steps to make your body work better. It may involve a team of specialists. ? Quitting smoking, if you smoke. ? Exercise and changes to your diet. ? Comfort measures (palliative care). Follow these instructions at home: Medicines  Take over-the-counter and prescription medicines only as told by your doctor.  Talk to your doctor before taking any cough or allergy medicines. You may need to avoid medicines that cause your lungs to be dry. Lifestyle  If you smoke, stop. Smoking makes the problem worse. If you need help quitting, ask your doctor.  Avoid being around things that make your breathing worse. This may include smoke, chemicals, and fumes.  Stay active, but remember to rest as well.  Learn and use tips on how to relax.  Make sure you get enough sleep. Most adults need at least 7 hours of sleep every night.  Eat healthy foods. Eat smaller meals more often. Rest before meals. Controlled breathing Learn and use tips on how to control your breathing as told by your doctor. Try:  Breathing in (inhaling) through your nose for 1 second. Then, pucker your lips and breath out (exhale) through your lips for 2 seconds.  Putting one hand on your belly (abdomen). Breathe in slowly through your nose for 1 second. Your hand on your belly should move out. Pucker your lips and breathe out slowly through your lips. Your hand on your belly should move in as you breathe out.  Controlled coughing Learn  and use controlled coughing to clear mucus from your lungs. Follow these steps: 1. Lean your head a little forward. 2. Breathe in deeply. 3. Try to hold your breath for 3 seconds. 4. Keep your mouth slightly open while coughing 2 times. 5. Spit any mucus out into a tissue. 6. Rest and do the steps again 1 or 2 times as needed. General instructions  Make sure you get all the shots (vaccines) that your doctor recommends. Ask your doctor about a flu shot and a pneumonia shot.  Use oxygen therapy and pulmonary rehabilitation if told by your doctor. If you need home oxygen therapy, ask your doctor if you should buy a tool to measure your oxygen level (oximeter).  Make a COPD action plan with your doctor. This helps you to know what to do if you feel worse than usual.  Manage any other conditions you have as told by your doctor.  Avoid going outside when it is very hot, cold, or humid.  Avoid people who have a sickness you can catch (contagious).  Keep all follow-up visits as told by your doctor. This is important. Contact a doctor if:  You cough up more mucus than usual.  There is a change in the color or thickness of the mucus.  It is harder to breathe than usual.  Your breathing is faster than usual.  You have trouble sleeping.  You need to use your medicines more often than usual.  You have trouble doing your normal activities such as getting dressed   or walking around the house. Get help right away if:  You have shortness of breath while resting.  You have shortness of breath that stops you from: ? Being able to talk. ? Doing normal activities.  Your chest hurts for longer than 5 minutes.  Your skin color is more blue than usual.  Your pulse oximeter shows that you have low oxygen for longer than 5 minutes.  You have a fever.  You feel too tired to breathe normally. Summary  Chronic obstructive pulmonary disease (COPD) is a long-term lung problem.  The way your  lungs work will never return to normal. Usually the condition gets worse over time. There are things you can do to keep yourself as healthy as possible.  Take over-the-counter and prescription medicines only as told by your doctor.  If you smoke, stop. Smoking makes the problem worse. This information is not intended to replace advice given to you by your health care provider. Make sure you discuss any questions you have with your health care provider. Document Released: 04/09/2008 Document Revised: 11/26/2016 Document Reviewed: 11/26/2016 Elsevier Interactive Patient Education  2019 Elsevier Inc.  

## 2019-03-20 ENCOUNTER — Other Ambulatory Visit: Payer: Self-pay | Admitting: Adult Health

## 2019-03-25 ENCOUNTER — Ambulatory Visit: Payer: Self-pay | Admitting: Family Medicine

## 2019-03-27 ENCOUNTER — Telehealth: Payer: Self-pay | Admitting: Family Medicine

## 2019-03-27 NOTE — Telephone Encounter (Signed)
Patient  started having a productive cough with yellow phlegm and sneezing yesterday morning. The symptoms have improved this morning. Patient denies any shortness of breath, fever, or headache. Patient is taking Claritin daily. She would like a prescription for an antibiotic to have just in case. I advised patient that symptoms do not sound like a bacterial infection. Patient request that I send a message back.

## 2019-03-27 NOTE — Telephone Encounter (Signed)
Pt called saying she has started sneezing and would like a zpak sent to the pharmacy.  She says Dr. Reece Agar usually gives her one.  [facility]  [place]  CB#  [phone]  Thanks Barth Kirks

## 2019-03-27 NOTE — Telephone Encounter (Signed)
Please review

## 2019-04-02 DIAGNOSIS — J449 Chronic obstructive pulmonary disease, unspecified: Secondary | ICD-10-CM | POA: Diagnosis not present

## 2019-04-03 ENCOUNTER — Other Ambulatory Visit: Payer: Self-pay | Admitting: Family Medicine

## 2019-04-03 DIAGNOSIS — R600 Localized edema: Secondary | ICD-10-CM

## 2019-04-03 NOTE — Telephone Encounter (Signed)
Patient is requesting a refill on Furosemide

## 2019-04-09 ENCOUNTER — Ambulatory Visit (INDEPENDENT_AMBULATORY_CARE_PROVIDER_SITE_OTHER): Payer: Medicare Other

## 2019-04-09 DIAGNOSIS — I509 Heart failure, unspecified: Secondary | ICD-10-CM | POA: Diagnosis not present

## 2019-04-09 DIAGNOSIS — J42 Unspecified chronic bronchitis: Secondary | ICD-10-CM | POA: Diagnosis not present

## 2019-04-09 NOTE — Chronic Care Management (AMB) (Signed)
  Chronic Care Management   Follow Up Note   04/09/2019 Name: Kristen Schroeder MRN: [id number] DOB: 03-01-1941  Referred by: Maple Hudson., MD Reason for referral : Chronic Care Management (returning patient call)   Subjective: "I was wondering if I could get some help with cleaning my home a couple of days a week cause I just am not able any more"   Objective:  Assessment: Kristen Schroeder a 79 y.o.year old femalewho sees Maple Hudson., MDfor primary care. Dr. Leone Brand the CCM team to consult the patient for assistance with chronic disease management related to HTN, chronic Bronchitis, O2 dependency and CHF. Patient also needs financial assistance with her [facility] inhaler.Referral was placed 09/24/18 during patient's routine office visit.Kristen Schroeder in the office by the CCM RN CM to discuss her healthcare goalson 10/15/18 and continues to be followed by CCM Team. Today CCM RN CM followed up with Kristen Schroeder to assesses for needs and additional health goals.   Review of patient status, including review of consultants reports, relevant laboratory and other test results, and collaboration with appropriate care team members and the patient's provider was performed as part of comprehensive patient evaluation and provision of chronic care management services.    Goals Addressed            This Visit's Progress   . I am wondering if I can get some help with my house cleaning (pt-stated)       Kristen Schroeder called CCM RN CM today to ask if RN CM was aware of any resources available to assist her with housekeeping. She has multiple medical conditions that make it hard for her to keep house without significant shortness of breath. Her husband helps as much as he can. The Alarie's have an adult child with significant mental health issues that lives with them. Kristen Schroeder states his mental illness causes him to not wish to bathe and not pick up after himself. This is beginning  to take a toll on her but she, her husband, and daughter wish for him to remain at home.   Current Barriers:  Marland Kitchen Knowledge Deficits related to possible available resources for in home help  Nurse Case Manager Clinical Goal(s):  Marland Kitchen Over the next 7 days, patient will understand available resources for receiving assistance with home maintenance   Interventions:  . Collaboration with CCM LCSW  Patient Self Care Activities:  . Currently UNABLE TO independently clean her home without significant shortness of breath  Initial goal documentation               Telephone follow up appointment with care management team member scheduled for: 04/15/2019 as previously scheduled   Kanyon Seibold E. Suzie Portela, RN, BSN Nurse Care Coordinator [facility] Care Management [phone]

## 2019-04-09 NOTE — Patient Instructions (Addendum)
  Thank you allowing the Chronic Care Management Team to be a part of your care! It was a pleasure speaking with you today!  CCM (Chronic Care Management) Team   Yvone Neu RN, BSN Nurse Care Coordinator  [phone]  Karalee Height PharmD  Clinical Pharmacist  [phone]   Verna Czech, LCSW Clinical Social Worker [phone]  Goals Addressed            This Visit's Progress   . I am wondering if I can get some help with my house cleaning (pt-stated)       Current Barriers:  Marland Kitchen Knowledge Deficits related to possible available resources for in home help  Nurse Case Manager Clinical Goal(s):  Marland Kitchen Over the next 7 days, patient will understand available resources for receiving assistance with home maintenance   Interventions:  . Collaboration with CCM LCSW  Patient Self Care Activities:  . Currently UNABLE TO independently clean her home without significant shortness of breath  Initial goal documentation       The patient verbalized understanding of instructions provided today and declined a print copy of patient instruction materials.   Telephone follow up appointment with care management team member scheduled for: 04/15/2019  SYMPTOMS OF A STROKE   You have any symptoms of stroke. "BE FAST" is an easy way to remember the main warning signs: ? B - Balance. Signs are dizziness, sudden trouble walking, or loss of balance. ? E - Eyes. Signs are trouble seeing or a sudden change in how you see. ? F - Face. Signs are sudden weakness or loss of feeling of the face, or the face or eyelid drooping on one side. ? A - Arms. Signs are weakness or loss of feeling in an arm. This happens suddenly and usually on one side of the body. ? S - Speech. Signs are sudden trouble speaking, slurred speech, or trouble understanding what people say. ? T - Time. Time to call emergency services. Write down what time symptoms started.  You have other signs of stroke, such as: ? A  sudden, very bad headache with no known cause. ? Feeling sick to your stomach (nausea). ? Throwing up (vomiting). ? Jerky movements you cannot control (seizure).  SYMPTOMS OF A HEART ATTACK  What are the signs or symptoms? Symptoms of this condition include:  Chest pain. It may feel like: ? Crushing or squeezing. ? Tightness, pressure, fullness, or heaviness.  Pain in the arm, neck, jaw, back, or upper body.  Shortness of breath.  Heartburn.  Indigestion.  Nausea.  Cold sweats.  Feeling tired.  Sudden lightheadedness.

## 2019-04-13 ENCOUNTER — Other Ambulatory Visit: Payer: Self-pay | Admitting: Family Medicine

## 2019-04-15 ENCOUNTER — Telehealth: Payer: Self-pay

## 2019-04-15 DIAGNOSIS — J449 Chronic obstructive pulmonary disease, unspecified: Secondary | ICD-10-CM | POA: Diagnosis not present

## 2019-04-16 ENCOUNTER — Other Ambulatory Visit: Payer: Self-pay

## 2019-04-16 ENCOUNTER — Ambulatory Visit: Payer: Self-pay

## 2019-04-16 DIAGNOSIS — J42 Unspecified chronic bronchitis: Secondary | ICD-10-CM

## 2019-04-16 DIAGNOSIS — E119 Type 2 diabetes mellitus without complications: Secondary | ICD-10-CM

## 2019-04-16 NOTE — Chronic Care Management (AMB) (Signed)
Chronic Care Management   Follow Up Note   04/16/2019 Name: Kristen Schroeder MRN: [id number] DOB: [birth year]  Referred by: Jerrol Banana., MD Reason for referral : Chronic Care Management (follow up on house cleaning resources)   Subjective: "Im doing real good"   Objective:  BP Readings from Last 3 Encounters:  03/19/19 122/65  03/09/19 126/84  12/08/18 (!) 128/54   Lab Results  Component Value Date   HGBA1C 5.3 03/09/2019    Assessment: Kristen Schroeder a 78 y.o.year old femalewho sees Jerrol Banana., MDfor primary care. Dr. Orma Flaming the CCM team to consult the patient for assistance with chronic disease management related to HTN, chronic Bronchitis, O2 dependency and CHF. Patient also needs financial assistance with her [facility] inhaler.Referral was placed 09/24/18 during patient's routine office visit.Kristen Schroeder in the office by the CCM RN CM to discuss her healthcare goalson 12/11/19and continues to be followed by CCM Team. Today CCM RN CM followed up with Kristen Schroeder to assesses for needs and additional health goals.  Review of patient status, including review of consultants reports, relevant laboratory and other test results, and collaboration with appropriate care team members and the patient's provider was performed as part of comprehensive patient evaluation and provision of chronic care management services.    Goals Addressed            This Visit's Progress   . COMPLETED: I am wondering if I can get some help with my house cleaning (pt-stated)       Current Barriers:  Marland Kitchen Knowledge Deficits related to possible available resources for in home help  Nurse Case Manager Clinical Goal(s):  Marland Kitchen Over the next 7 days, patient will understand available resources for receiving assistance with home maintenance   Interventions:  . Collaboration with CCM LCSW  Patient Self Care Activities:  . Currently UNABLE TO independently clean her home  without significant shortness of breath  Initial goal documentation      . COMPLETED: I want to stay well and out of the hospital (pt-stated)       Current Barriers:  . Financial Constraints.  Kristen Schroeder barriers  Nurse Case Manager Clinical Goal(s):   Over the next 60 days, patient will demonstrate improved health management independence as evidenced bytaking medicatins as presribed, follow COPD action plan and notify MD when in the yellow zone, attend all medical provider appointments, and call provider with any questions or concerns   Over the next 60 days, patient will not experience hospitalization or ED visit for chronic conditions  Interventions:  . Discussed plans with patient for ongoing care management follow up and provided patient with direct contact information for care management team  . Reviewed daily BP and CBG readings . Reviewed COPD action plan  Patient Self Care Activities:  . Self administers medications as prescribed . Attends all scheduled provider appointments . Calls pharmacy for medication refills . Attends church or other social activities . Performs ADL's independently . Calls provider office for new concerns or questions  Please see past updates related to this goal by clicking on the "Past Updates" button in the selected goal          Kristen Schroeder has met her nursing goals. She has been provided with RN CM contact information and utilizes when she has a question or concern. Follow up appointment will not be scheduled at time.  [name] E. Rollene Rotunda, RN, BSN Nurse Care Coordinator [facility] Care Management [phone])  [phone]  

## 2019-04-16 NOTE — Patient Instructions (Addendum)
Thank you allowing the Chronic Care Management Team to be a part of your care! It was a pleasure speaking with you today!  1. Take your medications as prescribed 2. Attend all provider appointments 3. Follow your CHF and COPD action plan 4. Continue to check your BP and CBG as prescribed and weight daily (record) 5. Continue to follow CDC guidelines for COVID-19 infection prevention 6. Try to exercise daily, you can do seated exercise that will decrease your shortness of breath during exertion.  CCM (Chronic Care Management) Team   Trish Fountain RN, BSN Nurse Care Coordinator  [phone]  Ruben Reason PharmD  Clinical Pharmacist  [phone]   Elliot Gurney, LCSW Clinical Social Worker [phone]  Goals Addressed            This Visit's Progress   . COMPLETED: I am wondering if I can get some help with my house cleaning (pt-stated)       Current Barriers:  Marland Kitchen Knowledge Deficits related to possible available resources for in home help  Nurse Case Manager Clinical Goal(s):  Marland Kitchen Over the next 7 days, patient will understand available resources for receiving assistance with home maintenance   Interventions:  . Collaboration with CCM LCSW  Patient Self Care Activities:  . Currently UNABLE TO independently clean her home without significant shortness of breath  Initial goal documentation      . COMPLETED: I want to stay well and out of the hospital (pt-stated)       Current Barriers:  . Financial Constraints.  [name] barriers  Nurse Case Manager Clinical Goal(s):   Over the next 60 days, patient will demonstrate improved health management independence as evidenced bytaking medicatins as presribed, follow COPD action plan and notify MD when in the yellow zone, attend all medical provider appointments, and call provider with any questions or concerns   Over the next 60 days, patient will not experience hospitalization or ED visit for chronic  conditions  Interventions:  . Discussed plans with patient for ongoing care management follow up and provided patient with direct contact information for care management team  . Reviewed daily BP and CBG readings . Reviewed COPD action plan  Patient Self Care Activities:  . Self administers medications as prescribed . Attends all scheduled provider appointments . Calls pharmacy for medication refills . Attends church or other social activities . Performs ADL's independently . Calls provider office for new concerns or questions  Please see past updates related to this goal by clicking on the "Past Updates" button in the selected goal         The patient verbalized understanding of instructions provided today and declined a print copy of patient instruction materials.   The patient has been provided with contact information for the care management team and has been advised to call with any health related questions or concerns.   SYMPTOMS OF A STROKE   You have any symptoms of stroke. "BE FAST" is an easy way to remember the main warning signs: ? B - Balance. Signs are dizziness, sudden trouble walking, or loss of balance. ? E - Eyes. Signs are trouble seeing or a sudden change in how you see. ? F - Face. Signs are sudden weakness or loss of feeling of the face, or the face or eyelid drooping on one side. ? A - Arms. Signs are weakness or loss of feeling in an arm. This happens suddenly and usually on one side of the body. ?  S - Speech. Signs are sudden trouble speaking, slurred speech, or trouble understanding what people say. ? T - Time. Time to call emergency services. Write down what time symptoms started.  You have other signs of stroke, such as: ? A sudden, very bad headache with no known cause. ? Feeling sick to your stomach (nausea). ? Throwing up (vomiting). ? Jerky movements you cannot control (seizure).  SYMPTOMS OF A HEART ATTACK  What are the signs or  symptoms? Symptoms of this condition include:  Chest pain. It may feel like: ? Crushing or squeezing. ? Tightness, pressure, fullness, or heaviness.  Pain in the arm, neck, jaw, back, or upper body.  Shortness of breath.  Heartburn.  Indigestion.  Nausea.  Cold sweats.  Feeling tired.  Sudden lightheadedness.

## 2019-05-03 DIAGNOSIS — J449 Chronic obstructive pulmonary disease, unspecified: Secondary | ICD-10-CM | POA: Diagnosis not present

## 2019-05-11 ENCOUNTER — Telehealth: Payer: Self-pay | Admitting: Family Medicine

## 2019-05-11 DIAGNOSIS — Z20822 Contact with and (suspected) exposure to covid-19: Secondary | ICD-10-CM

## 2019-05-11 MED ORDER — PREDNISONE 10 MG (21) PO TBPK: ORAL_TABLET | ORAL | 0 refills | Status: DC

## 2019-05-11 NOTE — Telephone Encounter (Signed)
Spoke with patient, scheduled her for COVID 19 test tomorrow at 11:15 am at [facility] building. Testing protocol reviewed with patient.

## 2019-05-11 NOTE — Telephone Encounter (Signed)
Pt called this am around 7 am and spoke with the nurse call line regarding a cough and low grade temp .  She was asking if Dr. Rosanna Randy will call in an antibiotic.  No Z-pak.  She says that does not help her.  She has not been tested for Covid.  [facility]  CB#  [phone]

## 2019-05-11 NOTE — Telephone Encounter (Signed)
Medication was sent into the pharmacy. Please schedule covid test for cough and fever. Thanks!

## 2019-05-11 NOTE — Telephone Encounter (Signed)
Please review. Thanks!  

## 2019-05-11 NOTE — Telephone Encounter (Signed)
Please test for Covid.Try prednisone 10mg --6 day taper.

## 2019-05-12 ENCOUNTER — Other Ambulatory Visit: Payer: Medicare Other

## 2019-05-12 DIAGNOSIS — Z20822 Contact with and (suspected) exposure to covid-19: Secondary | ICD-10-CM

## 2019-05-12 DIAGNOSIS — R6889 Other general symptoms and signs: Secondary | ICD-10-CM | POA: Diagnosis not present

## 2019-05-13 ENCOUNTER — Other Ambulatory Visit: Payer: Self-pay

## 2019-05-13 MED ORDER — HYDROCODONE-HOMATROPINE 5-1.5 MG/5ML PO SYRP: 5.0000 mL | ORAL_SOLUTION | Freq: Four times a day (QID) | ORAL | 0 refills | Status: DC | PRN

## 2019-05-13 NOTE — Telephone Encounter (Signed)
Hycodin--5 cc po q 6 hours prn cough--120cc

## 2019-05-13 NOTE — Telephone Encounter (Signed)
Ordered. Please send into the pharmacy. Thanks!

## 2019-05-13 NOTE — Telephone Encounter (Signed)
Pt would like a prescription to help with her cough.  Pt states she does not tolerate prednisone.  (Pt was tested for Covid-19 yesterday)   Pharmacy [facility].   Contact Number: [phone]

## 2019-05-14 ENCOUNTER — Other Ambulatory Visit: Payer: Self-pay

## 2019-05-14 ENCOUNTER — Telehealth: Payer: Self-pay

## 2019-05-14 DIAGNOSIS — R059 Cough, unspecified: Secondary | ICD-10-CM

## 2019-05-14 DIAGNOSIS — R05 Cough: Secondary | ICD-10-CM

## 2019-05-14 MED ORDER — BENZONATATE 200 MG PO CAPS: 200.0000 mg | ORAL_CAPSULE | Freq: Three times a day (TID) | ORAL | 0 refills | Status: DC | PRN

## 2019-05-14 NOTE — Telephone Encounter (Signed)
Tessalon perles sent in

## 2019-05-14 NOTE — Telephone Encounter (Signed)
Patient called office stating that she has had cold like symptoms over the last several days and Dr. Rosanna Randy had prescribed her Hycodan to treat for cough, patient states that she took cough suppressant and it made her very nauseas and sick to her stomach. Patient is requesting that another medication be sent in to help with cough that is not as strong. KW

## 2019-05-14 NOTE — Patient Outreach (Signed)
EMMIPrevent call-referred to Joellyn Quails RN

## 2019-05-14 NOTE — Telephone Encounter (Signed)
Please review for Dr. Gilbert  Thanks,   -Laura  

## 2019-05-14 NOTE — Telephone Encounter (Signed)
Pt advised.   Thanks,   -Sharia Averitt  

## 2019-05-15 ENCOUNTER — Other Ambulatory Visit: Payer: Self-pay | Admitting: *Deleted

## 2019-05-15 ENCOUNTER — Other Ambulatory Visit: Payer: Self-pay | Admitting: Adult Health

## 2019-05-15 ENCOUNTER — Ambulatory Visit: Payer: Self-pay

## 2019-05-15 DIAGNOSIS — J449 Chronic obstructive pulmonary disease, unspecified: Secondary | ICD-10-CM | POA: Diagnosis not present

## 2019-05-15 DIAGNOSIS — Z7189 Other specified counseling: Secondary | ICD-10-CM

## 2019-05-15 NOTE — Chronic Care Management (AMB) (Signed)
  Chronic Care Management   Follow Up Note   05/15/2019 Name: ROBBY PIRANI MRN: [id number] DOB: 1941-10-06  Referred by: Jerrol Banana., MD Reason for referral : Chronic Care Management (follow up Emmi Call)   Subjective: "I just feel so bad, I have never felt like this"   Objective:  Assessment: Ms. Rayli Wiederhold is a 78 year old female patient of Dr. Miguel Aschoff, who is well know to the CCM Team. Ms. Zollie has been doing very well managing her health and has been very cautious to remain at home during covid-19 pandemic. Today, RN CM received an incoming call from [place] Joellyn Quails to collaborate regarding a referral Ms. Lavina Hamman received from Ms. Kahan's health plan. CCM RN CM followed up with Ms. Navarrette as she is already established in the chronic case management program.  Review of patient status, including review of consultants reports, relevant laboratory and other test results, and collaboration with appropriate care team members and the patient's provider was performed as part of comprehensive patient evaluation and provision of chronic care management services.    Goals Addressed            This Visit's Progress   . "I just feel terrible and can't do anything" (pt-stated)       Upon follow up call to patient as a "well check", patient states she is very sick as is her husband and disabled adult son of the home. Ms. Verge states she has had a "very bad cold with a fever" for a couple of days. She informed her PCP and was tested on Tuesday for Covid. She denies breathing difficulty however she has a terrible cough and severe malaise. She has isolated herself to her room, her husband to the living room, and her son to his room. RN CM also spoke to [place], son not of the home and secondary caregiver. Cletus Gash checks on them daily and provides needed essentials such as food. Cletus Gash states everyone is doing OK and no one of the home needs emergency care. They all are  wearing mask and trying to stay isolated 6 feet apart in the home.   Current Barriers:  . Lack of support in home  Nurse Case Manager Clinical Goal(s):  Marland Kitchen Over the next 72 hours, patient will present to ED if covid-like symptoms worsen  Interventions:  . Provided education to son Cletus Gash and patient regarding covid-19 s/s, infection prevention, and assessed for testing . Discussed importance of self isolation until test results are confirmed . Assessed for need for emergent care . Stressed importance of seeking emergency care if respiratory symptoms worsen  Patient Self Care Activities:  . Patient will follow covid 19 recovery care suggestions by remaining hydrated, taking all medications as prescribed, completing her daily HF and COPD action plan assessment, calling 911 with respiratory difficulty  Initial goal documentation         Telephone follow up appointment with care management team member scheduled for: Monday   Patina Spanier E. Rollene Rotunda, RN, BSN Nurse Care Coordinator [facility] Care Management [phone]

## 2019-05-15 NOTE — Patient Outreach (Signed)
[facility]) Care Management  05/15/2019  BLOSSOM CRUME 25-Mar-1941 [id number]   Telephone Screen  Referral Date: 05/14/19 Referral Source: EMMI Prevent Referral Reason: EMMI prevent score 11  Insurance: united [employer] care medicare     With review of this referral and Epic information, [facility] telephonic RN CM notes that she is followed by [facility] embedded RN CM, P Rollene Rotunda [facility] RN CM spoke with Baldo Daub to find that Mrs Akerson remains active with the [facility] embedded RN CM, Baldo Daub  Plan: [facility] RN CM will close this case as this patient is being followed by an external care management program- [facility] embedded RN CM, P Aron Baba L. Lavina Hamman, RN, BSN, [place] Coordinator Office number [phone] Mobile number [phone]  Main [facility] number [phone] Fax number [phone]

## 2019-05-16 LAB — NOVEL CORONAVIRUS, NAA: SARS-CoV-2, NAA: DETECTED — AB

## 2019-05-16 NOTE — Patient Instructions (Addendum)
  Thank you allowing the Chronic Care Management Team to be a part of your care! It was a pleasure speaking with you today!  1. Take all your medications as prescribed 2. Continue to isolate yourself to separate areas of the home until your test results come back 3. Do your best to only have one caregiver in the home Cletus Gash) until be know you have a negative covid test. 4. Assess your COPD and Heart Failure symptoms daily. Drink plenty of water to remain hydrated but assess for fluid retention. 5. If you begin to have ANY respiratory difficulty call 911  CCM (Chronic Care Management) Team   Trish Fountain RN, BSN Nurse Care Coordinator  [phone]  Ruben Reason PharmD  Clinical Pharmacist  [phone]   [place], LCSW Clinical Social Worker [phone]  Goals Addressed            This Visit's Progress   . "I just feel terrible and can't do anything" (pt-stated)       Current Barriers:  . Lack of support in home  Nurse Case Manager Clinical Goal(s):  Marland Kitchen Over the next 72 hours, patient will present to ED if covid-like symptoms worsen  Interventions:  . Provided education to son Theresia Majors and patient regarding covid-19 s/s, infection prevention, and assessed for testing . Discussed importance of self isolation until test results are confirmed . Assessed for need for emergent care . Stressed importance of seeking emergency care if respiratory symptoms worsen  Patient Self Care Activities:  . Patient will follow covid 19 recovery care suggestions by remaining hydrated, taking all medications as prescribed, completing her daily HF and COPD action plan assessment, calling 911 with respiratory difficulty  Initial goal documentation        The patient verbalized understanding of instructions provided today and declined a print copy of patient instruction materials.   Telephone follow up appointment with care management team member scheduled for: 3  days  SYMPTOMS OF A STROKE   You have any symptoms of stroke. "BE FAST" is an easy way to remember the main warning signs: ? B - Balance. Signs are dizziness, sudden trouble walking, or loss of balance. ? E - Eyes. Signs are trouble seeing or a sudden change in how you see. ? F - Face. Signs are sudden weakness or loss of feeling of the face, or the face or eyelid drooping on one side. ? A - Arms. Signs are weakness or loss of feeling in an arm. This happens suddenly and usually on one side of the body. ? S - Speech. Signs are sudden trouble speaking, slurred speech, or trouble understanding what people say. ? T - Time. Time to call emergency services. Write down what time symptoms started.  You have other signs of stroke, such as: ? A sudden, very bad headache with no known cause. ? Feeling sick to your stomach (nausea). ? Throwing up (vomiting). ? Jerky movements you cannot control (seizure).  SYMPTOMS OF A HEART ATTACK  What are the signs or symptoms? Symptoms of this condition include:  Chest pain. It may feel like: ? Crushing or squeezing. ? Tightness, pressure, fullness, or heaviness.  Pain in the arm, neck, jaw, back, or upper body.  Shortness of breath.  Heartburn.  Indigestion.  Nausea.  Cold sweats.  Feeling tired.  Sudden lightheadedness.

## 2019-05-18 ENCOUNTER — Ambulatory Visit: Payer: Self-pay

## 2019-05-18 ENCOUNTER — Other Ambulatory Visit: Payer: Self-pay

## 2019-05-18 DIAGNOSIS — U071 COVID-19: Secondary | ICD-10-CM

## 2019-05-18 NOTE — Patient Instructions (Signed)
Thank you allowing the Chronic Care Management Team to be a part of your care! It was a pleasure speaking with you today!  1. Please continue to monitor your health daily. Seek medical care with any worsening respiratory symptoms, 2. Drink plenty of fluids but make sure you are monitoring your weight. 3. Please continue to isolate yourself for 14 days after your first symptoms and wear a mask when you leave your home. Notify your providers that you are positive for Covid if you have to go to the doctor 4. Continue to take all your medications as prescribed.  CCM (Chronic Care Management) Team   Yvone NeuPortia Marguerite Barba RN, BSN Nurse Care Coordinator  [phone]  Karalee HeightJulie Hedrick PharmD  Clinical Pharmacist  [phone]   Verna Czechhrystal Land, LCSW Clinical Social Worker [phone]  Goals Addressed            This Visit's Progress   . "I just feel terrible and can't do anything" (pt-stated)       Current Barriers:  . Lack of support in home  Nurse Case Manager Clinical Goal(s):  Marland Kitchen. Over the next 72 hours, patient will present to ED if covid-like symptoms worsen  Interventions:  . Provided education to daughter Tyler Pitaammie and patient regarding covid-19 s/s, infection prevention . Assessed for test results from recent screening . Discussed importance of continued self isolation until 14 days from onset of symptoms . Discussed follow up on Friday with patient and husband Banks . Stressed importance of seeking emergency care if respiratory symptoms worsen  Patient Self Care Activities:  . Patient will continue to follow covid 19 recovery care suggestions by remaining hydrated, taking all medications as prescribed, completing her daily HF and COPD action plan assessment, calling 911 with respiratory difficulty  Please see past updates related to this goal by clicking on the "Past Updates" button in the selected goal         The patient verbalized understanding of instructions provided  today and declined a print copy of patient instruction materials.   Telephone follow up appointment with care management team member scheduled for: Friday  SYMPTOMS OF A STROKE   You have any symptoms of stroke. "BE FAST" is an easy way to remember the main warning signs: ? B - Balance. Signs are dizziness, sudden trouble walking, or loss of balance. ? E - Eyes. Signs are trouble seeing or a sudden change in how you see. ? F - Face. Signs are sudden weakness or loss of feeling of the face, or the face or eyelid drooping on one side. ? A - Arms. Signs are weakness or loss of feeling in an arm. This happens suddenly and usually on one side of the body. ? S - Speech. Signs are sudden trouble speaking, slurred speech, or trouble understanding what people say. ? T - Time. Time to call emergency services. Write down what time symptoms started.  You have other signs of stroke, such as: ? A sudden, very bad headache with no known cause. ? Feeling sick to your stomach (nausea). ? Throwing up (vomiting). ? Jerky movements you cannot control (seizure).  SYMPTOMS OF A HEART ATTACK  What are the signs or symptoms? Symptoms of this condition include:  Chest pain. It may feel like: ? Crushing or squeezing. ? Tightness, pressure, fullness, or heaviness.  Pain in the arm, neck, jaw, back, or upper body.  Shortness of breath.  Heartburn.  Indigestion.  Nausea.  Cold sweats.  Feeling tired.  Sudden lightheadedness.

## 2019-05-18 NOTE — Chronic Care Management (AMB) (Signed)
°  Chronic Care Management   Follow Up Note   05/18/2019 Name: Kristen Schroeder MRN: [id number] DOB: [birth year]  Referred by: Jerrol Banana., MD Reason for referral : Chronic Care Management (follow up on covid)   Subjective:  "I tested positive and so did Kristen Schroeder when he went to the hospital with a stroke Saturday"   Objective:  Assessment: Kristen Schroeder is a 78 year old female patient of Dr. Miguel Aschoff, who is well know to the CCM Team. Kristen Schroeder has been doing very well managing her health and has been very cautious to remain at home during covid-19 pandemic. RN CM follow up with Kristen Schroeder today to discuss her acute illness and to follow up on Covid test results.   Review of patient status, including review of consultants reports, relevant laboratory and other test results, and collaboration with appropriate care team members and the patient's provider was performed as part of comprehensive patient evaluation and provision of chronic care management services.    Goals Addressed            This Visit's Progress    "I just feel terrible and can't do anything" (pt-stated)       Kristen Schroeder says she if feeling a little better today. Her energy is a little stronger and her appetite is slowly coming back. She continues to deny increased respiratory difficulty beyond her baseline. She did test positive for Covid. Her daughter is now in the home for support. All infection prevention precautions are being utilized. Sadly, Kristen Schroeder states her husband had a stroke on Saturday and was admitted to [facility] where he also tested positive for covid. RN CM spoke to patients daughter who states Kristen Schroeder may come home tomorrow. Kristen Schroeder adult handicap son of the home also had covid like symptoms but had not been tested. He is presumed positive and recovering well. Kristen Schroeder daughter will remain in the home for support for at least 14 days before she returns home to her own family and  is encouraged to be tested over the next several days.   Current Barriers:   Lack of support in home  Nurse Case Manager Clinical Goal(s):   Over the next 72 hours, patient will present to ED if covid-like symptoms worsen  Interventions:   Provided education to daughter Kristen Schroeder and patient regarding covid-19 s/s, infection prevention  Assessed for test results from recent screening  Discussed importance of continued self isolation until 14 days from onset of symptoms  Discussed follow up on Friday with patient and husband Kristen Schroeder  Stressed importance of seeking emergency care if respiratory symptoms worsen  Patient Self Care Activities:   Patient will continue to follow covid 19 recovery care suggestions by remaining hydrated, taking all medications as prescribed, completing her daily HF and COPD action plan assessment, calling 911 with respiratory difficulty  Please see past updates related to this goal by clicking on the "Past Updates" button in the selected goal         Telephone follow up appointment with care management team member scheduled for: Friday 05/22/2019   Tymara Saur E. Rollene Rotunda, RN, BSN Nurse Care Coordinator [facility] Care Management [phone]

## 2019-05-20 ENCOUNTER — Ambulatory Visit: Payer: Self-pay

## 2019-05-20 DIAGNOSIS — F419 Anxiety disorder, unspecified: Secondary | ICD-10-CM

## 2019-05-20 NOTE — Patient Instructions (Addendum)
Thank you allowing the Chronic Care Management Team to be a part of your care! It was a pleasure speaking with you today!  Please ask your daughter and/or son Cletus Gash to work with you on switching your oxygen from home concentrator to your portable E cylindar. The more you practice the more you will be comfortable when you have to do it.    CCM (Chronic Care Management) Team   Trish Fountain RN, BSN Nurse Care Coordinator  [phone]  Ruben Reason PharmD  Clinical Pharmacist  [phone]   [place], LCSW Clinical Social Worker [phone]  Goals Addressed            This Visit's Progress   . "I just feel terrible and can't do anything" (pt-stated)       Current Barriers:  . Lack of support in home  Nurse Case Manager Clinical Goal(s):  Marland Kitchen Over the next 72 hours, patient will present to ED if covid-like symptoms worsen-goal met 05/20/2019 . Over the next 30 days, patient will report resolution from s/s of covid 19 infection  Interventions:   . Assessed for improvement in Covid 19 symptoms including improvement in appetite, cough/congestion, fever, and malaise . Discussed importance of continued self isolation until 14 days from onset of symptoms . Discussed follow up on Friday with patient and husband Banks . Stressed importance of seeking emergency care if respiratory symptoms worsen  Patient Self Care Activities:  . Patient will continue to follow covid 19 recovery care suggestions by remaining hydrated, taking all medications as prescribed, completing her daily HF and COPD action plan assessment, calling 911 with respiratory difficulty  Please see past updates related to this goal by clicking on the "Past Updates" button in the selected goal      . COMPLETED: Do you think you could get me one of those portable oxygen things (pt-stated)       Current Barriers:  Marland Kitchen Knowledge Deficits related to process for obtaining a portable concentrator  Nurse Case  Manager Clinical Goal(s):  Marland Kitchen Patient will verbalize process for obtaining a portable oxygen concentrator  Interventions:  . Discussed process for obtaining a portable oxygen concentrator  . Assessed for reason patient wished to have portable concentrator . Provided emotional support and reassurance  Initial goal documentation        The patient verbalized understanding of instructions provided today and declined a print copy of patient instruction materials.   Telephone follow up appointment with care management team member scheduled for: 05/22/2019  SYMPTOMS OF A STROKE   You have any symptoms of stroke. "BE FAST" is an easy way to remember the main warning signs: ? B - Balance. Signs are dizziness, sudden trouble walking, or loss of balance. ? E - Eyes. Signs are trouble seeing or a sudden change in how you see. ? F - Face. Signs are sudden weakness or loss of feeling of the face, or the face or eyelid drooping on one side. ? A - Arms. Signs are weakness or loss of feeling in an arm. This happens suddenly and usually on one side of the body. ? S - Speech. Signs are sudden trouble speaking, slurred speech, or trouble understanding what people say. ? T - Time. Time to call emergency services. Write down what time symptoms started.  You have other signs of stroke, such as: ? A sudden, very bad headache with no known cause. ? Feeling sick to your stomach (nausea). ? Throwing up (vomiting). ?  Jerky movements you cannot control (seizure).  SYMPTOMS OF A HEART ATTACK  What are the signs or symptoms? Symptoms of this condition include:  Chest pain. It may feel like: ? Crushing or squeezing. ? Tightness, pressure, fullness, or heaviness.  Pain in the arm, neck, jaw, back, or upper body.  Shortness of breath.  Heartburn.  Indigestion.  Nausea.  Cold sweats.  Feeling tired.  Sudden lightheadedness.

## 2019-05-20 NOTE — Chronic Care Management (AMB) (Signed)
Chronic Care Management   Follow Up Note   05/20/2019 Name: Kristen Schroeder MRN: [id number] DOB: Feb 03, 1941  Referred by: Kristen Schroeder., MD Reason for referral : Chronic Care Management (incoming call)   Subjective: "Kristen Schroeder is home but he is not the same. I am afraid he will not be able to help me with my oxygen"   Objective:  Assessment: [name] is a 78 year old female patient of Dr. Miguel Schroeder, who is well know to the CCM Team. [name] has been doing very well managing her health and has been very cautious to remain at home during covid-19 pandemic. She tested positive for the virus and CCM RN CM has been following her recovery closely. Today [name] called RN CM to see if CM could approve [name] receipt of portable oxygen concentrator.  Review of patient status, including review of consultants reports, relevant laboratory and other test results, and collaboration with appropriate care team members and the patient's provider was performed as part of comprehensive patient evaluation and provision of chronic care management services.    Goals Addressed            This Visit's Progress   . "I just feel terrible and can't do anything" (pt-stated)       [name] reports she if beginning to feel better. Her appitite is improving and her energy is beginning to return. She no longer has a cough.  Current Barriers:  . Lack of support in home  Nurse Case Manager Clinical Goal(s):  Marland Kitchen Over the next 72 hours, patient will present to ED if covid-like symptoms worsen-goal met 05/20/2019 . Over the next 30 days, patient will report resolution from s/s of covid 19 infection  Interventions:   . Assessed for improvement in Covid 19 symptoms including improvement in appetite, cough/congestion, fever, and malaise . Discussed importance of continued self isolation until 14 days from onset of symptoms . Discussed follow up on Friday with patient and husband Kristen Schroeder .  Stressed importance of seeking emergency care if respiratory symptoms worsen  Patient Self Care Activities:  . Patient will continue to follow covid 19 recovery care suggestions by remaining hydrated, taking all medications as prescribed, completing her daily HF and COPD action plan assessment, calling 911 with respiratory difficulty  Please see past updates related to this goal by clicking on the "Past Updates" button in the selected goal      . COMPLETED: Do you think you could get me one of those portable oxygen things (pt-stated)        [name] is feeling better. Her appetite is improving and her cough is better. [name] is anxious and concerned about her power going out and not having oxygen. [name] has always managed [name]'s oxygen when switching from home concentrator to portable tank. [name] has been discharged from hospital yesterday with stroke and [name] is fearful that he will no longer be able to switch her from her concentrator to a tank for travel or if her power goes out. She request a battery operated [employer] as backup as she feels switching from home concentrator to portable would be easier for her than from home concentrator to tank.   [name] has been told by her pulmonologist that she would have to present to the office to complete a 6 min walk test to see if she would qualify for a portable. She is currently on 4.5 liters continuous  flow and the portables that would give her that much flow are heavy and a pull behind. The portable that she request only goes to 2 liters continuous which would not provide her with the oxygen she needs at rest and with exertion. After explanation, [name] verbalizes understanding.  [name] is currently being assisted by her daughter and son. Will discuss with daughter plan for continued care for both Mr. And Ms Kristen Schroeder. Appointment previously scheduled for Friday 05/22/2019.  Current Barriers:  Marland Kitchen Knowledge Deficits  related to process for obtaining a portable concentrator  Nurse Case Manager Clinical Goal(s):  Marland Kitchen Patient will verbalize process for obtaining a portable oxygen concentrator  Interventions:  . Discussed process for obtaining a portable oxygen concentrator  . Assessed for reason patient wished to have portable concentrator . Provided emotional support and reassurance  Initial goal documentation         Telephone follow up appointment with care management team member scheduled for: 05/22/2019 as previously scheduled   Kristen Schroeder E. Rollene Rotunda, RN, BSN Nurse Care Coordinator [facility] Care Management [phone]

## 2019-05-22 ENCOUNTER — Ambulatory Visit: Payer: Self-pay

## 2019-05-22 ENCOUNTER — Other Ambulatory Visit: Payer: Self-pay

## 2019-05-22 DIAGNOSIS — J42 Unspecified chronic bronchitis: Secondary | ICD-10-CM

## 2019-05-22 DIAGNOSIS — U071 COVID-19: Secondary | ICD-10-CM

## 2019-05-22 DIAGNOSIS — I509 Heart failure, unspecified: Secondary | ICD-10-CM

## 2019-05-22 NOTE — Chronic Care Management (AMB) (Signed)
  Chronic Care Management   Follow Up Note   05/22/2019 Name: Kristen Schroeder MRN: [id number] DOB: [birth year]  Referred by: Jerrol Banana., MD Reason for referral : Chronic Care Management (follow up covid)   Subjective: "I am beginning to feel like myself"   Objective:  Assessment: [name] is a 78 year old female patient of Dr. Miguel Aschoff, who is well know to the CCM Team. Kristen Schroeder has been doing very well managing her health and has been very cautious to remain at home during covid-19 pandemic. Kristen Schroeder, her husband Kristen Schroeder, and her disabled son of the home all contracted the covid virus. CCM RN CM has followed closely.  Review of patient status, including review of consultants reports, relevant laboratory and other test results, and collaboration with appropriate care team members and the patient's provider was performed as part of comprehensive patient evaluation and provision of chronic care management services.    Goals Addressed            This Visit's Progress   . "I just feel terrible and can't do anything" (pt-stated)       Kristen Schroeder states she is beginning to feel like herself again. Her appetite has picked up as well as her energy. Her daughter Kristen Schroeder remains in the home to ensure Kristen Schroeder and Kristen Schroeder recover (Kristen Schroeder suffered recent stroke and required hospitalization-discharge this past Tuesday). Kristen Schroeder denies any ongoing respiratory difficulty beyond her baseline. Her cough has resolved.  Current Barriers:  . Lack of support in home  Nurse Case Manager Clinical Goal(s):  Marland Kitchen Over the next 30 days, patient will report resolution from s/s of covid 19 infection  Interventions:   . Assessed for improvement in Covid 19 symptoms including improvement in appetite, cough/congestion, fever, and malaise . Discussed importance of continued self isolation until 14 days from onset of symptoms  Patient Self Care Activities:  . Patient will  continue to follow covid 19 recovery care suggestions by remaining hydrated, taking all medications as prescribed, completing her daily HF and COPD action plan assessment, calling 911 with respiratory difficulty  Please see past updates related to this goal by clicking on the "Past Updates" button in the selected goal          Telephone follow up appointment with care management team member scheduled for: 1 week   Kristen Schroeder E. Rollene Rotunda, RN, BSN Nurse Care Coordinator [facility] Care Management [phone]

## 2019-05-25 NOTE — Patient Instructions (Signed)
  Thank you allowing the Chronic Care Management Team to be a part of your care! It was a pleasure speaking with you today!  1. Continue to take your medications as prescribed, follow your chronic bronchitis action plan (COPD) and Heart Failure action plan.  CCM (Chronic Care Management) Team   Trish Fountain RN, BSN Nurse Care Coordinator  [phone]  Ruben Reason PharmD  Clinical Pharmacist  [phone]   Elliot Gurney, LCSW Clinical Social Worker [phone]  Goals Addressed            This Visit's Progress   . "I just feel terrible and can't do anything" (pt-stated)       Current Barriers:  . Lack of support in home  Nurse Case Manager Clinical Goal(s):  Marland Kitchen Over the next 30 days, patient will report resolution from s/s of covid 19 infection  Interventions:   . Assessed for improvement in Covid 19 symptoms including improvement in appetite, cough/congestion, fever, and malaise . Discussed importance of continued self isolation until 14 days from onset of symptoms  Patient Self Care Activities:  . Patient will continue to follow covid 19 recovery care suggestions by remaining hydrated, taking all medications as prescribed, completing her daily HF and COPD action plan assessment, calling 911 with respiratory difficulty  Please see past updates related to this goal by clicking on the "Past Updates" button in the selected goal         The patient verbalized understanding of instructions provided today and declined a print copy of patient instruction materials.   Telephone follow up appointment with care management team member scheduled for: 1 week  SYMPTOMS OF A STROKE   You have any symptoms of stroke. "BE FAST" is an easy way to remember the main warning signs: ? B - Balance. Signs are dizziness, sudden trouble walking, or loss of balance. ? E - Eyes. Signs are trouble seeing or a sudden change in how you see. ? F - Face. Signs are sudden weakness or  loss of feeling of the face, or the face or eyelid drooping on one side. ? A - Arms. Signs are weakness or loss of feeling in an arm. This happens suddenly and usually on one side of the body. ? S - Speech. Signs are sudden trouble speaking, slurred speech, or trouble understanding what people say. ? T - Time. Time to call emergency services. Write down what time symptoms started.  You have other signs of stroke, such as: ? A sudden, very bad headache with no known cause. ? Feeling sick to your stomach (nausea). ? Throwing up (vomiting). ? Jerky movements you cannot control (seizure).  SYMPTOMS OF A HEART ATTACK  What are the signs or symptoms? Symptoms of this condition include:  Chest pain. It may feel like: ? Crushing or squeezing. ? Tightness, pressure, fullness, or heaviness.  Pain in the arm, neck, jaw, back, or upper body.  Shortness of breath.  Heartburn.  Indigestion.  Nausea.  Cold sweats.  Feeling tired.  Sudden lightheadedness.

## 2019-05-26 ENCOUNTER — Other Ambulatory Visit: Payer: Self-pay

## 2019-05-26 ENCOUNTER — Ambulatory Visit: Payer: Self-pay

## 2019-05-26 DIAGNOSIS — U071 COVID-19: Secondary | ICD-10-CM

## 2019-05-26 NOTE — Patient Instructions (Signed)
  Thank you allowing the Chronic Care Management Team to be a part of your care! It was a pleasure speaking with you today!  1. You are getting stronger every day. Continue to rest when needed and remain active as tolerated.  CCM (Chronic Care Management) Team   Trish Fountain RN, BSN Nurse Care Coordinator  [phone]  Ruben Reason PharmD  Clinical Pharmacist  [phone]   Elliot Gurney, LCSW Clinical Social Worker [phone]  Goals Addressed            This Visit's Progress   . "I just feel terrible and can't do anything" (pt-stated)       Current Barriers:  . Lack of support in home  Nurse Case Manager Clinical Goal(s):  Marland Kitchen Over the next 30 days, patient will report resolution from s/s of covid 19 infection  Interventions:   . Assessed for improvement in Covid 19 symptoms including improvement in appetite, cough/congestion, fever, and malaise . Discussed importance of continued self isolation until 14 days from onset of symptoms . Discussed with patient and daughter Lynelle Smoke importance of continued infection prevention as there have been cases where people have contracted the virus again.  Patient Self Care Activities:  . Patient will continue to follow covid 19 recovery care suggestions by remaining hydrated, taking all medications as prescribed, completing her daily HF and COPD action plan assessment, calling 911 with respiratory difficulty  Please see past updates related to this goal by clicking on the "Past Updates" button in the selected goal         The patient verbalized understanding of instructions provided today and declined a print copy of patient instruction materials.   Telephone follow up appointment with care management team member scheduled for: 2 weeks  SYMPTOMS OF A STROKE   You have any symptoms of stroke. "BE FAST" is an easy way to remember the main warning signs: ? B - Balance. Signs are dizziness, sudden trouble walking, or loss  of balance. ? E - Eyes. Signs are trouble seeing or a sudden change in how you see. ? F - Face. Signs are sudden weakness or loss of feeling of the face, or the face or eyelid drooping on one side. ? A - Arms. Signs are weakness or loss of feeling in an arm. This happens suddenly and usually on one side of the body. ? S - Speech. Signs are sudden trouble speaking, slurred speech, or trouble understanding what people say. ? T - Time. Time to call emergency services. Write down what time symptoms started.  You have other signs of stroke, such as: ? A sudden, very bad headache with no known cause. ? Feeling sick to your stomach (nausea). ? Throwing up (vomiting). ? Jerky movements you cannot control (seizure).  SYMPTOMS OF A HEART ATTACK  What are the signs or symptoms? Symptoms of this condition include:  Chest pain. It may feel like: ? Crushing or squeezing. ? Tightness, pressure, fullness, or heaviness.  Pain in the arm, neck, jaw, back, or upper body.  Shortness of breath.  Heartburn.  Indigestion.  Nausea.  Cold sweats.  Feeling tired.  Sudden lightheadedness.

## 2019-05-26 NOTE — Chronic Care Management (AMB) (Signed)
  Chronic Care Management   Follow Up Note   05/26/2019 Name: Kristen Schroeder MRN: [id number] DOB: [birth year]  Referred by: Jerrol Banana., MD Reason for referral : Chronic Care Management (follow up covid)   Subjective:    Objective:  Assessment:  [name] is a 78 year old female patient of Dr. Miguel Aschoff, who is well know to the CCM Team. [name] has been doing very well managing her health and has been very cautious to remain at home during covid-19 pandemic. Ms. Trick, her husband Kristen Schroeder, and her disabled son of the home all contracted the covid virus. CCM RN CM has followed closely.   Review of patient status, including review of consultants reports, relevant laboratory and other test results, and collaboration with appropriate care team members and the patient's provider was performed as part of comprehensive patient evaluation and provision of chronic care management services.    Goals Addressed            This Visit's Progress   . "I just feel terrible and can't do anything" (pt-stated)       Ms. Felker states she is feeling much better and "almost back to my normal self". Her appitite has returned to baseline and her symptoms have resolved. She does discribed a decrease in her energy level. She is able to perform a ADLs. Daughter Kristen Schroeder will remain in the home for a while longer to make sure patient and husband are fully recovered and able to care for themselves.    Current Barriers:  . Lack of support in home  Nurse Case Manager Clinical Goal(s):  Marland Kitchen Over the next 30 days, patient will report resolution from s/s of covid 19 infection  Interventions:   . Assessed for improvement in Covid 19 symptoms including improvement in appetite, cough/congestion, fever, and malaise . Discussed importance of continued self isolation until 14 days from onset of symptoms . Discussed with patient and daughter Kristen Schroeder importance of continued infection  prevention as there have been cases where people have contracted the virus again.  Patient Self Care Activities:  . Patient will continue to follow covid 19 recovery care suggestions by remaining hydrated, taking all medications as prescribed, completing her daily HF and COPD action plan assessment, calling 911 with respiratory difficulty  Please see past updates related to this goal by clicking on the "Past Updates" button in the selected goal          Telephone follow up appointment with care management team member scheduled for: 2 weeks   Kamaury Cutbirth E. Rollene Rotunda, RN, BSN Nurse Care Coordinator [facility] Care Management [phone]

## 2019-05-29 ENCOUNTER — Telehealth: Payer: Self-pay | Admitting: Family Medicine

## 2019-05-29 NOTE — Telephone Encounter (Signed)
Pt stated her daughter dropped forms off on 05/27/2019 to be completed for daughter missing work due to caring for pt. Pt is requesting status update. Pt was advised that forms can take 7 to 14 business day. Pt stated her daughter needs them asap. Please advise. Thanks TNP

## 2019-06-01 ENCOUNTER — Telehealth: Payer: Self-pay | Admitting: Family Medicine

## 2019-06-01 NOTE — Telephone Encounter (Signed)
Placed in your stack to review. Thanks!  

## 2019-06-01 NOTE — Telephone Encounter (Signed)
Pt called requesting the forms be completed asap because her daughter needs them for work. Please advise. Thanks TNP

## 2019-06-01 NOTE — Telephone Encounter (Signed)
Pt called wanting a nurse to call her back.  She would not disclose the question.  Pt's CB# is [phone]  Thanks  Con Memos

## 2019-06-01 NOTE — Telephone Encounter (Signed)
Please review

## 2019-06-01 NOTE — Telephone Encounter (Signed)
Called patient back regarding her message. She states that she already spoke to someone.

## 2019-06-02 DIAGNOSIS — J449 Chronic obstructive pulmonary disease, unspecified: Secondary | ICD-10-CM | POA: Diagnosis not present

## 2019-06-11 ENCOUNTER — Other Ambulatory Visit: Payer: Self-pay

## 2019-06-11 ENCOUNTER — Ambulatory Visit (INDEPENDENT_AMBULATORY_CARE_PROVIDER_SITE_OTHER): Payer: Medicare Other

## 2019-06-11 DIAGNOSIS — I509 Heart failure, unspecified: Secondary | ICD-10-CM | POA: Diagnosis not present

## 2019-06-11 DIAGNOSIS — U071 COVID-19: Secondary | ICD-10-CM

## 2019-06-11 DIAGNOSIS — J42 Unspecified chronic bronchitis: Secondary | ICD-10-CM

## 2019-06-11 NOTE — Chronic Care Management (AMB) (Signed)
  Chronic Care Management   Follow Up Note   06/11/2019 Name: Kristen Schroeder MRN: [id number] DOB: [birth year]  Referred by: Jerrol Banana., MD Reason for referral : Chronic Care Management (follow up covid)   Subjective: "I am doing much better and I am back to feeling like myself"   Objective:  Lab Results  Component Value Date   HGBA1C 5.3 03/09/2019   BP Readings from Last 3 Encounters:  03/19/19 122/65  03/09/19 126/84  12/08/18 (!) 128/54   Lab Results  Component Value Date   CHOL 173 12/08/2018   HDL 59 12/08/2018   LDLCALC 92 12/08/2018   TRIG 110 12/08/2018   CHOLHDL 2.9 12/08/2018    Assessment: [name] is a 78 year old female patient of Dr. Miguel Aschoff, who is well know to the CCM Team. [name] has been doing very well managing her health and has been very cautious to remain at home during covid-19 pandemic.[name], her husband Ariba Lehnen, and her disabled son of the home all contracted the covid virus. CCM RN CM has followed closely.   Review of patient status, including review of consultants reports, relevant laboratory and other test results, and collaboration with appropriate care team members and the patient's provider was performed as part of comprehensive patient evaluation and provision of chronic care management services.    Goals Addressed            This Visit's Progress   . COMPLETED: "I just feel terrible and can't do anything" (pt-stated)       [name] says she is doing well. Back to baseline. She has no residual symptoms of Covid-19 virus. She continues to take all medications as prescribed and monitors her BP and her blood sugars. She continues to use oxygen as prescribed and weighs herself daily. She understands when to notify her provider/cardiologist/pulmonologist for weight gain and/or signs and symptoms of fluid overload.  Current Barriers:  . Lack of support in home  Nurse Case Manager Clinical Goal(s):  Marland Kitchen  Over the next 30 days, patient will report resolution from s/s of covid 19 infection  Interventions:   . Assessed for improvement in Covid 19 symptoms including improvement in appetite, cough/congestion, fever, and malaise . Discussed importance of continued self isolation until 14 days from onset of symptoms . Discussed with patient and daughter Lynelle Smoke importance of continued infection prevention as there have been cases where people have contracted the virus again.  Patient Self Care Activities:  . Patient will continue to follow covid 19 recovery care suggestions by remaining hydrated, taking all medications as prescribed, completing her daily HF and COPD action plan assessment, calling 911 with respiratory difficulty  Please see past updates related to this goal by clicking on the "Past Updates" button in the selected goal          The patient has been provided with contact information for the care management team and has been advised to call with any health related questions or concerns.    Alfhild Partch E. Rollene Rotunda, RN, BSN Nurse Care Coordinator [facility] Care Management [phone]  up

## 2019-06-11 NOTE — Patient Instructions (Addendum)
  Thank you allowing the Chronic Care Management Team to be a part of your care! It was a pleasure speaking with you today!  1. Continue to take your medications as prescribed, follow your chronic bronchitis action plan (COPD) and Heart Failure action plan.  CCM (Chronic Care Management) Team   Trish Fountain RN, BSN Nurse Care Coordinator  [phone]  Ruben Reason PharmD  Clinical Pharmacist  [phone]   Elliot Gurney, LCSW Clinical Social Worker [phone]  Goals Addressed            This Visit's Progress   . COMPLETED: "I just feel terrible and can't do anything" (pt-stated)       Current Barriers:  . Lack of support in home  Nurse Case Manager Clinical Goal(s):  Marland Kitchen Over the next 30 days, patient will report resolution from s/s of covid 19 infection  Interventions:   . Assessed for improvement in Covid 19 symptoms including improvement in appetite, cough/congestion, fever, and malaise . Discussed importance of continued self isolation until 14 days from onset of symptoms . Discussed with patient and daughter Kristen Schroeder importance of continued infection prevention as there have been cases where people have contracted the virus again.  Patient Self Care Activities:  . Patient will continue to follow covid 19 recovery care suggestions by remaining hydrated, taking all medications as prescribed, completing her daily HF and COPD action plan assessment, calling 911 with respiratory difficulty  Please see past updates related to this goal by clicking on the "Past Updates" button in the selected goal         The patient verbalized understanding of instructions provided today and declined a print copy of patient instruction materials.   The patient has been provided with contact information for the care management team and has been advised to call with any health related questions or concerns.   SYMPTOMS OF A STROKE   You have any symptoms of stroke. "BE FAST" is  an easy way to remember the main warning signs: ? B - Balance. Signs are dizziness, sudden trouble walking, or loss of balance. ? E - Eyes. Signs are trouble seeing or a sudden change in how you see. ? F - Face. Signs are sudden weakness or loss of feeling of the face, or the face or eyelid drooping on one side. ? A - Arms. Signs are weakness or loss of feeling in an arm. This happens suddenly and usually on one side of the body. ? S - Speech. Signs are sudden trouble speaking, slurred speech, or trouble understanding what people say. ? T - Time. Time to call emergency services. Write down what time symptoms started.  You have other signs of stroke, such as: ? A sudden, very bad headache with no known cause. ? Feeling sick to your stomach (nausea). ? Throwing up (vomiting). ? Jerky movements you cannot control (seizure).  SYMPTOMS OF A HEART ATTACK  What are the signs or symptoms? Symptoms of this condition include:  Chest pain. It may feel like: ? Crushing or squeezing. ? Tightness, pressure, fullness, or heaviness.  Pain in the arm, neck, jaw, back, or upper body.  Shortness of breath.  Heartburn.  Indigestion.  Nausea.  Cold sweats.  Feeling tired.  Sudden lightheadedness.

## 2019-06-14 ENCOUNTER — Encounter: Payer: Self-pay | Admitting: Emergency Medicine

## 2019-06-14 ENCOUNTER — Emergency Department: Payer: Medicare Other

## 2019-06-14 ENCOUNTER — Emergency Department
Admission: EM | Admit: 2019-06-14 | Discharge: 2019-06-14 | Disposition: A | Discharge status: Home / Self Care | Payer: Medicare Other | Attending: Emergency Medicine | Admitting: Emergency Medicine

## 2019-06-14 DIAGNOSIS — J189 Pneumonia, unspecified organism: Secondary | ICD-10-CM | POA: Insufficient documentation

## 2019-06-14 DIAGNOSIS — Z79899 Other long term (current) drug therapy: Secondary | ICD-10-CM | POA: Insufficient documentation

## 2019-06-14 DIAGNOSIS — J449 Chronic obstructive pulmonary disease, unspecified: Secondary | ICD-10-CM | POA: Diagnosis not present

## 2019-06-14 DIAGNOSIS — R42 Dizziness and giddiness: Secondary | ICD-10-CM | POA: Diagnosis not present

## 2019-06-14 DIAGNOSIS — R531 Weakness: Secondary | ICD-10-CM

## 2019-06-14 DIAGNOSIS — Z7982 Long term (current) use of aspirin: Secondary | ICD-10-CM | POA: Insufficient documentation

## 2019-06-14 DIAGNOSIS — E119 Type 2 diabetes mellitus without complications: Secondary | ICD-10-CM | POA: Insufficient documentation

## 2019-06-14 DIAGNOSIS — Z87891 Personal history of nicotine dependence: Secondary | ICD-10-CM | POA: Insufficient documentation

## 2019-06-14 DIAGNOSIS — I10 Essential (primary) hypertension: Secondary | ICD-10-CM | POA: Insufficient documentation

## 2019-06-14 DIAGNOSIS — E039 Hypothyroidism, unspecified: Secondary | ICD-10-CM | POA: Diagnosis not present

## 2019-06-14 DIAGNOSIS — Z7984 Long term (current) use of oral hypoglycemic drugs: Secondary | ICD-10-CM | POA: Insufficient documentation

## 2019-06-14 DIAGNOSIS — R000 Tachycardia, unspecified: Secondary | ICD-10-CM | POA: Diagnosis not present

## 2019-06-14 DIAGNOSIS — I491 Atrial premature depolarization: Secondary | ICD-10-CM | POA: Diagnosis not present

## 2019-06-14 DIAGNOSIS — U071 COVID-19: Secondary | ICD-10-CM | POA: Diagnosis not present

## 2019-06-14 LAB — CBC WITH DIFFERENTIAL/PLATELET
Abs Immature Granulocytes: 0.16 10*3/uL — ABNORMAL HIGH (ref 0.00–0.07)
Basophils Absolute: 0 10*3/uL (ref 0.0–0.1)
Basophils Relative: 0 %
Eosinophils Absolute: 0.2 10*3/uL (ref 0.0–0.5)
Eosinophils Relative: 3 %
HCT: 29.3 % — ABNORMAL LOW (ref 36.0–46.0)
Hemoglobin: 8.1 g/dL — ABNORMAL LOW (ref 12.0–15.0)
Immature Granulocytes: 2 %
Lymphocytes Relative: 12 %
Lymphs Abs: 1.1 10*3/uL (ref 0.7–4.0)
MCH: 28.3 pg (ref 26.0–34.0)
MCHC: 27.6 g/dL — ABNORMAL LOW (ref 30.0–36.0)
MCV: 102.4 fL — ABNORMAL HIGH (ref 80.0–100.0)
Monocytes Absolute: 0.9 10*3/uL (ref 0.1–1.0)
Monocytes Relative: 11 %
Neutro Abs: 6.2 10*3/uL (ref 1.7–7.7)
Neutrophils Relative %: 72 %
Platelets: 187 10*3/uL (ref 150–400)
RBC: 2.86 MIL/uL — ABNORMAL LOW (ref 3.87–5.11)
RDW: 14.7 % (ref 11.5–15.5)
WBC: 8.6 10*3/uL (ref 4.0–10.5)
nRBC: 0 % (ref 0.0–0.2)

## 2019-06-14 LAB — COMPREHENSIVE METABOLIC PANEL
ALT: 7 U/L (ref 0–44)
AST: 14 U/L — ABNORMAL LOW (ref 15–41)
Albumin: 3.3 g/dL — ABNORMAL LOW (ref 3.5–5.0)
Alkaline Phosphatase: 60 U/L (ref 38–126)
Anion gap: 8 (ref 5–15)
BUN: 12 mg/dL (ref 8–23)
CO2: 37 mmol/L — ABNORMAL HIGH (ref 22–32)
Calcium: 8.4 mg/dL — ABNORMAL LOW (ref 8.9–10.3)
Chloride: 95 mmol/L — ABNORMAL LOW (ref 98–111)
Creatinine, Ser: 0.39 mg/dL — ABNORMAL LOW (ref 0.44–1.00)
GFR calc Af Amer: >60 mL/min (ref >60)
GFR calc non Af Amer: >60 mL/min (ref >60)
Glucose, Bld: 119 mg/dL — ABNORMAL HIGH (ref 70–99)
Potassium: 4.5 mmol/L (ref 3.5–5.1)
Sodium: 140 mmol/L (ref 135–145)
Total Bilirubin: 0.5 mg/dL (ref 0.3–1.2)
Total Protein: 6.7 g/dL (ref 6.5–8.1)

## 2019-06-14 LAB — URINALYSIS, COMPLETE (UACMP) WITH MICROSCOPIC
Bacteria, UA: NONE SEEN
Bilirubin Urine: NEGATIVE
Glucose, UA: NEGATIVE mg/dL
Hgb urine dipstick: NEGATIVE
Ketones, ur: NEGATIVE mg/dL
Leukocytes,Ua: NEGATIVE
Nitrite: NEGATIVE
Protein, ur: NEGATIVE mg/dL
Specific Gravity, Urine: 1.006 (ref 1.005–1.030)
pH: 6 (ref 5.0–8.0)

## 2019-06-14 LAB — TROPONIN I (HIGH SENSITIVITY): Troponin I (High Sensitivity): 7 ng/L (ref <18)

## 2019-06-14 MED ORDER — SODIUM CHLORIDE 0.9 % IV BOLUS
500.0000 mL | Freq: Once | INTRAVENOUS | Status: AC
  Administered 2019-06-14: 500 mL via INTRAVENOUS

## 2019-06-14 MED ORDER — AZITHROMYCIN 250 MG PO TABS: ORAL_TABLET | ORAL | 0 refills | Status: DC

## 2019-06-14 NOTE — ED Notes (Signed)
Pt and Pt's husband verbalized understanding of discharge instructions. NAD at this time. 

## 2019-06-14 NOTE — ED Provider Notes (Signed)
[facility] Emergency Department Provider Note ____________________________________________   None    (approximate)  I have reviewed the triage vital signs and the nursing notes.   HISTORY  Chief Complaint Weakness    HPI Kristen Schroeder is a 78 y.o. female with PMH as noted below and status post recent infection with COVID-19 who presents with generalized weakness since she woke this morning, described as a feeling of being shaky.  She denies any focal symptoms.  She has no fever, respiratory symptoms, chest pain, vomiting, diarrhea, or urinary symptoms.  She states that she was feeling at her baseline yesterday.  She denies any unusual foods recently, and has had no recent medication changes.  Past Medical History:  Diagnosis Date  . Arthritis   . COPD (chronic obstructive pulmonary disease) ([place])   . Diabetes mellitus without complication ([place])   . Dysrhythmia   . Heart murmur   . History of orthopnea   . Hypertension   . Hypothyroidism   . Neuropathy   . Oxygen dependent    3L  CONTINUOUS  . Pain CHRONIC BACK PAIN  . Shortness of breath dyspnea   . Wheezing     Patient Active Problem List   Diagnosis Date Noted  . Incarcerated hernia of abdominal cavity 08/04/2018  . SBO (small bowel obstruction) ([place]) 04/22/2018  . Incarcerated hernia 04/14/2018  . Aneurysm ([place]) 07/10/2016  . Nonspecific abnormal finding 05/11/2015  . Allergic rhinitis 05/11/2015  . Anxiety 05/11/2015  . Bronchitis, chronic ([place]) 05/11/2015  . CCF (congestive cardiac failure) ([place]) 05/11/2015  . Clinical depression 05/11/2015  . 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 Essential (primary) hypertension 05/11/2015  . Acid reflux 05/11/2015  . HLD (hyperlipidemia) 05/11/2015  . Adult hypothyroidism 05/11/2015  . Cervical dysplasia, mild 05/11/2015  . Neuropathy 05/11/2015  . Adiposity 05/11/2015  . Obstructive apnea 05/11/2015  . Arthritis,  degenerative 05/11/2015    Past Surgical History:  Procedure Laterality Date  . ABDOMINAL HYSTERECTOMY    . BOWEL RESECTION N/A 04/14/2018   Procedure: SMALL BOWEL RESECTION;  Surgeon: Jules Husbands, MD;  Location: [facility] ORS;  Service: General;  Laterality: N/A;  . BREAST BIOPSY Right    benign  . CATARACT EXTRACTION W/PHACO Left 07/03/2016   Procedure: CATARACT EXTRACTION PHACO AND INTRAOCULAR LENS PLACEMENT (IOC);  Surgeon: Birder Robson, MD;  Location: [facility] ORS;  Service: Ophthalmology;  Laterality: Left;  Lot: [id number] H [place]: 00:40.1 AP%: 17.4 CDE:6.94  . HERNIA REPAIR    . LAPAROTOMY N/A 09/04/2018   Procedure: EXPLORATORY LAPAROTOMY;  Surgeon: Olean Ree, MD;  Location: [facility] ORS;  Service: General;  Laterality: N/A;  . TUBAL LIGATION    . VENTRAL HERNIA REPAIR N/A 04/14/2018   Procedure: HERNIA REPAIR VENTRAL ADULT;  Surgeon: Jules Husbands, MD;  Location: [facility] ORS;  Service: General;  Laterality: N/A;    Prior to Admission medications   Medication Sig Start Date End Date Taking? Authorizing Provider  albuterol (PROVENTIL HFA;VENTOLIN HFA) 108 (90 Base) MCG/ACT inhaler Inhale 2 puff every 4-6 hrs as needed for SOB 01/09/19   Scarboro, Audie Clear, NP  arformoterol (BROVANA) 15 MCG/2ML NEBU Take 2 mLs (15 mcg total) by nebulization 2 (two) times daily. 03/04/18   Jerrol Banana., MD  aspirin EC 81 MG tablet Take 81 mg by mouth daily.    [provider]  azithromycin (ZITHROMAX Z-PAK) 250 MG tablet 2 tabs PO on day 1, then 1 tab PO daily for 4  days 06/14/19   Arta Silence, MD  benzonatate (TESSALON) 200 MG capsule Take 1 capsule (200 mg total) by mouth 3 (three) times daily as needed. 05/14/19   Mar Daring, PA-C  blood glucose meter kit and supplies KIT Dispense based on patient and insurance preference. Use up to four times daily as directed. (FOR ICD-9 250.00, 250.01). 06/27/18   Jerrol Banana., MD  Blood Glucose Monitoring Suppl (ONE TOUCH ULTRA 2)  w/Device KIT 1 each by Does not apply route daily. 08/27/18   Jerrol Banana., MD  enalapril (VASOTEC) 20 MG tablet Take 1 tablet (20 mg total) by mouth daily. 02/26/19   Jerrol Banana., MD  fluticasone [facility]) 50 MCG/ACT nasal spray Place 1 spray into both nostrils daily as needed.     [provider]  furosemide (LASIX) 20 MG tablet TAKE 1 TABLET BY MOUTH DAILY 04/03/19   Birdie Sons, MD  gabapentin (NEURONTIN) 300 MG capsule 2 tablets in the morning, 1 tablet in the afternoon and 2 tablets in the evening 05/21/18   Jerrol Banana., MD  glucose blood (ONE TOUCH ULTRA TEST) test strip USE 1 STRIP 2 TIMES DAILY AND AS NEEDED 07/10/18   Jerrol Banana., MD  HYDROcodone-acetaminophen [facility]) 10-325 MG tablet Take by mouth. 04/22/17   [provider]  ipratropium-albuterol (DUONEB) 0.5-2.5 (3) MG/3ML SOLN TAKE 3 MLS BY NEBULIZATION EVERY 6 (SIX) HOURS AS NEEDED. 05/15/19   Kendell Bane, NP  isosorbide mononitrate (IMDUR) 30 MG 24 hr tablet Take 30 mg by mouth daily as needed (high bp).    [provider]  levothyroxine (SYNTHROID, LEVOTHROID) 125 MCG tablet TAKE 1 TABLET BY MOUTH DAILY 08/05/18   Jerrol Banana., MD  loratadine (CLARITIN) 10 MG tablet TAKE 1 TABLET (10 MG TOTAL) BY MOUTH DAILY. 05/06/18   Jerrol Banana., MD  LORazepam (ATIVAN) 0.5 MG tablet Take 1 tablet (0.5 mg total) by mouth 2 (two) times daily as needed for anxiety. 05/20/18   Jerrol Banana., MD  metFORMIN (GLUCOPHAGE) 1000 MG tablet TAKE 1 TABLET BY MOUTH TWICE A DAY Patient taking differently: Take 1,000 mg by mouth daily with breakfast.  08/05/18   Jerrol Banana., MD  mometasone-formoterol [facility]) 100-5 MCG/ACT AERO Inhale 2 puffs into the lungs 2 (two) times daily. 03/20/18   Lavera Guise, MD  MULTIPLE VITAMIN PO Take 1 tablet by mouth daily.     [provider]  omeprazole (PRILOSEC) 40 MG capsule Take 1 capsule (40 mg total) by  mouth daily. 02/13/17   Jerrol Banana., MD  ondansetron (ZOFRAN ODT) 4 MG disintegrating tablet Take 1 tablet (4 mg total) by mouth every 8 (eight) hours as needed for nausea or vomiting. 03/19/19   Kendell Bane, NP  ondansetron (ZOFRAN-ODT) 4 MG disintegrating tablet TAKE 1 TABLET BY MOUTH EVERY 8 HOURS AS NEEDED FOR NAUSEA AND VOMITING 03/19/19   Jerrol Banana., MD  oxyCODONE-acetaminophen (PERCOCET/ROXICET) 5-325 MG tablet Take 1 tablet by mouth every 6 (six) hours as needed for severe pain. 09/11/18   Tylene Fantasia, PA-C  OXYGEN Inhale 4 L into the lungs.    [provider]  PARoxetine (PAXIL) 10 MG tablet Take 1 tablet (10 mg total) by mouth at bedtime. 05/20/18   Jerrol Banana., MD  pravastatin (PRAVACHOL) 10 MG tablet TAKE 1 TABLET (10 MG TOTAL) BY MOUTH DAILY. 06/04/18  Jerrol Banana., MD  predniSONE (STERAPRED UNI-PAK 21 TAB) 10 MG (21) TBPK tablet Taper as directed. 05/11/19   Jerrol Banana., MD  roflumilast (DALIRESP) 500 MCG TABS tablet Take by mouth.    [provider]  tiotropium (SPIRIVA) 18 MCG inhalation capsule Place into inhaler and inhale. 09/06/14   [provider]    Allergies Codeine  Family History  Problem Relation Age of Onset  . Heart disease Mother   . Drug abuse Other   . Hypertension Other     Social History Social History   Tobacco Use  . Smoking status: Former Smoker    Packs/day: 0.50    Years: 15.00    Pack years: 7.50  . Smokeless tobacco: Never Used  . Tobacco comment: 30-40 years ago  Substance Use Topics  . Alcohol use: No    Alcohol/week: 0.0 standard drinks  . Drug use: No    Review of Systems  Constitutional: No fever. Eyes: No redness. ENT: No sore throat. Cardiovascular: Denies chest pain. Respiratory: Denies shortness of breath. Gastrointestinal: No nausea, no vomiting.  No diarrhea.  Genitourinary: Negative for dysuria.  Musculoskeletal: Negative for back  pain. Skin: Negative for rash. Neurological: Negative for headache.   ____________________________________________   PHYSICAL EXAM:  VITAL SIGNS: ED Triage Vitals  Enc Vitals Group     BP 06/14/19 1106 (!) 160/86     Pulse Rate 06/14/19 1106 (!) 106     Resp 06/14/19 1106 15     Temp 06/14/19 1106 99.7 F (37.6 C)     Temp Source 06/14/19 1106 Oral     SpO2 06/14/19 1106 100 %     Weight 06/14/19 1110 220 lb (99.8 kg)     Height 06/14/19 1110 _0  (1.676 m)     Head Circumference --      Peak Flow --      Pain Score 06/14/19 1110 0     Pain Loc --      Pain Edu? --      Excl. in [place]? --     Constitutional: Alert and oriented.  Relatively well appearing and in no acute distress. Eyes: Conjunctivae are normal.  Head: Atraumatic. Nose: No congestion/rhinnorhea. Mouth/Throat: Mucous membranes are moist.   Neck: Normal range of motion.  Cardiovascular: Normal rate, regular rhythm. Good peripheral circulation. Respiratory: Normal respiratory effort.  No retractions.  Gastrointestinal: Soft and nontender. No distention.  Genitourinary: No flank tenderness. Musculoskeletal: No lower extremity edema.  Extremities warm and well perfused.  Neurologic:  Normal speech and language. No gross focal neurologic deficits are appreciated.  Skin:  Skin is warm and dry. No rash noted. Psychiatric: Mood and affect are normal. Speech and behavior are normal.  ____________________________________________   LABS (all labs ordered are listed, but only abnormal results are displayed)  Labs Reviewed  COMPREHENSIVE METABOLIC PANEL - Abnormal; Notable for the following components:      Result Value   Chloride 95 (*)    CO2 37 (*)    Glucose, Bld 119 (*)    Creatinine, Ser 0.39 (*)    Calcium 8.4 (*)    Albumin 3.3 (*)    AST 14 (*)    All other components within normal limits  CBC WITH DIFFERENTIAL/PLATELET - Abnormal; Notable for the following components:   RBC 2.86 (*)     Hemoglobin 8.1 (*)    HCT 29.3 (*)    MCV 102.4 (*)    MCHC 27.6 (*)  Abs Immature Granulocytes 0.16 (*)    All other components within normal limits  URINALYSIS, COMPLETE (UACMP) WITH MICROSCOPIC - Abnormal; Notable for the following components:   Color, Urine STRAW (*)    APPearance CLEAR (*)    All other components within normal limits  TROPONIN I (HIGH SENSITIVITY)   ____________________________________________  EKG  ED ECG REPORT I, Arta Silence, the attending physician, personally viewed and interpreted this ECG.  Date: 06/14/2019 EKG Time: 1111 Rate: 104 Rhythm: Sinus tachycardia with frequent PVCs QRS Axis: normal Intervals: normal ST/T Wave abnormalities: normal Narrative Interpretation: no evidence of acute ischemia  ____________________________________________  RADIOLOGY  CXR: Bilateral lower lobe patchy opacities  ____________________________________________   PROCEDURES  Procedure(s) performed: No  Procedures  Critical Care performed: No ____________________________________________   INITIAL IMPRESSION / ASSESSMENT AND PLAN / ED COURSE  Pertinent labs & imaging results that were available during my care of the patient were reviewed by me and considered in my medical decision making (see chart for details).  78 year old female with PMH as noted above presents with a feeling of generalized weakness since she awoke this morning.  The patient states that she feels shaky.  She denies any focal neurologic symptoms and her review of systems is otherwise negative.  I reviewed the past medical records in [place].  The patient was diagnosed with COVID-19 last month and her husband also had it.  The patient did not have severe symptoms and did not require hospitalization.  She states that she recovered and had been feeling well the last few weeks.  On exam, the patient is overall relatively well appearing for her age.  Her vital signs are normal except  for mild hypertension and low-grade temperature.  The physical exam is otherwise unremarkable.  Differential includes dehydration, other metabolic etiology, infection, or less likely cardiac cause.  We will obtain lab work-up, chest x-ray and UA, and reassess.  ----------------------------------------- 3:18 PM on 06/14/2019 -----------------------------------------  The lab work-up shows slightly worsening anemia from the patient's baseline, but not requiring transfusion or other acute intervention.  There are few mild electrolyte abnormalities but no significant findings that require treatment at this time.  The troponin is negative and there is no indication for a repeat.  The patient's heart rate has come down and her vital signs are normal.  O2 saturations 100% on her normal 4 L by nasal cannula.  Chest x-ray shows bilateral opacities in the lower lungs.  This could be atelectasis, or possible residual findings after the patient's COVID infection last month.  However, given her low-grade temperature and her acute fatigue, this also could represent a bacterial pneumonia.  The patient states that she would strongly prefer to go home.  I will prescribe a course of azithromycin for CAP.  I counseled the patient on the results of the work-up.  Return precautions given, and she expresses understanding. ________________________________  Kristen Schroeder was evaluated in Emergency Department on 06/14/2019 for the symptoms described in the history of present illness. She was evaluated in the context of the global COVID-19 pandemic, which necessitated consideration that the patient might be at risk for infection with the SARS-CoV-2 virus that causes COVID-19. Institutional protocols and algorithms that pertain to the evaluation of patients at risk for COVID-19 are in a state of rapid change based on information released by regulatory bodies including the CDC and federal and state organizations. These policies and  algorithms were followed during the patient's care in the ED.  ____________________________________________  FINAL CLINICAL IMPRESSION(S) / ED DIAGNOSES  Final diagnoses:  Generalized weakness  Community acquired pneumonia, unspecified laterality      NEW MEDICATIONS STARTED DURING THIS VISIT:  New Prescriptions   AZITHROMYCIN (ZITHROMAX Z-PAK) 250 MG TABLET    2 tabs PO on day 1, then 1 tab PO daily for 4 days     Note:  This document was prepared using Dragon voice recognition software and may include unintentional dictation errors.    Arta Silence, MD 06/14/19 [phone]

## 2019-06-14 NOTE — ED Triage Notes (Signed)
Pt to ED by EMS with c/o of generalized weakness upon waking this morning. Pt dx with COVID 19 approx 3 weeks ago but has no c/o of symptoms.

## 2019-06-14 NOTE — Discharge Instructions (Signed)
Take the antibiotic as prescribed and finish the full course.  Follow-up with your primary care doctor in the next week.  Return to the ER for new or worsening weakness, fever, shortness of breath, vomiting, or any other new or worsening symptoms that concern you.

## 2019-06-15 ENCOUNTER — Telehealth: Payer: Self-pay | Admitting: Family Medicine

## 2019-06-15 ENCOUNTER — Ambulatory Visit: Payer: Self-pay

## 2019-06-15 DIAGNOSIS — J42 Unspecified chronic bronchitis: Secondary | ICD-10-CM

## 2019-06-15 DIAGNOSIS — J449 Chronic obstructive pulmonary disease, unspecified: Secondary | ICD-10-CM | POA: Diagnosis not present

## 2019-06-15 DIAGNOSIS — I509 Heart failure, unspecified: Secondary | ICD-10-CM

## 2019-06-15 NOTE — Telephone Encounter (Signed)
Pt needing a hospital f/u with Dr. Rosanna Randy.  She stated if allowed she will do a telephone visit with Dr. Rosanna Randy.  Please advise.  Thanks, [employer]

## 2019-06-15 NOTE — Chronic Care Management (AMB) (Signed)
  Chronic Care Management   Follow Up Note   06/15/2019 Name: Kristen Schroeder MRN: [id number] DOB: January 13, 1941  Referred by: Jerrol Banana., MD Reason for referral : Chronic Care Management (incoming call)   Subjective: "I think I was just anxious because my daughter went home for the weekend"   Objective:  Assessment: Kristen Schroeder is a 78 year old female patient of Dr. Miguel Aschoff, who is well know to the CCM Team. [name] has been doing very well managing her health and has been very cautious to remain at home during covid-19 pandemic.Kristen Schroeder, her husband Kristen Schroeder, and her disabled son of the home Schroeder contracted the covid virus. CCM RN CM has followed closely and Schroeder goals related to covid have been met.   RN CM received an incoming call from Kristen Schroeder this morning. Kristen Schroeder states she presented to the ED yesterday for weakness. She states she did not feel bad, just weak and not herself. Her daughter, who has provided care to the Kristen Schroeder for over a month, went back home over the weekend to visit her husband and children. Kristen Schroeder states her sister check in on her and Kristen Schroeder but it was just not the same. Kristen Schroeder does admit that anxiety played a part in her feeling of uneasiness yesterday.   Review of patient status, including review of consultants reports, relevant laboratory and other test results, and collaboration with appropriate care team members and the patient's provider was performed as part of comprehensive patient evaluation and provision of chronic care management services.    Goals Addressed            This Visit's Progress   . I just wanted to let you know I went to the hospital yesterday (pt-stated)       Current Barriers:  . Anxiety  Nurse Case Manager Clinical Goal(s):  Kristen Schroeder Kitchen Over the next 30 days, patient will experience decrease in ED visits. ED visits in last 6 months = 1  Interventions:  . Reviewed ED provider notes . Assessed for  receipt and compliance with new prescription (zpack) . Assessed for additional respiratory symptoms . Discussed availability of practice after hours if needed and when unsure if ED is appropriate level of care . Discussed utilization of Ativan as prescribed for anxiety when needed and appropriate  Patient Self Care Activities:  . Self administers medications as prescribed . Attends Schroeder scheduled provider appointments . Calls pharmacy for medication refills . Attends church or other social activities . Performs ADL's independently . Performs IADL's independently . Calls provider office for new concerns or questions  Initial goal documentation          Telephone follow up appointment with care management team member scheduled for: 1 week   Kristen Schroeder E. Rollene Rotunda, RN, BSN Nurse Care Coordinator [facility] Care Management [phone]

## 2019-06-15 NOTE — Patient Instructions (Addendum)
  Thank you allowing the Chronic Care Management Team to be a part of your care! It was a pleasure speaking with you today!  1. Please take your ativan for anxiety as needed. 2. Please know you can call Dr. Marlan Palau office even on weekends if you need to speak to a nurse or a provider. They will call you back. 3. Continue to take all your medications as prescribed and follow your plan of care for heart failure and chronic bronchitis.  CCM (Chronic Care Management) Team   Trish Fountain RN, BSN Nurse Care Coordinator  [phone]  Ruben Reason PharmD  Clinical Pharmacist  [phone]   Elliot Gurney, LCSW Clinical Social Worker [phone]  Goals Addressed            This Visit's Progress   . I just wanted to let you know I went to the hospital yesterday (pt-stated)       Current Barriers:  . Anxiety  Nurse Case Manager Clinical Goal(s):  Marland Kitchen Over the next 30 days, patient will experience decrease in ED visits. ED visits in last 6 months = 1  Interventions:  . Reviewed ED provider notes . Assessed for receipt and compliance with new prescription (zpack) . Assessed for additional respiratory symptoms . Discussed availability of practice after hours if needed and when unsure if ED is appropriate level of care . Discussed utilization of Ativan as prescribed for anxiety when needed and appropriate  Patient Self Care Activities:  . Self administers medications as prescribed . Attends all scheduled provider appointments . Calls pharmacy for medication refills . Attends church or other social activities . Performs ADL's independently . Performs IADL's independently . Calls provider office for new concerns or questions  Initial goal documentation         The patient verbalized understanding of instructions provided today and declined a print copy of patient instruction materials.   Telephone follow up appointment with care management team member scheduled for: 1  week  SYMPTOMS OF A STROKE   You have any symptoms of stroke. "BE FAST" is an easy way to remember the main warning signs: ? B - Balance. Signs are dizziness, sudden trouble walking, or loss of balance. ? E - Eyes. Signs are trouble seeing or a sudden change in how you see. ? F - Face. Signs are sudden weakness or loss of feeling of the face, or the face or eyelid drooping on one side. ? A - Arms. Signs are weakness or loss of feeling in an arm. This happens suddenly and usually on one side of the body. ? S - Speech. Signs are sudden trouble speaking, slurred speech, or trouble understanding what people say. ? T - Time. Time to call emergency services. Write down what time symptoms started.  You have other signs of stroke, such as: ? A sudden, very bad headache with no known cause. ? Feeling sick to your stomach (nausea). ? Throwing up (vomiting). ? Jerky movements you cannot control (seizure).  SYMPTOMS OF A HEART ATTACK  What are the signs or symptoms? Symptoms of this condition include:  Chest pain. It may feel like: ? Crushing or squeezing. ? Tightness, pressure, fullness, or heaviness.  Pain in the arm, neck, jaw, back, or upper body.  Shortness of breath.  Heartburn.  Indigestion.  Nausea.  Cold sweats.  Feeling tired.  Sudden lightheadedness.

## 2019-06-17 ENCOUNTER — Other Ambulatory Visit: Payer: Self-pay

## 2019-06-17 ENCOUNTER — Ambulatory Visit (INDEPENDENT_AMBULATORY_CARE_PROVIDER_SITE_OTHER): Payer: Medicare Other | Admitting: Family Medicine

## 2019-06-17 DIAGNOSIS — J410 Simple chronic bronchitis: Secondary | ICD-10-CM | POA: Diagnosis not present

## 2019-06-17 DIAGNOSIS — G4733 Obstructive sleep apnea (adult) (pediatric): Secondary | ICD-10-CM

## 2019-06-17 DIAGNOSIS — U071 COVID-19: Secondary | ICD-10-CM

## 2019-06-17 NOTE — Progress Notes (Signed)
Patient: Kristen Schroeder Female    DOB: May 09, 1941   78 y.o.   MRN: [id number] Visit Date: 06/17/2019  Today's Provider: Wilhemena Durie, MD   Subjective:    Virtual Visit via Telephone Note  I connected with Kristen Schroeder on 06/17/19 at 11:00 AM EDT by telephone and verified that I am speaking with the correct person using two identifiers.  I discussed the limitations, risks, security and privacy concerns of performing an evaluation and management service by telephone and the availability of in person appointments. I also discussed with the patient that there may be a patient responsible charge related to this service. The patient expressed understanding and agreed to proceed.   History of Present Illness: F/u of Covid infection. Pt feels like she is almost back to normal. Cough and breathing are back to baseline. She still has some fatigue.   Observations/Objective:    Allergies  Allergen Reactions  . Codeine Nausea And Vomiting and Nausea Only     Current Outpatient Medications:  .  albuterol (PROVENTIL HFA;VENTOLIN HFA) 108 (90 Base) MCG/ACT inhaler, Inhale 2 puff every 4-6 hrs as needed for SOB, Disp: 1 Inhaler, Rfl: 3 .  arformoterol (BROVANA) 15 MCG/2ML NEBU, Take 2 mLs (15 mcg total) by nebulization 2 (two) times daily., Disp: 120 mL, Rfl: 11 .  aspirin EC 81 MG tablet, Take 81 mg by mouth daily., Disp: , Rfl:  .  azithromycin (ZITHROMAX Z-PAK) 250 MG tablet, 2 tabs PO on day 1, then 1 tab PO daily for 4 days, Disp: 6 each, Rfl: 0 .  benzonatate (TESSALON) 200 MG capsule, Take 1 capsule (200 mg total) by mouth 3 (three) times daily as needed., Disp: 30 capsule, Rfl: 0 .  blood glucose meter kit and supplies KIT, Dispense based on patient and insurance preference. Use up to four times daily as directed. (FOR ICD-9 250.00, 250.01)., Disp: 1 each, Rfl: 0 .  Blood Glucose Monitoring Suppl (ONE TOUCH ULTRA 2) w/Device KIT, 1 each by Does not apply route daily., Disp:  1 each, Rfl: 0 .  enalapril (VASOTEC) 20 MG tablet, Take 1 tablet (20 mg total) by mouth daily., Disp: 90 tablet, Rfl: 3 .  fluticasone (FLONASE) 50 MCG/ACT nasal spray, Place 1 spray into both nostrils daily as needed. , Disp: , Rfl:  .  furosemide (LASIX) 20 MG tablet, TAKE 1 TABLET BY MOUTH DAILY, Disp: 90 tablet, Rfl: 1 .  gabapentin (NEURONTIN) 300 MG capsule, 2 tablets in the morning, 1 tablet in the afternoon and 2 tablets in the evening, Disp: 450 capsule, Rfl: 3 .  glucose blood (ONE TOUCH ULTRA TEST) test strip, USE 1 STRIP 2 TIMES DAILY AND AS NEEDED, Disp: 100 each, Rfl: 12 .  HYDROcodone-acetaminophen (NORCO) 10-325 MG tablet, Take by mouth., Disp: , Rfl:  .  ipratropium-albuterol (DUONEB) 0.5-2.5 (3) MG/3ML SOLN, TAKE 3 MLS BY NEBULIZATION EVERY 6 (SIX) HOURS AS NEEDED., Disp: 360 mL, Rfl: 1 .  isosorbide mononitrate (IMDUR) 30 MG 24 hr tablet, Take 30 mg by mouth daily as needed (high bp)., Disp: , Rfl:  .  levothyroxine (SYNTHROID, LEVOTHROID) 125 MCG tablet, TAKE 1 TABLET BY MOUTH DAILY, Disp: 90 tablet, Rfl: 3 .  loratadine (CLARITIN) 10 MG tablet, TAKE 1 TABLET (10 MG TOTAL) BY MOUTH DAILY., Disp: 90 tablet, Rfl: 3 .  LORazepam (ATIVAN) 0.5 MG tablet, Take 1 tablet (0.5 mg total) by mouth 2 (two) times daily as needed for anxiety., Disp:  180 tablet, Rfl: 3 .  metFORMIN (GLUCOPHAGE) 1000 MG tablet, TAKE 1 TABLET BY MOUTH TWICE A DAY (Patient taking differently: Take 1,000 mg by mouth daily with breakfast. ), Disp: 180 tablet, Rfl: 3 .  mometasone-formoterol (DULERA) 100-5 MCG/ACT AERO, Inhale 2 puffs into the lungs 2 (two) times daily., Disp: 6 Inhaler, Rfl: 3 .  MULTIPLE VITAMIN PO, Take 1 tablet by mouth daily. , Disp: , Rfl:  .  omeprazole (PRILOSEC) 40 MG capsule, Take 1 capsule (40 mg total) by mouth daily., Disp: 90 capsule, Rfl: 1 .  ondansetron (ZOFRAN ODT) 4 MG disintegrating tablet, Take 1 tablet (4 mg total) by mouth every 8 (eight) hours as needed for nausea or  vomiting., Disp: 10 tablet, Rfl: 0 .  ondansetron (ZOFRAN-ODT) 4 MG disintegrating tablet, TAKE 1 TABLET BY MOUTH EVERY 8 HOURS AS NEEDED FOR NAUSEA AND VOMITING, Disp: 20 tablet, Rfl: 3 .  oxyCODONE-acetaminophen (PERCOCET/ROXICET) 5-325 MG tablet, Take 1 tablet by mouth every 6 (six) hours as needed for severe pain., Disp: 20 tablet, Rfl: 0 .  OXYGEN, Inhale 4 L into the lungs., Disp: , Rfl:  .  PARoxetine (PAXIL) 10 MG tablet, Take 1 tablet (10 mg total) by mouth at bedtime., Disp: 90 tablet, Rfl: 3 .  pravastatin (PRAVACHOL) 10 MG tablet, TAKE 1 TABLET (10 MG TOTAL) BY MOUTH DAILY., Disp: 90 tablet, Rfl: 3 .  predniSONE (STERAPRED UNI-PAK 21 TAB) 10 MG (21) TBPK tablet, Taper as directed., Disp: 21 tablet, Rfl: 0 .  roflumilast (DALIRESP) 500 MCG TABS tablet, Take by mouth., Disp: , Rfl:  .  tiotropium (SPIRIVA) 18 MCG inhalation capsule, Place into inhaler and inhale., Disp: , Rfl:   Review of Systems  Social History   Tobacco Use  . Smoking status: Former Smoker    Packs/day: 0.50    Years: 15.00    Pack years: 7.50  . Smokeless tobacco: Never Used  . Tobacco comment: 30-40 years ago  Substance Use Topics  . Alcohol use: No    Alcohol/week: 0.0 standard drinks      Objective:   There were no vitals taken for this visit. There were no vitals filed for this visit.   Physical Exam None.  No results found for any visits on 06/17/19.     Assessment & Plan     1. COVID-19 virus infection Improving--f/u in office 2-3 weeks.  2. Obstructive apnea   3. 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   Follow Up Instructions:    I discussed the assessment and treatment plan with the patient. The patient was provided an opportunity to ask questions and all were answered. The patient agreed with the plan and demonstrated an understanding of the instructions.   The patient was advised to call back or seek an in-person evaluation if the symptoms worsen or if the condition  fails to improve as anticipated.  I provided 12 minutes of non-face-to-face time during this encounter.        Cranford Mon, MD  [facility]

## 2019-06-23 ENCOUNTER — Other Ambulatory Visit: Payer: Self-pay

## 2019-06-23 ENCOUNTER — Ambulatory Visit: Payer: Medicare Other | Admitting: Internal Medicine

## 2019-06-23 VITALS — BP 147/67 | HR 80 | Ht 67.0 in

## 2019-06-29 ENCOUNTER — Other Ambulatory Visit: Payer: Self-pay | Admitting: Family Medicine

## 2019-06-29 DIAGNOSIS — G629 Polyneuropathy, unspecified: Secondary | ICD-10-CM

## 2019-06-30 ENCOUNTER — Other Ambulatory Visit: Payer: Self-pay | Admitting: Family Medicine

## 2019-06-30 ENCOUNTER — Telehealth: Payer: Self-pay

## 2019-06-30 DIAGNOSIS — R600 Localized edema: Secondary | ICD-10-CM

## 2019-07-02 ENCOUNTER — Other Ambulatory Visit: Payer: Self-pay | Admitting: Neurosurgery

## 2019-07-02 DIAGNOSIS — I671 Cerebral aneurysm, nonruptured: Secondary | ICD-10-CM

## 2019-07-03 DIAGNOSIS — J449 Chronic obstructive pulmonary disease, unspecified: Secondary | ICD-10-CM | POA: Diagnosis not present

## 2019-07-09 ENCOUNTER — Other Ambulatory Visit: Payer: Self-pay

## 2019-07-09 ENCOUNTER — Ambulatory Visit (INDEPENDENT_AMBULATORY_CARE_PROVIDER_SITE_OTHER): Payer: Medicare Other | Admitting: Family Medicine

## 2019-07-09 VITALS — BP 136/62 | HR 88 | Temp 98.9°F | Resp 20 | Ht 66.0 in | Wt 218.0 lb

## 2019-07-09 DIAGNOSIS — E039 Hypothyroidism, unspecified: Secondary | ICD-10-CM

## 2019-07-09 DIAGNOSIS — E119 Type 2 diabetes mellitus without complications: Secondary | ICD-10-CM | POA: Diagnosis not present

## 2019-07-09 DIAGNOSIS — G629 Polyneuropathy, unspecified: Secondary | ICD-10-CM

## 2019-07-09 DIAGNOSIS — Z23 Encounter for immunization: Secondary | ICD-10-CM

## 2019-07-09 DIAGNOSIS — I10 Essential (primary) hypertension: Secondary | ICD-10-CM

## 2019-07-09 DIAGNOSIS — J449 Chronic obstructive pulmonary disease, unspecified: Secondary | ICD-10-CM

## 2019-07-09 NOTE — Progress Notes (Signed)
Patient: Kristen Schroeder Female    DOB: 1941-04-14   78 y.o.   MRN: [id number] Visit Date: 07/10/2019  Today's Provider: Wilhemena Durie, MD   Chief Complaint  Patient presents with  . Follow-up   Subjective:   HPI Patient comes in today for a follow up. She feels well today. She does mention that the neuropathy in her legs and feet has worsened. She reports that this seems to be worse at night.  Her breathing is stable. She feels she has recovered from Covid 19 infection.sShe is wearing her Oxygen as always. BP Readings from Last 3 Encounters:  07/09/19 136/62  06/23/19 (!) 147/67  06/14/19 (!) 117/50   Wt Readings from Last 3 Encounters:  07/09/19 218 lb (98.9 kg)  06/14/19 220 lb (99.8 kg)  03/19/19 214 lb (97.1 kg)     Allergies  Allergen Reactions  . Codeine Nausea And Vomiting and Nausea Only     Current Outpatient Medications:  .  albuterol (PROVENTIL HFA;VENTOLIN HFA) 108 (90 Base) MCG/ACT inhaler, Inhale 2 puff every 4-6 hrs as needed for SOB, Disp: 1 Inhaler, Rfl: 3 .  arformoterol (BROVANA) 15 MCG/2ML NEBU, Take 2 mLs (15 mcg total) by nebulization 2 (two) times daily., Disp: 120 mL, Rfl: 11 .  aspirin EC 81 MG tablet, Take 81 mg by mouth daily., Disp: , Rfl:  .  benzonatate (TESSALON) 200 MG capsule, Take 1 capsule (200 mg total) by mouth 3 (three) times daily as needed., Disp: 30 capsule, Rfl: 0 .  blood glucose meter kit and supplies KIT, Dispense based on patient and insurance preference. Use up to four times daily as directed. (FOR ICD-9 250.00, 250.01)., Disp: 1 each, Rfl: 0 .  Blood Glucose Monitoring Suppl (ONE TOUCH ULTRA 2) w/Device KIT, 1 each by Does not apply route daily., Disp: 1 each, Rfl: 0 .  enalapril (VASOTEC) 20 MG tablet, Take 1 tablet (20 mg total) by mouth daily., Disp: 90 tablet, Rfl: 3 .  fluticasone (FLONASE) 50 MCG/ACT nasal spray, Place 1 spray into both nostrils daily as needed. , Disp: , Rfl:  .  furosemide (LASIX) 20 MG  tablet, TAKE 1 TABLET BY MOUTH EVERY DAY, Disp: 90 tablet, Rfl: 1 .  gabapentin (NEURONTIN) 300 MG capsule, TAKE 2 CAPSULES IN THE MORNING, 1 CAPSULE IN THE AFTERNOON AND 2 CAPSULES IN THE EVENING, Disp: 450 capsule, Rfl: 3 .  glucose blood (ONE TOUCH ULTRA TEST) test strip, USE 1 STRIP 2 TIMES DAILY AND AS NEEDED, Disp: 100 each, Rfl: 12 .  HYDROcodone-acetaminophen (NORCO) 10-325 MG tablet, Take by mouth., Disp: , Rfl:  .  ipratropium-albuterol (DUONEB) 0.5-2.5 (3) MG/3ML SOLN, TAKE 3 MLS BY NEBULIZATION EVERY 6 (SIX) HOURS AS NEEDED., Disp: 360 mL, Rfl: 1 .  isosorbide mononitrate (IMDUR) 30 MG 24 hr tablet, Take 30 mg by mouth daily as needed (high bp)., Disp: , Rfl:  .  levothyroxine (SYNTHROID, LEVOTHROID) 125 MCG tablet, TAKE 1 TABLET BY MOUTH DAILY, Disp: 90 tablet, Rfl: 3 .  loratadine (CLARITIN) 10 MG tablet, TAKE 1 TABLET (10 MG TOTAL) BY MOUTH DAILY., Disp: 90 tablet, Rfl: 3 .  LORazepam (ATIVAN) 0.5 MG tablet, Take 1 tablet (0.5 mg total) by mouth 2 (two) times daily as needed for anxiety., Disp: 180 tablet, Rfl: 3 .  metFORMIN (GLUCOPHAGE) 1000 MG tablet, TAKE 1 TABLET BY MOUTH TWICE A DAY (Patient taking differently: Take 1,000 mg by mouth daily with breakfast. ), Disp: 180 tablet,  Rfl: 3 .  mometasone-formoterol (DULERA) 100-5 MCG/ACT AERO, Inhale 2 puffs into the lungs 2 (two) times daily., Disp: 6 Inhaler, Rfl: 3 .  MULTIPLE VITAMIN PO, Take 1 tablet by mouth daily. , Disp: , Rfl:  .  omeprazole (PRILOSEC) 40 MG capsule, Take 1 capsule (40 mg total) by mouth daily., Disp: 90 capsule, Rfl: 1 .  ondansetron (ZOFRAN ODT) 4 MG disintegrating tablet, Take 1 tablet (4 mg total) by mouth every 8 (eight) hours as needed for nausea or vomiting., Disp: 10 tablet, Rfl: 0 .  ondansetron (ZOFRAN-ODT) 4 MG disintegrating tablet, TAKE 1 TABLET BY MOUTH EVERY 8 HOURS AS NEEDED FOR NAUSEA AND VOMITING, Disp: 20 tablet, Rfl: 3 .  oxyCODONE-acetaminophen (PERCOCET/ROXICET) 5-325 MG tablet, Take 1  tablet by mouth every 6 (six) hours as needed for severe pain., Disp: 20 tablet, Rfl: 0 .  OXYGEN, Inhale 4 L into the lungs., Disp: , Rfl:  .  PARoxetine (PAXIL) 10 MG tablet, Take 1 tablet (10 mg total) by mouth at bedtime., Disp: 90 tablet, Rfl: 3 .  pravastatin (PRAVACHOL) 10 MG tablet, TAKE 1 TABLET (10 MG TOTAL) BY MOUTH DAILY., Disp: 90 tablet, Rfl: 3 .  roflumilast (DALIRESP) 500 MCG TABS tablet, Take by mouth., Disp: , Rfl:  .  tiotropium (SPIRIVA) 18 MCG inhalation capsule, Place into inhaler and inhale., Disp: , Rfl:  .  azithromycin (ZITHROMAX Z-PAK) 250 MG tablet, 2 tabs PO on day 1, then 1 tab PO daily for 4 days (Patient not taking: Reported on 06/23/2019), Disp: 6 each, Rfl: 0 .  predniSONE (STERAPRED UNI-PAK 21 TAB) 10 MG (21) TBPK tablet, Taper as directed., Disp: 21 tablet, Rfl: 0  Review of Systems  Constitutional: Negative.   Eyes: Negative.   Respiratory: Positive for shortness of breath. Negative for cough.        Has COPD  Cardiovascular: Negative for chest pain, palpitations and leg swelling.  Endocrine: Negative for cold intolerance, heat intolerance, polydipsia, polyphagia and polyuria.  Musculoskeletal: Positive for arthralgias and myalgias.  Allergic/Immunologic: Negative.   Neurological: Positive for numbness.  Hematological: Negative.   Psychiatric/Behavioral: Negative.     Social History   Tobacco Use  . Smoking status: Former Smoker    Packs/day: 0.50    Years: 15.00    Pack years: 7.50  . Smokeless tobacco: Never Used  . Tobacco comment: 30-40 years ago  Substance Use Topics  . Alcohol use: No    Alcohol/week: 0.0 standard drinks      Objective:   BP 136/62   Pulse 88   Temp 98.9 F (37.2 C)   Resp 20   Ht '5\' 6"'  (1.676 m)   Wt 218 lb (98.9 kg)   SpO2 97% Comment: with 2L of O2  BMI 35.19 kg/m  Vitals:   07/09/19 1632  BP: 136/62  Pulse: 88  Resp: 20  Temp: 98.9 F (37.2 C)  SpO2: 97%  Weight: 218 lb (98.9 kg)  Height: '5\' 6"'   (1.676 m)  Body mass index is 35.19 kg/m.   Physical Exam Vitals signs reviewed.  Constitutional:      Appearance: She is obese.  HENT:     Head: Normocephalic and atraumatic.     Right Ear: External ear normal.     Left Ear: External ear normal.  Eyes:     General: No scleral icterus.    Conjunctiva/sclera: Conjunctivae normal.  Neck:     Vascular: No carotid bruit.  Cardiovascular:     Rate  and Rhythm: Normal rate and regular rhythm.     Pulses: Normal pulses.     Heart sounds: Normal heart sounds.  Pulmonary:     Effort: Pulmonary effort is normal.     Breath sounds: Normal breath sounds.  Abdominal:     Palpations: Abdomen is soft.  Lymphadenopathy:     Cervical: No cervical adenopathy.  Skin:    General: Skin is warm and dry.  Neurological:     General: No focal deficit present.     Mental Status: She is alert and oriented to person, place, and time.  Psychiatric:        Mood and Affect: Mood normal.        Behavior: Behavior normal.        Thought Content: Thought content normal.        Judgment: Judgment normal.      No results found for any visits on 07/09/19.     Assessment & Plan    1. Essential (primary) hypertension Controlled on enalapril.  2. Adult hypothyroidism   3. Type 2 diabetes mellitus without complication, without long-term current use of insulin (HCC) On metformin.  4. Neuropathy Continue gabapentin  5. Need for influenza vaccination  - Flu Vaccine QUAD High Dose(Fluad)  6. Chronic obstructive pulmonary disease, unspecified COPD type ([place]) Per pulmonary.     Samoria Fedorko Cranford Mon, MD  [facility]

## 2019-07-10 ENCOUNTER — Ambulatory Visit
Admission: RE | Admit: 2019-07-10 | Discharge: 2019-07-10 | Disposition: A | Discharge status: Home / Self Care | Payer: Medicare Other | Source: Ambulatory Visit | Attending: Neurosurgery | Admitting: Neurosurgery

## 2019-07-10 DIAGNOSIS — I671 Cerebral aneurysm, nonruptured: Secondary | ICD-10-CM

## 2019-07-10 MED ORDER — IOPAMIDOL (ISOVUE-370) INJECTION 76%
75.0000 mL | Freq: Once | INTRAVENOUS | Status: AC | PRN
  Administered 2019-07-10: 75 mL via INTRAVENOUS

## 2019-07-14 ENCOUNTER — Encounter: Payer: Self-pay | Admitting: Internal Medicine

## 2019-07-14 ENCOUNTER — Ambulatory Visit (INDEPENDENT_AMBULATORY_CARE_PROVIDER_SITE_OTHER): Payer: Medicare Other | Admitting: Internal Medicine

## 2019-07-14 ENCOUNTER — Other Ambulatory Visit: Payer: Self-pay

## 2019-07-14 VITALS — HR 117 | Resp 16 | Ht 66.0 in | Wt 216.0 lb

## 2019-07-14 DIAGNOSIS — J9611 Chronic respiratory failure with hypoxia: Secondary | ICD-10-CM | POA: Diagnosis not present

## 2019-07-14 DIAGNOSIS — J449 Chronic obstructive pulmonary disease, unspecified: Secondary | ICD-10-CM | POA: Diagnosis not present

## 2019-07-14 DIAGNOSIS — R0602 Shortness of breath: Secondary | ICD-10-CM | POA: Diagnosis not present

## 2019-07-14 DIAGNOSIS — I509 Heart failure, unspecified: Secondary | ICD-10-CM | POA: Diagnosis not present

## 2019-07-14 DIAGNOSIS — Z9981 Dependence on supplemental oxygen: Secondary | ICD-10-CM | POA: Diagnosis not present

## 2019-07-14 NOTE — Progress Notes (Signed)
[facility] [place], [place] [address]  Pulmonary Sleep Medicine   Office Visit Note  Patient Name: Kristen Schroeder DOB: 06/03/1941 MRN [id number]  Date of Service: 07/14/2019  Complaints/HPI: Pt is here for follow up.  She is currently on 4 lpm in office.  Her saturation is 93%.  She did a 6 minute walk today, and she dropped ot 77% without oxygen.  She is concerned because at 4 lpm she can not go out and be gone from home for very long.  Her portable tanks do not last long enough for her to even go to church.  She is interested in a portable concentrator to improve her quality of life.   ROS  General: (-) fever, (-) chills, (-) night sweats, (-) weakness Skin: (-) rashes, (-) itching,. Eyes: (-) visual changes, (-) redness, (-) itching. Nose and Sinuses: (-) nasal stuffiness or itchiness, (-) postnasal drip, (-) nosebleeds, (-) sinus trouble. Mouth and Throat: (-) sore throat, (-) hoarseness. Neck: (-) swollen glands, (-) enlarged thyroid, (-) neck pain. Respiratory: - cough, (-) bloody sputum, - shortness of breath, - wheezing. Cardiovascular: - ankle swelling, (-) chest pain. Lymphatic: (-) lymph node enlargement. Neurologic: (-) numbness, (-) tingling. Psychiatric: (-) anxiety, (-) depression   Current Medication: Outpatient Encounter Medications as of 07/14/2019  Medication Sig Note  . albuterol (PROVENTIL HFA;VENTOLIN HFA) 108 (90 Base) MCG/ACT inhaler Inhale 2 puff every 4-6 hrs as needed for SOB   . arformoterol (BROVANA) 15 MCG/2ML NEBU Take 2 mLs (15 mcg total) by nebulization 2 (two) times daily. 10/15/2018: Patient states she does not have this medication  . aspirin EC 81 MG tablet Take 81 mg by mouth daily.   Marland Kitchen azithromycin (ZITHROMAX Z-PAK) 250 MG tablet 2 tabs PO on day 1, then 1 tab PO daily for 4 days   . benzonatate (TESSALON) 200 MG capsule Take 1 capsule (200 mg total) by mouth 3 (three) times daily as needed.   . blood glucose meter  kit and supplies KIT Dispense based on patient and insurance preference. Use up to four times daily as directed. (FOR ICD-9 250.00, 250.01).   . Blood Glucose Monitoring Suppl (ONE TOUCH ULTRA 2) w/Device KIT 1 each by Does not apply route daily.   . enalapril (VASOTEC) 20 MG tablet Take 1 tablet (20 mg total) by mouth daily.   . fluticasone (FLONASE) 50 MCG/ACT nasal spray Place 1 spray into both nostrils daily as needed.    . furosemide (LASIX) 20 MG tablet TAKE 1 TABLET BY MOUTH EVERY DAY   . gabapentin (NEURONTIN) 300 MG capsule TAKE 2 CAPSULES IN THE MORNING, 1 CAPSULE IN THE AFTERNOON AND 2 CAPSULES IN THE EVENING   . glucose blood (ONE TOUCH ULTRA TEST) test strip USE 1 STRIP 2 TIMES DAILY AND AS NEEDED   . HYDROcodone-acetaminophen (NORCO) 10-325 MG tablet Take by mouth. 10/15/2018: Patient no longer has this medication  . ipratropium-albuterol (DUONEB) 0.5-2.5 (3) MG/3ML SOLN TAKE 3 MLS BY NEBULIZATION EVERY 6 (SIX) HOURS AS NEEDED.   Marland Kitchen isosorbide mononitrate (IMDUR) 30 MG 24 hr tablet Take 30 mg by mouth daily as needed (high bp). 10/15/2018: Patient states she takes this medication every day. Patient states her pressures at home run 130s. See last 3 BP  . levothyroxine (SYNTHROID, LEVOTHROID) 125 MCG tablet TAKE 1 TABLET BY MOUTH DAILY   . loratadine (CLARITIN) 10 MG tablet TAKE 1 TABLET (10 MG TOTAL) BY MOUTH DAILY. 10/15/2018: Takes as needed  .  LORazepam (ATIVAN) 0.5 MG tablet Take 1 tablet (0.5 mg total) by mouth 2 (two) times daily as needed for anxiety. 10/15/2018: Has not taken "in a while"  . metFORMIN (GLUCOPHAGE) 1000 MG tablet TAKE 1 TABLET BY MOUTH TWICE A DAY (Patient taking differently: Take 1,000 mg by mouth daily with breakfast. )   . mometasone-formoterol (DULERA) 100-5 MCG/ACT AERO Inhale 2 puffs into the lungs 2 (two) times daily. 10/15/2018: Patient has one inhaler left with 38 doses. Uses twice a day, sometimes as a rescue but not often. Has 3 symbicort 60 puff each to  use until [facility] can be purchased.  . MULTIPLE VITAMIN PO Take 1 tablet by mouth daily.    Marland Kitchen omeprazole (PRILOSEC) 40 MG capsule Take 1 capsule (40 mg total) by mouth daily. 10/15/2018: Taking as needed  . ondansetron (ZOFRAN ODT) 4 MG disintegrating tablet Take 1 tablet (4 mg total) by mouth every 8 (eight) hours as needed for nausea or vomiting.   . ondansetron (ZOFRAN-ODT) 4 MG disintegrating tablet TAKE 1 TABLET BY MOUTH EVERY 8 HOURS AS NEEDED FOR NAUSEA AND VOMITING   . oxyCODONE-acetaminophen (PERCOCET/ROXICET) 5-325 MG tablet Take 1 tablet by mouth every 6 (six) hours as needed for severe pain. 10/15/2018: Patient took all this medication and no refills  . OXYGEN Inhale 4 L into the lungs. 10/15/2018: Patient uses 3 to 3 1/2 liters continuously  . PARoxetine (PAXIL) 10 MG tablet Take 1 tablet (10 mg total) by mouth at bedtime. 10/15/2018: Patient did a self ween and does not feel like she need antidepressant any longer  . pravastatin (PRAVACHOL) 10 MG tablet TAKE 1 TABLET (10 MG TOTAL) BY MOUTH DAILY.   Marland Kitchen predniSONE (STERAPRED UNI-PAK 21 TAB) 10 MG (21) TBPK tablet Taper as directed.   . roflumilast (DALIRESP) 500 MCG TABS tablet Take by mouth. 10/15/2018: Patient is unsure if she is taking this medication however recent pulmonary visit indicates that she is.  . tiotropium (SPIRIVA) 18 MCG inhalation capsule Place into inhaler and inhale. 10/15/2018: Patient will resume taking once dulera inhaler is completed (until she can afford new dulera)   No facility-administered encounter medications on file as of 07/14/2019.     Surgical History: Past Surgical History:  Procedure Laterality Date  . ABDOMINAL HYSTERECTOMY    . BOWEL RESECTION N/A 04/14/2018   Procedure: SMALL BOWEL RESECTION;  Surgeon: Jules Husbands, MD;  Location: [facility] ORS;  Service: General;  Laterality: N/A;  . BREAST BIOPSY Right    benign  . CATARACT EXTRACTION W/PHACO Left 07/03/2016   Procedure: CATARACT EXTRACTION PHACO  AND INTRAOCULAR LENS PLACEMENT (IOC);  Surgeon: Birder Robson, MD;  Location: [facility] ORS;  Service: Ophthalmology;  Laterality: Left;  Lot: [id number] H [place]: 00:40.1 AP%: 17.4 CDE:6.94  . HERNIA REPAIR    . LAPAROTOMY N/A 09/04/2018   Procedure: EXPLORATORY LAPAROTOMY;  Surgeon: Olean Ree, MD;  Location: [facility] ORS;  Service: General;  Laterality: N/A;  . TUBAL LIGATION    . VENTRAL HERNIA REPAIR N/A 04/14/2018   Procedure: HERNIA REPAIR VENTRAL ADULT;  Surgeon: Jules Husbands, MD;  Location: [facility] ORS;  Service: General;  Laterality: N/A;    Medical History: Past Medical History:  Diagnosis Date  . Arthritis   . COPD (chronic obstructive pulmonary disease) ([place])   . Diabetes mellitus without complication ([place])   . Dysrhythmia   . Heart murmur   . History of orthopnea   . Hypertension   . Hypothyroidism   . Neuropathy   .  Oxygen dependent    3L  CONTINUOUS  . Pain CHRONIC BACK PAIN  . Shortness of breath dyspnea   . Wheezing     Family History: Family History  Problem Relation Age of Onset  . Heart disease Mother   . Drug abuse Other   . Hypertension Other     Social History: Social History   Socioeconomic History  . Marital status: Married    Spouse name: Not on file  . Number of children: 4  . Years of education: Not on file  . Highest education level: 8th grade  Occupational History  . Occupation: retired  Scientific laboratory technician  . Financial resource strain: Not very hard  . Food insecurity    Worry: Never true    Inability: Never true  . Transportation needs    Medical: No    Non-medical: No  Tobacco Use  . Smoking status: Former Smoker    Packs/day: 0.50    Years: 15.00    Pack years: 7.50  . Smokeless tobacco: Never Used  . Tobacco comment: 30-40 years ago  Substance and Sexual Activity  . Alcohol use: No    Alcohol/week: 0.0 standard drinks  . Drug use: No  . Sexual activity: Never  Lifestyle  . Physical activity    Days per week: 0 days    Minutes  per session: 0 min  . Stress: Not at all  Relationships  . Social Herbalist on phone: Patient refused    Gets together: Patient refused    Attends religious service: Patient refused    Active member of club or organization: Patient refused    Attends meetings of clubs or organizations: Patient refused    Relationship status: Patient refused  . Intimate partner violence    Fear of current or ex partner: No    Emotionally abused: No    Physically abused: No    Forced sexual activity: No  Other Topics Concern  . Not on file  Social History Narrative  . Not on file    Vital Signs: Pulse (!) 117, resp. rate 16, height '5\' 6"'  (1.676 m), weight 216 lb (98 kg), SpO2 93 %.  Examination: General Appearance: The patient is well-developed, well-nourished, and in no distress. Skin: Gross inspection of skin unremarkable. Head: normocephalic, no gross deformities. Eyes: no gross deformities noted. ENT: ears appear grossly normal no exudates. Neck: Supple. No thyromegaly. No LAD. Respiratory: clear bilateraly. Cardiovascular: Normal S1 and S2 without murmur or rub. Extremities: No cyanosis. pulses are equal. Neurologic: Alert and oriented. No involuntary movements.  LABS: Recent Results (from the past 2160 hour(s))  Novel Coronavirus, NAA ([facility])     Status: Abnormal   Collection Time: 05/12/19  9:39 AM  Result Value Ref Range   SARS-CoV-2, NAA Detected (A) Not Detected    Comment: Testing was performed using the cobas(R) SARS-CoV-2 test. This test was developed and its performance characteristics determined by [employer]. This test has not been FDA cleared or approved. This test has been authorized by FDA under an Emergency Use Authorization (EUA). This test is only authorized for the duration of time the declaration that circumstances exist justifying the authorization of the emergency use of in vitro diagnostic tests for detection of SARS-CoV-2 virus and/or  diagnosis of COVID-19 infection under section 564(b)(1) of the Act, 21 U.S.C. 967RFF-6(B)(8), unless the authorization is terminated or revoked sooner. When diagnostic testing is negative, the possibility of a false negative result should be considered  in the context of a patient's recent exposures and the presence of clinical signs and symptoms consistent with COVID-19. An individual without symptoms of COVID-19 and who is not shedding SARS-CoV-2 virus would expect to have a negati ve (not detected) result in this assay.   Comprehensive metabolic panel     Status: Abnormal   Collection Time: 06/14/19 11:13 AM  Result Value Ref Range   Sodium 140 135 - 145 mmol/L   Potassium 4.5 3.5 - 5.1 mmol/L   Chloride 95 (L) 98 - 111 mmol/L   CO2 37 (H) 22 - 32 mmol/L   Glucose, Bld 119 (H) 70 - 99 mg/dL   BUN 12 8 - 23 mg/dL   Creatinine, Ser 0.39 (L) 0.44 - 1.00 mg/dL   Calcium 8.4 (L) 8.9 - 10.3 mg/dL   Total Protein 6.7 6.5 - 8.1 g/dL   Albumin 3.3 (L) 3.5 - 5.0 g/dL   AST 14 (L) 15 - 41 U/L   ALT 7 0 - 44 U/L   Alkaline Phosphatase 60 38 - 126 U/L   Total Bilirubin 0.5 0.3 - 1.2 mg/dL   GFR calc non Af Amer >60 >60 mL/min   GFR calc Af Amer >60 >60 mL/min   Anion gap 8 5 - 15    Comment: Performed at [facility], [address] [place], [place] [address]  CBC with Differential     Status: Abnormal   Collection Time: 06/14/19 11:13 AM  Result Value Ref Range   WBC 8.6 4.0 - 10.5 K/uL   RBC 2.86 (L) 3.87 - 5.11 MIL/uL   Hemoglobin 8.1 (L) 12.0 - 15.0 g/dL   HCT 29.3 (L) 36.0 - 46.0 %   MCV 102.4 (H) 80.0 - 100.0 fL   MCH 28.3 26.0 - 34.0 pg   MCHC 27.6 (L) 30.0 - 36.0 g/dL   RDW 14.7 11.5 - 15.5 %   Platelets 187 150 - 400 K/uL   nRBC 0.0 0.0 - 0.2 %   Neutrophils Relative % 72 %   Neutro Abs 6.2 1.7 - 7.7 K/uL   Lymphocytes Relative 12 %   Lymphs Abs 1.1 0.7 - 4.0 K/uL   Monocytes Relative 11 %   Monocytes Absolute 0.9 0.1 - 1.0 K/uL   Eosinophils Relative 3 %    Eosinophils Absolute 0.2 0.0 - 0.5 K/uL   Basophils Relative 0 %   Basophils Absolute 0.0 0.0 - 0.1 K/uL   Immature Granulocytes 2 %   Abs Immature Granulocytes 0.16 (H) 0.00 - 0.07 K/uL    Comment: Performed at [facility], [place], [place] [address]  Troponin I (High Sensitivity)     Status: None   Collection Time: 06/14/19 11:13 AM  Result Value Ref Range   Troponin I (High Sensitivity) 7 <18 ng/L    Comment: (NOTE) Elevated high sensitivity troponin I (hsTnI) values and significant  changes across serial measurements may suggest ACS but many other  chronic and acute conditions are known to elevate hsTnI results.  Refer to the "Links" section for chest pain algorithms and additional  guidance. Performed at [facility], [address] [place], [place] [address]   Urinalysis, Complete w Microscopic     Status: Abnormal   Collection Time: 06/14/19  1:54 PM  Result Value Ref Range   Color, Urine STRAW (A) YELLOW   APPearance CLEAR (A) CLEAR   Specific Gravity, Urine 1.006 1.005 - 1.030   pH 6.0 5.0 - 8.0  Glucose, UA NEGATIVE NEGATIVE mg/dL   Hgb urine dipstick NEGATIVE NEGATIVE   Bilirubin Urine NEGATIVE NEGATIVE   Ketones, ur NEGATIVE NEGATIVE mg/dL   Protein, ur NEGATIVE NEGATIVE mg/dL   Nitrite NEGATIVE NEGATIVE   Leukocytes,Ua NEGATIVE NEGATIVE   RBC / HPF 0-5 0 - 5 RBC/hpf   WBC, UA 0-5 0 - 5 WBC/hpf   Bacteria, UA NONE SEEN NONE SEEN   Squamous Epithelial / LPF 0-5 0 - 5    Comment: Performed at [facility], [address], [place], [place] [address]    Radiology: Ct Angio Head W Or Wo Contrast  Result Date: 07/10/2019 CLINICAL DATA:  Cerebral aneurysm, non-ruptured. EXAM: CT ANGIOGRAPHY HEAD TECHNIQUE: Multidetector CT imaging of the head was performed using the standard protocol during bolus administration of intravenous contrast. Multiplanar CT image reconstructions and MIPs were obtained to evaluate the vascular  anatomy. CONTRAST:  1m ISOVUE-370 IOPAMIDOL (ISOVUE-370) INJECTION 76% COMPARISON:  CT angiogram head 07/04/2018 FINDINGS: CT HEAD Brain: There is no acute intracranial hemorrhage. No demarcated territorial infarction. No evidence of intracranial mass. No midline shift or extra-axial fluid collection. Cerebral volume is normal for age. Partially empty sella turcica. Vascular: Reported separately Skull: No calvarial fracture or suspicious osseous lesion. Sinuses: Moderate-size right maxillary sinus mucous retention cyst. No significant mastoid effusion. Orbits: The imaged globes and orbits demonstrate no acute abnormality. CTA HEAD Anterior circulation: Atherosclerotic calcification of the intracranial carotid artery siphons with no more than mild luminal narrowing. The right middle and anterior cerebral arteries are patent without significant proximal stenosis. The left middle and anterior cerebral arteries are patent without significant proximal stenosis. 3 mm aneurysm projecting superiorly from the ophthalmic segment of the left internal carotid artery, unchanged from prior examination. 3 mm aneurysm projecting posteriorly from the origin of the right posterior communicating artery, also unchanged from prior examination. Posterior circulation: The intracranial vertebral arteries are patent without significant stenosis, as is the basilar artery. The bilateral posterior cerebral arteries are patent without significant proximal stenosis. No posterior circulation aneurysm is identified. Venous sinuses: Within the limitations of contrast timing, no evidence of thrombosis. Anatomic variants: None significant IMPRESSION: CT head: Stable CT appearance of the brain with no acute intracranial abnormality. 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 No large vessel occlusion or proximal high-grade arterial  stenosis. Electronically Signed   By: KKellie Simmering  On: 07/10/2019 20:19    No results found.  Ct Angio Head W Or Wo Contrast  Result Date: 07/10/2019 CLINICAL DATA:  Cerebral aneurysm, non-ruptured. EXAM: CT ANGIOGRAPHY HEAD TECHNIQUE: Multidetector CT imaging of the head was performed using the standard protocol during bolus administration of intravenous contrast. Multiplanar CT image reconstructions and MIPs were obtained to evaluate the vascular anatomy. CONTRAST:  793mISOVUE-370 IOPAMIDOL (ISOVUE-370) INJECTION 76% COMPARISON:  CT angiogram head 07/04/2018 FINDINGS: CT HEAD Brain: There is no acute intracranial hemorrhage. No demarcated territorial infarction. No evidence of intracranial mass. No midline shift or extra-axial fluid collection. Cerebral volume is normal for age. Partially empty sella turcica. Vascular: Reported separately Skull: No calvarial fracture or suspicious osseous lesion. Sinuses: Moderate-size right maxillary sinus mucous retention cyst. No significant mastoid effusion. Orbits: The imaged globes and orbits demonstrate no acute abnormality. CTA HEAD Anterior circulation: Atherosclerotic calcification of the intracranial carotid artery siphons with no more than mild luminal narrowing. The right middle and anterior cerebral arteries are patent without  significant proximal stenosis. The left middle and anterior cerebral arteries are patent without significant proximal stenosis. 3 mm aneurysm projecting superiorly from the ophthalmic segment of the left internal carotid artery, unchanged from prior examination. 3 mm aneurysm projecting posteriorly from the origin of the right posterior communicating artery, also unchanged from prior examination. Posterior circulation: The intracranial vertebral arteries are patent without significant stenosis, as is the basilar artery. The bilateral posterior cerebral arteries are patent without significant proximal stenosis. No posterior circulation  aneurysm is identified. Venous sinuses: Within the limitations of contrast timing, no evidence of thrombosis. Anatomic variants: None significant IMPRESSION: CT head: Stable CT appearance of the brain with no acute intracranial abnormality. 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 No large vessel occlusion or proximal high-grade arterial stenosis. Electronically Signed   By: Kellie Simmering   On: 07/10/2019 20:19      Assessment and Plan: Patient Active Problem List   Diagnosis Date Noted  . Incarcerated hernia of abdominal cavity 08/04/2018  . SBO (small bowel obstruction) ([place]) 04/22/2018  . Incarcerated hernia 04/14/2018  . Aneurysm ([place]) 07/10/2016  . Nonspecific abnormal finding 05/11/2015  . Allergic rhinitis 05/11/2015  . Anxiety 05/11/2015  . Bronchitis, chronic ([place]) 05/11/2015  . CCF (congestive cardiac failure) ([place]) 05/11/2015  . Clinical depression 05/11/2015  . 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 Essential (primary) hypertension 05/11/2015  . Acid reflux 05/11/2015  . HLD (hyperlipidemia) 05/11/2015  . Adult hypothyroidism 05/11/2015  . Cervical dysplasia, mild 05/11/2015  . Neuropathy 05/11/2015  . Adiposity 05/11/2015  . Obstructive apnea 05/11/2015  . Arthritis, degenerative 05/11/2015    1. Chronic obstructive pulmonary disease, unspecified COPD type ([place]) Continue to use inhalers as prescribed.  Continue to use oxygen continuous.   2. Chronic respiratory failure with hypoxia ([facility]) Doing fair at present flow rate.   3. Oxygen dependent Continue to use oxygen at 4lpm day and night.   4. Congestive heart failure, unspecified HF chronicity, unspecified heart failure type ([place]) Stable, continue to follow cardiology recommendation, and see them as scheduled.   5. SOB (shortness of breath) FEV1 is unchagned at 0.6, and 33 %  of pre-predicted value. - Spirometry with Graph - 6 minute walk  General Counseling: I have discussed the findings of the evaluation and examination with Haniyah.  I have also discussed any further diagnostic evaluation thatmay be needed or ordered today. Mylin verbalizes understanding of the findings of todays visit. We also reviewed her medications today and discussed drug interactions and side effects including but not limited excessive drowsiness and altered mental states. We also discussed that there is always a risk not just to her but also people around her. she has been encouraged to call the office with any questions or concerns that should arise related to todays visit.    Time spent: 30  I have personally obtained a history, examined the patient, evaluated laboratory and imaging results, formulated the assessment and plan and placed orders.    Allyne Gee, MD [facility] Pulmonary and Critical Care Sleep medicine

## 2019-07-15 ENCOUNTER — Other Ambulatory Visit: Payer: Self-pay

## 2019-07-15 ENCOUNTER — Telehealth: Payer: Self-pay | Admitting: Family Medicine

## 2019-07-15 MED ORDER — AZITHROMYCIN 250 MG PO TABS: ORAL_TABLET | ORAL | 2 refills | Status: DC

## 2019-07-15 NOTE — Telephone Encounter (Signed)
Pt was just in last week and seems to be getting a cold.  Wanting something called in.  Please call her back to let her know.  Thanks, [employer]

## 2019-07-15 NOTE — Telephone Encounter (Signed)
Zpak for this pt with COPD --2 rf.

## 2019-07-15 NOTE — Telephone Encounter (Signed)
Medication sent to pharmacy. Patient notified.

## 2019-07-15 NOTE — Telephone Encounter (Signed)
Tried to call patient back to find out about symptoms, no answer, left voicemail.

## 2019-07-16 DIAGNOSIS — J449 Chronic obstructive pulmonary disease, unspecified: Secondary | ICD-10-CM | POA: Diagnosis not present

## 2019-07-16 DIAGNOSIS — I671 Cerebral aneurysm, nonruptured: Secondary | ICD-10-CM | POA: Diagnosis not present

## 2019-07-20 ENCOUNTER — Other Ambulatory Visit: Payer: Self-pay | Admitting: Family Medicine

## 2019-08-02 NOTE — Progress Notes (Signed)
Cancel.

## 2019-08-03 DIAGNOSIS — J449 Chronic obstructive pulmonary disease, unspecified: Secondary | ICD-10-CM | POA: Diagnosis not present

## 2019-08-04 DIAGNOSIS — I10 Essential (primary) hypertension: Secondary | ICD-10-CM | POA: Diagnosis not present

## 2019-08-04 DIAGNOSIS — J8410 Pulmonary fibrosis, unspecified: Secondary | ICD-10-CM | POA: Diagnosis not present

## 2019-08-04 DIAGNOSIS — R0902 Hypoxemia: Secondary | ICD-10-CM | POA: Diagnosis not present

## 2019-08-04 DIAGNOSIS — R0602 Shortness of breath: Secondary | ICD-10-CM | POA: Diagnosis not present

## 2019-08-04 DIAGNOSIS — E7849 Other hyperlipidemia: Secondary | ICD-10-CM | POA: Diagnosis not present

## 2019-08-10 ENCOUNTER — Telehealth: Payer: Self-pay | Admitting: Family Medicine

## 2019-08-10 ENCOUNTER — Telehealth: Payer: Self-pay

## 2019-08-10 NOTE — Telephone Encounter (Signed)
Biotin OTC  may help.  We will check thyroid on her next visit

## 2019-08-10 NOTE — Telephone Encounter (Signed)
Pt needing to discuss her thyroid medication.  She doesn't have the bottle to tell the name.  Needing a call back at [phone] (H)  Thanks, [employer]

## 2019-08-10 NOTE — Telephone Encounter (Signed)
Spoke with [facility] and they will have resp tech do an evaluation at home and see which device pt can tolerate regarding oxygen. Beth

## 2019-08-10 NOTE — Telephone Encounter (Signed)
Patient reports that she went to the beautician today and her hair is shedding more. I asked if she has started any new shampoos, hair products, etc and she has not. She has used the same beautician for many years.  She reports that she looked in her scalp, and it appears healthy for the most part. She is wondering if her thyroid medication needs to be adjusted? Or are there any other medications that she is taking that could contribute to hair loss? Please advise. Thanks!

## 2019-08-10 NOTE — Telephone Encounter (Signed)
Advised patient as below.  

## 2019-08-13 ENCOUNTER — Telehealth: Payer: Self-pay | Admitting: Family Medicine

## 2019-08-13 NOTE — Telephone Encounter (Signed)
Please advise 

## 2019-08-13 NOTE — Telephone Encounter (Signed)
Pt calling for sneezing and doesn't want to get a cold.  Needing something called in. Wants something stronger than a Zpak.  Thanks, [employer]

## 2019-08-13 NOTE — Telephone Encounter (Signed)
Patient advised.

## 2019-08-13 NOTE — Telephone Encounter (Signed)
No antibiotic stops a cold. A Sneeze is not a problem Most likely allergies.. If she gets short of breath or a cough or a fever then let us know.

## 2019-08-15 DIAGNOSIS — J449 Chronic obstructive pulmonary disease, unspecified: Secondary | ICD-10-CM | POA: Diagnosis not present

## 2019-08-25 ENCOUNTER — Telehealth: Payer: Self-pay | Admitting: Family Medicine

## 2019-08-25 DIAGNOSIS — J329 Chronic sinusitis, unspecified: Secondary | ICD-10-CM

## 2019-08-25 NOTE — Telephone Encounter (Signed)
Patient reports that she has had nasal congestion, PND, and headache for the last 4 days. She is now developing a cough. She reports that symptoms are worse at night due to the drainage. She denies fever and chills.  She feels that she has a sinus infection. She has taken OTC allergy/sinus meds with no relief. She is requesting something other than a Zpak to help with this. [facility]. Please advise. Thanks!

## 2019-08-25 NOTE — Telephone Encounter (Signed)
Pt wanting a call back regarding a cold she feels like is coming on.  Please call pt back at [phone].  Thanks, [employer]

## 2019-08-26 MED ORDER — DOXYCYCLINE HYCLATE 100 MG PO TABS: 100.0000 mg | ORAL_TABLET | Freq: Two times a day (BID) | ORAL | 0 refills | Status: AC

## 2019-08-26 NOTE — Telephone Encounter (Signed)
Medication was sent in   

## 2019-08-26 NOTE — Telephone Encounter (Signed)
Doxycycline for 1 week. 

## 2019-09-02 DIAGNOSIS — J449 Chronic obstructive pulmonary disease, unspecified: Secondary | ICD-10-CM | POA: Diagnosis not present

## 2019-09-03 ENCOUNTER — Telehealth: Payer: Self-pay

## 2019-09-03 NOTE — Telephone Encounter (Signed)
Spoke with patient and following up on oxygen with [employer], she was wanting a lighter tank but she did not qualify to breathe on her own with the light weight and due to the liter flow she was unable to tolerate the one she wanted, advised pt I would document that in her chart. Kristen Schroeder

## 2019-09-14 NOTE — Progress Notes (Signed)
Patient: Kristen Schroeder Female    DOB: 24-Jun-1941   78 y.o.   MRN: [id number] Visit Date: 09/15/2019  Today's Provider: Wilhemena Durie, MD   Chief Complaint  Patient presents with  . Hypothyroidism  . Hypertension  . Hyperlipidemia  . COPD   Subjective:     HPI  Patient is feeling fairly well recently.  She does complain of chronic constipation recently. She is feeling weak but this is not a new problem.  She wears oxygen 24 hours a day.  Essential (primary) hypertension From 07/09/2019-Controlled on enalapril. BP Readings from Last 3 Encounters:  09/15/19 132/72  07/09/19 136/62  06/23/19 (!) 147/67   Adult hypothyroidism From 12/08/2018-labs checked, stable.   Lab Results  Component Value Date   TSH 1.010 12/08/2018    Type 2 diabetes mellitus without complication, without long-term current use of insulin ([place]) From 03/09/2019-A1c is 5.3. Prediabetic now.  Neuropathy From 07/09/2019-Continue gabapentin. Patient reports that she still has trouble with this.   Chronic obstructive pulmonary disease, unspecified COPD type ([place]) From 07/09/2019-Per pulmonary.  Allergies  Allergen Reactions  . Codeine Nausea And Vomiting and Nausea Only     Current Outpatient Medications:  .  albuterol (PROVENTIL HFA;VENTOLIN HFA) 108 (90 Base) MCG/ACT inhaler, Inhale 2 puff every 4-6 hrs as needed for SOB, Disp: 1 Inhaler, Rfl: 3 .  arformoterol (BROVANA) 15 MCG/2ML NEBU, Take 2 mLs (15 mcg total) by nebulization 2 (two) times daily., Disp: 120 mL, Rfl: 11 .  aspirin EC 81 MG tablet, Take 81 mg by mouth daily., Disp: , Rfl:  .  blood glucose meter kit and supplies KIT, Dispense based on patient and insurance preference. Use up to four times daily as directed. (FOR ICD-9 250.00, 250.01)., Disp: 1 each, Rfl: 0 .  Blood Glucose Monitoring Suppl (ONE TOUCH ULTRA 2) w/Device KIT, 1 each by Does not apply route daily., Disp: 1 each, Rfl: 0 .  enalapril (VASOTEC) 20 MG tablet,  Take 1 tablet (20 mg total) by mouth daily., Disp: 90 tablet, Rfl: 3 .  fluticasone (FLONASE) 50 MCG/ACT nasal spray, Place 1 spray into both nostrils daily as needed. , Disp: , Rfl:  .  furosemide (LASIX) 20 MG tablet, TAKE 1 TABLET BY MOUTH EVERY DAY, Disp: 90 tablet, Rfl: 1 .  gabapentin (NEURONTIN) 300 MG capsule, TAKE 2 CAPSULES IN THE MORNING, 1 CAPSULE IN THE AFTERNOON AND 2 CAPSULES IN THE EVENING, Disp: 450 capsule, Rfl: 3 .  HYDROcodone-acetaminophen (NORCO) 10-325 MG tablet, Take by mouth., Disp: , Rfl:  .  ipratropium-albuterol (DUONEB) 0.5-2.5 (3) MG/3ML SOLN, TAKE 3 MLS BY NEBULIZATION EVERY 6 (SIX) HOURS AS NEEDED., Disp: 360 mL, Rfl: 1 .  isosorbide mononitrate (IMDUR) 30 MG 24 hr tablet, Take 30 mg by mouth daily as needed (high bp)., Disp: , Rfl:  .  levothyroxine (SYNTHROID, LEVOTHROID) 125 MCG tablet, TAKE 1 TABLET BY MOUTH DAILY, Disp: 90 tablet, Rfl: 3 .  loratadine (CLARITIN) 10 MG tablet, TAKE 1 TABLET (10 MG TOTAL) BY MOUTH DAILY., Disp: 90 tablet, Rfl: 3 .  LORazepam (ATIVAN) 0.5 MG tablet, Take 1 tablet (0.5 mg total) by mouth 2 (two) times daily as needed for anxiety., Disp: 180 tablet, Rfl: 3 .  metFORMIN (GLUCOPHAGE) 1000 MG tablet, TAKE 1 TABLET BY MOUTH TWICE A DAY (Patient taking differently: Take 1,000 mg by mouth daily with breakfast. ), Disp: 180 tablet, Rfl: 3 .  mometasone-formoterol (DULERA) 100-5 MCG/ACT AERO, Inhale 2 puffs  into the lungs 2 (two) times daily., Disp: 6 Inhaler, Rfl: 3 .  MULTIPLE VITAMIN PO, Take 1 tablet by mouth daily. , Disp: , Rfl:  .  omeprazole (PRILOSEC) 40 MG capsule, Take 1 capsule (40 mg total) by mouth daily., Disp: 90 capsule, Rfl: 1 .  ondansetron (ZOFRAN ODT) 4 MG disintegrating tablet, Take 1 tablet (4 mg total) by mouth every 8 (eight) hours as needed for nausea or vomiting., Disp: 10 tablet, Rfl: 0 .  ONETOUCH ULTRA test strip, USE 1 STRIP TO TEST GLUCOSE TWICE DAILY AS NEEDED, Disp: 100 strip, Rfl: 12 .   oxyCODONE-acetaminophen (PERCOCET/ROXICET) 5-325 MG tablet, Take 1 tablet by mouth every 6 (six) hours as needed for severe pain., Disp: 20 tablet, Rfl: 0 .  OXYGEN, Inhale 4 L into the lungs., Disp: , Rfl:  .  PARoxetine (PAXIL) 10 MG tablet, Take 1 tablet (10 mg total) by mouth at bedtime., Disp: 90 tablet, Rfl: 3 .  pravastatin (PRAVACHOL) 10 MG tablet, TAKE 1 TABLET (10 MG TOTAL) BY MOUTH DAILY., Disp: 90 tablet, Rfl: 3 .  roflumilast (DALIRESP) 500 MCG TABS tablet, Take by mouth., Disp: , Rfl:  .  tiotropium (SPIRIVA) 18 MCG inhalation capsule, Place into inhaler and inhale., Disp: , Rfl:  .  azithromycin (ZITHROMAX Z-PAK) 250 MG tablet, 2 tabs PO on day 1, then 1 tab PO daily for 4 days, Disp: 6 each, Rfl: 2 .  benzonatate (TESSALON) 200 MG capsule, Take 1 capsule (200 mg total) by mouth 3 (three) times daily as needed., Disp: 30 capsule, Rfl: 0 .  ondansetron (ZOFRAN-ODT) 4 MG disintegrating tablet, TAKE 1 TABLET BY MOUTH EVERY 8 HOURS AS NEEDED FOR NAUSEA AND VOMITING, Disp: 20 tablet, Rfl: 3 .  predniSONE (STERAPRED UNI-PAK 21 TAB) 10 MG (21) TBPK tablet, Taper as directed., Disp: 21 tablet, Rfl: 0  Review of Systems  Constitutional: Negative for appetite change, chills, fatigue and fever.  HENT: Negative.   Eyes: Negative.   Respiratory: Negative for chest tightness and shortness of breath.   Cardiovascular: Negative for chest pain and palpitations.  Gastrointestinal: Positive for constipation. Negative for abdominal pain, nausea and vomiting.  Endocrine: Negative.   Allergic/Immunologic: Negative.   Neurological: Negative for dizziness and weakness.  Hematological: Negative.   Psychiatric/Behavioral: Negative.     Social History   Tobacco Use  . Smoking status: Former Smoker    Packs/day: 0.50    Years: 15.00    Pack years: 7.50  . Smokeless tobacco: Never Used  . Tobacco comment: 30-40 years ago  Substance Use Topics  . Alcohol use: No    Alcohol/week: 0.0 standard  drinks      Objective:   BP 132/72   Pulse 84   Temp 97.6 F (36.4 C)   Resp 20   Wt 223 lb (101.2 kg)   SpO2 97% Comment: with 2L of O2  BMI 35.99 kg/m  Vitals:   09/15/19 1102  BP: 132/72  Pulse: 84  Resp: 20  Temp: 97.6 F (36.4 C)  SpO2: 97%  Weight: 223 lb (101.2 kg)  Body mass index is 35.99 kg/m.   Physical Exam Vitals signs reviewed.  Constitutional:      Appearance: She is obese.  HENT:     Head: Normocephalic and atraumatic.     Right Ear: External ear normal.     Left Ear: External ear normal.  Eyes:     General: No scleral icterus.    Conjunctiva/sclera: Conjunctivae normal.  Neck:  Vascular: No carotid bruit.  Cardiovascular:     Rate and Rhythm: Normal rate and regular rhythm.     Pulses: Normal pulses.     Heart sounds: Normal heart sounds.  Pulmonary:     Effort: Pulmonary effort is normal.     Breath sounds: Normal breath sounds.  Abdominal:     Palpations: Abdomen is soft. There is no mass.  Lymphadenopathy:     Cervical: No cervical adenopathy.  Skin:    General: Skin is warm and dry.  Neurological:     General: No focal deficit present.     Mental Status: She is alert and oriented to person, place, and time.  Psychiatric:        Mood and Affect: Mood normal.        Behavior: Behavior normal.        Thought Content: Thought content normal.        Judgment: Judgment normal.      Results for orders placed or performed in visit on 09/15/19  POCT HgB A1C  Result Value Ref Range   Hemoglobin A1C 5.3 4.0 - 5.6 %   HbA1c POC (<> result, manual entry)     HbA1c, POC (prediabetic range)     HbA1c, POC (controlled diabetic range)         Assessment & Plan    1. Essential (primary) hypertension Controlled on Vasotec  2. Adult hypothyroidism Synthroid.  3. Type 2 diabetes mellitus without complication, without long-term current use of insulin (HCC) Excellent control with A1c of 5.3 today.  Follow-up in 6 to 12 months.  On  pravastatin. - POCT HgB A1C  4. Neuropathy  - Ambulatory referral to Home Health  5. Chronic obstructive pulmonary disease, unspecified COPD type ([place]) Followed by pulmonary.  On 24-hour oxygen. - Ambulatory referral to [facility]  6. At high risk for falls Refer to physical therapy to lower fall risk. - Ambulatory referral to Home Health  7. Weakness  - Ambulatory referral to Home Health  8. Constipation, unspecified constipation type GlycoLax daily.More than 50% 25 minute visit spent in counseling or coordination of care      Wilhemena Durie, MD  [facility]

## 2019-09-15 ENCOUNTER — Ambulatory Visit (INDEPENDENT_AMBULATORY_CARE_PROVIDER_SITE_OTHER): Payer: Medicare Other | Admitting: Family Medicine

## 2019-09-15 ENCOUNTER — Encounter: Payer: Self-pay | Admitting: Family Medicine

## 2019-09-15 ENCOUNTER — Other Ambulatory Visit: Payer: Self-pay

## 2019-09-15 VITALS — BP 132/72 | HR 84 | Temp 97.6°F | Resp 20 | Wt 223.0 lb

## 2019-09-15 DIAGNOSIS — J449 Chronic obstructive pulmonary disease, unspecified: Secondary | ICD-10-CM | POA: Diagnosis not present

## 2019-09-15 DIAGNOSIS — E119 Type 2 diabetes mellitus without complications: Secondary | ICD-10-CM | POA: Diagnosis not present

## 2019-09-15 DIAGNOSIS — K5900 Constipation, unspecified: Secondary | ICD-10-CM

## 2019-09-15 DIAGNOSIS — Z9181 History of falling: Secondary | ICD-10-CM

## 2019-09-15 DIAGNOSIS — E039 Hypothyroidism, unspecified: Secondary | ICD-10-CM

## 2019-09-15 DIAGNOSIS — I10 Essential (primary) hypertension: Secondary | ICD-10-CM | POA: Diagnosis not present

## 2019-09-15 DIAGNOSIS — R531 Weakness: Secondary | ICD-10-CM

## 2019-09-15 DIAGNOSIS — G629 Polyneuropathy, unspecified: Secondary | ICD-10-CM | POA: Diagnosis not present

## 2019-09-15 LAB — POCT GLYCOSYLATED HEMOGLOBIN (HGB A1C): Hemoglobin A1C: 5.3 % (ref 4.0–5.6)

## 2019-09-15 NOTE — Patient Instructions (Signed)
Try Glycolax daily to help with constipation.

## 2019-09-21 ENCOUNTER — Other Ambulatory Visit: Payer: Self-pay | Admitting: Family Medicine

## 2019-09-21 ENCOUNTER — Ambulatory Visit: Payer: Self-pay | Admitting: Pharmacist

## 2019-09-21 DIAGNOSIS — R531 Weakness: Secondary | ICD-10-CM

## 2019-09-21 DIAGNOSIS — J449 Chronic obstructive pulmonary disease, unspecified: Secondary | ICD-10-CM

## 2019-09-21 NOTE — Telephone Encounter (Signed)
Requested medication (s) are due for refill today: yes  Requested medication (s) are on the active medication list: yes  Last refill:  07/08/19#6 with 2 refills  Future visit scheduled: yes  Notes to clinic:  Unable to refill medication per protocol    Requested Prescriptions  Pending Prescriptions Disp Refills   azithromycin (ZITHROMAX) 250 MG tablet [Pharmacy Med Name: AZITHROMYCIN 250 MG DOSE PACK] 6 tablet 2    Sig: TAKE 2 TABLETS BY MOUTH TODAY, THEN TAKE 1 TABLET DAILY FOR 4 DAYS     Off-Protocol Failed - 09/21/2019  9:43 AM      Failed - Medication not assigned to a protocol, review manually.      Passed - Valid encounter within last 12 months    Recent Outpatient Visits          6 days ago Essential (primary) hypertension   [facility] Jerrol Banana., MD   2 months ago Essential (primary) hypertension   [facility] Jerrol Banana., MD   3 months ago COVID-19 virus infection   [facility] Rosanna Randy, Retia Passe., MD   6 months ago Type 2 diabetes mellitus without complication, without long-term current use of insulin [facility])   [facility] Jerrol Banana., MD   9 months ago Encounter for annual physical examination excluding gynecological examination in a patient older than 17 years   [facility] Jerrol Banana., MD      Future Appointments            In 2 months  [facility], [place]   In 2 months Jerrol Banana., MD [facility], [place]   In 5 months Jerrol Banana., MD [facility], [place]

## 2019-09-21 NOTE — Chronic Care Management (AMB) (Signed)
  Chronic Care Management   Note  09/21/2019 Name: Kristen Schroeder MRN: [id number] DOB: Apr 20, 1941  Contacted patient for medication assistance plan for 2021. Scheduled appointment for Thursday, November 19 at 10 am  Follow up plan: Telephone follow up appointment with care management team member scheduled for: 09/24/19  Ruben Reason, PharmD Clinical Pharmacist [place] [phone]

## 2019-09-21 NOTE — Telephone Encounter (Signed)
Please review

## 2019-09-22 ENCOUNTER — Telehealth: Payer: Self-pay | Admitting: Family Medicine

## 2019-09-22 DIAGNOSIS — J449 Chronic obstructive pulmonary disease, unspecified: Secondary | ICD-10-CM | POA: Diagnosis not present

## 2019-09-22 DIAGNOSIS — M1990 Unspecified osteoarthritis, unspecified site: Secondary | ICD-10-CM | POA: Diagnosis not present

## 2019-09-22 DIAGNOSIS — D649 Anemia, unspecified: Secondary | ICD-10-CM | POA: Diagnosis not present

## 2019-09-22 DIAGNOSIS — I5032 Chronic diastolic (congestive) heart failure: Secondary | ICD-10-CM | POA: Diagnosis not present

## 2019-09-22 DIAGNOSIS — G4730 Sleep apnea, unspecified: Secondary | ICD-10-CM | POA: Diagnosis not present

## 2019-09-22 DIAGNOSIS — M545 Low back pain: Secondary | ICD-10-CM | POA: Diagnosis not present

## 2019-09-22 DIAGNOSIS — E039 Hypothyroidism, unspecified: Secondary | ICD-10-CM | POA: Diagnosis not present

## 2019-09-22 DIAGNOSIS — E7849 Other hyperlipidemia: Secondary | ICD-10-CM | POA: Diagnosis not present

## 2019-09-22 DIAGNOSIS — E1140 Type 2 diabetes mellitus with diabetic neuropathy, unspecified: Secondary | ICD-10-CM | POA: Diagnosis not present

## 2019-09-22 DIAGNOSIS — I110 Hypertensive heart disease with heart failure: Secondary | ICD-10-CM | POA: Diagnosis not present

## 2019-09-22 DIAGNOSIS — J8410 Pulmonary fibrosis, unspecified: Secondary | ICD-10-CM | POA: Diagnosis not present

## 2019-09-22 DIAGNOSIS — Z8673 Personal history of transient ischemic attack (TIA), and cerebral infarction without residual deficits: Secondary | ICD-10-CM | POA: Diagnosis not present

## 2019-09-22 DIAGNOSIS — I272 Pulmonary hypertension, unspecified: Secondary | ICD-10-CM | POA: Diagnosis not present

## 2019-09-22 NOTE — Telephone Encounter (Signed)
Home Health Verbal Orders - Caller/Agency: Marzetta Board with Advanced Callback Number: [phone] Requesting OT/PT/Skilled Nursing/Social Work/Speech Therapy: needing verbals for PT  Frequency: 2 week 2 1 week 2

## 2019-09-25 DIAGNOSIS — M1990 Unspecified osteoarthritis, unspecified site: Secondary | ICD-10-CM | POA: Diagnosis not present

## 2019-09-25 DIAGNOSIS — E7849 Other hyperlipidemia: Secondary | ICD-10-CM | POA: Diagnosis not present

## 2019-09-25 DIAGNOSIS — Z8673 Personal history of transient ischemic attack (TIA), and cerebral infarction without residual deficits: Secondary | ICD-10-CM | POA: Diagnosis not present

## 2019-09-25 DIAGNOSIS — E1140 Type 2 diabetes mellitus with diabetic neuropathy, unspecified: Secondary | ICD-10-CM | POA: Diagnosis not present

## 2019-09-25 DIAGNOSIS — J449 Chronic obstructive pulmonary disease, unspecified: Secondary | ICD-10-CM | POA: Diagnosis not present

## 2019-09-25 DIAGNOSIS — D649 Anemia, unspecified: Secondary | ICD-10-CM | POA: Diagnosis not present

## 2019-09-25 DIAGNOSIS — G4730 Sleep apnea, unspecified: Secondary | ICD-10-CM | POA: Diagnosis not present

## 2019-09-25 DIAGNOSIS — E039 Hypothyroidism, unspecified: Secondary | ICD-10-CM | POA: Diagnosis not present

## 2019-09-25 DIAGNOSIS — I272 Pulmonary hypertension, unspecified: Secondary | ICD-10-CM | POA: Diagnosis not present

## 2019-09-25 DIAGNOSIS — J8410 Pulmonary fibrosis, unspecified: Secondary | ICD-10-CM | POA: Diagnosis not present

## 2019-09-25 DIAGNOSIS — I5032 Chronic diastolic (congestive) heart failure: Secondary | ICD-10-CM | POA: Diagnosis not present

## 2019-09-25 DIAGNOSIS — M545 Low back pain: Secondary | ICD-10-CM | POA: Diagnosis not present

## 2019-09-25 DIAGNOSIS — I110 Hypertensive heart disease with heart failure: Secondary | ICD-10-CM | POA: Diagnosis not present

## 2019-09-28 DIAGNOSIS — E039 Hypothyroidism, unspecified: Secondary | ICD-10-CM | POA: Diagnosis not present

## 2019-09-28 DIAGNOSIS — J8410 Pulmonary fibrosis, unspecified: Secondary | ICD-10-CM | POA: Diagnosis not present

## 2019-09-28 DIAGNOSIS — D649 Anemia, unspecified: Secondary | ICD-10-CM | POA: Diagnosis not present

## 2019-09-28 DIAGNOSIS — I272 Pulmonary hypertension, unspecified: Secondary | ICD-10-CM | POA: Diagnosis not present

## 2019-09-28 DIAGNOSIS — I110 Hypertensive heart disease with heart failure: Secondary | ICD-10-CM | POA: Diagnosis not present

## 2019-09-28 DIAGNOSIS — Z8673 Personal history of transient ischemic attack (TIA), and cerebral infarction without residual deficits: Secondary | ICD-10-CM | POA: Diagnosis not present

## 2019-09-28 DIAGNOSIS — M545 Low back pain: Secondary | ICD-10-CM | POA: Diagnosis not present

## 2019-09-28 DIAGNOSIS — E7849 Other hyperlipidemia: Secondary | ICD-10-CM | POA: Diagnosis not present

## 2019-09-28 DIAGNOSIS — E1140 Type 2 diabetes mellitus with diabetic neuropathy, unspecified: Secondary | ICD-10-CM | POA: Diagnosis not present

## 2019-09-28 DIAGNOSIS — G4730 Sleep apnea, unspecified: Secondary | ICD-10-CM | POA: Diagnosis not present

## 2019-09-28 DIAGNOSIS — I5032 Chronic diastolic (congestive) heart failure: Secondary | ICD-10-CM | POA: Diagnosis not present

## 2019-09-28 DIAGNOSIS — J449 Chronic obstructive pulmonary disease, unspecified: Secondary | ICD-10-CM | POA: Diagnosis not present

## 2019-09-28 DIAGNOSIS — M1990 Unspecified osteoarthritis, unspecified site: Secondary | ICD-10-CM | POA: Diagnosis not present

## 2019-09-30 DIAGNOSIS — E1140 Type 2 diabetes mellitus with diabetic neuropathy, unspecified: Secondary | ICD-10-CM | POA: Diagnosis not present

## 2019-09-30 DIAGNOSIS — J8410 Pulmonary fibrosis, unspecified: Secondary | ICD-10-CM | POA: Diagnosis not present

## 2019-09-30 DIAGNOSIS — E039 Hypothyroidism, unspecified: Secondary | ICD-10-CM | POA: Diagnosis not present

## 2019-09-30 DIAGNOSIS — M545 Low back pain: Secondary | ICD-10-CM | POA: Diagnosis not present

## 2019-09-30 DIAGNOSIS — I5032 Chronic diastolic (congestive) heart failure: Secondary | ICD-10-CM | POA: Diagnosis not present

## 2019-09-30 DIAGNOSIS — D649 Anemia, unspecified: Secondary | ICD-10-CM | POA: Diagnosis not present

## 2019-09-30 DIAGNOSIS — Z8673 Personal history of transient ischemic attack (TIA), and cerebral infarction without residual deficits: Secondary | ICD-10-CM | POA: Diagnosis not present

## 2019-09-30 DIAGNOSIS — M1990 Unspecified osteoarthritis, unspecified site: Secondary | ICD-10-CM | POA: Diagnosis not present

## 2019-09-30 DIAGNOSIS — I272 Pulmonary hypertension, unspecified: Secondary | ICD-10-CM | POA: Diagnosis not present

## 2019-09-30 DIAGNOSIS — G4730 Sleep apnea, unspecified: Secondary | ICD-10-CM | POA: Diagnosis not present

## 2019-09-30 DIAGNOSIS — J449 Chronic obstructive pulmonary disease, unspecified: Secondary | ICD-10-CM | POA: Diagnosis not present

## 2019-09-30 DIAGNOSIS — I110 Hypertensive heart disease with heart failure: Secondary | ICD-10-CM | POA: Diagnosis not present

## 2019-09-30 DIAGNOSIS — E7849 Other hyperlipidemia: Secondary | ICD-10-CM | POA: Diagnosis not present

## 2019-10-03 DIAGNOSIS — J449 Chronic obstructive pulmonary disease, unspecified: Secondary | ICD-10-CM | POA: Diagnosis not present

## 2019-10-06 DIAGNOSIS — M545 Low back pain: Secondary | ICD-10-CM | POA: Diagnosis not present

## 2019-10-06 DIAGNOSIS — E7849 Other hyperlipidemia: Secondary | ICD-10-CM | POA: Diagnosis not present

## 2019-10-06 DIAGNOSIS — Z8673 Personal history of transient ischemic attack (TIA), and cerebral infarction without residual deficits: Secondary | ICD-10-CM | POA: Diagnosis not present

## 2019-10-06 DIAGNOSIS — E039 Hypothyroidism, unspecified: Secondary | ICD-10-CM | POA: Diagnosis not present

## 2019-10-06 DIAGNOSIS — J8410 Pulmonary fibrosis, unspecified: Secondary | ICD-10-CM | POA: Diagnosis not present

## 2019-10-06 DIAGNOSIS — I272 Pulmonary hypertension, unspecified: Secondary | ICD-10-CM | POA: Diagnosis not present

## 2019-10-06 DIAGNOSIS — G4730 Sleep apnea, unspecified: Secondary | ICD-10-CM | POA: Diagnosis not present

## 2019-10-06 DIAGNOSIS — M1990 Unspecified osteoarthritis, unspecified site: Secondary | ICD-10-CM | POA: Diagnosis not present

## 2019-10-06 DIAGNOSIS — I5032 Chronic diastolic (congestive) heart failure: Secondary | ICD-10-CM | POA: Diagnosis not present

## 2019-10-06 DIAGNOSIS — I110 Hypertensive heart disease with heart failure: Secondary | ICD-10-CM | POA: Diagnosis not present

## 2019-10-06 DIAGNOSIS — J449 Chronic obstructive pulmonary disease, unspecified: Secondary | ICD-10-CM | POA: Diagnosis not present

## 2019-10-06 DIAGNOSIS — E1140 Type 2 diabetes mellitus with diabetic neuropathy, unspecified: Secondary | ICD-10-CM | POA: Diagnosis not present

## 2019-10-06 DIAGNOSIS — D649 Anemia, unspecified: Secondary | ICD-10-CM | POA: Diagnosis not present

## 2019-10-14 DIAGNOSIS — M1990 Unspecified osteoarthritis, unspecified site: Secondary | ICD-10-CM | POA: Diagnosis not present

## 2019-10-14 DIAGNOSIS — I272 Pulmonary hypertension, unspecified: Secondary | ICD-10-CM | POA: Diagnosis not present

## 2019-10-14 DIAGNOSIS — J8410 Pulmonary fibrosis, unspecified: Secondary | ICD-10-CM | POA: Diagnosis not present

## 2019-10-14 DIAGNOSIS — J449 Chronic obstructive pulmonary disease, unspecified: Secondary | ICD-10-CM | POA: Diagnosis not present

## 2019-10-14 DIAGNOSIS — G4730 Sleep apnea, unspecified: Secondary | ICD-10-CM | POA: Diagnosis not present

## 2019-10-14 DIAGNOSIS — E1140 Type 2 diabetes mellitus with diabetic neuropathy, unspecified: Secondary | ICD-10-CM | POA: Diagnosis not present

## 2019-10-14 DIAGNOSIS — E7849 Other hyperlipidemia: Secondary | ICD-10-CM | POA: Diagnosis not present

## 2019-10-14 DIAGNOSIS — Z8673 Personal history of transient ischemic attack (TIA), and cerebral infarction without residual deficits: Secondary | ICD-10-CM | POA: Diagnosis not present

## 2019-10-14 DIAGNOSIS — M545 Low back pain: Secondary | ICD-10-CM | POA: Diagnosis not present

## 2019-10-14 DIAGNOSIS — I110 Hypertensive heart disease with heart failure: Secondary | ICD-10-CM | POA: Diagnosis not present

## 2019-10-14 DIAGNOSIS — E039 Hypothyroidism, unspecified: Secondary | ICD-10-CM | POA: Diagnosis not present

## 2019-10-14 DIAGNOSIS — I5032 Chronic diastolic (congestive) heart failure: Secondary | ICD-10-CM | POA: Diagnosis not present

## 2019-10-14 DIAGNOSIS — D649 Anemia, unspecified: Secondary | ICD-10-CM | POA: Diagnosis not present

## 2019-10-15 DIAGNOSIS — J449 Chronic obstructive pulmonary disease, unspecified: Secondary | ICD-10-CM | POA: Diagnosis not present

## 2019-10-20 ENCOUNTER — Other Ambulatory Visit: Payer: Self-pay | Admitting: Family Medicine

## 2019-10-20 NOTE — Telephone Encounter (Signed)
Requested medication (s) are due for refill today: yes  Requested medication (s) are on the active medication list: yes  Last refill:  08/14/2019  Future visit scheduled: yes  Notes to clinic:  no assigned protocol    Requested Prescriptions  Pending Prescriptions Disp Refills   azithromycin (ZITHROMAX) 250 MG tablet [Pharmacy Med Name: AZITHROMYCIN 250 MG DOSE PACK] 6 tablet 2    Sig: TAKE 2 TABLETS BY MOUTH TODAY, THEN TAKE 1 TABLET DAILY FOR 4 DAYS      Off-Protocol Failed - 10/20/2019 11:36 AM      Failed - Medication not assigned to a protocol, review manually.      Passed - Valid encounter within last 12 months    Recent Outpatient Visits           1 month ago Essential (primary) hypertension   [facility] Jerrol Banana., MD   3 months ago Essential (primary) hypertension   [facility] Jerrol Banana., MD   4 months ago COVID-19 virus infection   [facility] Rosanna Randy, Retia Passe., MD   7 months ago Type 2 diabetes mellitus without complication, without long-term current use of insulin [facility])   [facility] Jerrol Banana., MD   10 months ago Encounter for annual physical examination excluding gynecological examination in a patient older than 17 years   [facility] Rosanna Randy, Retia Passe., MD       Future Appointments             In 1 month  [facility], [place]   In 1 month Jerrol Banana., MD [facility], [place]   In 4 months Jerrol Banana., MD [facility], [place]

## 2019-10-23 ENCOUNTER — Other Ambulatory Visit: Payer: Self-pay

## 2019-10-23 ENCOUNTER — Ambulatory Visit (INDEPENDENT_AMBULATORY_CARE_PROVIDER_SITE_OTHER): Payer: Medicare Other | Admitting: Family Medicine

## 2019-10-23 ENCOUNTER — Encounter: Payer: Self-pay | Admitting: Family Medicine

## 2019-10-23 VITALS — Temp 97.3°F

## 2019-10-23 DIAGNOSIS — J069 Acute upper respiratory infection, unspecified: Secondary | ICD-10-CM

## 2019-10-23 NOTE — Patient Instructions (Signed)

## 2019-10-23 NOTE — Progress Notes (Signed)
Patient: Kristen Schroeder Female    DOB: [birth year]   78 y.o.   MRN: [id number] Visit Date: 10/23/2019  Today's Provider: Lavon Paganini, MD   No chief complaint on file.  Subjective:     URI  This is a recurrent problem. The problem has been gradually worsening. There has been no fever. Associated symptoms include congestion and sneezing. Pertinent negatives include no abdominal pain, ear pain, headaches, rhinorrhea, sinus pain or sore throat.   Was worried that she was coming down with a cold.  States that she typically gets a Zpack for this and finds it helpful.  Has COPD and is on home O2. SOB is at baseline.  No productive cough.  Had [place] in July.  Virtual Visit via Telephone Note  I connected with [place] on 10/23/19 at 10:40 AM EST by telephone and verified that I am speaking with the correct person using two identifiers.  Location: Patient location: home Provider location: [facility] Persons involved in the visit: patient, provider   I discussed the limitations, risks, security and privacy concerns of performing an evaluation and management service by telephone and the availability of in person appointments. I also discussed with the patient that there may be a patient responsible charge related to this service. The patient expressed understanding and agreed to proceed.    Allergies  Allergen Reactions  . Codeine Nausea And Vomiting and Nausea Only     Current Outpatient Medications:  .  albuterol (PROVENTIL HFA;VENTOLIN HFA) 108 (90 Base) MCG/ACT inhaler, Inhale 2 puff every 4-6 hrs as needed for SOB, Disp: 1 Inhaler, Rfl: 3 .  arformoterol (BROVANA) 15 MCG/2ML NEBU, Take 2 mLs (15 mcg total) by nebulization 2 (two) times daily., Disp: 120 mL, Rfl: 11 .  aspirin EC 81 MG tablet, Take 81 mg by mouth daily., Disp: , Rfl:  .  azithromycin (ZITHROMAX Z-PAK) 250 MG tablet, 2 tabs PO on day 1, then 1 tab PO daily for 4 days, Disp: 6 each,  Rfl: 2 .  benzonatate (TESSALON) 200 MG capsule, Take 1 capsule (200 mg total) by mouth 3 (three) times daily as needed., Disp: 30 capsule, Rfl: 0 .  blood glucose meter kit and supplies KIT, Dispense based on patient and insurance preference. Use up to four times daily as directed. (FOR ICD-9 250.00, 250.01)., Disp: 1 each, Rfl: 0 .  Blood Glucose Monitoring Suppl (ONE TOUCH ULTRA 2) w/Device KIT, 1 each by Does not apply route daily., Disp: 1 each, Rfl: 0 .  enalapril (VASOTEC) 20 MG tablet, Take 1 tablet (20 mg total) by mouth daily., Disp: 90 tablet, Rfl: 3 .  fluticasone (FLONASE) 50 MCG/ACT nasal spray, Place 1 spray into both nostrils daily as needed. , Disp: , Rfl:  .  furosemide (LASIX) 20 MG tablet, TAKE 1 TABLET BY MOUTH EVERY DAY, Disp: 90 tablet, Rfl: 1 .  gabapentin (NEURONTIN) 300 MG capsule, TAKE 2 CAPSULES IN THE MORNING, 1 CAPSULE IN THE AFTERNOON AND 2 CAPSULES IN THE EVENING, Disp: 450 capsule, Rfl: 3 .  HYDROcodone-acetaminophen (NORCO) 10-325 MG tablet, Take by mouth., Disp: , Rfl:  .  ipratropium-albuterol (DUONEB) 0.5-2.5 (3) MG/3ML SOLN, TAKE 3 MLS BY NEBULIZATION EVERY 6 (SIX) HOURS AS NEEDED., Disp: 360 mL, Rfl: 1 .  isosorbide mononitrate (IMDUR) 30 MG 24 hr tablet, Take 30 mg by mouth daily as needed (high bp)., Disp: , Rfl:  .  levothyroxine (SYNTHROID, LEVOTHROID) 125 MCG tablet, TAKE  1 TABLET BY MOUTH DAILY, Disp: 90 tablet, Rfl: 3 .  loratadine (CLARITIN) 10 MG tablet, TAKE 1 TABLET (10 MG TOTAL) BY MOUTH DAILY., Disp: 90 tablet, Rfl: 3 .  LORazepam (ATIVAN) 0.5 MG tablet, Take 1 tablet (0.5 mg total) by mouth 2 (two) times daily as needed for anxiety., Disp: 180 tablet, Rfl: 3 .  metFORMIN (GLUCOPHAGE) 1000 MG tablet, TAKE 1 TABLET BY MOUTH TWICE A DAY (Patient taking differently: Take 1,000 mg by mouth daily with breakfast. ), Disp: 180 tablet, Rfl: 3 .  mometasone-formoterol (DULERA) 100-5 MCG/ACT AERO, Inhale 2 puffs into the lungs 2 (two) times daily., Disp: 6  Inhaler, Rfl: 3 .  MULTIPLE VITAMIN PO, Take 1 tablet by mouth daily. , Disp: , Rfl:  .  omeprazole (PRILOSEC) 40 MG capsule, Take 1 capsule (40 mg total) by mouth daily., Disp: 90 capsule, Rfl: 1 .  ondansetron (ZOFRAN ODT) 4 MG disintegrating tablet, Take 1 tablet (4 mg total) by mouth every 8 (eight) hours as needed for nausea or vomiting., Disp: 10 tablet, Rfl: 0 .  ondansetron (ZOFRAN-ODT) 4 MG disintegrating tablet, TAKE 1 TABLET BY MOUTH EVERY 8 HOURS AS NEEDED FOR NAUSEA AND VOMITING, Disp: 20 tablet, Rfl: 3 .  ONETOUCH ULTRA test strip, USE 1 STRIP TO TEST GLUCOSE TWICE DAILY AS NEEDED, Disp: 100 strip, Rfl: 12 .  oxyCODONE-acetaminophen (PERCOCET/ROXICET) 5-325 MG tablet, Take 1 tablet by mouth every 6 (six) hours as needed for severe pain., Disp: 20 tablet, Rfl: 0 .  OXYGEN, Inhale 4 L into the lungs., Disp: , Rfl:  .  PARoxetine (PAXIL) 10 MG tablet, Take 1 tablet (10 mg total) by mouth at bedtime., Disp: 90 tablet, Rfl: 3 .  pravastatin (PRAVACHOL) 10 MG tablet, TAKE 1 TABLET (10 MG TOTAL) BY MOUTH DAILY., Disp: 90 tablet, Rfl: 3 .  predniSONE (STERAPRED UNI-PAK 21 TAB) 10 MG (21) TBPK tablet, Taper as directed., Disp: 21 tablet, Rfl: 0 .  roflumilast (DALIRESP) 500 MCG TABS tablet, Take by mouth., Disp: , Rfl:  .  tiotropium (SPIRIVA) 18 MCG inhalation capsule, Place into inhaler and inhale., Disp: , Rfl:   Review of Systems  Constitutional: Negative.   HENT: Positive for congestion, postnasal drip and sneezing. Negative for ear discharge, ear pain, rhinorrhea, sinus pressure, sinus pain, sore throat, trouble swallowing and voice change.   Respiratory: Negative.   Cardiovascular: Negative.   Gastrointestinal: Negative.  Negative for abdominal pain.  Allergic/Immunologic: Positive for environmental allergies.  Neurological: Negative for dizziness, light-headedness and headaches.    Social History   Tobacco Use  . Smoking status: Former Smoker    Packs/day: 0.50    Years:  15.00    Pack years: 7.50  . Smokeless tobacco: Never Used  . Tobacco comment: 30-40 years ago  Substance Use Topics  . Alcohol use: No    Alcohol/week: 0.0 standard drinks      Objective:   There were no vitals taken for this visit. There were no vitals filed for this visit.There is no height or weight on file to calculate BMI.   Physical Exam Speaking in full sentences in NAD  No results found for any visits on 10/23/19.     Assessment & Plan    Follow Up Instructions:    I discussed the assessment and treatment plan with the patient. The patient was provided an opportunity to ask questions and all were answered. The patient agreed with the plan and demonstrated an understanding of the instructions.   The  patient was advised to call back or seek an in-person evaluation if the symptoms worsen or if the condition fails to improve as anticipated.  1. Viral URI - symptomsc/w viral URI - no symptoms of strep pharyngitis, CAP, AOM, bacterial sinusitis, or other bacterial infection, or COPD exacerbation - discussed that COVID immunity seems to wane around 3 months and with respiratory symptoms, would recommend OP COVID testing - she declines and wants to wait and see how she feels next week - advised self-isolation while feeling sick - discussed that Z-pack is not indicated for viral URIs and that she is typically given this for COPD exacerbations, which she does not have currently - discussed symptomatic management, natural course, and return precautions     The entirety of the information documented in the History of Present Illness, Review of Systems and Physical Exam were personally obtained by me. Portions of this information were initially documented by Ashley Royalty , CMA and reviewed by me for thoroughness and accuracy.    Jaleya Pebley, Dionne Bucy, MD MPH [facility]

## 2019-10-26 ENCOUNTER — Telehealth: Payer: Self-pay

## 2019-10-26 NOTE — Telephone Encounter (Signed)
Please review below. KW 

## 2019-10-26 NOTE — Telephone Encounter (Signed)
  Copied from [place] [id number]. Topic: General - Other >> Oct 26, 2019 12:54 PM Greggory Keen D wrote: Reason for CRM: pt called wanting to know if the papers that she brought in early last week are ready to be picked up.  She said it was regarding inhalers that she uses.  She would like to pick these up today if possible. Dr. Rosanna Randy needed to sign these so she could mail them out,  CB#  [phone]

## 2019-10-27 ENCOUNTER — Other Ambulatory Visit: Payer: Self-pay | Admitting: Family Medicine

## 2019-11-02 DIAGNOSIS — J449 Chronic obstructive pulmonary disease, unspecified: Secondary | ICD-10-CM | POA: Diagnosis not present

## 2019-11-12 ENCOUNTER — Telehealth: Payer: Self-pay

## 2019-11-12 ENCOUNTER — Ambulatory Visit: Payer: Self-pay | Admitting: *Deleted

## 2019-11-12 MED ORDER — AZITHROMYCIN 250 MG PO TABS: ORAL_TABLET | ORAL | 2 refills | Status: DC

## 2019-11-12 NOTE — Telephone Encounter (Signed)
Confirmed pt appt for 11/17/2019. Kristen Schroeder 

## 2019-11-12 NOTE — Addendum Note (Signed)
Addended by: Janey Greaser D on: 11/12/2019 03:30 PM   Modules accepted: Orders

## 2019-11-12 NOTE — Telephone Encounter (Signed)
Summary: please advise   Pt said she starting sneezing today no other symptoms and the last time she started sneezing the md gave her zpak. [facility] [place]. Please advise.      Call to office regarding patient request- they are familiar with patient and will discuss request with PCP. Patient advised- she will expect call back. Reason for Disposition . [1] Known COPD or other severe lung disease (i.e., bronchiectasis, cystic fibrosis, lung surgery) AND [2] worsening symptoms (i.e., increased sputum purulence or amount, increased breathing difficulty  Answer Assessment - Initial Assessment Questions 1. ONSET: "When did the cough begin?"      Started this morning 2. SEVERITY: "How bad is the cough today?"      Cough up phlegm  3. RESPIRATORY DISTRESS: "Describe your breathing."      No problems with breathing 4. FEVER: "Do you have a fever?" If so, ask: "What is your temperature, how was it measured, and when did it start?"     No fever 5. SPUTUM: "Describe the color of your sputum" (clear, white, yellow, green)     Greenish/yellow in color 6. HEMOPTYSIS: "Are you coughing up any blood?" If so ask: "How much?" (flecks, streaks, tablespoons, etc.)     no 7. CARDIAC HISTORY: "Do you have any history of heart disease?" (e.g., heart attack, congestive heart failure)      no 8. LUNG HISTORY: "Do you have any history of lung disease?"  (e.g., pulmonary embolus, asthma, emphysema)     COPD 9. PE RISK FACTORS: "Do you have a history of blood clots?" (or: recent major surgery, recent prolonged travel, bedridden)     no 10. OTHER SYMPTOMS: "Do you have any other symptoms?" (e.g., runny nose, wheezing, chest pain)       Sneezing, post nasal drip 11. PREGNANCY: "Is there any chance you are pregnant?" "When was your last menstrual period?"       n/a 12. TRAVEL: "Have you traveled out of the country in the last month?" (e.g., travel history, exposures)       n/a  Protocols used: COUGH - ACUTE  PRODUCTIVE-A-AH

## 2019-11-15 DIAGNOSIS — J449 Chronic obstructive pulmonary disease, unspecified: Secondary | ICD-10-CM | POA: Diagnosis not present

## 2019-11-16 ENCOUNTER — Encounter: Payer: Self-pay | Admitting: Family Medicine

## 2019-11-16 ENCOUNTER — Ambulatory Visit (INDEPENDENT_AMBULATORY_CARE_PROVIDER_SITE_OTHER): Payer: Medicare Other | Admitting: Family Medicine

## 2019-11-16 VITALS — BP 119/62 | Temp 97.7°F

## 2019-11-16 DIAGNOSIS — J8410 Pulmonary fibrosis, unspecified: Secondary | ICD-10-CM

## 2019-11-16 DIAGNOSIS — R110 Nausea: Secondary | ICD-10-CM | POA: Diagnosis not present

## 2019-11-16 DIAGNOSIS — J410 Simple chronic bronchitis: Secondary | ICD-10-CM

## 2019-11-16 DIAGNOSIS — Z6833 Body mass index (BMI) 33.0-33.9, adult: Secondary | ICD-10-CM

## 2019-11-16 DIAGNOSIS — F419 Anxiety disorder, unspecified: Secondary | ICD-10-CM

## 2019-11-16 DIAGNOSIS — G4733 Obstructive sleep apnea (adult) (pediatric): Secondary | ICD-10-CM | POA: Diagnosis not present

## 2019-11-16 DIAGNOSIS — R42 Dizziness and giddiness: Secondary | ICD-10-CM

## 2019-11-16 DIAGNOSIS — E6609 Other obesity due to excess calories: Secondary | ICD-10-CM

## 2019-11-16 DIAGNOSIS — I10 Essential (primary) hypertension: Secondary | ICD-10-CM | POA: Diagnosis not present

## 2019-11-16 MED ORDER — ONDANSETRON 4 MG PO TBDP: 4.0000 mg | ORAL_TABLET | Freq: Three times a day (TID) | ORAL | 3 refills | Status: DC | PRN

## 2019-11-16 NOTE — Progress Notes (Signed)
Patient: Kristen Schroeder Female    DOB: 07-05-1941   79 y.o.   MRN: [id number] Visit Date: 11/16/2019  Today's Provider: Wilhemena Durie, MD   Chief Complaint  Patient presents with  . Nausea  . Dizziness   Subjective:     Virtual Visit via Telephone Note  I connected with [place] on 11/16/19 at 10:40 AM EST by telephone and verified that I am speaking with the correct person using two identifiers.  Location: Patient: Home Provider: Home   I discussed the limitations, risks, security and privacy concerns of performing an evaluation and management service by telephone and the availability of in person appointments. I also discussed with the patient that there may be a patient responsible charge related to this service. The patient expressed understanding and agreed to proceed.   HPI Patient has had recent issues with some dizziness when standing up quickly.  He admits to not drinking enough water.  She has not been checking her blood pressures. Blood sugars have been a little bit low recently.  Low for her means anything less than 100.  Hers are been in the 80s at times. She has been a little anxious during the Covid pandemic but doing well.  She and her husband both had Covid and recovered nicely. Allergies  Allergen Reactions  . Codeine Nausea And Vomiting and Nausea Only     Current Outpatient Medications:  .  albuterol (PROVENTIL HFA;VENTOLIN HFA) 108 (90 Base) MCG/ACT inhaler, Inhale 2 puff every 4-6 hrs as needed for SOB, Disp: 1 Inhaler, Rfl: 3 .  aspirin EC 81 MG tablet, Take 81 mg by mouth daily., Disp: , Rfl:  .  blood glucose meter kit and supplies KIT, Dispense based on patient and insurance preference. Use up to four times daily as directed. (FOR ICD-9 250.00, 250.01)., Disp: 1 each, Rfl: 0 .  Blood Glucose Monitoring Suppl (ONE TOUCH ULTRA 2) w/Device KIT, 1 each by Does not apply route daily., Disp: 1 each, Rfl: 0 .  enalapril (VASOTEC) 20 MG  tablet, Take 1 tablet (20 mg total) by mouth daily., Disp: 90 tablet, Rfl: 3 .  fluticasone (FLONASE) 50 MCG/ACT nasal spray, Place 1 spray into both nostrils daily as needed. , Disp: , Rfl:  .  furosemide (LASIX) 20 MG tablet, TAKE 1 TABLET BY MOUTH EVERY DAY, Disp: 90 tablet, Rfl: 1 .  gabapentin (NEURONTIN) 300 MG capsule, TAKE 2 CAPSULES IN THE MORNING, 1 CAPSULE IN THE AFTERNOON AND 2 CAPSULES IN THE EVENING, Disp: 450 capsule, Rfl: 3 .  ipratropium-albuterol (DUONEB) 0.5-2.5 (3) MG/3ML SOLN, TAKE 3 MLS BY NEBULIZATION EVERY 6 (SIX) HOURS AS NEEDED., Disp: 360 mL, Rfl: 1 .  isosorbide mononitrate (IMDUR) 30 MG 24 hr tablet, Take 30 mg by mouth daily as needed (high bp)., Disp: , Rfl:  .  levothyroxine (SYNTHROID) 125 MCG tablet, TAKE 1 TABLET BY MOUTH DAILY, Disp: 90 tablet, Rfl: 1 .  loratadine (CLARITIN) 10 MG tablet, TAKE 1 TABLET (10 MG TOTAL) BY MOUTH DAILY., Disp: 90 tablet, Rfl: 3 .  metFORMIN (GLUCOPHAGE) 1000 MG tablet, TAKE 1 TABLET BY MOUTH TWICE A DAY (Patient taking differently: 1,000 mg. ), Disp: 180 tablet, Rfl: 1 .  mometasone-formoterol (DULERA) 100-5 MCG/ACT AERO, Inhale 2 puffs into the lungs 2 (two) times daily., Disp: 6 Inhaler, Rfl: 3 .  MULTIPLE VITAMIN PO, Take 1 tablet by mouth daily. , Disp: , Rfl:  .  omeprazole (PRILOSEC) 40 MG  capsule, Take 1 capsule (40 mg total) by mouth daily., Disp: 90 capsule, Rfl: 1 .  ondansetron (ZOFRAN ODT) 4 MG disintegrating tablet, Take 1 tablet (4 mg total) by mouth every 8 (eight) hours as needed for nausea or vomiting., Disp: 10 tablet, Rfl: 0 .  ONETOUCH ULTRA test strip, USE 1 STRIP TO TEST GLUCOSE TWICE DAILY AS NEEDED, Disp: 100 strip, Rfl: 12 .  OXYGEN, Inhale 4 L into the lungs., Disp: , Rfl:  .  PARoxetine (PAXIL) 10 MG tablet, Take 1 tablet (10 mg total) by mouth at bedtime., Disp: 90 tablet, Rfl: 3 .  pravastatin (PRAVACHOL) 10 MG tablet, TAKE 1 TABLET (10 MG TOTAL) BY MOUTH DAILY., Disp: 90 tablet, Rfl: 3 .  arformoterol  (BROVANA) 15 MCG/2ML NEBU, Take 2 mLs (15 mcg total) by nebulization 2 (two) times daily. (Patient not taking: Reported on 11/16/2019), Disp: 120 mL, Rfl: 11 .  azithromycin (ZITHROMAX Z-PAK) 250 MG tablet, 2 tabs PO on day 1, then 1 tab PO daily for 4 days (Patient not taking: Reported on 11/16/2019), Disp: 6 each, Rfl: 2 .  benzonatate (TESSALON) 200 MG capsule, Take 1 capsule (200 mg total) by mouth 3 (three) times daily as needed. (Patient not taking: Reported on 11/16/2019), Disp: 30 capsule, Rfl: 0 .  HYDROcodone-acetaminophen (NORCO) 10-325 MG tablet, Take by mouth., Disp: , Rfl:  .  LORazepam (ATIVAN) 0.5 MG tablet, Take 1 tablet (0.5 mg total) by mouth 2 (two) times daily as needed for anxiety. (Patient not taking: Reported on 11/16/2019), Disp: 180 tablet, Rfl: 3 .  ondansetron (ZOFRAN-ODT) 4 MG disintegrating tablet, TAKE 1 TABLET BY MOUTH EVERY 8 HOURS AS NEEDED FOR NAUSEA AND VOMITING (Patient not taking: Reported on 11/16/2019), Disp: 20 tablet, Rfl: 3 .  oxyCODONE-acetaminophen (PERCOCET/ROXICET) 5-325 MG tablet, Take 1 tablet by mouth every 6 (six) hours as needed for severe pain. (Patient not taking: Reported on 10/23/2019), Disp: 20 tablet, Rfl: 0 .  predniSONE (STERAPRED UNI-PAK 21 TAB) 10 MG (21) TBPK tablet, Taper as directed. (Patient not taking: Reported on 11/16/2019), Disp: 21 tablet, Rfl: 0 .  roflumilast (DALIRESP) 500 MCG TABS tablet, Take by mouth., Disp: , Rfl:  .  tiotropium (SPIRIVA) 18 MCG inhalation capsule, Place into inhaler and inhale., Disp: , Rfl:   Review of Systems  Constitutional: Negative for appetite change, chills, fatigue and fever.  Respiratory: Negative for chest tightness and shortness of breath.   Cardiovascular: Negative for chest pain and palpitations.  Gastrointestinal: Positive for nausea. Negative for abdominal pain and vomiting.  Neurological: Positive for dizziness. Negative for weakness.    Social History   Tobacco Use  . Smoking status:  Former Smoker    Packs/day: 0.50    Years: 15.00    Pack years: 7.50  . Smokeless tobacco: Never Used  . Tobacco comment: 30-40 years ago  Substance Use Topics  . Alcohol use: No    Alcohol/week: 0.0 standard drinks      Objective:   BP 119/62   Temp 97.7 F (36.5 C) (Oral)  Vitals:   11/16/19 0940  BP: 119/62  Temp: 97.7 F (36.5 C)  TempSrc: Oral  There is no height or weight on file to calculate BMI.   Physical Exam   No results found for any visits on 11/16/19.     Assessment & Plan     1. Nausea Proving but will provide Zofran as needed. - ondansetron (ZOFRAN ODT) 4 MG disintegrating tablet; Take 1 tablet (4 mg total)  by mouth every 8 (eight) hours as needed for nausea or vomiting.  Dispense: 25 tablet; Refill: 3  2. Dizziness I think she is orthostatic.  Have encouraged her to push fluids.  3. Essential (primary) hypertension   4. Obstructive apnea On CPAP and 24-hour oxygen  5. Simple chronic bronchitis (HCC)   6. Class 1 obesity due to excess calories with serious comorbidity and body mass index (BMI) of 33.0 to 33.9 in adult   7. Anxiety   8. Pulmonary fibrosis ([place]) On 24-hour O2.  I discussed the assessment and treatment plan with the patient. The patient was provided an opportunity to ask questions and all were answered. The patient agreed with the plan and demonstrated an understanding of the instructions.   The patient was advised to call back or seek an in-person evaluation if the symptoms worsen or if the condition fails to improve as anticipated.  I provided 12 minutes of non-face-to-face time during this encounter.    Richard Cranford Mon, MD  [facility]

## 2019-11-17 ENCOUNTER — Other Ambulatory Visit: Payer: Self-pay

## 2019-11-17 ENCOUNTER — Ambulatory Visit (INDEPENDENT_AMBULATORY_CARE_PROVIDER_SITE_OTHER): Payer: Medicare Other | Admitting: Internal Medicine

## 2019-11-17 DIAGNOSIS — I509 Heart failure, unspecified: Secondary | ICD-10-CM

## 2019-11-17 DIAGNOSIS — J449 Chronic obstructive pulmonary disease, unspecified: Secondary | ICD-10-CM

## 2019-11-17 DIAGNOSIS — J9611 Chronic respiratory failure with hypoxia: Secondary | ICD-10-CM | POA: Diagnosis not present

## 2019-11-17 NOTE — Progress Notes (Signed)
[facility] [place], [place] [address]  Internal MEDICINE  Telephone Visit  Patient Name: Kristen Schroeder  [id number]  [id number]  Date of Service: 11/17/2019  I connected with the patient at 1400 by telephone and verified the patients identity using two identifiers.   I discussed the limitations, risks, security and privacy concerns of performing an evaluation and management service by telephone and the availability of in person appointments. I also discussed with the patient that there may be a patient responsible charge related to the service.  The patient expressed understanding and agrees to proceed.      HPI Patient with multiple medical problems including COPD for which she is being seen.  She was tested Covid positive back in July last year she states that she still having difficulty with her sense of smell and sense of taste.  Apparently her husband had been recently admitted to the hospital with stroke he did not have any residual deficits felt to be related to possibly Covid.  Patient states that her breathing is holding off she has not had any exacerbations recently no admissions to the hospital patient did have cough and cold for which she was given azithromycin by her primary care physician.  She denies having any sputum production no hemoptysis no chest pain no fevers or chills at this time.    Current Medication: Outpatient Encounter Medications as of 11/17/2019  Medication Sig Note  . albuterol (PROVENTIL HFA;VENTOLIN HFA) 108 (90 Base) MCG/ACT inhaler Inhale 2 puff every 4-6 hrs as needed for SOB   . arformoterol (BROVANA) 15 MCG/2ML NEBU Take 2 mLs (15 mcg total) by nebulization 2 (two) times daily. (Patient not taking: Reported on 11/16/2019) 10/15/2018: Patient states she does not have this medication  . aspirin EC 81 MG tablet Take 81 mg by mouth daily.   Marland Kitchen azithromycin (ZITHROMAX Z-PAK) 250 MG tablet 2 tabs PO on day 1, then 1 tab PO daily for 4  days (Patient not taking: Reported on 11/16/2019)   . benzonatate (TESSALON) 200 MG capsule Take 1 capsule (200 mg total) by mouth 3 (three) times daily as needed. (Patient not taking: Reported on 11/16/2019)   . blood glucose meter kit and supplies KIT Dispense based on patient and insurance preference. Use up to four times daily as directed. (FOR ICD-9 250.00, 250.01).   . Blood Glucose Monitoring Suppl (ONE TOUCH ULTRA 2) w/Device KIT 1 each by Does not apply route daily.   . enalapril (VASOTEC) 20 MG tablet Take 1 tablet (20 mg total) by mouth daily.   . fluticasone (FLONASE) 50 MCG/ACT nasal spray Place 1 spray into both nostrils daily as needed.    . furosemide (LASIX) 20 MG tablet TAKE 1 TABLET BY MOUTH EVERY DAY   . gabapentin (NEURONTIN) 300 MG capsule TAKE 2 CAPSULES IN THE MORNING, 1 CAPSULE IN THE AFTERNOON AND 2 CAPSULES IN THE EVENING   . HYDROcodone-acetaminophen (NORCO) 10-325 MG tablet Take by mouth. 10/15/2018: Patient no longer has this medication  . ipratropium-albuterol (DUONEB) 0.5-2.5 (3) MG/3ML SOLN TAKE 3 MLS BY NEBULIZATION EVERY 6 (SIX) HOURS AS NEEDED.   Marland Kitchen isosorbide mononitrate (IMDUR) 30 MG 24 hr tablet Take 30 mg by mouth daily as needed (high bp). 10/15/2018: Patient states she takes this medication every day. Patient states her pressures at home run 130s. See last 3 BP  . levothyroxine (SYNTHROID) 125 MCG tablet TAKE 1 TABLET BY MOUTH DAILY   . loratadine (CLARITIN) 10 MG  tablet TAKE 1 TABLET (10 MG TOTAL) BY MOUTH DAILY. 10/15/2018: Takes as needed  . LORazepam (ATIVAN) 0.5 MG tablet Take 1 tablet (0.5 mg total) by mouth 2 (two) times daily as needed for anxiety. (Patient not taking: Reported on 11/16/2019) 10/15/2018: Has not taken "in a while"  . metFORMIN (GLUCOPHAGE) 1000 MG tablet TAKE 1 TABLET BY MOUTH TWICE A DAY (Patient taking differently: 1,000 mg. )   . mometasone-formoterol (DULERA) 100-5 MCG/ACT AERO Inhale 2 puffs into the lungs 2 (two) times daily.  10/15/2018: Patient has one inhaler left with 38 doses. Uses twice a day, sometimes as a rescue but not often. Has 3 symbicort 60 puff each to use until [facility] can be purchased.  . MULTIPLE VITAMIN PO Take 1 tablet by mouth daily.    Marland Kitchen omeprazole (PRILOSEC) 40 MG capsule Take 1 capsule (40 mg total) by mouth daily. 10/15/2018: Taking as needed  . ondansetron (ZOFRAN ODT) 4 MG disintegrating tablet Take 1 tablet (4 mg total) by mouth every 8 (eight) hours as needed for nausea or vomiting.   Glory Rosebush ULTRA test strip USE 1 STRIP TO TEST GLUCOSE TWICE DAILY AS NEEDED   . oxyCODONE-acetaminophen (PERCOCET/ROXICET) 5-325 MG tablet Take 1 tablet by mouth every 6 (six) hours as needed for severe pain. (Patient not taking: Reported on 10/23/2019) 10/15/2018: Patient took all this medication and no refills  . OXYGEN Inhale 4 L into the lungs. 10/15/2018: Patient uses 3 to 3 1/2 liters continuously  . PARoxetine (PAXIL) 10 MG tablet Take 1 tablet (10 mg total) by mouth at bedtime. 10/15/2018: Patient did a self ween and does not feel like she need antidepressant any longer  . pravastatin (PRAVACHOL) 10 MG tablet TAKE 1 TABLET (10 MG TOTAL) BY MOUTH DAILY.   Marland Kitchen predniSONE (STERAPRED UNI-PAK 21 TAB) 10 MG (21) TBPK tablet Taper as directed. (Patient not taking: Reported on 11/16/2019)   . roflumilast (DALIRESP) 500 MCG TABS tablet Take by mouth. 10/15/2018: Patient is unsure if she is taking this medication however recent pulmonary visit indicates that she is.  . tiotropium (SPIRIVA) 18 MCG inhalation capsule Place into inhaler and inhale. 10/15/2018: Patient will resume taking once dulera inhaler is completed (until she can afford new dulera)   No facility-administered encounter medications on file as of 11/17/2019.    Surgical History: Past Surgical History:  Procedure Laterality Date  . ABDOMINAL HYSTERECTOMY    . BOWEL RESECTION N/A 04/14/2018   Procedure: SMALL BOWEL RESECTION;  Surgeon: Jules Husbands,  MD;  Location: [facility] ORS;  Service: General;  Laterality: N/A;  . BREAST BIOPSY Right    benign  . CATARACT EXTRACTION W/PHACO Left 07/03/2016   Procedure: CATARACT EXTRACTION PHACO AND INTRAOCULAR LENS PLACEMENT (IOC);  Surgeon: Birder Robson, MD;  Location: [facility] ORS;  Service: Ophthalmology;  Laterality: Left;  Lot: [id number] H [place]: 00:40.1 AP%: 17.4 CDE:6.94  . HERNIA REPAIR    . LAPAROTOMY N/A 09/04/2018   Procedure: EXPLORATORY LAPAROTOMY;  Surgeon: Olean Ree, MD;  Location: [facility] ORS;  Service: General;  Laterality: N/A;  . TUBAL LIGATION    . VENTRAL HERNIA REPAIR N/A 04/14/2018   Procedure: HERNIA REPAIR VENTRAL ADULT;  Surgeon: Jules Husbands, MD;  Location: [facility] ORS;  Service: General;  Laterality: N/A;    Medical History: Past Medical History:  Diagnosis Date  . Arthritis   . COPD (chronic obstructive pulmonary disease) ([place])   . Diabetes mellitus without complication ([place])   . Dysrhythmia   . Heart murmur   .  History of orthopnea   . Hypertension   . Hypothyroidism   . Neuropathy   . Oxygen dependent    3L  CONTINUOUS  . Pain CHRONIC BACK PAIN  . Shortness of breath dyspnea   . Wheezing     Family History: Family History  Problem Relation Age of Onset  . Heart disease Mother   . Drug abuse Other   . Hypertension Other     Social History   Socioeconomic History  . Marital status: Married    Spouse name: Not on file  . Number of children: 4  . Years of education: Not on file  . Highest education level: 8th grade  Occupational History  . Occupation: retired  Tobacco Use  . Smoking status: Former Smoker    Packs/day: 0.50    Years: 15.00    Pack years: 7.50  . Smokeless tobacco: Never Used  . Tobacco comment: 30-40 years ago  Substance and Sexual Activity  . Alcohol use: No    Alcohol/week: 0.0 standard drinks  . Drug use: No  . Sexual activity: Never  Other Topics Concern  . Not on file  Social History Narrative  . Not on file   Social  Determinants of Health   Financial Resource Strain: Low Risk   . Difficulty of Paying Living Expenses: Not very hard  Food Insecurity:   . Worried About Charity fundraiser in the Last Year: Not on file  . Ran Out of Food in the Last Year: Not on file  Transportation Needs:   . Lack of Transportation (Medical): Not on file  . Lack of Transportation (Non-Medical): Not on file  Physical Activity: Inactive  . Days of Exercise per Week: 0 days  . Minutes of Exercise per Session: 0 min  Stress:   . Feeling of Stress : Not on file  Social Connections: Unknown  . Frequency of Communication with Friends and Family: Patient refused  . Frequency of Social Gatherings with Friends and Family: Patient refused  . Attends Religious Services: Patient refused  . Active Member of Clubs or Organizations: Patient refused  . Attends Archivist Meetings: Patient refused  . Marital Status: Patient refused  Intimate Partner Violence:   . Fear of Current or Ex-Partner: Not on file  . Emotionally Abused: Not on file  . Physically Abused: Not on file  . Sexually Abused: Not on file      Review of Systems  No cough no congestion No chest pain No fevers or chills. Denies having any ankle swelling. No headaches No nausea no vomiting or diarrhea.    Assessment/Plan:  1.  Chronic respiratory failure with hypoxia patient is currently on oxygen she is tolerating the current settings fairly well and get on the without any major issues.  She increases her oxygen as necessary with activity.  2.  COPD she is stable right now has been using her inhalers she uses Dulera MDI along with nebulizers and she states that her breathing has been under good control.  She did have a recent exacerbation perhaps very very mild and was given a Z-Pak for that which seemed to help.  3.  Chronic congestive heart failure right now compensated she will continue with present management supportive care  General  Counseling: Chela verbalizes understanding of the findings of today's phone visit and agrees with plan of treatment. I have discussed any further diagnostic evaluation that may be needed or ordered today. We also reviewed her medications  today. she has been encouraged to call the office with any questions or concerns that should arise related to todays visit.    No orders of the defined types were placed in this encounter.   No orders of the defined types were placed in this encounter.   Time spent:20 [place] MD [facility] Pulmonary Medicine

## 2019-11-23 ENCOUNTER — Ambulatory Visit (INDEPENDENT_AMBULATORY_CARE_PROVIDER_SITE_OTHER): Payer: Medicare Other | Admitting: Family Medicine

## 2019-11-23 ENCOUNTER — Ambulatory Visit: Payer: Self-pay

## 2019-11-23 DIAGNOSIS — R110 Nausea: Secondary | ICD-10-CM

## 2019-11-23 DIAGNOSIS — R42 Dizziness and giddiness: Secondary | ICD-10-CM

## 2019-11-23 MED ORDER — MECLIZINE HCL 12.5 MG PO TABS: 12.5000 mg | ORAL_TABLET | Freq: Three times a day (TID) | ORAL | 0 refills | Status: DC | PRN

## 2019-11-23 NOTE — Progress Notes (Signed)
Patient: Kristen Schroeder Female    DOB: 04-24-1941   79 y.o.   MRN: [id number] Visit Date: 11/23/2019  Today's Provider: Lavon Paganini, MD   Chief Complaint  Patient presents with  . Nausea   Subjective:    Virtual Visit via Telephone Note  I connected with [place] on 11/23/19 at  2:00 PM EST by telephone and verified that I am speaking with the correct person using two identifiers.  Location:  Patient location: home Provider location: [facility] Persons involved in the visit: patient, provider   I discussed the limitations, risks, security and privacy concerns of performing an evaluation and management service by telephone and the availability of in person appointments. I also discussed with the patient that there may be a patient responsible charge related to this service. The patient expressed understanding and agreed to proceed.   HPI   Pt has been feeling nauseous for the past 2 weeks. Pt states that Zofran is only helping mildly for nausea. Pt was seen by Dr. Rosanna Randy on 11/16/2019. She feels like she has improved somewhat.    She feels like she does not like the the smell of food.  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  Drinking ginger-ale and sleeping helped it go away earlier today.  No difficulty with p.o. intake.  She denies any cough, shortness of breath different from baseline, chest pain, dysuria, constipation, diarrhea, vomiting.  She does report dizziness.  It is difficult to figure out whether she is talking about lightheadedness or vertigo.  States she has had vertigo previously.  She states she does not like to open her eyes first thing in the morning because she will feel like her head is swimming.  But she also reports dizziness when standing up quickly.  She was advised to push fluids by her PCP last week as it was thought to be orthostatic hypotension.  She states she is drinking Gatorade.   Allergies    Allergen Reactions  . Codeine Nausea And Vomiting and Nausea Only     Current Outpatient Medications:  .  albuterol (PROVENTIL HFA;VENTOLIN HFA) 108 (90 Base) MCG/ACT inhaler, Inhale 2 puff every 4-6 hrs as needed for SOB, Disp: 1 Inhaler, Rfl: 3 .  arformoterol (BROVANA) 15 MCG/2ML NEBU, Take 2 mLs (15 mcg total) by nebulization 2 (two) times daily. (Patient not taking: Reported on 11/16/2019), Disp: 120 mL, Rfl: 11 .  aspirin EC 81 MG tablet, Take 81 mg by mouth daily., Disp: , Rfl:  .  azithromycin (ZITHROMAX Z-PAK) 250 MG tablet, 2 tabs PO on day 1, then 1 tab PO daily for 4 days (Patient not taking: Reported on 11/16/2019), Disp: 6 each, Rfl: 2 .  benzonatate (TESSALON) 200 MG capsule, Take 1 capsule (200 mg total) by mouth 3 (three) times daily as needed. (Patient not taking: Reported on 11/16/2019), Disp: 30 capsule, Rfl: 0 .  blood glucose meter kit and supplies KIT, Dispense based on patient and insurance preference. Use up to four times daily as directed. (FOR ICD-9 250.00, 250.01)., Disp: 1 each, Rfl: 0 .  Blood Glucose Monitoring Suppl (ONE TOUCH ULTRA 2) w/Device KIT, 1 each by Does not apply route daily., Disp: 1 each, Rfl: 0 .  enalapril (VASOTEC) 20 MG tablet, Take 1 tablet (20 mg total) by mouth daily., Disp: 90 tablet, Rfl: 3 .  fluticasone (FLONASE) 50 MCG/ACT nasal spray, Place 1 spray into both nostrils  daily as needed. , Disp: , Rfl:  .  furosemide (LASIX) 20 MG tablet, TAKE 1 TABLET BY MOUTH EVERY DAY, Disp: 90 tablet, Rfl: 1 .  gabapentin (NEURONTIN) 300 MG capsule, TAKE 2 CAPSULES IN THE MORNING, 1 CAPSULE IN THE AFTERNOON AND 2 CAPSULES IN THE EVENING, Disp: 450 capsule, Rfl: 3 .  HYDROcodone-acetaminophen (NORCO) 10-325 MG tablet, Take by mouth., Disp: , Rfl:  .  ipratropium-albuterol (DUONEB) 0.5-2.5 (3) MG/3ML SOLN, TAKE 3 MLS BY NEBULIZATION EVERY 6 (SIX) HOURS AS NEEDED., Disp: 360 mL, Rfl: 1 .  isosorbide mononitrate (IMDUR) 30 MG 24 hr tablet, Take 30 mg by mouth  daily as needed (high bp)., Disp: , Rfl:  .  levothyroxine (SYNTHROID) 125 MCG tablet, TAKE 1 TABLET BY MOUTH DAILY, Disp: 90 tablet, Rfl: 1 .  loratadine (CLARITIN) 10 MG tablet, TAKE 1 TABLET (10 MG TOTAL) BY MOUTH DAILY., Disp: 90 tablet, Rfl: 3 .  LORazepam (ATIVAN) 0.5 MG tablet, Take 1 tablet (0.5 mg total) by mouth 2 (two) times daily as needed for anxiety. (Patient not taking: Reported on 11/16/2019), Disp: 180 tablet, Rfl: 3 .  metFORMIN (GLUCOPHAGE) 1000 MG tablet, TAKE 1 TABLET BY MOUTH TWICE A DAY (Patient taking differently: 1,000 mg. ), Disp: 180 tablet, Rfl: 1 .  mometasone-formoterol (DULERA) 100-5 MCG/ACT AERO, Inhale 2 puffs into the lungs 2 (two) times daily., Disp: 6 Inhaler, Rfl: 3 .  MULTIPLE VITAMIN PO, Take 1 tablet by mouth daily. , Disp: , Rfl:  .  omeprazole (PRILOSEC) 40 MG capsule, Take 1 capsule (40 mg total) by mouth daily., Disp: 90 capsule, Rfl: 1 .  ondansetron (ZOFRAN ODT) 4 MG disintegrating tablet, Take 1 tablet (4 mg total) by mouth every 8 (eight) hours as needed for nausea or vomiting., Disp: 25 tablet, Rfl: 3 .  ONETOUCH ULTRA test strip, USE 1 STRIP TO TEST GLUCOSE TWICE DAILY AS NEEDED, Disp: 100 strip, Rfl: 12 .  oxyCODONE-acetaminophen (PERCOCET/ROXICET) 5-325 MG tablet, Take 1 tablet by mouth every 6 (six) hours as needed for severe pain. (Patient not taking: Reported on 10/23/2019), Disp: 20 tablet, Rfl: 0 .  OXYGEN, Inhale 4 L into the lungs., Disp: , Rfl:  .  PARoxetine (PAXIL) 10 MG tablet, Take 1 tablet (10 mg total) by mouth at bedtime., Disp: 90 tablet, Rfl: 3 .  pravastatin (PRAVACHOL) 10 MG tablet, TAKE 1 TABLET (10 MG TOTAL) BY MOUTH DAILY., Disp: 90 tablet, Rfl: 3 .  predniSONE (STERAPRED UNI-PAK 21 TAB) 10 MG (21) TBPK tablet, Taper as directed. (Patient not taking: Reported on 11/16/2019), Disp: 21 tablet, Rfl: 0 .  roflumilast (DALIRESP) 500 MCG TABS tablet, Take by mouth., Disp: , Rfl:  .  tiotropium (SPIRIVA) 18 MCG inhalation capsule, Place  into inhaler and inhale., Disp: , Rfl:   Review of Systems  Per HPI  Social History   Tobacco Use  . Smoking status: Former Smoker    Packs/day: 0.50    Years: 15.00    Pack years: 7.50  . Smokeless tobacco: Never Used  . Tobacco comment: 30-40 years ago  Substance Use Topics  . Alcohol use: No    Alcohol/week: 0.0 standard drinks      Objective:   There were no vitals taken for this visit. There were no vitals filed for this visit.There is no height or weight on file to calculate BMI.   Physical Exam Speaking in full sentences with no apparent distress  No results found for any visits on 11/23/19.  Assessment & Plan      I discussed the assessment and treatment plan with the patient. The patient was provided an opportunity to ask questions and all were answered. The patient agreed with the plan and demonstrated an understanding of the instructions.   The patient was advised to call back or seek an in-person evaluation if the symptoms worsen or if the condition fails to improve as anticipated.  I provided 20 minutes of non-face-to-face time during this encounter.  1. Nausea -Ongoing issue x2 weeks -Only mild improvement with Zofran -She did get some improvement with ginger ale today, so I recommended trying ginger candies -No difficulty with p.o. intake, vomiting, constipation, or other symptoms to suggest recurrence of SBO -Continue to push fluids -May be related to her dizziness as below -Discussed return precautions  2. 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 meclizine (ANTIVERT) 12.5 MG tablet    Sig: Take 1 tablet (12.5 mg total) by mouth 3 (three) times daily as needed for dizziness.    Dispense:  30 tablet    Refill:  0     Return if symptoms worsen or fail to improve.   The entirety of the information documented in the History of Present Illness, Review of Systems and Physical Exam were personally obtained by me. Portions of this information were initially documented by Johny Shock , CMA and reviewed by me for thoroughness and accuracy.    Lecia Esperanza, Dionne Bucy, MD MPH [facility]

## 2019-11-23 NOTE — Telephone Encounter (Signed)
Nauseated for 3-4 days taking Rx med. Is drinking water. Pt headache. Last urination was this am . Tongue is not dry. No abd pain or diarrhea. Tolerating crackers and toast.  Care advice given and pt verbalized understanding. Called FC at [facility] and was asked to tell pt that they can do a pone appt at 2 pm. Pt verbalized understanding. Reason for Disposition . Nausea lasts > 1 week  Answer Assessment - Initial Assessment Questions 1. NAUSEA SEVERITY: "How bad is the nausea?" (e.g., mild, moderate, severe; dehydration, weight loss)   - MILD: loss of appetite without change in eating habits   - MODERATE: decreased oral intake without significant weight loss, dehydration, or malnutrition   - SEVERE: inadequate caloric or fluid intake, significant weight loss, symptoms of dehydration     moderate 2. ONSET: "When did the nausea begin?"     4 days ago 3. VOMITING: "Any vomiting?" If so, ask: "How many times today?"     no 4. RECURRENT SYMPTOM: "Have you had nausea before?" If so, ask: "When was the last time?" "What happened that time?"    Nausea seen by provider was given anti nausea meds not working 5. CAUSE: "What do you think is causing the nausea?"      6. PREGNANCY: "Is there any chance you are pregnant?" (e.g., unprotected intercourse, missed birth control pill, broken condom)     *No Answer*  Protocols used: NAUSEA-A-AH

## 2019-11-26 ENCOUNTER — Encounter: Payer: Self-pay | Admitting: Physician Assistant

## 2019-11-26 ENCOUNTER — Ambulatory Visit (INDEPENDENT_AMBULATORY_CARE_PROVIDER_SITE_OTHER): Payer: Medicare Other | Admitting: Physician Assistant

## 2019-11-26 ENCOUNTER — Other Ambulatory Visit: Payer: Self-pay

## 2019-11-26 DIAGNOSIS — R195 Other fecal abnormalities: Secondary | ICD-10-CM

## 2019-11-26 NOTE — Progress Notes (Signed)
Patient: Kristen Schroeder Female    DOB: May 10, 1941   79 y.o.   MRN: [id number] Visit Date: 11/26/2019  Today's Provider: Mar Daring, PA-C   No chief complaint on file.  Subjective:    Virtual Visit via Telephone Note  I connected with [place] on 11/26/19 at  6:00 PM EST by telephone and verified that I am speaking with the correct person using two identifiers.  Location: Patient: Home Provider: Home office in [place]   I discussed the limitations, risks, security and privacy concerns of performing an evaluation and management service by telephone and the availability of in person appointments. I also discussed with the patient that there may be a patient responsible charge related to this service. The patient expressed understanding and agreed to proceed.  HPI Kristen Schroeder is a 79 yr old female that presents today for darkened stools. She was seen on 11/23/19 by Dr. Brita Romp for nausea and dizziness. It was thought the nausea was more related to the dizziness and the dizziness was from orthostatic hypotension and BPPV. She has taken a low dose meclizine and reports symptoms have improved. However, this morning she had a darkened stool. After discussing with the patient she had taken pepto bismol the night before and this morning. She reports she is feeling completely better today.    Allergies  Allergen Reactions  . Codeine Nausea And Vomiting and Nausea Only     Current Outpatient Medications:  .  albuterol (PROVENTIL HFA;VENTOLIN HFA) 108 (90 Base) MCG/ACT inhaler, Inhale 2 puff every 4-6 hrs as needed for SOB (Patient not taking: Reported on 11/23/2019), Disp: 1 Inhaler, Rfl: 3 .  arformoterol (BROVANA) 15 MCG/2ML NEBU, Take 2 mLs (15 mcg total) by nebulization 2 (two) times daily. (Patient not taking: Reported on 11/16/2019), Disp: 120 mL, Rfl: 11 .  aspirin EC 81 MG tablet, Take 81 mg by mouth daily., Disp: , Rfl:  .  azithromycin (ZITHROMAX Z-PAK)  250 MG tablet, 2 tabs PO on day 1, then 1 tab PO daily for 4 days (Patient not taking: Reported on 11/23/2019), Disp: 6 each, Rfl: 2 .  benzonatate (TESSALON) 200 MG capsule, Take 1 capsule (200 mg total) by mouth 3 (three) times daily as needed. (Patient not taking: Reported on 11/16/2019), Disp: 30 capsule, Rfl: 0 .  blood glucose meter kit and supplies KIT, Dispense based on patient and insurance preference. Use up to four times daily as directed. (FOR ICD-9 250.00, 250.01)., Disp: 1 each, Rfl: 0 .  Blood Glucose Monitoring Suppl (ONE TOUCH ULTRA 2) w/Device KIT, 1 each by Does not apply route daily., Disp: 1 each, Rfl: 0 .  enalapril (VASOTEC) 20 MG tablet, Take 1 tablet (20 mg total) by mouth daily., Disp: 90 tablet, Rfl: 3 .  fluticasone (FLONASE) 50 MCG/ACT nasal spray, Place 1 spray into both nostrils daily as needed. , Disp: , Rfl:  .  furosemide (LASIX) 20 MG tablet, TAKE 1 TABLET BY MOUTH EVERY DAY, Disp: 90 tablet, Rfl: 1 .  gabapentin (NEURONTIN) 300 MG capsule, TAKE 2 CAPSULES IN THE MORNING, 1 CAPSULE IN THE AFTERNOON AND 2 CAPSULES IN THE EVENING, Disp: 450 capsule, Rfl: 3 .  HYDROcodone-acetaminophen (NORCO) 10-325 MG tablet, Take by mouth., Disp: , Rfl:  .  ipratropium-albuterol (DUONEB) 0.5-2.5 (3) MG/3ML SOLN, TAKE 3 MLS BY NEBULIZATION EVERY 6 (SIX) HOURS AS NEEDED. (Patient not taking: Reported on 11/23/2019), Disp: 360 mL, Rfl: 1 .  isosorbide mononitrate (  IMDUR) 30 MG 24 hr tablet, Take 30 mg by mouth daily as needed (high bp)., Disp: , Rfl:  .  levothyroxine (SYNTHROID) 125 MCG tablet, TAKE 1 TABLET BY MOUTH DAILY (Patient not taking: Reported on 11/23/2019), Disp: 90 tablet, Rfl: 1 .  loratadine (CLARITIN) 10 MG tablet, TAKE 1 TABLET (10 MG TOTAL) BY MOUTH DAILY. (Patient not taking: Reported on 11/23/2019), Disp: 90 tablet, Rfl: 3 .  LORazepam (ATIVAN) 0.5 MG tablet, Take 1 tablet (0.5 mg total) by mouth 2 (two) times daily as needed for anxiety. (Patient not taking: Reported on  11/16/2019), Disp: 180 tablet, Rfl: 3 .  meclizine (ANTIVERT) 12.5 MG tablet, Take 1 tablet (12.5 mg total) by mouth 3 (three) times daily as needed for dizziness., Disp: 30 tablet, Rfl: 0 .  metFORMIN (GLUCOPHAGE) 1000 MG tablet, TAKE 1 TABLET BY MOUTH TWICE A DAY (Patient not taking: No sig reported), Disp: 180 tablet, Rfl: 1 .  mometasone-formoterol (DULERA) 100-5 MCG/ACT AERO, Inhale 2 puffs into the lungs 2 (two) times daily. (Patient not taking: Reported on 11/23/2019), Disp: 6 Inhaler, Rfl: 3 .  MULTIPLE VITAMIN PO, Take 1 tablet by mouth daily. , Disp: , Rfl:  .  omeprazole (PRILOSEC) 40 MG capsule, Take 1 capsule (40 mg total) by mouth daily. (Patient not taking: Reported on 11/23/2019), Disp: 90 capsule, Rfl: 1 .  ondansetron (ZOFRAN ODT) 4 MG disintegrating tablet, Take 1 tablet (4 mg total) by mouth every 8 (eight) hours as needed for nausea or vomiting., Disp: 25 tablet, Rfl: 3 .  ONETOUCH ULTRA test strip, USE 1 STRIP TO TEST GLUCOSE TWICE DAILY AS NEEDED, Disp: 100 strip, Rfl: 12 .  oxyCODONE-acetaminophen (PERCOCET/ROXICET) 5-325 MG tablet, Take 1 tablet by mouth every 6 (six) hours as needed for severe pain. (Patient not taking: Reported on 10/23/2019), Disp: 20 tablet, Rfl: 0 .  OXYGEN, Inhale 4 L into the lungs., Disp: , Rfl:  .  PARoxetine (PAXIL) 10 MG tablet, Take 1 tablet (10 mg total) by mouth at bedtime., Disp: 90 tablet, Rfl: 3 .  pravastatin (PRAVACHOL) 10 MG tablet, TAKE 1 TABLET (10 MG TOTAL) BY MOUTH DAILY. (Patient not taking: Reported on 11/23/2019), Disp: 90 tablet, Rfl: 3 .  predniSONE (STERAPRED UNI-PAK 21 TAB) 10 MG (21) TBPK tablet, Taper as directed., Disp: 21 tablet, Rfl: 0 .  roflumilast (DALIRESP) 500 MCG TABS tablet, Take by mouth., Disp: , Rfl:  .  tiotropium (SPIRIVA) 18 MCG inhalation capsule, Place into inhaler and inhale., Disp: , Rfl:   Review of Systems  Constitutional: Negative.   Gastrointestinal: Positive for nausea (Pt had nausea and dizziness in the  beginning of the week but this has improved. ). Negative for abdominal distention, abdominal pain, anal bleeding, blood in stool, constipation, diarrhea, rectal pain and vomiting.       Pt reports having Dark Stools but denies bright red blood or black tarry stool.  Pt stated she took [place] last night and today.   Neurological: Negative for dizziness, light-headedness and headaches.    Social History   Tobacco Use  . Smoking status: Former Smoker    Packs/day: 0.50    Years: 15.00    Pack years: 7.50  . Smokeless tobacco: Never Used  . Tobacco comment: 30-40 years ago  Substance Use Topics  . Alcohol use: No    Alcohol/week: 0.0 standard drinks      Objective:   There were no vitals taken for this visit. There were no vitals filed for this  visit.There is no height or weight on file to calculate BMI.   Physical Exam Vitals reviewed.  Constitutional:      General: She is not in acute distress. Pulmonary:     Effort: No respiratory distress.  Neurological:     Mental Status: She is alert.      No results found for any visits on 11/26/19.     Assessment & Plan    1. Dark stools Secondary to medication (pepto bismol). Continue to push fluids. Call if symptoms recur or do not improve.   I did also answer questions about the covid-19 vaccine and process for patient as well.    I discussed the assessment and treatment plan with the patient. The patient was provided an opportunity to ask questions and all were answered. The patient agreed with the plan and demonstrated an understanding of the instructions.   The patient was advised to call back or seek an in-person evaluation if the symptoms worsen or if the condition fails to improve as anticipated.  I provided 13 minutes of non-face-to-face time during this encounter.  I spent approximately 13 minutes with the patient today. Over 50% of this time was spent with counseling and educating the patient.    Mar Daring, PA-C  [facility]

## 2019-11-28 ENCOUNTER — Ambulatory Visit: Payer: Medicare Other

## 2019-12-03 DIAGNOSIS — J449 Chronic obstructive pulmonary disease, unspecified: Secondary | ICD-10-CM | POA: Diagnosis not present

## 2019-12-09 NOTE — Progress Notes (Signed)
Subjective:   Kristen Schroeder is a 79 y.o. female who presents for Medicare Annual (Subsequent) preventive examination.    This visit is being conducted through telemedicine due to the COVID-19 pandemic. This patient has given me verbal consent via doximity to conduct this visit, patient states they are participating from their home address. Some vital signs may be absent or patient reported.    Patient identification: identified by name, DOB, and current address  Review of Systems:  N/A  Cardiac Risk Factors include: advanced age (>73mn, >>31women);diabetes mellitus;dyslipidemia;hypertension;obesity (BMI >30kg/m2)     Objective:     Vitals: There were no vitals taken for this visit.  There is no height or weight on file to calculate BMI. Unable to obtain vitals due to visit being conducted via telephonically.   Diabetes:  Is the patient diabetic?  Yes  If diabetic, was a CBG obtained today?  No  Did the patient bring in their glucometer from home?  No  How often do you monitor your CBG's? Once to twice daily.   Financial Strains and Diabetes Management:  Are you having any financial strains with the device, your supplies or your medication? No .  Does the patient want to be seen by Chronic Care Management for management of their diabetes?  No  Would the patient like to be referred to a Nutritionist or for Diabetic Management?  No   Diabetic Exams:  Diabetic Eye Exam: Completed 06/23/17. Overdue for diabetic eye exam. Pt has been advised about the importance in completing this exam.   Diabetic Foot Exam: Completed 08/26/17. Pt has been advised about the importance in completing this exam. Note made to follow up on this at next in office visit.    Advanced Directives 12/10/2019 06/14/2019 12/08/2018 10/15/2018 09/04/2018 09/01/2018 09/01/2018  Does Patient Have a Medical Advance Directive? _0  Yes Yes  Type of Advance Directive Living will;Healthcare Power of  Attorney Living will;Healthcare Power of [place] Living will Living will Living will  Does patient want to make changes to medical advance directive? - - - No - Patient declined No - Patient declined No - Patient declined No - Patient declined  Copy of [place] Chart? No - copy requested - No - copy requested No - copy requested - - -  Would patient like information on creating a medical advance directive? - - - - - - -    Tobacco Social History   Tobacco Use  Smoking Status Former Smoker  . Packs/day: 0.50  . Years: 15.00  . Pack years: 7.50  Smokeless Tobacco Never Used  Tobacco Comment   30-40 years ago     Counseling given: Not Answered Comment: 30-40 years ago   Clinical Intake:  Pre-visit preparation completed: Yes  Pain : No/denies pain Pain Score: 0-No pain     Nutritional Risks: None Diabetes: Yes  How often do you need to have someone help you when you read instructions, pamphlets, or other written materials from your doctor or pharmacy?: 1 - Never  Interpreter Needed?: No  Information entered by :: [facility] LPN  Past Medical History:  Diagnosis Date  . Arthritis   . COPD (chronic obstructive pulmonary disease) ([place]   . Diabetes mellitus without complication ([place]   . Dysrhythmia   . Heart murmur   . History of orthopnea   . Hypertension   . Hypothyroidism   .  Neuropathy   . Oxygen dependent    3L  CONTINUOUS  . Pain CHRONIC BACK PAIN  . Shortness of breath dyspnea   . Wheezing    Past Surgical History:  Procedure Laterality Date  . ABDOMINAL HYSTERECTOMY    . BOWEL RESECTION N/A 04/14/2018   Procedure: SMALL BOWEL RESECTION;  Surgeon: Jules Husbands, MD;  Location: [facility] ORS;  Service: General;  Laterality: N/A;  . BREAST BIOPSY Right    benign  . CATARACT EXTRACTION W/PHACO Left 07/03/2016   Procedure: CATARACT EXTRACTION PHACO AND  INTRAOCULAR LENS PLACEMENT (IOC);  Surgeon: Birder Robson, MD;  Location: [facility] ORS;  Service: Ophthalmology;  Laterality: Left;  Lot: [id number] H [place]: 00:40.1 AP%: 17.4 CDE:6.94  . HERNIA REPAIR    . LAPAROTOMY N/A 09/04/2018   Procedure: EXPLORATORY LAPAROTOMY;  Surgeon: Olean Ree, MD;  Location: [facility] ORS;  Service: General;  Laterality: N/A;  . TUBAL LIGATION    . VENTRAL HERNIA REPAIR N/A 04/14/2018   Procedure: HERNIA REPAIR VENTRAL ADULT;  Surgeon: Jules Husbands, MD;  Location: [facility] ORS;  Service: General;  Laterality: N/A;   Family History  Problem Relation Age of Onset  . Heart disease Mother   . Drug abuse Other   . Hypertension Other    Social History   Socioeconomic History  . Marital status: Married    Spouse name: Not on file  . Number of children: 4  . Years of education: Not on file  . Highest education level: 8th grade  Occupational History  . Occupation: retired  Tobacco Use  . Smoking status: Former Smoker    Packs/day: 0.50    Years: 15.00    Pack years: 7.50  . Smokeless tobacco: Never Used  . Tobacco comment: 30-40 years ago  Substance and Sexual Activity  . Alcohol use: No    Alcohol/week: 0.0 standard drinks  . Drug use: No  . Sexual activity: Never  Other Topics Concern  . Not on file  Social History Narrative  . Not on file   Social Determinants of Health   Financial Resource Strain: Low Risk   . Difficulty of Paying Living Expenses: Not hard at all  Food Insecurity: No Food Insecurity  . Worried About Charity fundraiser in the Last Year: Never true  . Ran Out of Food in the Last Year: Never true  Transportation Needs: No Transportation Needs  . Lack of Transportation (Medical): No  . Lack of Transportation (Non-Medical): No  Physical Activity: Inactive  . Days of Exercise per Week: 0 days  . Minutes of Exercise per Session: 0 min  Stress: No Stress Concern Present  . Feeling of Stress : Only a little  Social Connections: Slightly  Isolated  . Frequency of Communication with Friends and Family: More than three times a week  . Frequency of Social Gatherings with Friends and Family: More than three times a week  . Attends Religious Services: More than 4 times per year  . Active Member of Clubs or Organizations: No  . Attends Archivist Meetings: Never  . Marital Status: Married    Outpatient Encounter Medications as of 12/10/2019  Medication Sig  . albuterol (PROVENTIL HFA;VENTOLIN HFA) 108 (90 Base) MCG/ACT inhaler Inhale 2 puff every 4-6 hrs as needed for SOB  . arformoterol (BROVANA) 15 MCG/2ML NEBU Take 2 mLs (15 mcg total) by nebulization 2 (two) times daily.  Marland Kitchen aspirin EC 81 MG tablet Take 81 mg by mouth daily.  Marland Kitchen  Blood Glucose Monitoring Suppl (ONE TOUCH ULTRA 2) w/Device KIT 1 each by Does not apply route daily.  . enalapril (VASOTEC) 20 MG tablet Take 1 tablet (20 mg total) by mouth daily.  . fluticasone (FLONASE) 50 MCG/ACT nasal spray Place 1 spray into both nostrils daily as needed.   . furosemide (LASIX) 20 MG tablet TAKE 1 TABLET BY MOUTH EVERY DAY  . gabapentin (NEURONTIN) 300 MG capsule TAKE 2 CAPSULES IN THE MORNING, 1 CAPSULE IN THE AFTERNOON AND 2 CAPSULES IN THE EVENING  . ipratropium-albuterol (DUONEB) 0.5-2.5 (3) MG/3ML SOLN TAKE 3 MLS BY NEBULIZATION EVERY 6 (SIX) HOURS AS NEEDED.  Marland Kitchen isosorbide mononitrate (IMDUR) 30 MG 24 hr tablet Take 30 mg by mouth daily as needed (high bp).  Marland Kitchen levothyroxine (SYNTHROID) 125 MCG tablet TAKE 1 TABLET BY MOUTH DAILY  . loratadine (CLARITIN) 10 MG tablet TAKE 1 TABLET (10 MG TOTAL) BY MOUTH DAILY.  Marland Kitchen LORazepam (ATIVAN) 0.5 MG tablet Take 1 tablet (0.5 mg total) by mouth 2 (two) times daily as needed for anxiety.  . meclizine (ANTIVERT) 12.5 MG tablet Take 1 tablet (12.5 mg total) by mouth 3 (three) times daily as needed for dizziness.  . metFORMIN (GLUCOPHAGE) 1000 MG tablet TAKE 1 TABLET BY MOUTH TWICE A DAY (Patient taking differently: Take 1,000 mg by  mouth daily with breakfast. )  . mometasone-formoterol (DULERA) 100-5 MCG/ACT AERO Inhale 2 puffs into the lungs 2 (two) times daily.  . MULTIPLE VITAMIN PO Take 1 tablet by mouth daily.   Marland Kitchen omeprazole (PRILOSEC) 40 MG capsule Take 1 capsule (40 mg total) by mouth daily.  . ondansetron (ZOFRAN ODT) 4 MG disintegrating tablet Take 1 tablet (4 mg total) by mouth every 8 (eight) hours as needed for nausea or vomiting.  Glory Rosebush ULTRA test strip USE 1 STRIP TO TEST GLUCOSE TWICE DAILY AS NEEDED  . OXYGEN Inhale 4 L into the lungs.  Marland Kitchen PARoxetine (PAXIL) 10 MG tablet Take 1 tablet (10 mg total) by mouth at bedtime.  . pravastatin (PRAVACHOL) 10 MG tablet TAKE 1 TABLET (10 MG TOTAL) BY MOUTH DAILY.  . roflumilast (DALIRESP) 500 MCG TABS tablet Take 500 mcg by mouth daily. As needed  . azithromycin (ZITHROMAX Z-PAK) 250 MG tablet 2 tabs PO on day 1, then 1 tab PO daily for 4 days (Patient not taking: Reported on 11/23/2019)  . benzonatate (TESSALON) 200 MG capsule Take 1 capsule (200 mg total) by mouth 3 (three) times daily as needed. (Patient not taking: Reported on 11/16/2019)  . blood glucose meter kit and supplies KIT Dispense based on patient and insurance preference. Use up to four times daily as directed. (FOR ICD-9 250.00, 250.01). (Patient not taking: Reported on 12/10/2019)  . HYDROcodone-acetaminophen (NORCO) 10-325 MG tablet Take by mouth.  Marland Kitchen lisinopril (ZESTRIL) 20 MG tablet Take by mouth.  . oxyCODONE-acetaminophen (PERCOCET/ROXICET) 5-325 MG tablet Take 1 tablet by mouth every 6 (six) hours as needed for severe pain. (Patient not taking: Reported on 10/23/2019)  . predniSONE (STERAPRED UNI-PAK 21 TAB) 10 MG (21) TBPK tablet Taper as directed. (Patient not taking: Reported on 12/10/2019)  . tiotropium (SPIRIVA) 18 MCG inhalation capsule Place into inhaler and inhale.   No facility-administered encounter medications on file as of 12/10/2019.    Activities of Daily Living In your present state  of health, do you have any difficulty performing the following activities: 12/10/2019  Hearing? N  Vision? N  Difficulty concentrating or making decisions? N  Walking or climbing  stairs? Y  Comment Due to SOB.  Dressing or bathing? N  Doing errands, shopping? N  Preparing Food and eating ? N  Using the Toilet? N  In the past six months, have you accidently leaked urine? N  Do you have problems with loss of bowel control? N  Managing your Medications? N  Managing your Finances? N  Housekeeping or managing your Housekeeping? N  Some recent data might be hidden    Patient Care Team: Jerrol Banana., MD as PCP - General (Family Medicine) Birder Robson, MD as Referring Physician (Ophthalmology) Yolonda Kida, MD as Consulting Physician (Cardiology) Cathi Roan, [facility] (Pharmacist) Allyne Gee, MD as Consulting Physician (Internal Medicine)    Assessment:   This is a routine wellness examination for Kristen Schroeder.  Exercise Activities and Dietary recommendations Current Exercise Habits: The patient does not participate in regular exercise at present, Exercise limited by: None identified  Goals      Patient Stated   . "I can't afford this Dulera" (pt-stated)     Current Barriers:  . Financial Barriers  Pharmacist Clinical Goal(s):  Marland Kitchen Over the next 30 days, patient will work with [facility] pharmacist and Dr. Humphrey Rolls to address needs related to medication assistance for [facility] and Proventil  Interventions: . Comprehensive medication review performed. . Collaboration with provider re: medication management (Dr. Humphrey Rolls of [facility] for medication assistance for [facility] provided by [employer]) . Reviewed options for medication assistance for [facility] and Proventil  Patient Self Care Activities:  . Self administers medications as prescribed . Calls pharmacy for medication refills   Please see past updates related to this goal by clicking on the "Past Updates" button in the selected  goal      . I just wanted to let you know I went to the hospital yesterday (pt-stated)     Current Barriers:  . Anxiety  Nurse Case Manager Clinical Goal(s):  Marland Kitchen Over the next 30 days, patient will experience decrease in ED visits. ED visits in last 6 months = 1  Interventions:  . Reviewed ED provider notes . Assessed for receipt and compliance with new prescription (zpack) . Assessed for additional respiratory symptoms . Discussed availability of practice after hours if needed and when unsure if ED is appropriate level of care . Discussed utilization of Ativan as prescribed for anxiety when needed and appropriate  Patient Self Care Activities:  . Self administers medications as prescribed . Attends all scheduled provider appointments . Calls pharmacy for medication refills . Attends church or other social activities . Performs ADL's independently . Performs IADL's independently . Calls provider office for new concerns or questions  Initial goal documentation        Other   . DIET - REDUCE PORTION SIZE     Recommend to continue with current diet regimen of cutting portion sizes in half and eating 3 healthy meals a day with 2 healthy snacks in between.     . Exercise 3x per week (30 min per time)     Recommend to exercise (walk) for 3 days a week for at least 30 minutes at a time.     . Increase water intake     Starting 10/17/16, I will increase my water intake from 5 glasses a day to 6 glasses.       Fall Risk: Fall Risk  12/10/2019 09/15/2019 12/08/2018 10/15/2018 09/19/2018  Falls in the past year? 0 0 0 0 0  Number falls in  past yr: 0 0 - - -  Injury with Fall? 0 0 - - -  Risk for fall due to : - Impaired balance/gait;Impaired mobility - - -  Follow up - Falls evaluation completed - - -    FALL RISK PREVENTION PERTAINING TO THE HOME:  Any stairs in or around the home? No  If so, are there any without handrails? N/A  Home free of loose throw rugs in walkways,  pet beds, electrical cords, etc? Yes  Adequate lighting in your home to reduce risk of falls? Yes   ASSISTIVE DEVICES UTILIZED TO PREVENT FALLS:  Life alert? No  Use of a cane, walker or w/c? Yes  Grab bars in the bathroom? Yes  Shower chair or bench in shower? Yes  Elevated toilet seat or a handicapped toilet? No    TIMED UP AND GO:  Was the test performed? No .    Depression Screen PHQ 2/9 Scores 12/10/2019 12/10/2019 09/15/2019 12/08/2018  PHQ - 2 Score 1 1 0 0  PHQ- 9 Score - - 1 -     Cognitive Function     6CIT Screen 12/10/2019 12/08/2018 12/04/2017 10/17/2016  What Year? 0 points 0 points 0 points 0 points  What month? 0 points 0 points 0 points 0 points  What time? 0 points 0 points 0 points 0 points  Count back from 20 0 points 0 points 0 points 4 points  Months in reverse 0 points 0 points 0 points 0 points  Repeat phrase 2 points 2 points 0 points 2 points  Total Score 2 2 0 6    Immunization History  Administered Date(s) Administered  . Fluad Quad(high Dose 65+) 07/09/2019  . Influenza Split 07/23/2012  . Influenza, High Dose Seasonal PF 08/24/2014, 08/18/2015, 08/28/2016, 07/11/2017, 09/24/2018  . Influenza,inj,Quad PF,6+ Mos 07/29/2013  . Pneumococcal Conjugate-13 08/24/2014  . Pneumococcal Polysaccharide-23 11/03/1999, 07/23/2012  . Tdap 07/23/2012  . Zoster 07/23/2012    Qualifies for Shingles Vaccine? Yes  Zostavax completed 07/23/12. Due for Shingrix. Pt has been advised to call insurance company to determine out of pocket expense. Advised may also receive vaccine at local pharmacy or Health Dept. Verbalized acceptance and understanding.  Tdap: Up to date  Flu Vaccine: Up to date  Pneumococcal Vaccine: Completed series  Screening Tests Health Maintenance  Topic Date Due  . FOOT EXAM  08/26/2018  . OPHTHALMOLOGY EXAM  06/14/2019  . HEMOGLOBIN A1C  03/14/2020  . TETANUS/TDAP  07/23/2022  . DEXA SCAN  01/13/2029  . INFLUENZA VACCINE  Completed  .  PNA vac Low Risk Adult  Completed    Cancer Screenings:  Colorectal Screening: No longer required.   Mammogram: No longer required.   Bone Density: Completed 01/14/19. Results reflect NORMAL. No repeat needed unless advised by a physician.  Lung Cancer Screening: (Low Dose CT Chest recommended if Age 40-80 years, 30 pack-year currently smoking OR have quit w/in 15years.) does not qualify.   Additional Screening:  Vision Screening: Recommended annual ophthalmology exams for early detection of glaucoma and other disorders of the eye.  Dental Screening: Recommended annual dental exams for proper oral hygiene  Community Resource Referral:  CRR required this visit?  No       Plan:  I have personally reviewed and addressed the Medicare Annual Wellness questionnaire and have noted the following in the patient's chart:  A. Medical and social history B. Use of alcohol, tobacco or illicit drugs  C. Current medications and supplements D.  Functional ability and status E.  Nutritional status F.  Physical activity G. Advance directives H. List of other physicians I.  Hospitalizations, surgeries, and ER visits in previous 12 months J.  [place] such as hearing and vision if needed, cognitive and depression L. Referrals and appointments   In addition, I have reviewed and discussed with patient certain preventive protocols, quality metrics, and best practice recommendations. A written personalized care plan for preventive services as well as general preventive health recommendations were provided to patient. Nurse Health Advisor  Signed,    Geneal Huebert [place], [place]  03/10/2562 Nurse Health Advisor   Nurse Notes: Pt needs a diabetic foot exam at next in office apt. Pt to set up an eye exam this year.

## 2019-12-10 ENCOUNTER — Other Ambulatory Visit: Payer: Self-pay

## 2019-12-10 ENCOUNTER — Telehealth: Payer: Self-pay

## 2019-12-10 ENCOUNTER — Ambulatory Visit (INDEPENDENT_AMBULATORY_CARE_PROVIDER_SITE_OTHER): Payer: Medicare Other

## 2019-12-10 DIAGNOSIS — Z0000 Encounter for general adult medical examination without abnormal findings: Secondary | ICD-10-CM

## 2019-12-10 NOTE — Telephone Encounter (Signed)
Pt called that her nebulizer machine is not working faxed rx for nebulizer to [name] [phone]

## 2019-12-10 NOTE — Patient Instructions (Signed)
[name] , Thank you for taking time to come for your Medicare Wellness Visit. I appreciate your ongoing commitment to your health goals. Please review the following plan we discussed and let me know if I can assist you in the future.   Screening recommendations/referrals: Colonoscopy: No longer required.  Mammogram: No longer required.  Bone Density: Up to date. Previous DEXA scan was normal. No repeat needed unless advised by a physician. Recommended yearly ophthalmology/optometry visit for glaucoma screening and checkup Recommended yearly dental visit for hygiene and checkup  Vaccinations: Influenza vaccine: Up to date Pneumococcal vaccine: Completed series Tdap vaccine: Up to date, due 07/2022 Shingles vaccine: Pt declines today.     Advanced directives: Please bring a copy of your POA (Power of Attorney) and/or Living Will to your next appointment.   Conditions/risks identified: Recommend to start walking 3 days a week for 30 minutes at a time.   Next appointment: 12/15/19 @ 10:20 AM with Dr Sullivan Lone. Declined scheduling an AWV for 2022 at this time.    Preventive Care 14 Years and Older, Female Preventive care refers to lifestyle choices and visits with your health care provider that can promote health and wellness. What does preventive care include?  A yearly physical exam. This is also called an annual well check.  Dental exams once or twice a year.  Routine eye exams. Ask your health care provider how often you should have your eyes checked.  Personal lifestyle choices, including:  Daily care of your teeth and gums.  Regular physical activity.  Eating a healthy diet.  Avoiding tobacco and drug use.  Limiting alcohol use.  Practicing safe sex.  Taking low-dose aspirin every day.  Taking vitamin and mineral supplements as recommended by your health care provider. What happens during an annual well check? The services and screenings done by your health care  provider during your annual well check will depend on your age, overall health, lifestyle risk factors, and family history of disease. Counseling  Your health care provider may ask you questions about your:  Alcohol use.  Tobacco use.  Drug use.  Emotional well-being.  Home and relationship well-being.  Sexual activity.  Eating habits.  History of falls.  Memory and ability to understand (cognition).  Work and work Astronomer.  Reproductive health. Screening  You may have the following tests or measurements:  Height, weight, and BMI.  Blood pressure.  Lipid and cholesterol levels. These may be checked every 5 years, or more frequently if you are over 81 years old.  Skin check.  Lung cancer screening. You may have this screening every year starting at age 9 if you have a 30-pack-year history of smoking and currently smoke or have quit within the past 15 years.  Fecal occult blood test (FOBT) of the stool. You may have this test every year starting at age 31.  Flexible sigmoidoscopy or colonoscopy. You may have a sigmoidoscopy every 5 years or a colonoscopy every 10 years starting at age 90.  Hepatitis C blood test.  Hepatitis B blood test.  Sexually transmitted disease (STD) testing.  Diabetes screening. This is done by checking your blood sugar (glucose) after you have not eaten for a while (fasting). You may have this done every 1-3 years.  Bone density scan. This is done to screen for osteoporosis. You may have this done starting at age 43.  Mammogram. This may be done every 1-2 years. Talk to your health care provider about how often you should  have regular mammograms. Talk with your health care provider about your test results, treatment options, and if necessary, the need for more tests. Vaccines  Your health care provider may recommend certain vaccines, such as:  Influenza vaccine. This is recommended every year.  Tetanus, diphtheria, and acellular  pertussis (Tdap, Td) vaccine. You may need a Td booster every 10 years.  Zoster vaccine. You may need this after age 53.  Pneumococcal 13-valent conjugate (PCV13) vaccine. One dose is recommended after age 53.  Pneumococcal polysaccharide (PPSV23) vaccine. One dose is recommended after age 89. Talk to your health care provider about which screenings and vaccines you need and how often you need them. This information is not intended to replace advice given to you by your health care provider. Make sure you discuss any questions you have with your health care provider. Document Released: 11/18/2015 Document Revised: 07/11/2016 Document Reviewed: 08/23/2015 Elsevier Interactive Patient Education  2017 [employer] Prevention in the Home Falls can cause injuries. They can happen to people of all ages. There are many things you can do to make your home safe and to help prevent falls. What can I do on the outside of my home?  Regularly fix the edges of walkways and driveways and fix any cracks.  Remove anything that might make you trip as you walk through a door, such as a raised step or threshold.  Trim any bushes or trees on the path to your home.  Use bright outdoor lighting.  Clear any walking paths of anything that might make someone trip, such as rocks or tools.  Regularly check to see if handrails are loose or broken. Make sure that both sides of any steps have handrails.  Any raised decks and porches should have guardrails on the edges.  Have any leaves, snow, or ice cleared regularly.  Use sand or salt on walking paths during winter.  Clean up any spills in your garage right away. This includes oil or grease spills. What can I do in the bathroom?  Use night lights.  Install grab bars by the toilet and in the tub and shower. Do not use towel bars as grab bars.  Use non-skid mats or decals in the tub or shower.  If you need to sit down in the shower, use a plastic,  non-slip stool.  Keep the floor dry. Clean up any water that spills on the floor as soon as it happens.  Remove soap buildup in the tub or shower regularly.  Attach bath mats securely with double-sided non-slip rug tape.  Do not have throw rugs and other things on the floor that can make you trip. What can I do in the bedroom?  Use night lights.  Make sure that you have a light by your bed that is easy to reach.  Do not use any sheets or blankets that are too big for your bed. They should not hang down onto the floor.  Have a firm chair that has side arms. You can use this for support while you get dressed.  Do not have throw rugs and other things on the floor that can make you trip. What can I do in the kitchen?  Clean up any spills right away.  Avoid walking on wet floors.  Keep items that you use a lot in easy-to-reach places.  If you need to reach something above you, use a strong step stool that has a grab bar.  Keep electrical cords out of  the way.  Do not use floor polish or wax that makes floors slippery. If you must use wax, use non-skid floor wax.  Do not have throw rugs and other things on the floor that can make you trip. What can I do with my stairs?  Do not leave any items on the stairs.  Make sure that there are handrails on both sides of the stairs and use them. Fix handrails that are broken or loose. Make sure that handrails are as long as the stairways.  Check any carpeting to make sure that it is firmly attached to the stairs. Fix any carpet that is loose or worn.  Avoid having throw rugs at the top or bottom of the stairs. If you do have throw rugs, attach them to the floor with carpet tape.  Make sure that you have a light switch at the top of the stairs and the bottom of the stairs. If you do not have them, ask someone to add them for you. What else can I do to help prevent falls?  Wear shoes that:  Do not have high heels.  Have rubber  bottoms.  Are comfortable and fit you well.  Are closed at the toe. Do not wear sandals.  If you use a stepladder:  Make sure that it is fully opened. Do not climb a closed stepladder.  Make sure that both sides of the stepladder are locked into place.  Ask someone to hold it for you, if possible.  Clearly mark and make sure that you can see:  Any grab bars or handrails.  First and last steps.  Where the edge of each step is.  Use tools that help you move around (mobility aids) if they are needed. These include:  Canes.  Walkers.  Scooters.  Crutches.  Turn on the lights when you go into a dark area. Replace any light bulbs as soon as they burn out.  Set up your furniture so you have a clear path. Avoid moving your furniture around.  If any of your floors are uneven, fix them.  If there are any pets around you, be aware of where they are.  Review your medicines with your doctor. Some medicines can make you feel dizzy. This can increase your chance of falling. Ask your doctor what other things that you can do to help prevent falls. This information is not intended to replace advice given to you by your health care provider. Make sure you discuss any questions you have with your health care provider. Document Released: 08/18/2009 Document Revised: 03/29/2016 Document Reviewed: 11/26/2014 Elsevier Interactive Patient Education  2017 [employer].

## 2019-12-11 DIAGNOSIS — J449 Chronic obstructive pulmonary disease, unspecified: Secondary | ICD-10-CM | POA: Diagnosis not present

## 2019-12-14 NOTE — Progress Notes (Signed)
Patient: Kristen Schroeder, Female    DOB: 08-Mar-1941, 79 y.o.   MRN: [id number] Visit Date: 12/15/2019  Today's Provider: Wilhemena Durie, MD   Chief Complaint  Patient presents with  . Annual Exam   Subjective:     Patient had AWV with [facility] 12/10/2019.   Complete Physical Abby L Viramontes is a 79 y.o. female. She feels well. She reports she is exercising. She reports she is sleeping fairly well.  -----------------------------------------------------------  Colonoscopy: 04/06/2008 Mammogram: 01/14/2019  Review of Systems  Constitutional: Negative.   HENT: Negative.   Eyes: Negative.   Respiratory: Negative.   Cardiovascular: Negative.   Gastrointestinal: Negative.   Endocrine: Negative.   Genitourinary: Negative.   Musculoskeletal: Negative.   Skin: Negative.   Neurological: Negative.   Hematological: Negative.   Psychiatric/Behavioral: Negative.     Social History   Socioeconomic History  . Marital status: Married    Spouse name: Not on file  . Number of children: 4  . Years of education: Not on file  . Highest education level: 8th grade  Occupational History  . Occupation: retired  Tobacco Use  . Smoking status: Former Smoker    Packs/day: 0.50    Years: 15.00    Pack years: 7.50  . Smokeless tobacco: Never Used  . Tobacco comment: 30-40 years ago  Substance and Sexual Activity  . Alcohol use: No    Alcohol/week: 0.0 standard drinks  . Drug use: No  . Sexual activity: Never  Other Topics Concern  . Not on file  Social History Narrative  . Not on file   Social Determinants of Health   Financial Resource Strain: Low Risk   . Difficulty of Paying Living Expenses: Not hard at all  Food Insecurity: No Food Insecurity  . Worried About Charity fundraiser in the Last Year: Never true  . Ran Out of Food in the Last Year: Never true  Transportation Needs: No Transportation Needs  . Lack of Transportation (Medical): No  . Lack of Transportation  (Non-Medical): No  Physical Activity: Inactive  . Days of Exercise per Week: 0 days  . Minutes of Exercise per Session: 0 min  Stress: No Stress Concern Present  . Feeling of Stress : Only a little  Social Connections: Slightly Isolated  . Frequency of Communication with Friends and Family: More than three times a week  . Frequency of Social Gatherings with Friends and Family: More than three times a week  . Attends Religious Services: More than 4 times per year  . Active Member of Clubs or Organizations: No  . Attends Archivist Meetings: Never  . Marital Status: Married  Human resources officer Violence: Not At Risk  . Fear of Current or Ex-Partner: No  . Emotionally Abused: No  . Physically Abused: No  . Sexually Abused: No    Past Medical History:  Diagnosis Date  . Arthritis   . COPD (chronic obstructive pulmonary disease) ([place])   . Diabetes mellitus without complication ([place])   . Dysrhythmia   . Heart murmur   . History of orthopnea   . Hypertension   . Hypothyroidism   . Neuropathy   . Oxygen dependent    3L  CONTINUOUS  . Pain CHRONIC BACK PAIN  . Shortness of breath dyspnea   . Wheezing      Patient Active Problem List   Diagnosis Date Noted  . Incarcerated hernia of abdominal cavity 08/04/2018  .  SBO (small bowel obstruction) ([place]) 04/22/2018  . Incarcerated hernia 04/14/2018  . Aneurysm ([place]) 07/10/2016  . Nonspecific abnormal finding 05/11/2015  . Allergic rhinitis 05/11/2015  . Anxiety 05/11/2015  . Bronchitis, chronic ([place]) 05/11/2015  . CCF (congestive cardiac failure) ([place]) 05/11/2015  . Clinical depression 05/11/2015  . 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 Essential (primary) hypertension 05/11/2015  . Acid reflux 05/11/2015  . HLD (hyperlipidemia) 05/11/2015  . Adult hypothyroidism 05/11/2015  . Cervical dysplasia, mild 05/11/2015  . Neuropathy 05/11/2015  . Adiposity 05/11/2015  . Obstructive apnea 05/11/2015  .  Arthritis, degenerative 05/11/2015    Past Surgical History:  Procedure Laterality Date  . ABDOMINAL HYSTERECTOMY    . BOWEL RESECTION N/A 04/14/2018   Procedure: SMALL BOWEL RESECTION;  Surgeon: Jules Husbands, MD;  Location: [facility] ORS;  Service: General;  Laterality: N/A;  . BREAST BIOPSY Right    benign  . CATARACT EXTRACTION W/PHACO Left 07/03/2016   Procedure: CATARACT EXTRACTION PHACO AND INTRAOCULAR LENS PLACEMENT (IOC);  Surgeon: Birder Robson, MD;  Location: [facility] ORS;  Service: Ophthalmology;  Laterality: Left;  Lot: [id number] H [place]: 00:40.1 AP%: 17.4 CDE:6.94  . HERNIA REPAIR    . LAPAROTOMY N/A 09/04/2018   Procedure: EXPLORATORY LAPAROTOMY;  Surgeon: Olean Ree, MD;  Location: [facility] ORS;  Service: General;  Laterality: N/A;  . TUBAL LIGATION    . VENTRAL HERNIA REPAIR N/A 04/14/2018   Procedure: HERNIA REPAIR VENTRAL ADULT;  Surgeon: Jules Husbands, MD;  Location: [facility] ORS;  Service: General;  Laterality: N/A;    Her family history includes Drug abuse in an other family member; Heart disease in her mother; Hypertension in an other family member.   Current Outpatient Medications:  .  albuterol (PROVENTIL HFA;VENTOLIN HFA) 108 (90 Base) MCG/ACT inhaler, Inhale 2 puff every 4-6 hrs as needed for SOB, Disp: 1 Inhaler, Rfl: 3 .  arformoterol (BROVANA) 15 MCG/2ML NEBU, Take 2 mLs (15 mcg total) by nebulization 2 (two) times daily., Disp: 120 mL, Rfl: 11 .  aspirin EC 81 MG tablet, Take 81 mg by mouth daily., Disp: , Rfl:  .  azithromycin (ZITHROMAX Z-PAK) 250 MG tablet, 2 tabs PO on day 1, then 1 tab PO daily for 4 days (Patient not taking: Reported on 11/23/2019), Disp: 6 each, Rfl: 2 .  benzonatate (TESSALON) 200 MG capsule, Take 1 capsule (200 mg total) by mouth 3 (three) times daily as needed. (Patient not taking: Reported on 11/16/2019), Disp: 30 capsule, Rfl: 0 .  blood glucose meter kit and supplies KIT, Dispense based on patient and insurance preference. Use up to four times  daily as directed. (FOR ICD-9 250.00, 250.01). (Patient not taking: Reported on 12/10/2019), Disp: 1 each, Rfl: 0 .  Blood Glucose Monitoring Suppl (ONE TOUCH ULTRA 2) w/Device KIT, 1 each by Does not apply route daily., Disp: 1 each, Rfl: 0 .  enalapril (VASOTEC) 20 MG tablet, Take 1 tablet (20 mg total) by mouth daily., Disp: 90 tablet, Rfl: 3 .  fluticasone (FLONASE) 50 MCG/ACT nasal spray, Place 1 spray into both nostrils daily as needed. , Disp: , Rfl:  .  furosemide (LASIX) 20 MG tablet, TAKE 1 TABLET BY MOUTH EVERY DAY, Disp: 90 tablet, Rfl: 1 .  gabapentin (NEURONTIN) 300 MG capsule, TAKE 2 CAPSULES IN THE MORNING, 1 CAPSULE IN THE AFTERNOON AND 2 CAPSULES IN THE EVENING, Disp: 450 capsule, Rfl: 3 .  HYDROcodone-acetaminophen (NORCO) 10-325 MG tablet, Take by mouth., Disp: , Rfl:  .  ipratropium-albuterol (DUONEB) 0.5-2.5 (3) MG/3ML SOLN, TAKE 3 MLS BY NEBULIZATION EVERY 6 (SIX) HOURS AS NEEDED., Disp: 360 mL, Rfl: 1 .  isosorbide mononitrate (IMDUR) 30 MG 24 hr tablet, Take 30 mg by mouth daily as needed (high bp)., Disp: , Rfl:  .  levothyroxine (SYNTHROID) 125 MCG tablet, TAKE 1 TABLET BY MOUTH DAILY, Disp: 90 tablet, Rfl: 1 .  lisinopril (ZESTRIL) 20 MG tablet, Take by mouth., Disp: , Rfl:  .  loratadine (CLARITIN) 10 MG tablet, TAKE 1 TABLET (10 MG TOTAL) BY MOUTH DAILY., Disp: 90 tablet, Rfl: 3 .  LORazepam (ATIVAN) 0.5 MG tablet, Take 1 tablet (0.5 mg total) by mouth 2 (two) times daily as needed for anxiety., Disp: 180 tablet, Rfl: 3 .  meclizine (ANTIVERT) 12.5 MG tablet, Take 1 tablet (12.5 mg total) by mouth 3 (three) times daily as needed for dizziness., Disp: 30 tablet, Rfl: 0 .  metFORMIN (GLUCOPHAGE) 1000 MG tablet, TAKE 1 TABLET BY MOUTH TWICE A DAY (Patient taking differently: Take 1,000 mg by mouth daily with breakfast. ), Disp: 180 tablet, Rfl: 1 .  mometasone-formoterol (DULERA) 100-5 MCG/ACT AERO, Inhale 2 puffs into the lungs 2 (two) times daily., Disp: 6 Inhaler, Rfl: 3 .   MULTIPLE VITAMIN PO, Take 1 tablet by mouth daily. , Disp: , Rfl:  .  omeprazole (PRILOSEC) 40 MG capsule, Take 1 capsule (40 mg total) by mouth daily., Disp: 90 capsule, Rfl: 1 .  ondansetron (ZOFRAN ODT) 4 MG disintegrating tablet, Take 1 tablet (4 mg total) by mouth every 8 (eight) hours as needed for nausea or vomiting., Disp: 25 tablet, Rfl: 3 .  ONETOUCH ULTRA test strip, USE 1 STRIP TO TEST GLUCOSE TWICE DAILY AS NEEDED, Disp: 100 strip, Rfl: 12 .  oxyCODONE-acetaminophen (PERCOCET/ROXICET) 5-325 MG tablet, Take 1 tablet by mouth every 6 (six) hours as needed for severe pain. (Patient not taking: Reported on 10/23/2019), Disp: 20 tablet, Rfl: 0 .  OXYGEN, Inhale 4 L into the lungs., Disp: , Rfl:  .  PARoxetine (PAXIL) 10 MG tablet, Take 1 tablet (10 mg total) by mouth at bedtime., Disp: 90 tablet, Rfl: 3 .  pravastatin (PRAVACHOL) 10 MG tablet, TAKE 1 TABLET (10 MG TOTAL) BY MOUTH DAILY., Disp: 90 tablet, Rfl: 3 .  predniSONE (STERAPRED UNI-PAK 21 TAB) 10 MG (21) TBPK tablet, Taper as directed. (Patient not taking: Reported on 12/10/2019), Disp: 21 tablet, Rfl: 0 .  roflumilast (DALIRESP) 500 MCG TABS tablet, Take 500 mcg by mouth daily. As needed, Disp: , Rfl:  .  tiotropium (SPIRIVA) 18 MCG inhalation capsule, Place into inhaler and inhale., Disp: , Rfl:   Patient Care Team: Jerrol Banana., MD as PCP - General (Family Medicine) Birder Robson, MD as Referring Physician (Ophthalmology) Yolonda Kida, MD as Consulting Physician (Cardiology) Cathi Roan, [facility] (Pharmacist) Allyne Gee, MD as Consulting Physician (Internal Medicine)     Objective:    Vitals: There were no vitals taken for this visit.  Physical Exam Vitals reviewed.  Constitutional:      Appearance: She is obese.  HENT:     Head: Normocephalic and atraumatic.     Right Ear: External ear normal.     Left Ear: External ear normal.  Eyes:     General: No scleral icterus.    Conjunctiva/sclera:  Conjunctivae normal.  Neck:     Vascular: No carotid bruit.  Cardiovascular:     Rate and Rhythm: Normal rate and regular rhythm.  Pulses: Normal pulses.     Heart sounds: Normal heart sounds.  Pulmonary:     Effort: Pulmonary effort is normal.     Breath sounds: Normal breath sounds.  Abdominal:     Palpations: Abdomen is soft. There is no mass.  Musculoskeletal:     Right lower leg: No edema.     Left lower leg: No edema.  Lymphadenopathy:     Cervical: No cervical adenopathy.  Skin:    General: Skin is warm and dry.  Neurological:     General: No focal deficit present.     Mental Status: She is alert and oriented to person, place, and time.  Psychiatric:        Mood and Affect: Mood normal.        Behavior: Behavior normal.        Thought Content: Thought content normal.        Judgment: Judgment normal.     Activities of Daily Living In your present state of health, do you have any difficulty performing the following activities: 12/10/2019  Hearing? N  Vision? N  Difficulty concentrating or making decisions? N  Walking or climbing stairs? Y  Comment Due to SOB.  Dressing or bathing? N  Doing errands, shopping? N  Preparing Food and eating ? N  Using the Toilet? N  In the past six months, have you accidently leaked urine? N  Do you have problems with loss of bowel control? N  Managing your Medications? N  Managing your Finances? N  Housekeeping or managing your Housekeeping? N  Some recent data might be hidden    Fall Risk Assessment Fall Risk  12/10/2019 09/15/2019 12/08/2018 10/15/2018 09/19/2018  Falls in the past year? 0 0 0 0 0  Number falls in past yr: 0 0 - - -  Injury with Fall? 0 0 - - -  Risk for fall due to : - Impaired balance/gait;Impaired mobility - - -  Follow up - Falls evaluation completed - - -     Depression Screen PHQ 2/9 Scores 12/10/2019 12/10/2019 09/15/2019 12/08/2018  PHQ - 2 Score 1 1 0 0  PHQ- 9 Score - - 1 -    6CIT Screen 12/10/2019   What Year? 0 points  What month? 0 points  What time? 0 points  Count back from 20 0 points  Months in reverse 0 points  Repeat phrase 2 points  Total Score 2       Assessment & Plan:    Annual Physical Reviewed patient's Family Medical History Reviewed and updated list of patient's medical providers Assessment of cognitive impairment was done Assessed patient's functional ability Established a written schedule for health screening [place] Completed and Reviewed  Exercise Activities and Dietary recommendations Goals    . "I can't afford this Dulera" (pt-stated)     Current Barriers:  . Financial Barriers  Pharmacist Clinical Goal(s):  Marland Kitchen Over the next 30 days, patient will work with CCM pharmacist and Dr. Humphrey Rolls to address needs related to medication assistance for [facility] and Proventil  Interventions: . Comprehensive medication review performed. . Collaboration with provider re: medication management (Dr. Humphrey Rolls of [facility] for medication assistance for [facility] provided by [employer]) . Reviewed options for medication assistance for [facility] and Proventil  Patient Self Care Activities:  . Self administers medications as prescribed . Calls pharmacy for medication refills   Please see past updates related to this goal by clicking on the "Past Updates"  button in the selected goal      . DIET - REDUCE PORTION SIZE     Recommend to continue with current diet regimen of cutting portion sizes in half and eating 3 healthy meals a day with 2 healthy snacks in between.     . Exercise 3x per week (30 min per time)     Recommend to exercise (walk) for 3 days a week for at least 30 minutes at a time.     . I just wanted to let you know I went to the hospital yesterday (pt-stated)     Current Barriers:  . Anxiety  Nurse Case Manager Clinical Goal(s):  Marland Kitchen Over the next 30 days, patient will experience decrease in ED visits. ED visits in last 6 months =  1  Interventions:  . Reviewed ED provider notes . Assessed for receipt and compliance with new prescription (zpack) . Assessed for additional respiratory symptoms . Discussed availability of practice after hours if needed and when unsure if ED is appropriate level of care . Discussed utilization of Ativan as prescribed for anxiety when needed and appropriate  Patient Self Care Activities:  . Self administers medications as prescribed . Attends all scheduled provider appointments . Calls pharmacy for medication refills . Attends church or other social activities . Performs ADL's independently . Performs IADL's independently . Calls provider office for new concerns or questions  Initial goal documentation      . Increase water intake     Starting 10/17/16, I will increase my water intake from 5 glasses a day to 6 glasses.       Immunization History  Administered Date(s) Administered  . Fluad Quad(high Dose 65+) 07/09/2019  . Influenza Split 07/23/2012  . Influenza, High Dose Seasonal PF 08/24/2014, 08/18/2015, 08/28/2016, 07/11/2017, 09/24/2018  . Influenza,inj,Quad PF,6+ Mos 07/29/2013  . Pneumococcal Conjugate-13 08/24/2014  . Pneumococcal Polysaccharide-23 11/03/1999, 07/23/2012  . Tdap 07/23/2012  . Zoster 07/23/2012    Health Maintenance  Topic Date Due  . FOOT EXAM  08/26/2018  . OPHTHALMOLOGY EXAM  06/14/2019  . HEMOGLOBIN A1C  03/14/2020  . TETANUS/TDAP  07/23/2022  . DEXA SCAN  01/13/2029  . INFLUENZA VACCINE  Completed  . PNA vac Low Risk Adult  Completed     Discussed health benefits of physical activity, and encouraged her to engage in regular exercise appropriate for her age and condition.   1. Encounter for annual physical examination excluding gynecological examination in a patient older than 17 years Patient declines pelvic or rectal exam. Probably anemia of chronic disease but if it drops any further must discuss with her whether we should do a GI  work-up.  Last colonoscopy 11 years ago.  Her overall poor health is a reason for not following up with routine screening. 2. Essential (primary) hypertension Controlled - CBC with Differential/Platelet - Comprehensive metabolic panel - TSH - Lipid panel  3. Other hyperlipidemia  - CBC with Differential/Platelet - Comprehensive metabolic panel - TSH - Lipid panel  4. Adult hypothyroidism  - CBC with Differential/Platelet - Comprehensive metabolic panel - TSH - Lipid panel  5. Chronic obstructive pulmonary disease, unspecified COPD type ([place]) Per pulmonary.  Patient in wheelchair on continuous O2.  She has had recent anemia.  - CBC with Differential/Platelet - Comprehensive metabolic panel - TSH - Lipid panel  6. Type 2 diabetes mellitus without complication, without long-term current use of insulin (HCC)  - POCT Glucose (Device for Home Use)  ------------------------------------------------------------------------------------------------------------ Trena Platt  Jusiah Aguayo,acting as a scribe for Wilhemena Durie, MD.,have documented all relevant documentation on the behalf of Wilhemena Durie, MD,as directed by  Wilhemena Durie, MD while in the presence of Wilhemena Durie, MD.   Wilhemena Durie, MD  [facility]

## 2019-12-15 ENCOUNTER — Ambulatory Visit (INDEPENDENT_AMBULATORY_CARE_PROVIDER_SITE_OTHER): Payer: Medicare Other | Admitting: Family Medicine

## 2019-12-15 ENCOUNTER — Encounter: Payer: Self-pay | Admitting: Family Medicine

## 2019-12-15 ENCOUNTER — Ambulatory Visit: Payer: Medicare Other

## 2019-12-15 ENCOUNTER — Other Ambulatory Visit: Payer: Self-pay

## 2019-12-15 VITALS — BP 134/81 | HR 91 | Temp 97.1°F | Wt 218.0 lb

## 2019-12-15 DIAGNOSIS — E7849 Other hyperlipidemia: Secondary | ICD-10-CM | POA: Diagnosis not present

## 2019-12-15 DIAGNOSIS — E119 Type 2 diabetes mellitus without complications: Secondary | ICD-10-CM

## 2019-12-15 DIAGNOSIS — Z0000 Encounter for general adult medical examination without abnormal findings: Secondary | ICD-10-CM

## 2019-12-15 DIAGNOSIS — J449 Chronic obstructive pulmonary disease, unspecified: Secondary | ICD-10-CM | POA: Diagnosis not present

## 2019-12-15 DIAGNOSIS — E039 Hypothyroidism, unspecified: Secondary | ICD-10-CM | POA: Diagnosis not present

## 2019-12-15 DIAGNOSIS — I10 Essential (primary) hypertension: Secondary | ICD-10-CM

## 2019-12-15 DIAGNOSIS — Z6835 Body mass index (BMI) 35.0-35.9, adult: Secondary | ICD-10-CM

## 2019-12-15 LAB — POCT GLUCOSE (DEVICE FOR HOME USE): Glucose Fasting, POC: 108 mg/dL — AB (ref 70–99)

## 2019-12-15 NOTE — Patient Instructions (Addendum)
For Constipation try Colace & Glycolax !!!!

## 2019-12-16 DIAGNOSIS — J449 Chronic obstructive pulmonary disease, unspecified: Secondary | ICD-10-CM | POA: Diagnosis not present

## 2019-12-16 LAB — CBC WITH DIFFERENTIAL/PLATELET
Basophils Absolute: 0 10*3/uL (ref 0.0–0.2)
Basos: 0 %
EOS (ABSOLUTE): 0.2 10*3/uL (ref 0.0–0.4)
Eos: 3 %
Hematocrit: 27.9 % — ABNORMAL LOW (ref 34.0–46.6)
Hemoglobin: 8.8 g/dL — ABNORMAL LOW (ref 11.1–15.9)
Immature Grans (Abs): 0 10*3/uL (ref 0.0–0.1)
Immature Granulocytes: 0 %
Lymphocytes Absolute: 1.2 10*3/uL (ref 0.7–3.1)
Lymphs: 15 %
MCH: 29.1 pg (ref 26.6–33.0)
MCHC: 31.5 g/dL (ref 31.5–35.7)
MCV: 92 fL (ref 79–97)
Monocytes Absolute: 0.6 10*3/uL (ref 0.1–0.9)
Monocytes: 8 %
Neutrophils Absolute: 5.5 10*3/uL (ref 1.4–7.0)
Neutrophils: 74 %
Platelets: 211 10*3/uL (ref 150–450)
RBC: 3.02 x10E6/uL — ABNORMAL LOW (ref 3.77–5.28)
RDW: 11.9 % (ref 11.7–15.4)
WBC: 7.6 10*3/uL (ref 3.4–10.8)

## 2019-12-16 LAB — COMPREHENSIVE METABOLIC PANEL
ALT: 3 IU/L (ref 0–32)
AST: 11 IU/L (ref 0–40)
Albumin/Globulin Ratio: 2 (ref 1.2–2.2)
Albumin: 4.2 g/dL (ref 3.7–4.7)
Alkaline Phosphatase: 73 IU/L (ref 39–117)
BUN/Creatinine Ratio: 20 (ref 12–28)
BUN: 11 mg/dL (ref 8–27)
Bilirubin Total: <0.2 mg/dL (ref 0.0–1.2)
CO2: 33 mmol/L — ABNORMAL HIGH (ref 20–29)
Calcium: 9.3 mg/dL (ref 8.7–10.3)
Chloride: 97 mmol/L (ref 96–106)
Creatinine, Ser: 0.56 mg/dL — ABNORMAL LOW (ref 0.57–1.00)
GFR calc Af Amer: 103 mL/min/{1.73_m2} (ref >59)
GFR calc non Af Amer: 90 mL/min/{1.73_m2} (ref >59)
Globulin, Total: 2.1 g/dL (ref 1.5–4.5)
Glucose: 105 mg/dL — ABNORMAL HIGH (ref 65–99)
Potassium: 4.5 mmol/L (ref 3.5–5.2)
Sodium: 143 mmol/L (ref 134–144)
Total Protein: 6.3 g/dL (ref 6.0–8.5)

## 2019-12-16 LAB — LIPID PANEL
Chol/HDL Ratio: 3.2 ratio (ref 0.0–4.4)
Cholesterol, Total: 183 mg/dL (ref 100–199)
HDL: 58 mg/dL (ref >39)
LDL Chol Calc (NIH): 104 mg/dL — ABNORMAL HIGH (ref 0–99)
Triglycerides: 117 mg/dL (ref 0–149)
VLDL Cholesterol Cal: 21 mg/dL (ref 5–40)

## 2019-12-16 LAB — TSH: TSH: 0.501 u[IU]/mL (ref 0.450–4.500)

## 2019-12-17 ENCOUNTER — Telehealth: Payer: Self-pay | Admitting: Family Medicine

## 2019-12-17 DIAGNOSIS — J309 Allergic rhinitis, unspecified: Secondary | ICD-10-CM

## 2019-12-17 NOTE — Telephone Encounter (Signed)
Pt seen dr Sullivan Lone on 12-15-2019 and she forgot to mention she is having sinus issues again and would like refill on what md normally prescribes. [facility]

## 2019-12-18 MED ORDER — LORATADINE 10 MG PO TABS: 10.0000 mg | ORAL_TABLET | Freq: Every day | ORAL | 3 refills | Status: DC

## 2019-12-18 NOTE — Telephone Encounter (Signed)
Patient was advised.  

## 2019-12-18 NOTE — Telephone Encounter (Signed)
Patient was advised. Patient stated she was not requesting an antibiotic. She was requesting an allergy medication. Please advise?

## 2019-12-18 NOTE — Telephone Encounter (Signed)
Treat with saline rinses and possible Flonase nasal spray.  Antibiotics are not indicated.

## 2019-12-18 NOTE — Telephone Encounter (Signed)
Flonase and Loaratdine 10mg  daily

## 2019-12-20 ENCOUNTER — Ambulatory Visit: Payer: Medicare Other | Attending: Internal Medicine

## 2019-12-20 DIAGNOSIS — Z23 Encounter for immunization: Secondary | ICD-10-CM | POA: Insufficient documentation

## 2019-12-20 NOTE — Progress Notes (Signed)
   Covid-19 Vaccination Clinic  Name:  Kristen Schroeder    MRN: [id number] DOB: Aug 07, 1941  12/20/2019  [name] was observed post Covid-19 immunization for 15 minutes without incidence. She was provided with Vaccine Information Sheet and instruction to access the V-Safe system.   [name] was instructed to call 911 with any severe reactions post vaccine: Marland Kitchen Difficulty breathing  . Swelling of your face and throat  . A fast heartbeat  . A bad rash all over your body  . Dizziness and weakness    Immunizations Administered    Name Date Dose VIS Date Route   Pfizer COVID-19 Vaccine 12/20/2019 10:10 AM 0.3 mL 10/16/2019 Intramuscular   Manufacturer: [employer], [employer]   Lot: [id number]   NDC: [address]-9

## 2019-12-21 ENCOUNTER — Telehealth: Payer: Self-pay

## 2019-12-21 NOTE — Telephone Encounter (Signed)
Patient was advised.  

## 2019-12-21 NOTE — Telephone Encounter (Signed)
-----   Message from Maple Hudson., MD sent at 12/20/2019  5:55 PM EST ----- Blood count stable.  Repeat CBC on follow-up visit.

## 2020-01-03 ENCOUNTER — Other Ambulatory Visit: Payer: Self-pay | Admitting: Adult Health

## 2020-01-03 DIAGNOSIS — J449 Chronic obstructive pulmonary disease, unspecified: Secondary | ICD-10-CM | POA: Diagnosis not present

## 2020-01-12 ENCOUNTER — Ambulatory Visit: Payer: Medicare Other | Attending: Internal Medicine

## 2020-01-12 DIAGNOSIS — Z23 Encounter for immunization: Secondary | ICD-10-CM | POA: Insufficient documentation

## 2020-01-12 NOTE — Progress Notes (Signed)
   Covid-19 Vaccination Clinic  Name:  Kristen Schroeder    MRN: [id number] DOB: 1941-08-07  01/12/2020  [name] was observed post Covid-19 immunization for 15 minutes without incident. She was provided with Vaccine Information Sheet and instruction to access the V-Safe system.   [name] was instructed to call 911 with any severe reactions post vaccine: Marland Kitchen Difficulty breathing  . Swelling of face and throat  . A fast heartbeat  . A bad rash all over body  . Dizziness and weakness   Immunizations Administered    Name Date Dose VIS Date Route   Pfizer COVID-19 Vaccine 01/12/2020 10:01 AM 0.3 mL 10/16/2019 Intramuscular   Manufacturer: [employer], [employer]   Lot: [id number]   NDC: [address]-9

## 2020-01-13 DIAGNOSIS — J449 Chronic obstructive pulmonary disease, unspecified: Secondary | ICD-10-CM | POA: Diagnosis not present

## 2020-01-15 NOTE — Progress Notes (Signed)
This encounter was created in error - please disregard.

## 2020-01-20 ENCOUNTER — Ambulatory Visit (INDEPENDENT_AMBULATORY_CARE_PROVIDER_SITE_OTHER): Payer: Medicare Other | Admitting: Family Medicine

## 2020-01-20 DIAGNOSIS — F329 Major depressive disorder, single episode, unspecified: Secondary | ICD-10-CM | POA: Diagnosis not present

## 2020-01-20 DIAGNOSIS — U071 COVID-19: Secondary | ICD-10-CM | POA: Diagnosis not present

## 2020-01-20 DIAGNOSIS — F419 Anxiety disorder, unspecified: Secondary | ICD-10-CM | POA: Diagnosis not present

## 2020-01-20 MED ORDER — SERTRALINE HCL 50 MG PO TABS: 50.0000 mg | ORAL_TABLET | Freq: Every day | ORAL | 11 refills | Status: DC

## 2020-01-20 NOTE — Progress Notes (Signed)
Patient: Kristen Schroeder Female    DOB: [birth year]   79 y.o.   MRN: [id number] Visit Date: 01/20/2020  Today's Provider: Wilhemena Durie, MD   No chief complaint on file.  Subjective:    Virtual Visit via Telephone Note  I connected with Kristen Schroeder on 01/20/20 at 10:40 AM EDT by telephone and verified that I am speaking with the correct person using two identifiers.  Location Patient: Home Provider: Office   I discussed the limitations, risks, security and privacy concerns of performing an evaluation and management service by telephone and the availability of in person appointments. I also discussed with the patient that there may be a patient responsible charge related to this service. The patient expressed understanding and agreed to proceed.    HPI  Patient has questions for PCP.  Patient has issues with anxiety and feels isolated with the Covid endemic.  She is at her vaccine  Allergies  Allergen Reactions  . Codeine Nausea And Vomiting and Nausea Only     Current Outpatient Medications:  .  albuterol (PROVENTIL HFA;VENTOLIN HFA) 108 (90 Base) MCG/ACT inhaler, Inhale 2 puff every 4-6 hrs as needed for SOB (Patient not taking: Reported on 12/15/2019), Disp: 1 Inhaler, Rfl: 3 .  arformoterol (BROVANA) 15 MCG/2ML NEBU, Take 2 mLs (15 mcg total) by nebulization 2 (two) times daily. (Patient not taking: Reported on 12/15/2019), Disp: 120 mL, Rfl: 11 .  aspirin EC 81 MG tablet, Take 81 mg by mouth daily., Disp: , Rfl:  .  azithromycin (ZITHROMAX Z-PAK) 250 MG tablet, 2 tabs PO on day 1, then 1 tab PO daily for 4 days (Patient not taking: Reported on 11/23/2019), Disp: 6 each, Rfl: 2 .  benzonatate (TESSALON) 200 MG capsule, Take 1 capsule (200 mg total) by mouth 3 (three) times daily as needed. (Patient not taking: Reported on 11/16/2019), Disp: 30 capsule, Rfl: 0 .  blood glucose meter kit and supplies KIT, Dispense based on patient and insurance preference. Use up to four  times daily as directed. (FOR ICD-9 250.00, 250.01)., Disp: 1 each, Rfl: 0 .  Blood Glucose Monitoring Suppl (ONE TOUCH ULTRA 2) w/Device KIT, 1 each by Does not apply route daily., Disp: 1 each, Rfl: 0 .  enalapril (VASOTEC) 20 MG tablet, Take 1 tablet (20 mg total) by mouth daily., Disp: 90 tablet, Rfl: 3 .  fluticasone (FLONASE) 50 MCG/ACT nasal spray, Place 1 spray into both nostrils daily as needed. , Disp: , Rfl:  .  furosemide (LASIX) 20 MG tablet, TAKE 1 TABLET BY MOUTH EVERY DAY, Disp: 90 tablet, Rfl: 1 .  gabapentin (NEURONTIN) 300 MG capsule, TAKE 2 CAPSULES IN THE MORNING, 1 CAPSULE IN THE AFTERNOON AND 2 CAPSULES IN THE EVENING, Disp: 450 capsule, Rfl: 3 .  HYDROcodone-acetaminophen (NORCO) 10-325 MG tablet, Take by mouth., Disp: , Rfl:  .  ipratropium-albuterol (DUONEB) 0.5-2.5 (3) MG/3ML SOLN, TAKE 3 MLS BY NEBULIZATION EVERY 6 (SIX) HOURS AS NEEDED., Disp: 360 mL, Rfl: 1 .  isosorbide mononitrate (IMDUR) 30 MG 24 hr tablet, Take 30 mg by mouth daily as needed (high bp)., Disp: , Rfl:  .  levothyroxine (SYNTHROID) 125 MCG tablet, TAKE 1 TABLET BY MOUTH DAILY, Disp: 90 tablet, Rfl: 1 .  lisinopril (ZESTRIL) 20 MG tablet, Take by mouth., Disp: , Rfl:  .  loratadine (CLARITIN) 10 MG tablet, Take 1 tablet (10 mg total) by mouth daily., Disp: 90 tablet, Rfl: 3 .  LORazepam (  ATIVAN) 0.5 MG tablet, Take 1 tablet (0.5 mg total) by mouth 2 (two) times daily as needed for anxiety., Disp: 180 tablet, Rfl: 3 .  meclizine (ANTIVERT) 12.5 MG tablet, Take 1 tablet (12.5 mg total) by mouth 3 (three) times daily as needed for dizziness., Disp: 30 tablet, Rfl: 0 .  metFORMIN (GLUCOPHAGE) 1000 MG tablet, TAKE 1 TABLET BY MOUTH TWICE A DAY (Patient taking differently: Take 1,000 mg by mouth daily with breakfast. ), Disp: 180 tablet, Rfl: 1 .  mometasone-formoterol (DULERA) 100-5 MCG/ACT AERO, Inhale 2 puffs into the lungs 2 (two) times daily., Disp: 6 Inhaler, Rfl: 3 .  MULTIPLE VITAMIN PO, Take 1 tablet  by mouth daily. , Disp: , Rfl:  .  omeprazole (PRILOSEC) 40 MG capsule, Take 1 capsule (40 mg total) by mouth daily., Disp: 90 capsule, Rfl: 1 .  ondansetron (ZOFRAN ODT) 4 MG disintegrating tablet, Take 1 tablet (4 mg total) by mouth every 8 (eight) hours as needed for nausea or vomiting., Disp: 25 tablet, Rfl: 3 .  ONETOUCH ULTRA test strip, USE 1 STRIP TO TEST GLUCOSE TWICE DAILY AS NEEDED, Disp: 100 strip, Rfl: 12 .  oxyCODONE-acetaminophen (PERCOCET/ROXICET) 5-325 MG tablet, Take 1 tablet by mouth every 6 (six) hours as needed for severe pain. (Patient not taking: Reported on 10/23/2019), Disp: 20 tablet, Rfl: 0 .  OXYGEN, Inhale 4 L into the lungs., Disp: , Rfl:  .  PARoxetine (PAXIL) 10 MG tablet, Take 1 tablet (10 mg total) by mouth at bedtime. (Patient not taking: Reported on 12/15/2019), Disp: 90 tablet, Rfl: 3 .  pravastatin (PRAVACHOL) 10 MG tablet, TAKE 1 TABLET (10 MG TOTAL) BY MOUTH DAILY., Disp: 90 tablet, Rfl: 3 .  predniSONE (STERAPRED UNI-PAK 21 TAB) 10 MG (21) TBPK tablet, Taper as directed. (Patient not taking: Reported on 12/10/2019), Disp: 21 tablet, Rfl: 0 .  roflumilast (DALIRESP) 500 MCG TABS tablet, Take 500 mcg by mouth daily. As needed, Disp: , Rfl:  .  tiotropium (SPIRIVA) 18 MCG inhalation capsule, Place into inhaler and inhale., Disp: , Rfl:   Review of Systems  Social History   Tobacco Use  . Smoking status: Former Smoker    Packs/day: 0.50    Years: 15.00    Pack years: 7.50  . Smokeless tobacco: Never Used  . Tobacco comment: 30-40 years ago  Substance Use Topics  . Alcohol use: No    Alcohol/week: 0.0 standard drinks      Objective:   There were no vitals taken for this visit. There were no vitals filed for this visit.There is no height or weight on file to calculate BMI.   Physical Exam   No results found for any visits on 01/20/20.     Assessment & Plan     1. Anxiety Start sertraline 50 mg daily.  Daughter will take about a month. -  sertraline (ZOLOFT) 50 MG tablet; Take 1 tablet (50 mg total) by mouth daily.  Dispense: 30 tablet; Refill: 11  2. COVID-19 virus detected Patient has had previous Covid infection.  Is doing well.  Was last year.  He has had her since vaccine.  3. Reactive depression  - sertraline (ZOLOFT) 50 MG tablet; Take 1 tablet (50 mg total) by mouth daily.  Dispense: 30 tablet; Refill: 11  I discussed the assessment and treatment plan with the patient. The patient was provided an opportunity to ask questions and all were answered. The patient agreed with the plan and demonstrated an understanding of the  instructions.   The patient was advised to call back or seek an in-person evaluation if the symptoms worsen or if the condition fails to improve as anticipated.  I provided  10 minutes of non-face-to-face time during this encounter.     Richard Cranford Mon, MD  [facility]

## 2020-01-31 DIAGNOSIS — J449 Chronic obstructive pulmonary disease, unspecified: Secondary | ICD-10-CM | POA: Diagnosis not present

## 2020-02-13 DIAGNOSIS — J449 Chronic obstructive pulmonary disease, unspecified: Secondary | ICD-10-CM | POA: Diagnosis not present

## 2020-02-23 ENCOUNTER — Ambulatory Visit: Payer: Self-pay | Admitting: Family Medicine

## 2020-02-23 NOTE — Progress Notes (Signed)
Established patient visit    Patient: Kristen Schroeder   DOB: May 23, 1941   79 y.o. Female  MRN: [id number] Visit Date: 02/29/2020  Today's healthcare provider: Wilhemena Durie, MD   Chief Complaint  Patient presents with  . Follow-up  . Depression   Subjective    HPI  Patient is slowly getting weaker with time.  She moves about the house with a Rollator.  She has had no falls recently. She has some tremulousness of her legs and arms.  She states that she needs a glucometer and lancets and strips to check her sugar at home.  She recently got a phone call from a [place] healthcare nurse evidently.  She uses nasal cannula oxygen around-the-clock. Depression, Follow-up  She  was last seen for this 1 months ago. Changes made at last visit include; Started sertraline 50 mg daily.  She reports good compliance with treatment. She is not having side effects.   She reports good tolerance of treatment. Current symptoms include: fatigue She feels she is Unchanged since last visit.  Depression screen [facility] 2/9 12/15/2019 12/10/2019 12/10/2019  Decreased Interest 0 0 0  Down, Depressed, Hopeless _0 PHQ - 2 Score _1 Altered sleeping 0 - -  Tired, decreased energy 3 - -  Change in appetite 3 - -  Feeling bad or failure about yourself  0 - -  Trouble concentrating 0 - -  Moving slowly or fidgety/restless 0 - -  Suicidal thoughts 0 - -  PHQ-9 Score 7 - -  Difficult doing work/chores Somewhat difficult - -    --------------------------------------------------------------------  Patient states she is only talking sertraline when she needs it, not daily. Patient states when she does take it she is doing well with it.      Medications: Outpatient Medications Prior to Visit  Medication Sig  . aspirin EC 81 MG tablet Take 81 mg by mouth daily.  . blood glucose meter kit and supplies KIT Dispense based on patient and insurance preference. Use up to four times daily as directed. (FOR  ICD-9 250.00, 250.01).  . Blood Glucose Monitoring Suppl (ONE TOUCH ULTRA 2) w/Device KIT 1 each by Does not apply route daily.  . enalapril (VASOTEC) 20 MG tablet TAKE 1 TABLET BY MOUTH EVERY DAY  . fluticasone (FLONASE) 50 MCG/ACT nasal spray Place 1 spray into both nostrils daily as needed.   . furosemide (LASIX) 20 MG tablet TAKE 1 TABLET BY MOUTH EVERY DAY  . gabapentin (NEURONTIN) 300 MG capsule TAKE 2 CAPSULES IN THE MORNING, 1 CAPSULE IN THE AFTERNOON AND 2 CAPSULES IN THE EVENING  . HYDROcodone-acetaminophen (NORCO) 10-325 MG tablet Take by mouth.  Marland Kitchen ipratropium-albuterol (DUONEB) 0.5-2.5 (3) MG/3ML SOLN TAKE 3 MLS BY NEBULIZATION EVERY 6 (SIX) HOURS AS NEEDED.  Marland Kitchen isosorbide mononitrate (IMDUR) 30 MG 24 hr tablet Take 30 mg by mouth daily as needed (high bp).  Marland Kitchen levothyroxine (SYNTHROID) 125 MCG tablet TAKE 1 TABLET BY MOUTH DAILY  . lisinopril (ZESTRIL) 20 MG tablet Take by mouth.  . loratadine (CLARITIN) 10 MG tablet Take 1 tablet (10 mg total) by mouth daily.  Marland Kitchen LORazepam (ATIVAN) 0.5 MG tablet Take 1 tablet (0.5 mg total) by mouth 2 (two) times daily as needed for anxiety.  . meclizine (ANTIVERT) 12.5 MG tablet Take 1 tablet (12.5 mg total) by mouth 3 (three) times daily as needed for dizziness.  . metFORMIN (GLUCOPHAGE) 1000 MG tablet TAKE 1 TABLET BY  MOUTH TWICE A DAY (Patient taking differently: Take 1,000 mg by mouth daily with breakfast. )  . mometasone-formoterol (DULERA) 100-5 MCG/ACT AERO Inhale 2 puffs into the lungs 2 (two) times daily.  . MULTIPLE VITAMIN PO Take 1 tablet by mouth daily.   Marland Kitchen omeprazole (PRILOSEC) 40 MG capsule Take 1 capsule (40 mg total) by mouth daily.  . ondansetron (ZOFRAN ODT) 4 MG disintegrating tablet Take 1 tablet (4 mg total) by mouth every 8 (eight) hours as needed for nausea or vomiting.  Glory Rosebush ULTRA test strip USE 1 STRIP TO TEST GLUCOSE TWICE DAILY AS NEEDED  . OXYGEN Inhale 4 L into the lungs.  Marland Kitchen PARoxetine (PAXIL) 10 MG tablet Take 1  tablet (10 mg total) by mouth at bedtime.  . pravastatin (PRAVACHOL) 10 MG tablet TAKE 1 TABLET (10 MG TOTAL) BY MOUTH DAILY.  . roflumilast (DALIRESP) 500 MCG TABS tablet Take 500 mcg by mouth daily. As needed  . sertraline (ZOLOFT) 50 MG tablet Take 1 tablet (50 mg total) by mouth daily.  Marland Kitchen tiotropium (SPIRIVA) 18 MCG inhalation capsule Place into inhaler and inhale.  . albuterol (PROVENTIL HFA;VENTOLIN HFA) 108 (90 Base) MCG/ACT inhaler Inhale 2 puff every 4-6 hrs as needed for SOB (Patient not taking: Reported on 12/15/2019)  . arformoterol (BROVANA) 15 MCG/2ML NEBU Take 2 mLs (15 mcg total) by nebulization 2 (two) times daily. (Patient not taking: Reported on 12/15/2019)  . azithromycin (ZITHROMAX Z-PAK) 250 MG tablet 2 tabs PO on day 1, then 1 tab PO daily for 4 days (Patient not taking: Reported on 11/23/2019)  . benzonatate (TESSALON) 200 MG capsule Take 1 capsule (200 mg total) by mouth 3 (three) times daily as needed. (Patient not taking: Reported on 11/16/2019)  . oxyCODONE-acetaminophen (PERCOCET/ROXICET) 5-325 MG tablet Take 1 tablet by mouth every 6 (six) hours as needed for severe pain. (Patient not taking: Reported on 10/23/2019)  . predniSONE (STERAPRED UNI-PAK 21 TAB) 10 MG (21) TBPK tablet Taper as directed. (Patient not taking: Reported on 12/10/2019)   No facility-administered medications prior to visit.    Review of Systems  Constitutional: Negative for appetite change, chills, fatigue and fever.  HENT: Negative.   Eyes: Negative.   Respiratory: Negative for chest tightness and shortness of breath.   Cardiovascular: Negative for chest pain and palpitations.  Gastrointestinal: Negative for abdominal pain, nausea and vomiting.  Endocrine: Negative.   Musculoskeletal: Positive for arthralgias, back pain and gait problem.  Allergic/Immunologic: Negative.   Neurological: Positive for weakness. Negative for dizziness.  Hematological: Negative.   Psychiatric/Behavioral: Negative.       Last hemoglobin A1c Lab Results  Component Value Date   HGBA1C 5.2 02/29/2020       Objective    BP 131/73 (BP Location: Right Arm, Patient Position: Sitting, Cuff Size: Large)   Pulse 94   Temp 97.7 F (36.5 C) (Other (Comment))   Resp 18   Ht _0  (1.702 m)   Wt 219 lb (99.3 kg)   SpO2 92%   BMI 34.30 kg/m  BP Readings from Last 3 Encounters:  02/29/20 131/73  12/15/19 134/81  11/16/19 119/62   Wt Readings from Last 3 Encounters:  02/29/20 219 lb (99.3 kg)  12/15/19 218 lb (98.9 kg)  09/15/19 223 lb (101.2 kg)      Physical Exam Vitals and nursing note reviewed.  Constitutional:      Appearance: Normal appearance. She is normal weight.  HENT:     Right Ear: Tympanic membrane  normal.     Left Ear: Tympanic membrane normal.     Nose: Nose normal.     Mouth/Throat:     Mouth: Mucous membranes are moist.     Pharynx: Oropharynx is clear.  Eyes:     General: No scleral icterus.    Conjunctiva/sclera: Conjunctivae normal.  Cardiovascular:     Rate and Rhythm: Normal rate and regular rhythm.     Pulses: Normal pulses.     Heart sounds: Normal heart sounds.  Pulmonary:     Effort: Pulmonary effort is normal.     Breath sounds: Normal breath sounds.  Abdominal:     Palpations: Abdomen is soft.  Musculoskeletal:     Cervical back: Normal range of motion and neck supple.  Skin:    General: Skin is warm and dry.  Neurological:     Mental Status: She is alert and oriented to person, place, and time.     Gait: Gait abnormal.     Comments: No cogwheeling on exam.  Strength appears to be normal.  She does have a shuffling gait when I walked her across the room and back.  No hesitation with getting up or with walking.  Psychiatric:        Mood and Affect: Mood normal.        Behavior: Behavior normal.        Thought Content: Thought content normal.        Judgment: Judgment normal.       Results for orders placed or performed in visit on 02/29/20  POCT  HgB A1C  Result Value Ref Range   Hemoglobin A1C 5.2 4.0 - 5.6 %   Est. average glucose Bld gHb Est-mCnc 103      Assessment & Plan    1. Type 2 diabetes mellitus without complication, without long-term current use of insulin (HCC) Excellent control.  Now prediabetic. - POCT HgB A1C 5.2.  2. Essential (primary) hypertension Trolled on enalapril.  3. Aneurysm ([place]) Cerebral aneurysm, follow clinically.  4. Obstructive apnea On CPAP  5. Gastroesophageal reflux disease, unspecified whether esophagitis present   6. Class 1 obesity due to excess calories with serious comorbidity and body mass index (BMI) of 33.0 to 33.9 in adult Weight loss has been discussed with the patient. She had Covid and has had both Covid vaccines. 7. Type 2 diabetes mellitus with mild nonproliferative retinopathy without macular edema, without long-term current use of insulin, unspecified laterality ([place]) Also with mild neuropathy.I, Wilhemena Durie, MD, have reviewed all documentation for this visit. The documentation on 03/01/20 for the exam, diagnosis, procedures, and orders are all accurate and complete. 8.  Gait disturbance I think this is multifactorial and I do not see any indications of a movement disorder.  Follow this.  Currently her fall risk is higher at this time but I think she is moving around the house with a Rollator.  She had   Return in about 3 months (around 05/30/2020).         Verl Whitmore Cranford Mon, MD  [facility] [phone] (phone) [phone] (fax)  [place]

## 2020-02-25 ENCOUNTER — Other Ambulatory Visit: Payer: Self-pay | Admitting: Family Medicine

## 2020-02-29 ENCOUNTER — Ambulatory Visit (INDEPENDENT_AMBULATORY_CARE_PROVIDER_SITE_OTHER): Payer: Medicare Other | Admitting: Family Medicine

## 2020-02-29 ENCOUNTER — Encounter: Payer: Self-pay | Admitting: Family Medicine

## 2020-02-29 ENCOUNTER — Other Ambulatory Visit: Payer: Self-pay

## 2020-02-29 VITALS — BP 131/73 | HR 94 | Temp 97.7°F | Resp 18 | Ht 67.0 in | Wt 219.0 lb

## 2020-02-29 DIAGNOSIS — I729 Aneurysm of unspecified site: Secondary | ICD-10-CM | POA: Diagnosis not present

## 2020-02-29 DIAGNOSIS — I10 Essential (primary) hypertension: Secondary | ICD-10-CM | POA: Diagnosis not present

## 2020-02-29 DIAGNOSIS — G4733 Obstructive sleep apnea (adult) (pediatric): Secondary | ICD-10-CM | POA: Diagnosis not present

## 2020-02-29 DIAGNOSIS — E113299 Type 2 diabetes mellitus with mild nonproliferative diabetic retinopathy without macular edema, unspecified eye: Secondary | ICD-10-CM

## 2020-02-29 DIAGNOSIS — E6609 Other obesity due to excess calories: Secondary | ICD-10-CM

## 2020-02-29 DIAGNOSIS — Z6833 Body mass index (BMI) 33.0-33.9, adult: Secondary | ICD-10-CM

## 2020-02-29 DIAGNOSIS — E119 Type 2 diabetes mellitus without complications: Secondary | ICD-10-CM

## 2020-02-29 DIAGNOSIS — R269 Unspecified abnormalities of gait and mobility: Secondary | ICD-10-CM

## 2020-02-29 DIAGNOSIS — K219 Gastro-esophageal reflux disease without esophagitis: Secondary | ICD-10-CM | POA: Diagnosis not present

## 2020-02-29 LAB — POCT GLYCOSYLATED HEMOGLOBIN (HGB A1C)
Est. average glucose Bld gHb Est-mCnc: 103
Hemoglobin A1C: 5.2 % (ref 4.0–5.6)

## 2020-03-02 DIAGNOSIS — J449 Chronic obstructive pulmonary disease, unspecified: Secondary | ICD-10-CM | POA: Diagnosis not present

## 2020-03-06 ENCOUNTER — Other Ambulatory Visit: Payer: Self-pay | Admitting: Family Medicine

## 2020-03-06 DIAGNOSIS — R600 Localized edema: Secondary | ICD-10-CM

## 2020-03-06 NOTE — Telephone Encounter (Signed)
Requested Prescriptions  Pending Prescriptions Disp Refills  . furosemide (LASIX) 20 MG tablet [Pharmacy Med Name: FUROSEMIDE 20 MG TABLET] 90 tablet 1    Sig: TAKE 1 TABLET BY MOUTH EVERY DAY     Cardiovascular:  Diuretics - Loop Failed - 03/06/2020 12:57 AM      Failed - Cr in normal range and within 360 days    Creatinine, Ser  Date Value Ref Range Status  12/15/2019 0.56 (L) 0.57 - 1.00 mg/dL Final         Passed - K in normal range and within 360 days    Potassium  Date Value Ref Range Status  12/15/2019 4.5 3.5 - 5.2 mmol/L Final         Passed - Ca in normal range and within 360 days    Calcium  Date Value Ref Range Status  12/15/2019 9.3 8.7 - 10.3 mg/dL Final         Passed - Na in normal range and within 360 days    Sodium  Date Value Ref Range Status  12/15/2019 143 134 - 144 mmol/L Final         Passed - Last BP in normal range    BP Readings from Last 1 Encounters:  02/29/20 131/73         Passed - Valid encounter within last 6 months    Recent Outpatient Visits          6 days ago Type 2 diabetes mellitus without complication, without long-term current use of insulin [facility])   [name] Maple Hudson., MD   1 month ago Anxiety   [facility] Maple Hudson., MD   2 months ago Encounter for annual physical examination excluding gynecological examination in a patient older than 17 years   [facility] [place]one, Leonette Monarch., MD   3 months ago Dark stools   [facility] [place], [place], [place]   3 months ago Nausea   [facility] [place], Marzella Schlein, MD      Future Appointments            In 2 months Maple Hudson., MD [facility], [facility]

## 2020-03-07 ENCOUNTER — Ambulatory Visit: Payer: Self-pay | Admitting: Family Medicine

## 2020-03-07 NOTE — Telephone Encounter (Signed)
Pt reports intermittent nausea, onset Wednesday 03/02/2020. Saw Dr. Sullivan Lone 02/29/2020, states "I  felt good then." No new meds.  Reports nausea "Comes and goes." No nausea over weekend, did feel nauseated this am, not presently.  Denies vomiting, no diarrhea, no fever, no abdominal pain or cramping. Reports "Little dizzy one time and took antivert I take sometimes, went away."  Only episode, no dizziness, lightheadedness presently.  Also states has been taking Pepto Bismol at times, "Helps." States is staying hydrated, tolerating small bland meals. States "Just thought I should tell Dr. [place] Lone."  Care advise given per protocol, assured NT would route to practice for PCPs review. After hours call.  CB# [phone]   Reason for Disposition . Unexplained nausea  Answer Assessment - Initial Assessment Questions 1. NAUSEA SEVERITY: "How bad is the nausea?" (e.g., mild, moderate, severe; dehydration, weight loss)   - MILD: loss of appetite without change in eating habits   - MODERATE: decreased oral intake without significant weight loss, dehydration, or malnutrition   - SEVERE: inadequate caloric or fluid intake, significant weight loss, symptoms of dehydration    Decreased appetite "But eating ok, drinking a lot of water." 2. ONSET: "When did the nausea begin?"     Wednesday 4/28 3. VOMITING: "Any vomiting?" If so, ask: "How many times today?"     no 4. RECURRENT SYMPTOM: "Have you had nausea before?" If so, ask: "When was the last time?" "What happened that time?"     No 5. CAUSE: "What do you think is causing the nausea?"     "Don't know."  Protocols used: NAUSEA-A-AH

## 2020-03-08 NOTE — Telephone Encounter (Signed)
Okay if meclizine is working.  If she is really nauseated maybe we can work her in with [facility] or with Maurine Minister, if she is really feeling sick or getting worse.

## 2020-03-08 NOTE — Telephone Encounter (Signed)
Please advise 

## 2020-03-08 NOTE — Telephone Encounter (Signed)
Spoke to patient she said that the meclizine is helping and she feels a little better today, I advised patient to call back if she starts feeling bad again.

## 2020-03-14 DIAGNOSIS — J449 Chronic obstructive pulmonary disease, unspecified: Secondary | ICD-10-CM | POA: Diagnosis not present

## 2020-03-15 ENCOUNTER — Ambulatory Visit: Payer: Self-pay | Admitting: Family Medicine

## 2020-03-23 ENCOUNTER — Other Ambulatory Visit: Payer: Self-pay | Admitting: Family Medicine

## 2020-03-23 DIAGNOSIS — R110 Nausea: Secondary | ICD-10-CM

## 2020-03-23 NOTE — Telephone Encounter (Signed)
Requested medication (s) are due for refill today - yes  Requested medication (s) are on the active medication list -yes  Future visit scheduled -yes  Last refill: 01/21/20  Notes to clinic: request for non delegated Rx  Requested Prescriptions  Pending Prescriptions Disp Refills   ondansetron (ZOFRAN-ODT) 4 MG disintegrating tablet [Pharmacy Med Name: ONDANSETRON ODT 4 MG TABLET] 20 tablet 3    Sig: TAKE 1 TABLET BY MOUTH EVERY 8 HOURS AS NEEDED FOR NAUSEA AND VOMITING      Not Delegated - Gastroenterology: Antiemetics Failed - 03/23/2020  9:56 AM      Failed - This refill cannot be delegated      Passed - Valid encounter within last 6 months    Recent Outpatient Visits           3 weeks ago Type 2 diabetes mellitus without complication, without long-term current use of insulin (HCC)   [facility] Maple Hudson., MD   2 months ago Anxiety   [facility] Maple Hudson., MD   3 months ago Encounter for annual physical examination excluding gynecological examination in a patient older than 17 years   [facility] Lone, Leonette Monarch., MD   3 months ago Dark stools   [facility] [place], [place], [place]   4 months ago Nausea   [facility] [place], Marzella Schlein, MD       Future Appointments             In 2 months Maple Hudson., MD [facility], [facility]                Requested Prescriptions  Pending Prescriptions Disp Refills   ondansetron (ZOFRAN-ODT) 4 MG disintegrating tablet [Pharmacy Med Name: ONDANSETRON ODT 4 MG TABLET] 20 tablet 3    Sig: TAKE 1 TABLET BY MOUTH EVERY 8 HOURS AS NEEDED FOR NAUSEA AND VOMITING      Not Delegated - Gastroenterology: Antiemetics Failed - 03/23/2020  9:56 AM      Failed - This refill cannot be delegated      Passed - Valid encounter within last 6 months    Recent Outpatient Visits           3 weeks ago Type 2  diabetes mellitus without complication, without long-term current use of insulin [facility])   [facility] Maple Hudson., MD   2 months ago Anxiety   [facility] Maple Hudson., MD   3 months ago Encounter for annual physical examination excluding gynecological examination in a patient older than 17 years   [facility] Sullivan Lone, Leonette Monarch., MD   3 months ago Dark stools   [facility] [place], [place], [place]   4 months ago Nausea   [facility] [place], Marzella Schlein, MD       Future Appointments             In 2 months Maple Hudson., MD [facility], [facility]

## 2020-03-31 ENCOUNTER — Ambulatory Visit (INDEPENDENT_AMBULATORY_CARE_PROVIDER_SITE_OTHER): Payer: Medicare Other | Admitting: Physician Assistant

## 2020-03-31 DIAGNOSIS — R110 Nausea: Secondary | ICD-10-CM

## 2020-03-31 NOTE — Patient Instructions (Signed)
Nausea, Adult Nausea is feeling sick to your stomach or feeling that you are about to throw up (vomit). Feeling sick to your stomach is usually not serious, but it may be an early sign of a more serious medical problem. As you feel sicker to your stomach, you may throw up. If you throw up, or if you are not able to drink enough fluids, there is a risk that you may lose too much water in your body (get dehydrated). If you lose too much water in your body, you may:  Feel tired.  Feel thirsty.  Have a dry mouth.  Have cracked lips.  Go pee (urinate) less often. Older adults and people who have other diseases or a weak body defense system (immune system) have a higher risk of losing too much water in the body. The main goals of treating this condition are:  To relieve your nausea.  To ensure your nausea occurs less often.  To prevent throwing up and losing too much fluid. Follow these instructions at home: Watch your symptoms for any changes. Tell your doctor about them. Follow these instructions as told by your doctor. Eating and drinking      Take an ORS (oral rehydration solution). This is a drink that is sold at pharmacies and stores.  Drink clear fluids in small amounts as you are able. These include: ? Water. ? Ice chips. ? Fruit juice that has water added (diluted fruit juice). ? Low-calorie sports drinks.  Eat bland, easy-to-digest foods in small amounts as you are able, such as: ? Bananas. ? Applesauce. ? Rice. ? Low-fat (lean) meats. ? Toast. ? Crackers.  Avoid drinking fluids that have a lot of sugar or caffeine in them. This includes energy drinks, sports drinks, and soda.  Avoid alcohol.  Avoid spicy or fatty foods. General instructions  Take over-the-counter and prescription medicines only as told by your doctor.  Rest at home while you get better.  Drink enough fluid to keep your pee (urine) pale yellow.  Take slow and deep breaths when you feel  sick to your stomach.  Avoid food or things that have strong smells.  Wash your hands often with soap and water. If you cannot use soap and water, use hand sanitizer.  Make sure that all people in your home wash their hands well and often.  Keep all follow-up visits as told by your doctor. This is important. Contact a doctor if:  You feel sicker to your stomach.  You feel sick to your stomach for more than 2 days.  You throw up.  You are not able to drink fluids without throwing up.  You have new symptoms.  You have a fever.  You have a headache.  You have muscle cramps.  You have a rash.  You have pain while peeing.  You feel light-headed or dizzy. Get help right away if:  You have pain in your chest, neck, arm, or jaw.  You feel very weak or you pass out (faint).  You have throw up that is bright red or looks like coffee grounds.  You have bloody or black poop (stools) or poop that looks like tar.  You have a very bad headache, a stiff neck, or both.  You have very bad pain, cramping, or bloating in your belly (abdomen).  You have trouble breathing or you are breathing very quickly.  Your heart is beating very quickly.  Your skin feels cold and clammy.  You feel confused.    You have signs of losing too much water in your body, such as: ? Dark pee, very little pee, or no pee. ? Cracked lips. ? Dry mouth. ? Sunken eyes. ? Sleepiness. ? Weakness. These symptoms may be an emergency. Do not wait to see if the symptoms will go away. Get medical help right away. Call your local emergency services (911 in the [place] Do not drive yourself to the hospital. Summary  Nausea is feeling sick to your stomach or feeling that you are about to throw up (vomit).  If you throw up, or if you are not able to drink enough fluids, there is a risk that you may lose too much water in your body (get dehydrated).  Eat and drink what your doctor tells you. Take  over-the-counter and prescription medicines only as told by your doctor.  Contact a doctor right away if your symptoms get worse or you have new symptoms.  Keep all follow-up visits as told by your doctor. This is important. This information is not intended to replace advice given to you by your health care provider. Make sure you discuss any questions you have with your health care provider. Document Revised: 04/01/2018 Document Reviewed: 04/01/2018 Elsevier Patient Education  2020 [employer]  

## 2020-03-31 NOTE — Progress Notes (Signed)
Virtual telephone visit    Virtual Visit via Telephone Note   This visit type was conducted due to national recommendations for restrictions regarding the COVID-19 Pandemic (e.g. social distancing) in an effort to limit this patient's exposure and mitigate transmission in our community. Due to her co-morbid illnesses, this patient is at least at moderate risk for complications without adequate follow up. This format is felt to be most appropriate for this patient at this time. The patient did not have access to video technology or had technical difficulties with video requiring transitioning to audio format only (telephone). Physical exam was limited to content and character of the telephone converstion.    Patient location: Home Provider location: Office    Visit Date: 03/31/2020  Today's healthcare provider: Trinna Post, PA-C   Chief Complaint  Patient presents with  . Nausea  I,Adriana M Pollak,acting as a scribe for Trinna Post, PA-C.,have documented all relevant documentation on the behalf of Trinna Post, PA-C,as directed by  Trinna Post, PA-C while in the presence of Trinna Post, PA-C.  Subjective    HPI  Nausea Patient has a history of HTN, CHF, chronic bronchitis and reports having nausea for about 3 days now. Patient states she has not taking anything for the nausea. She denies any diarrhea or vomiting. She also has a history of vertigo and so took meclizine which was previously prescribed for this issue. She is not having and fevers, chills, chest pain, SOB. She reports she is feeling better after taking this.       Medications: Outpatient Medications Prior to Visit  Medication Sig  . aspirin EC 81 MG tablet Take 81 mg by mouth daily.  . blood glucose meter kit and supplies KIT Dispense based on patient and insurance preference. Use up to four times daily as directed. (FOR ICD-9 250.00, 250.01).  . Blood Glucose Monitoring Suppl (ONE TOUCH  ULTRA 2) w/Device KIT 1 each by Does not apply route daily.  . enalapril (VASOTEC) 20 MG tablet TAKE 1 TABLET BY MOUTH EVERY DAY  . fluticasone (FLONASE) 50 MCG/ACT nasal spray Place 1 spray into both nostrils daily as needed.   . furosemide (LASIX) 20 MG tablet TAKE 1 TABLET BY MOUTH EVERY DAY  . gabapentin (NEURONTIN) 300 MG capsule TAKE 2 CAPSULES IN THE MORNING, 1 CAPSULE IN THE AFTERNOON AND 2 CAPSULES IN THE EVENING  . ipratropium-albuterol (DUONEB) 0.5-2.5 (3) MG/3ML SOLN TAKE 3 MLS BY NEBULIZATION EVERY 6 (SIX) HOURS AS NEEDED.  Marland Kitchen isosorbide mononitrate (IMDUR) 30 MG 24 hr tablet Take 30 mg by mouth daily as needed (high bp).  Marland Kitchen levothyroxine (SYNTHROID) 125 MCG tablet TAKE 1 TABLET BY MOUTH DAILY  . lisinopril (ZESTRIL) 20 MG tablet Take by mouth.  . loratadine (CLARITIN) 10 MG tablet Take 1 tablet (10 mg total) by mouth daily.  Marland Kitchen LORazepam (ATIVAN) 0.5 MG tablet Take 1 tablet (0.5 mg total) by mouth 2 (two) times daily as needed for anxiety.  . meclizine (ANTIVERT) 12.5 MG tablet Take 1 tablet (12.5 mg total) by mouth 3 (three) times daily as needed for dizziness.  . metFORMIN (GLUCOPHAGE) 1000 MG tablet TAKE 1 TABLET BY MOUTH TWICE A DAY (Patient taking differently: Take 1,000 mg by mouth daily with breakfast. )  . mometasone-formoterol (DULERA) 100-5 MCG/ACT AERO Inhale 2 puffs into the lungs 2 (two) times daily.  . MULTIPLE VITAMIN PO Take 1 tablet by mouth daily.   Marland Kitchen omeprazole (PRILOSEC) 40 MG  capsule Take 1 capsule (40 mg total) by mouth daily.  . ondansetron (ZOFRAN-ODT) 4 MG disintegrating tablet TAKE 1 TABLET BY MOUTH EVERY 8 HOURS AS NEEDED FOR NAUSEA AND VOMITING  . ONETOUCH ULTRA test strip USE 1 STRIP TO TEST GLUCOSE TWICE DAILY AS NEEDED  . OXYGEN Inhale 4 L into the lungs.  Marland Kitchen PARoxetine (PAXIL) 10 MG tablet Take 1 tablet (10 mg total) by mouth at bedtime.  . pravastatin (PRAVACHOL) 10 MG tablet TAKE 1 TABLET (10 MG TOTAL) BY MOUTH DAILY.  . roflumilast (DALIRESP) 500 MCG  TABS tablet Take 500 mcg by mouth daily. As needed  . sertraline (ZOLOFT) 50 MG tablet Take 1 tablet (50 mg total) by mouth daily.  Marland Kitchen tiotropium (SPIRIVA) 18 MCG inhalation capsule Place into inhaler and inhale.  . albuterol (PROVENTIL HFA;VENTOLIN HFA) 108 (90 Base) MCG/ACT inhaler Inhale 2 puff every 4-6 hrs as needed for SOB (Patient not taking: Reported on 12/15/2019)  . arformoterol (BROVANA) 15 MCG/2ML NEBU Take 2 mLs (15 mcg total) by nebulization 2 (two) times daily. (Patient not taking: Reported on 12/15/2019)  . azithromycin (ZITHROMAX Z-PAK) 250 MG tablet 2 tabs PO on day 1, then 1 tab PO daily for 4 days (Patient not taking: Reported on 11/23/2019)  . benzonatate (TESSALON) 200 MG capsule Take 1 capsule (200 mg total) by mouth 3 (three) times daily as needed. (Patient not taking: Reported on 11/16/2019)  . HYDROcodone-acetaminophen (NORCO) 10-325 MG tablet Take by mouth.  . oxyCODONE-acetaminophen (PERCOCET/ROXICET) 5-325 MG tablet Take 1 tablet by mouth every 6 (six) hours as needed for severe pain. (Patient not taking: Reported on 10/23/2019)  . predniSONE (STERAPRED UNI-PAK 21 TAB) 10 MG (21) TBPK tablet Taper as directed. (Patient not taking: Reported on 12/10/2019)   No facility-administered medications prior to visit.    Review of Systems  Constitutional: Negative.   Respiratory: Negative.   Gastrointestinal: Positive for nausea. Negative for abdominal pain, constipation, diarrhea and vomiting.  Neurological: Negative.       Objective    There were no vitals taken for this visit.     Assessment & Plan    1. Nausea  Patient is feeling better. She may continue meclizine PRN. Follow up if worsening.    Return if symptoms worsen or fail to improve.    I discussed the assessment and treatment plan with the patient. The patient was provided an opportunity to ask questions and all were answered. The patient agreed with the plan and demonstrated an understanding of the  instructions.   The patient was advised to call back or seek an in-person evaluation if the symptoms worsen or if the condition fails to improve as anticipated.  I provided 21 minutes of non-face-to-face time during this encounter.  ITrinna Post, PA-C, have reviewed all documentation for this visit. The documentation on 03/31/20 for the exam, diagnosis, procedures, and orders are all accurate and complete.   Paulene Floor [facility] [phone] (phone) [phone] (fax)  [place]

## 2020-04-01 DIAGNOSIS — J449 Chronic obstructive pulmonary disease, unspecified: Secondary | ICD-10-CM | POA: Diagnosis not present

## 2020-04-06 NOTE — Progress Notes (Signed)
Trena Platt Cummings,acting as a scribe for Wilhemena Durie, MD.,have documented all relevant documentation on the behalf of Wilhemena Durie, MD,as directed by  Wilhemena Durie, MD while in the presence of Wilhemena Durie, MD.  Established patient visit   Patient: Kristen Schroeder   DOB: 11/15/1940   79 y.o. Female  MRN: [id number] Visit Date: 04/07/2020  Today's healthcare provider: Wilhemena Durie, MD   Chief Complaint  Patient presents with  . Abdominal Pain  . Shortness of Breath   Subjective    Abdominal Pain This is a new problem. The current episode started in the past 7 days. The problem occurs every several days. The problem has been waxing and waning. Associated symptoms include flatus and nausea. Pertinent negatives include no belching, constipation, diarrhea, frequency, headaches or vomiting. Nothing aggravates the pain. Relieved by: medication. Treatments tried: Zofran. The treatment provided mild relief. Her past medical history is significant for abdominal surgery and GERD.  Patient complains of recent onset of nausea in the last week.  No real abdominal pain like when she had some bowel obstruction in the last couple of years. Patient also complains of shortness of breath, patient says that lately in the past week or so her sob has gotten worse.  She is on 24-hour oxygen at home.  She feels safe in her home.  She does have some mild increase swelling in her legs which she has not had before.      Medications: Outpatient Medications Prior to Visit  Medication Sig  . albuterol (PROVENTIL HFA;VENTOLIN HFA) 108 (90 Base) MCG/ACT inhaler Inhale 2 puff every 4-6 hrs as needed for SOB (Patient not taking: Reported on 12/15/2019)  . arformoterol (BROVANA) 15 MCG/2ML NEBU Take 2 mLs (15 mcg total) by nebulization 2 (two) times daily. (Patient not taking: Reported on 12/15/2019)  . aspirin EC 81 MG tablet Take 81 mg by mouth daily.  Marland Kitchen azithromycin (ZITHROMAX Z-PAK) 250  MG tablet 2 tabs PO on day 1, then 1 tab PO daily for 4 days (Patient not taking: Reported on 11/23/2019)  . benzonatate (TESSALON) 200 MG capsule Take 1 capsule (200 mg total) by mouth 3 (three) times daily as needed. (Patient not taking: Reported on 11/16/2019)  . blood glucose meter kit and supplies KIT Dispense based on patient and insurance preference. Use up to four times daily as directed. (FOR ICD-9 250.00, 250.01).  . Blood Glucose Monitoring Suppl (ONE TOUCH ULTRA 2) w/Device KIT 1 each by Does not apply route daily.  . enalapril (VASOTEC) 20 MG tablet TAKE 1 TABLET BY MOUTH EVERY DAY  . fluticasone (FLONASE) 50 MCG/ACT nasal spray Place 1 spray into both nostrils daily as needed.   . furosemide (LASIX) 20 MG tablet TAKE 1 TABLET BY MOUTH EVERY DAY  . gabapentin (NEURONTIN) 300 MG capsule TAKE 2 CAPSULES IN THE MORNING, 1 CAPSULE IN THE AFTERNOON AND 2 CAPSULES IN THE EVENING  . HYDROcodone-acetaminophen (NORCO) 10-325 MG tablet Take by mouth.  Marland Kitchen ipratropium-albuterol (DUONEB) 0.5-2.5 (3) MG/3ML SOLN TAKE 3 MLS BY NEBULIZATION EVERY 6 (SIX) HOURS AS NEEDED.  Marland Kitchen isosorbide mononitrate (IMDUR) 30 MG 24 hr tablet Take 30 mg by mouth daily as needed (high bp).  Marland Kitchen levothyroxine (SYNTHROID) 125 MCG tablet TAKE 1 TABLET BY MOUTH DAILY  . lisinopril (ZESTRIL) 20 MG tablet Take by mouth.  . loratadine (CLARITIN) 10 MG tablet Take 1 tablet (10 mg total) by mouth daily.  Marland Kitchen LORazepam (ATIVAN) 0.5 MG  tablet Take 1 tablet (0.5 mg total) by mouth 2 (two) times daily as needed for anxiety.  . meclizine (ANTIVERT) 12.5 MG tablet Take 1 tablet (12.5 mg total) by mouth 3 (three) times daily as needed for dizziness.  . metFORMIN (GLUCOPHAGE) 1000 MG tablet TAKE 1 TABLET BY MOUTH TWICE A DAY (Patient taking differently: Take 1,000 mg by mouth daily with breakfast. )  . mometasone-formoterol (DULERA) 100-5 MCG/ACT AERO Inhale 2 puffs into the lungs 2 (two) times daily.  . MULTIPLE VITAMIN PO Take 1 tablet by  mouth daily.   Marland Kitchen omeprazole (PRILOSEC) 40 MG capsule Take 1 capsule (40 mg total) by mouth daily.  . ondansetron (ZOFRAN-ODT) 4 MG disintegrating tablet TAKE 1 TABLET BY MOUTH EVERY 8 HOURS AS NEEDED FOR NAUSEA AND VOMITING  . ONETOUCH ULTRA test strip USE 1 STRIP TO TEST GLUCOSE TWICE DAILY AS NEEDED  . oxyCODONE-acetaminophen (PERCOCET/ROXICET) 5-325 MG tablet Take 1 tablet by mouth every 6 (six) hours as needed for severe pain. (Patient not taking: Reported on 10/23/2019)  . OXYGEN Inhale 4 L into the lungs.  Marland Kitchen PARoxetine (PAXIL) 10 MG tablet Take 1 tablet (10 mg total) by mouth at bedtime.  . pravastatin (PRAVACHOL) 10 MG tablet TAKE 1 TABLET (10 MG TOTAL) BY MOUTH DAILY.  Marland Kitchen predniSONE (STERAPRED UNI-PAK 21 TAB) 10 MG (21) TBPK tablet Taper as directed. (Patient not taking: Reported on 12/10/2019)  . roflumilast (DALIRESP) 500 MCG TABS tablet Take 500 mcg by mouth daily. As needed  . sertraline (ZOLOFT) 50 MG tablet Take 1 tablet (50 mg total) by mouth daily.  Marland Kitchen tiotropium (SPIRIVA) 18 MCG inhalation capsule Place into inhaler and inhale.   No facility-administered medications prior to visit.    Review of Systems  Constitutional: Positive for appetite change. Negative for chills and fatigue.  HENT: Negative.   Eyes: Negative.   Respiratory: Negative for chest tightness.   Cardiovascular: Negative for palpitations.  Gastrointestinal: Positive for abdominal pain, flatus and nausea. Negative for constipation, diarrhea and vomiting.  Endocrine: Negative.   Genitourinary: Negative for frequency.  Allergic/Immunologic: Negative.   Neurological: Negative for dizziness, weakness and headaches.  Psychiatric/Behavioral: Negative.        Objective    BP (!) 151/68 (BP Location: Right Arm, Patient Position: Sitting, Cuff Size: Large)   Pulse 96   Temp (!) 97.3 F (36.3 C) (Temporal)   Ht '5\' 7"'  (1.702 m)   Wt 221 lb (100.2 kg)   SpO2 96%   BMI 34.61 kg/m  BP Readings from Last 3  Encounters:  04/07/20 (!) 151/68  02/29/20 131/73  12/15/19 134/81   Wt Readings from Last 3 Encounters:  04/07/20 221 lb (100.2 kg)  02/29/20 219 lb (99.3 kg)  12/15/19 218 lb (98.9 kg)      Physical Exam Vitals and nursing note reviewed.  Constitutional:      Appearance: Normal appearance. She is normal weight.  HENT:     Right Ear: Tympanic membrane normal.     Left Ear: Tympanic membrane normal.     Nose: Nose normal.     Mouth/Throat:     Mouth: Mucous membranes are moist.     Pharynx: Oropharynx is clear.  Eyes:     General: No scleral icterus.    Conjunctiva/sclera: Conjunctivae normal.  Cardiovascular:     Rate and Rhythm: Normal rate and regular rhythm.     Pulses: Normal pulses.     Heart sounds: Normal heart sounds.  Pulmonary:  Effort: Pulmonary effort is normal.     Breath sounds: Normal breath sounds.  Abdominal:     Palpations: Abdomen is soft.     Tenderness: There is no abdominal tenderness.  Musculoskeletal:     Cervical back: Normal range of motion and neck supple.  Skin:    General: Skin is warm and dry.  Neurological:     Mental Status: She is alert and oriented to person, place, and time.     Gait: Gait abnormal.  Psychiatric:        Mood and Affect: Mood normal.        Behavior: Behavior normal.        Thought Content: Thought content normal.        Judgment: Judgment normal.       No results found for any visits on 04/07/20.  Assessment & Plan     1. Pain of upper abdomen Start omeprazole 20 mg every morning.  Follow-up 2 to 3 weeks. - DG Abd 1 View - CBC w/Diff/Platelet - Comprehensive Metabolic Panel (CMET) - Lipid panel  2. Nausea See above. - DG Abd 1 View - CBC w/Diff/Platelet - Comprehensive Metabolic Panel (CMET) - Lipid panel  3. Other hyperlipidemia  - CBC w/Diff/Platelet - Comprehensive Metabolic Panel (CMET) - Lipid panel  4. Congestive heart failure, unspecified HF chronicity, unspecified heart failure  type (HCC) Try Lasix 20 mg every morning.  Follow-up renal panel in 2 to 3 weeks - CBC w/Diff/Platelet - Comprehensive Metabolic Panel (CMET) - Lipid panel  5. Chronic obstructive pulmonary disease, unspecified COPD type (HCC) On 24 hours O2 - CBC w/Diff/Platelet - Comprehensive Metabolic Panel (CMET) - Lipid panel  6. Lower extremity edema Patient has never had edema before.  Recheck next visit. - furosemide (LASIX) 20 MG tablet; Take 2 tablets (40 mg total) by mouth daily.  Dispense: 180 tablet; Refill: 1  7. Gastroesophageal reflux disease  - omeprazole (PRILOSEC) 40 MG capsule; Take 1 capsule (40 mg total) by mouth daily.  Dispense: 90 capsule; Refill: 1   No follow-ups on file.      I, Wilhemena Durie, MD, have reviewed all documentation for this visit. The documentation on 04/10/20 for the exam, diagnosis, procedures, and orders are all accurate and complete.    Josslin Sanjuan Cranford Mon, MD  [facility] [phone] (phone) [phone] (fax)  [place]

## 2020-04-07 ENCOUNTER — Other Ambulatory Visit: Payer: Self-pay

## 2020-04-07 ENCOUNTER — Ambulatory Visit
Admission: RE | Admit: 2020-04-07 | Discharge: 2020-04-07 | Disposition: A | Discharge status: Home / Self Care | Payer: Medicare Other | Source: Ambulatory Visit | Attending: Family Medicine | Admitting: Family Medicine

## 2020-04-07 ENCOUNTER — Ambulatory Visit (INDEPENDENT_AMBULATORY_CARE_PROVIDER_SITE_OTHER): Payer: Medicare Other | Admitting: Family Medicine

## 2020-04-07 ENCOUNTER — Encounter: Payer: Self-pay | Admitting: Family Medicine

## 2020-04-07 VITALS — BP 151/68 | HR 96 | Temp 97.3°F | Ht 67.0 in | Wt 221.0 lb

## 2020-04-07 DIAGNOSIS — J449 Chronic obstructive pulmonary disease, unspecified: Secondary | ICD-10-CM | POA: Diagnosis not present

## 2020-04-07 DIAGNOSIS — R109 Unspecified abdominal pain: Secondary | ICD-10-CM | POA: Diagnosis not present

## 2020-04-07 DIAGNOSIS — R1010 Upper abdominal pain, unspecified: Secondary | ICD-10-CM

## 2020-04-07 DIAGNOSIS — I509 Heart failure, unspecified: Secondary | ICD-10-CM | POA: Diagnosis not present

## 2020-04-07 DIAGNOSIS — R110 Nausea: Secondary | ICD-10-CM | POA: Diagnosis not present

## 2020-04-07 DIAGNOSIS — E7849 Other hyperlipidemia: Secondary | ICD-10-CM | POA: Diagnosis not present

## 2020-04-07 DIAGNOSIS — K219 Gastro-esophageal reflux disease without esophagitis: Secondary | ICD-10-CM

## 2020-04-07 DIAGNOSIS — R600 Localized edema: Secondary | ICD-10-CM

## 2020-04-07 MED ORDER — OMEPRAZOLE 40 MG PO CPDR: 40.0000 mg | DELAYED_RELEASE_CAPSULE | Freq: Every day | ORAL | 1 refills | Status: DC

## 2020-04-07 MED ORDER — FUROSEMIDE 20 MG PO TABS: 40.0000 mg | ORAL_TABLET | Freq: Every day | ORAL | 1 refills | Status: DC

## 2020-04-08 LAB — COMPREHENSIVE METABOLIC PANEL
ALT: 3 IU/L (ref 0–32)
AST: 15 IU/L (ref 0–40)
Albumin/Globulin Ratio: 1.5 (ref 1.2–2.2)
Albumin: 3.7 g/dL (ref 3.7–4.7)
Alkaline Phosphatase: 72 IU/L (ref 48–121)
BUN/Creatinine Ratio: 14 (ref 12–28)
BUN: 7 mg/dL — ABNORMAL LOW (ref 8–27)
Bilirubin Total: <0.2 mg/dL (ref 0.0–1.2)
CO2: 41 mmol/L (ref 20–29)
Calcium: 9 mg/dL (ref 8.7–10.3)
Chloride: 94 mmol/L — ABNORMAL LOW (ref 96–106)
Creatinine, Ser: 0.51 mg/dL — ABNORMAL LOW (ref 0.57–1.00)
GFR calc Af Amer: 107 mL/min/{1.73_m2} (ref >59)
GFR calc non Af Amer: 92 mL/min/{1.73_m2} (ref >59)
Globulin, Total: 2.4 g/dL (ref 1.5–4.5)
Glucose: 107 mg/dL — ABNORMAL HIGH (ref 65–99)
Potassium: 4.1 mmol/L (ref 3.5–5.2)
Sodium: 147 mmol/L — ABNORMAL HIGH (ref 134–144)
Total Protein: 6.1 g/dL (ref 6.0–8.5)

## 2020-04-08 LAB — CBC WITH DIFFERENTIAL/PLATELET
Basophils Absolute: 0 10*3/uL (ref 0.0–0.2)
Basos: 0 %
EOS (ABSOLUTE): 0.2 10*3/uL (ref 0.0–0.4)
Eos: 2 %
Hematocrit: 27.4 % — ABNORMAL LOW (ref 34.0–46.6)
Hemoglobin: 8.2 g/dL — ABNORMAL LOW (ref 11.1–15.9)
Immature Grans (Abs): 0.1 10*3/uL (ref 0.0–0.1)
Immature Granulocytes: 1 %
Lymphocytes Absolute: 1 10*3/uL (ref 0.7–3.1)
Lymphs: 13 %
MCH: 27.8 pg (ref 26.6–33.0)
MCHC: 29.9 g/dL — ABNORMAL LOW (ref 31.5–35.7)
MCV: 93 fL (ref 79–97)
Monocytes Absolute: 0.7 10*3/uL (ref 0.1–0.9)
Monocytes: 9 %
Neutrophils Absolute: 5.7 10*3/uL (ref 1.4–7.0)
Neutrophils: 75 %
Platelets: 169 10*3/uL (ref 150–450)
RBC: 2.95 x10E6/uL — ABNORMAL LOW (ref 3.77–5.28)
RDW: 12.1 % (ref 11.7–15.4)
WBC: 7.6 10*3/uL (ref 3.4–10.8)

## 2020-04-11 ENCOUNTER — Telehealth: Payer: Self-pay

## 2020-04-11 NOTE — Telephone Encounter (Signed)
Patient advised of lab and xray results.

## 2020-04-11 NOTE — Telephone Encounter (Signed)
-----   Message from Maple Hudson., MD sent at 04/08/2020 11:57 AM EDT ----- Labs stable.

## 2020-04-14 ENCOUNTER — Other Ambulatory Visit: Payer: Self-pay | Admitting: Adult Health

## 2020-04-14 DIAGNOSIS — J449 Chronic obstructive pulmonary disease, unspecified: Secondary | ICD-10-CM | POA: Diagnosis not present

## 2020-04-27 NOTE — Progress Notes (Signed)
Established patient visit  I,April Miller,acting as a scribe for Wilhemena Durie, MD.,have documented all relevant documentation on the behalf of Wilhemena Durie, MD,as directed by  Wilhemena Durie, MD while in the presence of Wilhemena Durie, MD.   Patient: Kristen Schroeder   DOB: [birth year]   78 y.o. Female  MRN: [id number] Visit Date: 04/28/2020  Today's healthcare provider: Wilhemena Durie, MD   Chief Complaint  Patient presents with  . Follow-up   Subjective    HPI  Pain in epigastrium noted.  Appetite okay.  No nausea or vomiting.  Some reflux.  She is taking omeprazole.  Again.  She is feeling better. Pain of upper abdomen From 04/07/2020-Started omeprazole 20 mg every morning. X-ray of abdomen obtained.  Patient states she is doing well on omeprazole. Patient states she is feeling better. She is no longer having abdominal pain.   Congestive heart failure, unspecified HF chronicity, unspecified heart failure type ([place]) From 04/07/2020-Try Lasix 20 mg every morning.  Follow-up renal panel in 2 to 3 weeks  Chronic obstructive pulmonary disease, unspecified COPD type ([place]) From 04/07/2020-On 24 hours O2 Her inhalers are very expensive. Lower extremity edema From 04/07/2020-Patient has never had edema before.  Recheck next visit. This is improved some from last visit.     Medications: Outpatient Medications Prior to Visit  Medication Sig  . aspirin EC 81 MG tablet Take 81 mg by mouth daily.  . blood glucose meter kit and supplies KIT Dispense based on patient and insurance preference. Use up to four times daily as directed. (FOR ICD-9 250.00, 250.01).  . Blood Glucose Monitoring Suppl (ONE TOUCH ULTRA 2) w/Device KIT 1 each by Does not apply route daily.  . enalapril (VASOTEC) 20 MG tablet TAKE 1 TABLET BY MOUTH EVERY DAY  . fluticasone (FLONASE) 50 MCG/ACT nasal spray Place 1 spray into both nostrils daily as needed.   . furosemide (LASIX) 20 MG tablet  Take 2 tablets (40 mg total) by mouth daily.  Marland Kitchen gabapentin (NEURONTIN) 300 MG capsule TAKE 2 CAPSULES IN THE MORNING, 1 CAPSULE IN THE AFTERNOON AND 2 CAPSULES IN THE EVENING  . HYDROcodone-acetaminophen (NORCO) 10-325 MG tablet Take by mouth.  Marland Kitchen ipratropium-albuterol (DUONEB) 0.5-2.5 (3) MG/3ML SOLN TAKE 3 MLS BY NEBULIZATION EVERY 6 (SIX) HOURS AS NEEDED.  Marland Kitchen isosorbide mononitrate (IMDUR) 30 MG 24 hr tablet Take 30 mg by mouth daily as needed (high bp).  Marland Kitchen levothyroxine (SYNTHROID) 125 MCG tablet TAKE 1 TABLET BY MOUTH DAILY  . lisinopril (ZESTRIL) 20 MG tablet Take by mouth.  . loratadine (CLARITIN) 10 MG tablet Take 1 tablet (10 mg total) by mouth daily.  Marland Kitchen LORazepam (ATIVAN) 0.5 MG tablet Take 1 tablet (0.5 mg total) by mouth 2 (two) times daily as needed for anxiety.  . meclizine (ANTIVERT) 12.5 MG tablet Take 1 tablet (12.5 mg total) by mouth 3 (three) times daily as needed for dizziness.  . metFORMIN (GLUCOPHAGE) 1000 MG tablet TAKE 1 TABLET BY MOUTH TWICE A DAY (Patient taking differently: Take 1,000 mg by mouth daily with breakfast. )  . mometasone-formoterol (DULERA) 100-5 MCG/ACT AERO Inhale 2 puffs into the lungs 2 (two) times daily.  . MULTIPLE VITAMIN PO Take 1 tablet by mouth daily.   Marland Kitchen omeprazole (PRILOSEC) 40 MG capsule Take 1 capsule (40 mg total) by mouth daily.  . ondansetron (ZOFRAN-ODT) 4 MG disintegrating tablet TAKE 1 TABLET BY MOUTH EVERY 8 HOURS AS NEEDED FOR NAUSEA AND  VOMITING  . ONETOUCH ULTRA test strip USE 1 STRIP TO TEST GLUCOSE TWICE DAILY AS NEEDED  . OXYGEN Inhale 4 L into the lungs.  Marland Kitchen PARoxetine (PAXIL) 10 MG tablet Take 1 tablet (10 mg total) by mouth at bedtime.  . pravastatin (PRAVACHOL) 10 MG tablet TAKE 1 TABLET (10 MG TOTAL) BY MOUTH DAILY.  Marland Kitchen predniSONE (STERAPRED UNI-PAK 21 TAB) 10 MG (21) TBPK tablet Taper as directed.  . roflumilast (DALIRESP) 500 MCG TABS tablet Take 500 mcg by mouth daily. As needed  . sertraline (ZOLOFT) 50 MG tablet Take 1  tablet (50 mg total) by mouth daily.  Marland Kitchen tiotropium (SPIRIVA) 18 MCG inhalation capsule Place into inhaler and inhale.  . albuterol (PROVENTIL HFA;VENTOLIN HFA) 108 (90 Base) MCG/ACT inhaler Inhale 2 puff every 4-6 hrs as needed for SOB (Patient not taking: Reported on 12/15/2019)  . arformoterol (BROVANA) 15 MCG/2ML NEBU Take 2 mLs (15 mcg total) by nebulization 2 (two) times daily. (Patient not taking: Reported on 12/15/2019)  . oxyCODONE-acetaminophen (PERCOCET/ROXICET) 5-325 MG tablet Take 1 tablet by mouth every 6 (six) hours as needed for severe pain. (Patient not taking: Reported on 10/23/2019)  . [DISCONTINUED] azithromycin (ZITHROMAX Z-PAK) 250 MG tablet 2 tabs PO on day 1, then 1 tab PO daily for 4 days (Patient not taking: Reported on 11/23/2019)  . [DISCONTINUED] benzonatate (TESSALON) 200 MG capsule Take 1 capsule (200 mg total) by mouth 3 (three) times daily as needed. (Patient not taking: Reported on 11/16/2019)   No facility-administered medications prior to visit.    Review of Systems  Constitutional: Negative for appetite change, chills, fatigue and fever.  HENT: Negative.   Eyes: Negative.   Respiratory: Negative for chest tightness and shortness of breath.   Cardiovascular: Negative for chest pain and palpitations.  Gastrointestinal: Negative for abdominal pain, nausea and vomiting.  Endocrine: Negative.   Musculoskeletal: Positive for back pain.  Neurological: Positive for weakness. Negative for dizziness.       Diffuse weakness.  Hematological: Negative.   Psychiatric/Behavioral: Negative.        Objective    BP 127/77 (BP Location: Right Arm, Patient Position: Sitting, Cuff Size: Large)   Pulse 90   Temp (!) 97.3 F (36.3 C) (Other (Comment))   Resp 18   Ht '5\' 7"'  (1.702 m)   Wt 225 lb (102.1 kg)   SpO2 99%   BMI 35.24 kg/m  BP Readings from Last 3 Encounters:  04/28/20 127/77  04/07/20 (!) 151/68  02/29/20 131/73   Wt Readings from Last 3 Encounters:   04/28/20 225 lb (102.1 kg)  04/07/20 221 lb (100.2 kg)  02/29/20 219 lb (99.3 kg)      Physical Exam Vitals and nursing note reviewed.  Constitutional:      Appearance: Normal appearance. She is normal weight.  HENT:     Right Ear: Tympanic membrane normal.     Left Ear: Tympanic membrane normal.     Nose: Nose normal.     Mouth/Throat:     Mouth: Mucous membranes are moist.     Pharynx: Oropharynx is clear.  Eyes:     General: No scleral icterus.    Conjunctiva/sclera: Conjunctivae normal.  Cardiovascular:     Rate and Rhythm: Normal rate and regular rhythm.     Pulses: Normal pulses.     Heart sounds: Normal heart sounds.  Pulmonary:     Effort: Pulmonary effort is normal.     Breath sounds: Normal breath sounds.  Abdominal:  Palpations: Abdomen is soft.     Tenderness: There is no abdominal tenderness.  Musculoskeletal:     Cervical back: Normal range of motion and neck supple.     Comments: Trace lower extremities ankle and pedal edema  Skin:    General: Skin is warm and dry.  Neurological:     Mental Status: She is alert and oriented to person, place, and time.  Psychiatric:        Mood and Affect: Mood normal.        Behavior: Behavior normal.        Thought Content: Thought content normal.        Judgment: Judgment normal.       No results found for any visits on 04/28/20.  Assessment & Plan     1. Essential (primary) hypertension  - Renal function panel  2. Chronic obstructive pulmonary disease, unspecified COPD type ([place]) In place of Dulera, Try Breztri 1 puff twice a day for 2 weeks. Given samples of Breztri - Renal function panel  3. Gastroesophageal reflux disease, unspecified whether esophagitis present  - Renal function panel 4. Pain of upper abdomen Better with omeprazole.  5. Reactive depression Clinically stable  6. Class 2 severe obesity due to excess calories with serious comorbidity and body mass index (BMI) of 35.0 to 35.9 in  adult [facility]) With hypertension and hyperlipidemia and arthritis  7. Pulmonary fibrosis ([place]) Followed by pulmonary.   Return in about 2 months (around 06/28/2020).      I, Wilhemena Durie, MD, have reviewed all documentation for this visit. The documentation on 05/02/20 for the exam, diagnosis, procedures, and orders are all accurate and complete.    Richard Cranford Mon, MD  [facility] [phone] (phone) [phone] (fax)  [place]

## 2020-04-28 ENCOUNTER — Other Ambulatory Visit: Payer: Self-pay

## 2020-04-28 ENCOUNTER — Encounter: Payer: Self-pay | Admitting: Family Medicine

## 2020-04-28 ENCOUNTER — Ambulatory Visit (INDEPENDENT_AMBULATORY_CARE_PROVIDER_SITE_OTHER): Payer: Medicare Other | Admitting: Family Medicine

## 2020-04-28 VITALS — BP 127/77 | HR 90 | Temp 97.3°F | Resp 18 | Ht 67.0 in | Wt 225.0 lb

## 2020-04-28 DIAGNOSIS — J449 Chronic obstructive pulmonary disease, unspecified: Secondary | ICD-10-CM

## 2020-04-28 DIAGNOSIS — K219 Gastro-esophageal reflux disease without esophagitis: Secondary | ICD-10-CM | POA: Diagnosis not present

## 2020-04-28 DIAGNOSIS — J8410 Pulmonary fibrosis, unspecified: Secondary | ICD-10-CM

## 2020-04-28 DIAGNOSIS — I10 Essential (primary) hypertension: Secondary | ICD-10-CM | POA: Diagnosis not present

## 2020-04-28 DIAGNOSIS — Z6835 Body mass index (BMI) 35.0-35.9, adult: Secondary | ICD-10-CM

## 2020-04-28 DIAGNOSIS — R1010 Upper abdominal pain, unspecified: Secondary | ICD-10-CM

## 2020-04-28 DIAGNOSIS — F329 Major depressive disorder, single episode, unspecified: Secondary | ICD-10-CM

## 2020-04-28 NOTE — Patient Instructions (Signed)
In place of Dulera, Try Breztri 1 puff twice a day for 2 weeks.

## 2020-04-29 ENCOUNTER — Telehealth: Payer: Self-pay

## 2020-04-29 LAB — RENAL FUNCTION PANEL
Albumin: 3.8 g/dL (ref 3.7–4.7)
BUN/Creatinine Ratio: 15 (ref 12–28)
BUN: 9 mg/dL (ref 8–27)
CO2: 41 mmol/L (ref 20–29)
Calcium: 8.8 mg/dL (ref 8.7–10.3)
Chloride: 96 mmol/L (ref 96–106)
Creatinine, Ser: 0.6 mg/dL (ref 0.57–1.00)
GFR calc Af Amer: 101 mL/min/{1.73_m2} (ref >59)
GFR calc non Af Amer: 88 mL/min/{1.73_m2} (ref >59)
Glucose: 90 mg/dL (ref 65–99)
Phosphorus: 3.2 mg/dL (ref 3.0–4.3)
Potassium: 4.3 mmol/L (ref 3.5–5.2)
Sodium: 145 mmol/L — ABNORMAL HIGH (ref 134–144)

## 2020-04-29 NOTE — Telephone Encounter (Signed)
Patient advised of lab results

## 2020-04-29 NOTE — Telephone Encounter (Signed)
-----   Message from Maple Hudson., MD sent at 04/29/2020 10:27 AM EDT ----- Labs stable.

## 2020-05-02 DIAGNOSIS — J449 Chronic obstructive pulmonary disease, unspecified: Secondary | ICD-10-CM | POA: Diagnosis not present

## 2020-05-14 DIAGNOSIS — J449 Chronic obstructive pulmonary disease, unspecified: Secondary | ICD-10-CM | POA: Diagnosis not present

## 2020-05-17 NOTE — Progress Notes (Addendum)
Established patient visit   Patient: Kristen Schroeder   DOB: 1941-03-16   79 y.o. Female  MRN: [id number] Visit Date: 05/18/2020  I,Damien Batty S Elias Bordner,acting as a scribe for Wilhemena Durie, MD.,have documented all relevant documentation on the behalf of Wilhemena Durie, MD,as directed by  Wilhemena Durie, MD while in the presence of Wilhemena Durie, MD.  Today's healthcare provider: Wilhemena Durie, MD   Chief Complaint  Patient presents with  . Back Pain   Subjective    HPI HPI    Back Pain    This is a chronic problem (Patient reports pain is worse with activity. She reports that she can't even walk with out stopping from one room to another at her house. ).  There was not an injury that may have caused the pain.  Onset: Several years ago.  The problem has been gradually worsening since onset.  Pain is lumbar spine.  The pain radiates to left thigh.  The quality of pain is described as stiffness.  Severity of the pain is severe.  Pain occurs intermittently.  Symptoms worse in mid-day.  The symptoms are aggravated by walking and standing.  Symptoms are relieved by lying down and sitting.  Treatments: acetaminophen.  Treatment provided mild relief.  Abdominal Pain: Absent.  Bowel incontinence: Absent.  Chest pain:  Absent.  Dysuria: Absent.  Fever: Absent.  Headaches: Absent.  Joint pains: Present.  Weakness in leg: Present.  Pelvic pain: Absent.  Tingling in lower extremities: Present.  Urinary incontinence: Absent.  Weight loss: Absent.       Last edited by Dorian Pod, CMA on 05/18/2020  4:12 PM. (History)      She comes in today for a face-to-face to try to get a scooter for home.  She states she has plenty of room in her home to use a scooter.  She is able to use her arms and hands.  She is able to walk in the home to the bathroom but has trouble otherwise and would like to be able to get around the home and go to the kitchen to do things.  She has pulmonary  fibrosis and is on oxygen 24 hours a day.  He does have chronic back pain after having LS-spine surgery back in the late 80s or early 90s. She is unable to use a cane or a walker or a manual wheelchair due to to her severe dyspnea and pulmonary fibrosis.  She also does not have the strength to operate a manual wheelchair and there is no at the house to really help her.  Again, chronic oxygen use and chronic respiratory failure are the reason she needs to cover them. She can use her upper extremities and can bear weight on her lower extremities, she does have severe shortness of breath with any exertion due to her pulmonary disease.  For this reason a cane or walker would not be adequate. She has very minimal edema historically and has no pressure sores.  Neurologically she is intact. She is in the home but unable to get around the home due to her shortness of breath.  She is able to transfer and shift her weight. Patient is strong enough to use a manual wheelchair but would be unable to tolerate due to cardiopulmonary issues. Patient will be much better off with the [facility] and a scooter because her main use will be in the home and they are very tight with the  room in the home and the [facility] can manage the tight spaces better. The patient is able to transfer herself will take about a minute to do so.  She is only able to walk a few steps now due to her respiratory condition. Patient Active Problem List   Diagnosis Date Noted  . Incarcerated hernia of abdominal cavity 08/04/2018  . SBO (small bowel obstruction) ([place]) 04/22/2018  . Incarcerated hernia 04/14/2018  . Aneurysm ([place]) 07/10/2016  . Nonspecific abnormal finding 05/11/2015  . Allergic rhinitis 05/11/2015  . Anxiety 05/11/2015  . Bronchitis, chronic ([place]) 05/11/2015  . CCF (congestive cardiac failure) ([place]) 05/11/2015  . Clinical depression 05/11/2015  . 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 Essential (primary)  hypertension 05/11/2015  . Acid reflux 05/11/2015  . HLD (hyperlipidemia) 05/11/2015  . Adult hypothyroidism 05/11/2015  . Cervical dysplasia, mild 05/11/2015  . Neuropathy 05/11/2015  . Adiposity 05/11/2015  . Obstructive apnea 05/11/2015  . Arthritis, degenerative 05/11/2015   Past Surgical History:  Procedure Laterality Date  . ABDOMINAL HYSTERECTOMY    . BOWEL RESECTION N/A 04/14/2018   Procedure: SMALL BOWEL RESECTION;  Surgeon: Jules Husbands, MD;  Location: [facility] ORS;  Service: General;  Laterality: N/A;  . BREAST BIOPSY Right    benign  . CATARACT EXTRACTION W/PHACO Left 07/03/2016   Procedure: CATARACT EXTRACTION PHACO AND INTRAOCULAR LENS PLACEMENT (IOC);  Surgeon: Birder Robson, MD;  Location: [facility] ORS;  Service: Ophthalmology;  Laterality: Left;  Lot: [id number] H [place]: 00:40.1 AP%: 17.4 CDE:6.94  . HERNIA REPAIR    . LAPAROTOMY N/A 09/04/2018   Procedure: EXPLORATORY LAPAROTOMY;  Surgeon: Olean Ree, MD;  Location: [facility] ORS;  Service: General;  Laterality: N/A;  . TUBAL LIGATION    . VENTRAL HERNIA REPAIR N/A 04/14/2018   Procedure: HERNIA REPAIR VENTRAL ADULT;  Surgeon: Jules Husbands, MD;  Location: [facility] ORS;  Service: General;  Laterality: N/A;   Social History   Tobacco Use  . Smoking status: Former Smoker    Packs/day: 0.50    Years: 15.00    Pack years: 7.50  . Smokeless tobacco: Never Used  . Tobacco comment: 30-40 years ago  Vaping Use  . Vaping Use: Never used  Substance Use Topics  . Alcohol use: No    Alcohol/week: 0.0 standard drinks  . Drug use: No       Medications: Outpatient Medications Prior to Visit  Medication Sig  . arformoterol (BROVANA) 15 MCG/2ML NEBU Take 2 mLs (15 mcg total) by nebulization 2 (two) times daily.  Marland Kitchen aspirin EC 81 MG tablet Take 81 mg by mouth daily.  . blood glucose meter kit and supplies KIT Dispense based on patient and insurance preference. Use up to four times daily as directed. (FOR ICD-9 250.00, 250.01).  .  Blood Glucose Monitoring Suppl (ONE TOUCH ULTRA 2) w/Device KIT 1 each by Does not apply route daily.  . enalapril (VASOTEC) 20 MG tablet TAKE 1 TABLET BY MOUTH EVERY DAY  . fluticasone (FLONASE) 50 MCG/ACT nasal spray Place 1 spray into both nostrils daily as needed.   . furosemide (LASIX) 20 MG tablet Take 2 tablets (40 mg total) by mouth daily.  Marland Kitchen gabapentin (NEURONTIN) 300 MG capsule TAKE 2 CAPSULES IN THE MORNING, 1 CAPSULE IN THE AFTERNOON AND 2 CAPSULES IN THE EVENING  . ipratropium-albuterol (DUONEB) 0.5-2.5 (3) MG/3ML SOLN TAKE 3 MLS BY NEBULIZATION EVERY 6 (SIX) HOURS AS NEEDED.  Marland Kitchen isosorbide mononitrate (IMDUR) 30 MG 24 hr tablet Take 30 mg  by mouth daily as needed (high bp).  Marland Kitchen levothyroxine (SYNTHROID) 125 MCG tablet TAKE 1 TABLET BY MOUTH DAILY  . lisinopril (ZESTRIL) 20 MG tablet Take by mouth.  . loratadine (CLARITIN) 10 MG tablet Take 1 tablet (10 mg total) by mouth daily.  Marland Kitchen LORazepam (ATIVAN) 0.5 MG tablet Take 1 tablet (0.5 mg total) by mouth 2 (two) times daily as needed for anxiety.  . meclizine (ANTIVERT) 12.5 MG tablet Take 1 tablet (12.5 mg total) by mouth 3 (three) times daily as needed for dizziness.  . metFORMIN (GLUCOPHAGE) 1000 MG tablet TAKE 1 TABLET BY MOUTH TWICE A DAY (Patient taking differently: Take 1,000 mg by mouth daily with breakfast. )  . mometasone-formoterol (DULERA) 100-5 MCG/ACT AERO Inhale 2 puffs into the lungs 2 (two) times daily.  . MULTIPLE VITAMIN PO Take 1 tablet by mouth daily.   Marland Kitchen omeprazole (PRILOSEC) 40 MG capsule Take 1 capsule (40 mg total) by mouth daily.  . ondansetron (ZOFRAN-ODT) 4 MG disintegrating tablet TAKE 1 TABLET BY MOUTH EVERY 8 HOURS AS NEEDED FOR NAUSEA AND VOMITING  . ONETOUCH ULTRA test strip USE 1 STRIP TO TEST GLUCOSE TWICE DAILY AS NEEDED  . OXYGEN Inhale 4 L into the lungs.  Marland Kitchen PARoxetine (PAXIL) 10 MG tablet Take 1 tablet (10 mg total) by mouth at bedtime.  . roflumilast (DALIRESP) 500 MCG TABS tablet Take 500 mcg by  mouth daily. As needed  . sertraline (ZOLOFT) 50 MG tablet Take 1 tablet (50 mg total) by mouth daily.  Marland Kitchen tiotropium (SPIRIVA) 18 MCG inhalation capsule Place into inhaler and inhale.  . [DISCONTINUED] albuterol (PROVENTIL HFA;VENTOLIN HFA) 108 (90 Base) MCG/ACT inhaler Inhale 2 puff every 4-6 hrs as needed for SOB (Patient not taking: Reported on 12/15/2019)  . [DISCONTINUED] HYDROcodone-acetaminophen (NORCO) 10-325 MG tablet Take by mouth. (Patient not taking: Reported on 05/18/2020)  . [DISCONTINUED] oxyCODONE-acetaminophen (PERCOCET/ROXICET) 5-325 MG tablet Take 1 tablet by mouth every 6 (six) hours as needed for severe pain. (Patient not taking: Reported on 10/23/2019)  . [DISCONTINUED] pravastatin (PRAVACHOL) 10 MG tablet TAKE 1 TABLET (10 MG TOTAL) BY MOUTH DAILY. (Patient not taking: Reported on 05/18/2020)  . [DISCONTINUED] predniSONE (STERAPRED UNI-PAK 21 TAB) 10 MG (21) TBPK tablet Taper as directed.   No facility-administered medications prior to visit.    Review of Systems  Constitutional: Positive for activity change and fatigue. Negative for unexpected weight change.  Respiratory: Negative for cough, chest tightness and shortness of breath.   Cardiovascular: Negative for chest pain and palpitations.  Endocrine: Negative.   Allergic/Immunologic: Negative.   Neurological: Negative.   Hematological: Negative.   Psychiatric/Behavioral: Positive for decreased concentration. The patient is nervous/anxious.     Last metabolic panel Lab Results  Component Value Date   GLUCOSE 90 04/28/2020   NA 145 (H) 04/28/2020   K 4.3 04/28/2020   CL 96 04/28/2020   CO2 41 (HH) 04/28/2020   BUN 9 04/28/2020   CREATININE 0.60 04/28/2020   GFRNONAA 88 04/28/2020   GFRAA 101 04/28/2020   CALCIUM 8.8 04/28/2020   PHOS 3.2 04/28/2020   PROT 6.1 04/07/2020   ALBUMIN 3.8 04/28/2020   LABGLOB 2.4 04/07/2020   AGRATIO 1.5 04/07/2020   BILITOT <0.2 04/07/2020   ALKPHOS 72 04/07/2020   AST 15  04/07/2020   ALT 3 04/07/2020   ANIONGAP 8 06/14/2019      Objective Height 5 ft 4   BP 122/69 (BP Location: Right Arm, Patient Position: Sitting, Cuff Size: Normal)  Pulse 97   Temp (!) 97.1 F (36.2 C) (Temporal)   Resp 16   Wt 225 lb (102.1 kg)   SpO2 96% Comment: 4 L  BMI 35.24 kg/m  BP Readings from Last 3 Encounters:  05/18/20 122/69  04/28/20 127/77  04/07/20 (!) 151/68   Wt Readings from Last 3 Encounters:  05/18/20 225 lb (102.1 kg)  04/28/20 225 lb (102.1 kg)  04/07/20 221 lb (100.2 kg)      Physical Exam Vitals and nursing note reviewed.  Constitutional:      Appearance: Normal appearance. She is normal weight.  HENT:     Right Ear: Tympanic membrane normal.     Left Ear: Tympanic membrane normal.     Nose: Nose normal.     Mouth/Throat:     Mouth: Mucous membranes are moist.     Pharynx: Oropharynx is clear.  Eyes:     General: No scleral icterus.    Conjunctiva/sclera: Conjunctivae normal.  Cardiovascular:     Rate and Rhythm: Normal rate and regular rhythm.     Pulses: Normal pulses.     Heart sounds: Normal heart sounds.  Pulmonary:     Effort: Pulmonary effort is normal.     Breath sounds: Normal breath sounds.  Abdominal:     Palpations: Abdomen is soft.     Tenderness: There is no abdominal tenderness.  Musculoskeletal:     Cervical back: Normal range of motion and neck supple.     Right lower leg: No edema.     Left lower leg: No edema.     Comments: Trace lower extremities ankle and pedal edema  Skin:    General: Skin is warm and dry.  Neurological:     General: No focal deficit present.     Mental Status: She is alert and oriented to person, place, and time.     Motor: No weakness.     Comments: Strength in  upper and lower extremities is in normal range, 5 -/ 5 in upper and lower extremities.  Psychiatric:        Mood and Affect: Mood normal.        Behavior: Behavior normal.        Thought Content: Thought content normal.         Judgment: Judgment normal.       No results found for any visits on 05/18/20.  Assessment & Plan     1. Pulmonary fibrosis ([place]) I think pt is appropriate for hom scooter.  It will certainly help her quality of life in the home.  2. Depression, major, single episode, complete remission ([place]) In remission.  3. Chronic combined systolic and diastolic congestive heart failure (H[facility]) Clinically stable.  4. Class 1 obesity due to excess calories with serious comorbidity and body mass index (BMI) of 33.0 to 33.9 in adult   5. DDD (degenerative disc disease), lumbosacral   6. Primary osteoarthritis of right hip    No follow-ups on file.      I, Wilhemena Durie, MD, have reviewed all documentation for this visit. The documentation on 05/20/20 for the exam, diagnosis, procedures, and orders are all accurate and complete.    Richard Cranford Mon, MD  [facility] [phone] (phone) [phone] (fax)  [place]

## 2020-05-18 ENCOUNTER — Encounter: Payer: Self-pay | Admitting: Family Medicine

## 2020-05-18 ENCOUNTER — Other Ambulatory Visit: Payer: Self-pay

## 2020-05-18 ENCOUNTER — Ambulatory Visit (INDEPENDENT_AMBULATORY_CARE_PROVIDER_SITE_OTHER): Payer: Medicare Other | Admitting: Family Medicine

## 2020-05-18 VITALS — BP 122/69 | HR 97 | Temp 97.1°F | Resp 16 | Wt 225.0 lb

## 2020-05-18 DIAGNOSIS — J8410 Pulmonary fibrosis, unspecified: Secondary | ICD-10-CM | POA: Diagnosis not present

## 2020-05-18 DIAGNOSIS — I5042 Chronic combined systolic (congestive) and diastolic (congestive) heart failure: Secondary | ICD-10-CM

## 2020-05-18 DIAGNOSIS — E6609 Other obesity due to excess calories: Secondary | ICD-10-CM

## 2020-05-18 DIAGNOSIS — F325 Major depressive disorder, single episode, in full remission: Secondary | ICD-10-CM | POA: Diagnosis not present

## 2020-05-18 DIAGNOSIS — M1611 Unilateral primary osteoarthritis, right hip: Secondary | ICD-10-CM

## 2020-05-18 DIAGNOSIS — Z6833 Body mass index (BMI) 33.0-33.9, adult: Secondary | ICD-10-CM

## 2020-05-18 DIAGNOSIS — M5137 Other intervertebral disc degeneration, lumbosacral region: Secondary | ICD-10-CM | POA: Diagnosis not present

## 2020-05-20 DIAGNOSIS — F325 Major depressive disorder, single episode, in full remission: Secondary | ICD-10-CM | POA: Insufficient documentation

## 2020-05-23 ENCOUNTER — Telehealth: Payer: Self-pay

## 2020-05-23 NOTE — Telephone Encounter (Signed)
Copied from CRM [id number]. Topic: General - Inquiry >> May 23, 2020  3:52 PM Floria Raveling A wrote: Reason for CRM: [employer] called called and wanted to give a fyi that they faxed back the paper and what was missing.  They are missing some ins questions answered and the prescription [phone]  Fax [phone]

## 2020-05-25 NOTE — Telephone Encounter (Signed)
Form was faxed back.

## 2020-05-25 NOTE — Telephone Encounter (Signed)
Form has been received.

## 2020-05-27 ENCOUNTER — Telehealth: Payer: Self-pay

## 2020-05-27 NOTE — Telephone Encounter (Signed)
Copied from CRM [id number]. Topic: General - Other >> May 27, 2020 10:31 AM Dalphine Handing A wrote: Patient would like to speak with Dr .Sullivan Lone nurse about antibiotics. Please advise

## 2020-05-27 NOTE — Telephone Encounter (Signed)
Spoke to patient and she would like to know if pcp thought it was okay if she takes an antibiotic for sinus congestion. Please advise?

## 2020-05-27 NOTE — Telephone Encounter (Signed)
I would use Robitussin and Tylenol.

## 2020-05-27 NOTE — Telephone Encounter (Signed)
Patient advised.

## 2020-05-31 ENCOUNTER — Ambulatory Visit: Payer: Self-pay | Admitting: Family Medicine

## 2020-06-01 DIAGNOSIS — J449 Chronic obstructive pulmonary disease, unspecified: Secondary | ICD-10-CM | POA: Diagnosis not present

## 2020-06-02 ENCOUNTER — Telehealth: Payer: Self-pay | Admitting: Family Medicine

## 2020-06-02 DIAGNOSIS — R05 Cough: Secondary | ICD-10-CM

## 2020-06-02 DIAGNOSIS — R059 Cough, unspecified: Secondary | ICD-10-CM

## 2020-06-02 NOTE — Telephone Encounter (Signed)
The patient is requesting a called in prescription for a cough and cold. She was told to try tylenol and robitussin but it isn't working and she has gotten more congested and stopped up. She is requesting a call back.

## 2020-06-03 NOTE — Telephone Encounter (Signed)
Okay to try prednisone 20 mg daily for 5 days.  That should help with the congestion and cough

## 2020-06-04 ENCOUNTER — Other Ambulatory Visit: Payer: Self-pay

## 2020-06-04 ENCOUNTER — Emergency Department: Payer: Medicare Other

## 2020-06-04 ENCOUNTER — Emergency Department
Admission: EM | Admit: 2020-06-04 | Discharge: 2020-06-04 | Disposition: A | Discharge status: Home / Self Care | Payer: Medicare Other | Attending: Emergency Medicine | Admitting: Emergency Medicine

## 2020-06-04 DIAGNOSIS — I10 Essential (primary) hypertension: Secondary | ICD-10-CM | POA: Diagnosis not present

## 2020-06-04 DIAGNOSIS — R0602 Shortness of breath: Secondary | ICD-10-CM | POA: Diagnosis not present

## 2020-06-04 DIAGNOSIS — E119 Type 2 diabetes mellitus without complications: Secondary | ICD-10-CM | POA: Insufficient documentation

## 2020-06-04 DIAGNOSIS — R0902 Hypoxemia: Secondary | ICD-10-CM | POA: Diagnosis not present

## 2020-06-04 DIAGNOSIS — Z87891 Personal history of nicotine dependence: Secondary | ICD-10-CM | POA: Diagnosis not present

## 2020-06-04 DIAGNOSIS — J449 Chronic obstructive pulmonary disease, unspecified: Secondary | ICD-10-CM | POA: Insufficient documentation

## 2020-06-04 DIAGNOSIS — I499 Cardiac arrhythmia, unspecified: Secondary | ICD-10-CM | POA: Diagnosis not present

## 2020-06-04 DIAGNOSIS — Z7982 Long term (current) use of aspirin: Secondary | ICD-10-CM | POA: Insufficient documentation

## 2020-06-04 DIAGNOSIS — R069 Unspecified abnormalities of breathing: Secondary | ICD-10-CM | POA: Diagnosis not present

## 2020-06-04 DIAGNOSIS — E039 Hypothyroidism, unspecified: Secondary | ICD-10-CM | POA: Insufficient documentation

## 2020-06-04 DIAGNOSIS — Z79899 Other long term (current) drug therapy: Secondary | ICD-10-CM | POA: Insufficient documentation

## 2020-06-04 DIAGNOSIS — Z743 Need for continuous supervision: Secondary | ICD-10-CM | POA: Diagnosis not present

## 2020-06-04 LAB — COMPREHENSIVE METABOLIC PANEL
ALT: 12 U/L (ref 0–44)
AST: 39 U/L (ref 15–41)
Albumin: 3.9 g/dL (ref 3.5–5.0)
Alkaline Phosphatase: 73 U/L (ref 38–126)
Anion gap: 8 (ref 5–15)
BUN: 9 mg/dL (ref 8–23)
CO2: 41 mmol/L — ABNORMAL HIGH (ref 22–32)
Calcium: 8.6 mg/dL — ABNORMAL LOW (ref 8.9–10.3)
Chloride: 93 mmol/L — ABNORMAL LOW (ref 98–111)
Creatinine, Ser: 0.64 mg/dL (ref 0.44–1.00)
GFR calc Af Amer: >60 mL/min (ref >60)
GFR calc non Af Amer: >60 mL/min (ref >60)
Glucose, Bld: 142 mg/dL — ABNORMAL HIGH (ref 70–99)
Potassium: 4.5 mmol/L (ref 3.5–5.1)
Sodium: 142 mmol/L (ref 135–145)
Total Bilirubin: 0.8 mg/dL (ref 0.3–1.2)
Total Protein: 7.4 g/dL (ref 6.5–8.1)

## 2020-06-04 LAB — CBC WITH DIFFERENTIAL/PLATELET
Abs Immature Granulocytes: 0.4 10*3/uL — ABNORMAL HIGH (ref 0.00–0.07)
Basophils Absolute: 0.1 10*3/uL (ref 0.0–0.1)
Basophils Relative: 0 %
Eosinophils Absolute: 0.3 10*3/uL (ref 0.0–0.5)
Eosinophils Relative: 2 %
HCT: 30.8 % — ABNORMAL LOW (ref 36.0–46.0)
Hemoglobin: 8.3 g/dL — ABNORMAL LOW (ref 12.0–15.0)
Immature Granulocytes: 3 %
Lymphocytes Relative: 8 %
Lymphs Abs: 1.2 10*3/uL (ref 0.7–4.0)
MCH: 27.6 pg (ref 26.0–34.0)
MCHC: 26.9 g/dL — ABNORMAL LOW (ref 30.0–36.0)
MCV: 102.3 fL — ABNORMAL HIGH (ref 80.0–100.0)
Monocytes Absolute: 0.9 10*3/uL (ref 0.1–1.0)
Monocytes Relative: 6 %
Neutro Abs: 12.6 10*3/uL — ABNORMAL HIGH (ref 1.7–7.7)
Neutrophils Relative %: 81 %
Platelets: 186 10*3/uL (ref 150–400)
RBC: 3.01 MIL/uL — ABNORMAL LOW (ref 3.87–5.11)
RDW: 15.1 % (ref 11.5–15.5)
WBC: 15.4 10*3/uL — ABNORMAL HIGH (ref 4.0–10.5)
nRBC: 0.2 % (ref 0.0–0.2)

## 2020-06-04 LAB — URINALYSIS, COMPLETE (UACMP) WITH MICROSCOPIC
Bilirubin Urine: NEGATIVE
Glucose, UA: NEGATIVE mg/dL
Hgb urine dipstick: NEGATIVE
Ketones, ur: NEGATIVE mg/dL
Leukocytes,Ua: NEGATIVE
Nitrite: NEGATIVE
Protein, ur: 30 mg/dL — AB
Specific Gravity, Urine: 1.012 (ref 1.005–1.030)
pH: 5 (ref 5.0–8.0)

## 2020-06-04 LAB — TROPONIN I (HIGH SENSITIVITY): Troponin I (High Sensitivity): 12 ng/L (ref <18)

## 2020-06-04 LAB — BRAIN NATRIURETIC PEPTIDE: B Natriuretic Peptide: 134.4 pg/mL — ABNORMAL HIGH (ref 0.0–100.0)

## 2020-06-04 NOTE — ED Notes (Signed)
LG, LV, GRY, and BLU sent to lab

## 2020-06-04 NOTE — ED Triage Notes (Signed)
Pt arrives from home via [facility] with complaint of SOB since last night, reportedly worse this AM. Pt chronically 4L [place], with EMS reporting SpO2 at 80% on arrival. EMS reports improper [place] tubing that may have been restricting oxyegen flow, changed to their tubing and placed on 5L [place] with SpO2 at 96-100%   Pt history of COPD, reports this feels similar to past exacerbations.   Pt reports use of inhaler and breathing treatments prior to EMS arrival with minimal relief

## 2020-06-04 NOTE — ED Provider Notes (Signed)
[facility] Emergency Department Provider Note  ____________________________________________  Time seen: Approximately 3:34 PM  I have reviewed the triage vital signs and the nursing notes.   HISTORY  Chief Complaint Shortness of Breath    HPI Kristen Schroeder is a 79 y.o. female who presents emergency department via EMS for complaint of shortness of breath.  Patient states that  she had felt more short of breath since last night.  She denies any fevers, chills, nasal congestion, sore throat, cough.  She is on chronic oxygen for her COPD at 4 L via nasal cannula.  Patient states that after EMS arrival she has felt much better.  EMS reports that there was an improper tubing that they believe was likely restricting the patient's oxygen flow.  When he changed to the new tubing and put the patient on 5 L nasal cannula her oxygen rose from 80% on arrival to 100%.  Denies any complaints at this time.  She does have a history of COPD, diabetes, orthopnea, hypertension, congestive cardiac failure, degenerative disc disease, hypothyroidism,        Past Medical History:  Diagnosis Date  . Arthritis   . COPD (chronic obstructive pulmonary disease) ([place])   . Diabetes mellitus without complication ([place])   . Dysrhythmia   . Heart murmur   . History of orthopnea   . Hypertension   . Hypothyroidism   . Neuropathy   . Oxygen dependent    3L  CONTINUOUS  . Pain CHRONIC BACK PAIN  . Shortness of breath dyspnea   . Wheezing     Patient Active Problem List   Diagnosis Date Noted  . Depression, major, single episode, complete remission ([place]) 05/20/2020  . Incarcerated hernia of abdominal cavity 08/04/2018  . SBO (small bowel obstruction) ([place]) 04/22/2018  . Incarcerated hernia 04/14/2018  . Aneurysm ([place]) 07/10/2016  . Nonspecific abnormal finding 05/11/2015  . Allergic rhinitis 05/11/2015  . Anxiety 05/11/2015  . Bronchitis, chronic ([place]) 05/11/2015  . CCF  (congestive cardiac failure) ([place]) 05/11/2015  . Clinical depression 05/11/2015  . 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 Essential (primary) hypertension 05/11/2015  . Acid reflux 05/11/2015  . HLD (hyperlipidemia) 05/11/2015  . Adult hypothyroidism 05/11/2015  . Cervical dysplasia, mild 05/11/2015  . Neuropathy 05/11/2015  . Adiposity 05/11/2015  . Obstructive apnea 05/11/2015  . Arthritis, degenerative 05/11/2015    Past Surgical History:  Procedure Laterality Date  . ABDOMINAL HYSTERECTOMY    . BOWEL RESECTION N/A 04/14/2018   Procedure: SMALL BOWEL RESECTION;  Surgeon: Jules Husbands, MD;  Location: [facility] ORS;  Service: General;  Laterality: N/A;  . BREAST BIOPSY Right    benign  . CATARACT EXTRACTION W/PHACO Left 07/03/2016   Procedure: CATARACT EXTRACTION PHACO AND INTRAOCULAR LENS PLACEMENT (IOC);  Surgeon: Birder Robson, MD;  Location: [facility] ORS;  Service: Ophthalmology;  Laterality: Left;  Lot: [id number] H [place]: 00:40.1 AP%: 17.4 CDE:6.94  . HERNIA REPAIR    . LAPAROTOMY N/A 09/04/2018   Procedure: EXPLORATORY LAPAROTOMY;  Surgeon: Olean Ree, MD;  Location: [facility] ORS;  Service: General;  Laterality: N/A;  . TUBAL LIGATION    . VENTRAL HERNIA REPAIR N/A 04/14/2018   Procedure: HERNIA REPAIR VENTRAL ADULT;  Surgeon: Jules Husbands, MD;  Location: [facility] ORS;  Service: General;  Laterality: N/A;    Prior to Admission medications   Medication Sig Start Date End Date Taking? Authorizing Provider  arformoterol (BROVANA) 15 MCG/2ML NEBU Take 2 mLs (15 mcg total) by  nebulization 2 (two) times daily. 03/04/18   Jerrol Banana., MD  aspirin EC 81 MG tablet Take 81 mg by mouth daily.    [provider]  blood glucose meter kit and supplies KIT Dispense based on patient and insurance preference. Use up to four times daily as directed. (FOR ICD-9 250.00, 250.01). 06/27/18   Jerrol Banana., MD  Blood Glucose Monitoring Suppl (ONE TOUCH ULTRA 2)  w/Device KIT 1 each by Does not apply route daily. 08/27/18   Jerrol Banana., MD  enalapril (VASOTEC) 20 MG tablet TAKE 1 TABLET BY MOUTH EVERY DAY 02/25/20   Jerrol Banana., MD  fluticasone [facility]) 50 MCG/ACT nasal spray Place 1 spray into both nostrils daily as needed.     [provider]  furosemide (LASIX) 20 MG tablet Take 2 tablets (40 mg total) by mouth daily. 04/07/20   Jerrol Banana., MD  gabapentin (NEURONTIN) 300 MG capsule TAKE 2 CAPSULES IN THE MORNING, 1 CAPSULE IN THE AFTERNOON AND 2 CAPSULES IN THE EVENING 06/29/19   Jerrol Banana., MD  ipratropium-albuterol (DUONEB) 0.5-2.5 (3) MG/3ML SOLN TAKE 3 MLS BY NEBULIZATION EVERY 6 (SIX) HOURS AS NEEDED. 04/14/20   Kendell Bane, NP  isosorbide mononitrate (IMDUR) 30 MG 24 hr tablet Take 30 mg by mouth daily as needed (high bp).    [provider]  levothyroxine (SYNTHROID) 125 MCG tablet TAKE 1 TABLET BY MOUTH DAILY 10/27/19   Jerrol Banana., MD  lisinopril (ZESTRIL) 20 MG tablet Take by mouth. 11/20/18   [provider]  loratadine (CLARITIN) 10 MG tablet Take 1 tablet (10 mg total) by mouth daily. 12/18/19   Jerrol Banana., MD  LORazepam (ATIVAN) 0.5 MG tablet Take 1 tablet (0.5 mg total) by mouth 2 (two) times daily as needed for anxiety. 05/20/18   Jerrol Banana., MD  meclizine (ANTIVERT) 12.5 MG tablet Take 1 tablet (12.5 mg total) by mouth 3 (three) times daily as needed for dizziness. 11/23/19   Virginia Crews, MD  metFORMIN (GLUCOPHAGE) 1000 MG tablet TAKE 1 TABLET BY MOUTH TWICE A DAY Patient taking differently: Take 1,000 mg by mouth daily with breakfast.  10/27/19   Jerrol Banana., MD  mometasone-formoterol [facility]) 100-5 MCG/ACT AERO Inhale 2 puffs into the lungs 2 (two) times daily. 03/20/18   Lavera Guise, MD  MULTIPLE VITAMIN PO Take 1 tablet by mouth daily.     [provider]  omeprazole (PRILOSEC) 40 MG capsule Take 1  capsule (40 mg total) by mouth daily. 04/07/20   Jerrol Banana., MD  ondansetron (ZOFRAN-ODT) 4 MG disintegrating tablet TAKE 1 TABLET BY MOUTH EVERY 8 HOURS AS NEEDED FOR NAUSEA AND VOMITING 03/23/20   Jerrol Banana., MD  [facility] ULTRA test strip USE 1 STRIP TO TEST GLUCOSE TWICE DAILY AS NEEDED 07/20/19   Jerrol Banana., MD  OXYGEN Inhale 4 L into the lungs.    [provider]  PARoxetine (PAXIL) 10 MG tablet Take 1 tablet (10 mg total) by mouth at bedtime. 05/20/18   Jerrol Banana., MD  roflumilast (DALIRESP) 500 MCG TABS tablet Take 500 mcg by mouth daily. As needed    [provider]  sertraline (ZOLOFT) 50 MG tablet Take 1 tablet (50 mg total) by mouth daily. 01/20/20   Jerrol Banana., MD  tiotropium (SPIRIVA) 18 MCG inhalation capsule Place into  inhaler and inhale. 09/06/14   [provider]    Allergies Codeine  Family History  Problem Relation Age of Onset  . Heart disease Mother   . Drug abuse Other   . Hypertension Other     Social History Social History   Tobacco Use  . Smoking status: Former Smoker    Packs/day: 0.50    Years: 15.00    Pack years: 7.50  . Smokeless tobacco: Never Used  . Tobacco comment: 30-40 years ago  Vaping Use  . Vaping Use: Never used  Substance Use Topics  . Alcohol use: No    Alcohol/week: 0.0 standard drinks  . Drug use: No     Review of Systems  Constitutional: No fever/chills Eyes: No visual changes. No discharge ENT: No upper respiratory complaints. Cardiovascular: no chest pain. Respiratory: no cough.  Positive SOB. Gastrointestinal: No abdominal pain.  No nausea, no vomiting.  No diarrhea.  No constipation. Genitourinary: Negative for dysuria. No hematuria Musculoskeletal: Negative for musculoskeletal pain. Skin: Negative for rash, abrasions, lacerations, ecchymosis. Neurological: Negative for headaches, focal weakness or numbness. 10-point ROS otherwise  negative.  ____________________________________________   PHYSICAL EXAM:  VITAL SIGNS: ED Triage Vitals  Enc Vitals Group     BP 06/04/20 1531 (!) 151/81     Pulse Rate 06/04/20 1531 (!) 108     Resp 06/04/20 1531 15     Temp 06/04/20 1531 97.8 F (36.6 C)     Temp Source 06/04/20 1531 Oral     SpO2 06/04/20 1530 99 %     Weight 06/04/20 1531 (!) 216 lb 0.8 oz (98 kg)     Height 06/04/20 1531 '5\' 7"'  (1.702 m)     Head Circumference --      Peak Flow --      Pain Score 06/04/20 1531 0     Pain Loc --      Pain Edu? --      Excl. in [place]? --      Constitutional: Alert and oriented. Well appearing and in no acute distress. Eyes: Conjunctivae are normal. PERRL. EOMI. Head: Atraumatic. ENT:      Ears:       Nose: No congestion/rhinnorhea.      Mouth/Throat: Mucous membranes are moist.  Neck: No stridor.   Hematological/Lymphatic/Immunilogical: No cervical lymphadenopathy. Cardiovascular: Normal rate, regular rhythm. Normal S1 and S2.  Good peripheral circulation. Respiratory: Normal respiratory effort without tachypnea or retractions. Lungs CTAB. Good air entry to the bases with no decreased or absent breath sounds. Gastrointestinal: Bowel sounds 4 quadrants. Soft and nontender to palpation. No guarding or rigidity. No palpable masses. No distention. No CVA tenderness. Musculoskeletal: Full range of motion to all extremities. No gross deformities appreciated. Neurologic:  Normal speech and language. No gross focal neurologic deficits are appreciated.  Skin:  Skin is warm, dry and intact. No rash noted. Psychiatric: Mood and affect are normal. Speech and behavior are normal. Patient exhibits appropriate insight and judgement.   ____________________________________________   LABS (all labs ordered are listed, but only abnormal results are displayed)  Labs Reviewed  CBC WITH DIFFERENTIAL/PLATELET - Abnormal; Notable for the following components:      Result Value   WBC  15.4 (*)    RBC 3.01 (*)    Hemoglobin 8.3 (*)    HCT 30.8 (*)    MCV 102.3 (*)    MCHC 26.9 (*)    Neutro Abs 12.6 (*)    Abs Immature Granulocytes 0.40 (*)  All other components within normal limits  BRAIN NATRIURETIC PEPTIDE - Abnormal; Notable for the following components:   B Natriuretic Peptide 134.4 (*)    All other components within normal limits  COMPREHENSIVE METABOLIC PANEL - Abnormal; Notable for the following components:   Chloride 93 (*)    CO2 41 (*)    Glucose, Bld 142 (*)    Calcium 8.6 (*)    All other components within normal limits  URINALYSIS, COMPLETE (UACMP) WITH MICROSCOPIC - Abnormal; Notable for the following components:   Color, Urine YELLOW (*)    APPearance HAZY (*)    Protein, ur 30 (*)    Bacteria, UA RARE (*)    All other components within normal limits  TROPONIN I (HIGH SENSITIVITY)  TROPONIN I (HIGH SENSITIVITY)   ____________________________________________  EKG   ____________________________________________  RADIOLOGY I personally viewed and evaluated these images as part of my medical decision making, as well as reviewing the written report by the radiologist.  DG Chest 2 View  Result Date: 06/04/2020 CLINICAL DATA:  80 year old with acute onset of shortness of breath that began last night and worsened this morning. Chronic oxygen dependent COPD. EXAM: CHEST - 2 VIEW COMPARISON:  06/14/2019 and earlier. FINDINGS: AP ERECT and LATERAL images were obtained. Cardiac silhouette upper normal in size for AP technique, unchanged. Thoracic aorta atherosclerotic, unchanged. Massively enlarged central pulmonary arteries, unchanged. Scarring and fibrosis in the lower lobes, progressive since the most recent prior examination in August, 2020. No confluent or ground-glass airspace consolidation. No visible pleural effusions. Normal pulmonary vascularity. Visualized bony thorax unremarkable. IMPRESSION: 1. No acute cardiopulmonary disease. 2. Massively  enlarged central pulmonary arteries indicating chronic pulmonary arterial hypertension. 3. Progressive fibrosis involving the lower lobes. Electronically Signed   By: Evangeline Dakin M.D.   On: 06/04/2020 17:16    ____________________________________________    PROCEDURES  Procedure(s) performed:    Procedures    Medications - No data to display   ____________________________________________   INITIAL IMPRESSION / ASSESSMENT AND PLAN / ED COURSE  Pertinent labs & imaging results that were available during my care of the patient were reviewed by me and considered in my medical decision making (see chart for details).  Review of the [place] CSRS was performed in accordance of the [place] prior to dispensing any controlled drugs.           Patient's diagnosis is consistent with shortness of breath.  Patient presented to the emergency department via EMS for complaint of shortness of breath at home.  EMS arrived to find patient O2 saturation 80%.  During the investigation they realized that patient had a wrong O2 cord to her nasal cannula.  When they exchanged it for the appropriate nasal cannula, patient's oxygen saturation immediately improved.  Patient arrived in the emergency department with no complaints.  Given her symptoms we did evaluate with labs, chest x-ray.  Patient had a slightly elevated white blood cell count but was afebrile and there is no complaint consistent with infection.  At this time patient has maintained good oxygen saturation throughout her stay.  Has no complaints at this time.  Exam is reassuring.  At this time I feel patient is stable for discharge.  Discussed nonspecific white blood cell count and advised the patient and her husband to return for any signs of infection to include URI symptoms, fevers and chills, cough, increasing shortness of breath, urinary complaints or GI complaints.  They verbalized understanding of same..  The patient's husband  states that he  has the appropriate nasal cannula for her to use at home.  Follow-up primary care as needed.  Patient is given ED precautions to return to the ED for any worsening or new symptoms.     ____________________________________________  FINAL CLINICAL IMPRESSION(S) / ED DIAGNOSES  Final diagnoses:  Shortness of breath      NEW MEDICATIONS STARTED DURING THIS VISIT:  ED Discharge Orders    None          This chart was dictated using voice recognition software/Dragon. Despite best efforts to proofread, errors can occur which can change the meaning. Any change was purely unintentional.    Darletta Moll, PA-C 06/04/20 1951    Nance Pear, MD 06/04/20 2103

## 2020-06-06 MED ORDER — PREDNISONE 20 MG PO TABS: 20.0000 mg | ORAL_TABLET | Freq: Every day | ORAL | 0 refills | Status: DC

## 2020-06-06 NOTE — Telephone Encounter (Signed)
Patient advised that medication has been sent in to pharmacy for cough and congestion.

## 2020-06-14 DIAGNOSIS — J449 Chronic obstructive pulmonary disease, unspecified: Secondary | ICD-10-CM | POA: Diagnosis not present

## 2020-06-23 ENCOUNTER — Other Ambulatory Visit: Payer: Self-pay | Admitting: Neurosurgery

## 2020-06-23 DIAGNOSIS — I671 Cerebral aneurysm, nonruptured: Secondary | ICD-10-CM

## 2020-06-28 ENCOUNTER — Other Ambulatory Visit: Payer: Self-pay

## 2020-06-28 ENCOUNTER — Encounter: Payer: Self-pay | Admitting: Family Medicine

## 2020-06-28 ENCOUNTER — Ambulatory Visit (INDEPENDENT_AMBULATORY_CARE_PROVIDER_SITE_OTHER): Payer: Medicare Other | Admitting: Family Medicine

## 2020-06-28 VITALS — BP 155/87 | HR 92 | Temp 98.5°F | Ht 67.0 in | Wt 208.8 lb

## 2020-06-28 DIAGNOSIS — J8410 Pulmonary fibrosis, unspecified: Secondary | ICD-10-CM

## 2020-06-28 DIAGNOSIS — E7849 Other hyperlipidemia: Secondary | ICD-10-CM

## 2020-06-28 DIAGNOSIS — R7303 Prediabetes: Secondary | ICD-10-CM

## 2020-06-28 DIAGNOSIS — I2721 Secondary pulmonary arterial hypertension: Secondary | ICD-10-CM

## 2020-06-28 DIAGNOSIS — R110 Nausea: Secondary | ICD-10-CM | POA: Diagnosis not present

## 2020-06-28 DIAGNOSIS — I10 Essential (primary) hypertension: Secondary | ICD-10-CM | POA: Diagnosis not present

## 2020-06-28 DIAGNOSIS — I509 Heart failure, unspecified: Secondary | ICD-10-CM

## 2020-06-28 DIAGNOSIS — J9621 Acute and chronic respiratory failure with hypoxia: Secondary | ICD-10-CM

## 2020-06-28 DIAGNOSIS — R634 Abnormal weight loss: Secondary | ICD-10-CM | POA: Diagnosis not present

## 2020-06-28 LAB — POCT GLYCOSYLATED HEMOGLOBIN (HGB A1C)
Estimated Average Glucose: 94
Hemoglobin A1C: 4.9 % (ref 4.0–5.6)

## 2020-06-28 MED ORDER — ONDANSETRON 8 MG PO TBDP: 8.0000 mg | ORAL_TABLET | Freq: Once | ORAL | 3 refills | Status: AC

## 2020-06-28 NOTE — Progress Notes (Signed)
Trena Platt Cummings,acting as a scribe for Wilhemena Durie, MD.,have documented all relevant documentation on the behalf of Wilhemena Durie, MD,as directed by  Wilhemena Durie, MD while in the presence of Wilhemena Durie, MD.  Established patient visit   Patient: Kristen Schroeder   DOB: 1941-08-25   79 y.o. Female  MRN: [id number] Visit Date: 06/28/2020  Today's healthcare provider: Wilhemena Durie, MD   Chief Complaint  Patient presents with  . Prediabetes   Subjective    HPI  Patient does not feel well.  Feels weak and nauseated.  She has no appetite.  Her breathing is stable presently. Recent x-ray from the emergency room shows progressive pulmonary fibrosis and lower lung fields.  She has massively enlarged central pulmonary arteries negating chronic pulmonary artery hypertension. Patient says that she has been having some feet and leg swelling that started a few weeks ago.   Patient also said she has not had an appetite and has been nauseous for the past 2 days.   Prediabetes, Follow-up  Lab Results  Component Value Date   HGBA1C 4.9 06/28/2020   HGBA1C 5.2 02/29/2020   HGBA1C 5.3 09/15/2019   GLUCOSE 142 (H) 06/04/2020   GLUCOSE 90 04/28/2020   GLUCOSE 107 (H) 04/07/2020    Last seen for for this4 months ago.  Management since that visit includes no changes, under control. Current symptoms include hypoglycemia  and nausea and have been improving.  Prior visit with dietician: no Current diet: in general, a "healthy" diet   Current exercise: none  Pertinent Labs:    Component Value Date/Time   CHOL 183 12/15/2019 1118   TRIG 117 12/15/2019 1118   CHOLHDL 3.2 12/15/2019 1118   CREATININE 0.64 06/04/2020 1525    Wt Readings from Last 3 Encounters:  06/28/20 208 lb 12.8 oz (94.7 kg)  06/04/20 (!) 216 lb 0.8 oz (98 kg)  05/18/20 225 lb (102.1 kg)     -----------------------------------------------------------------------------------------  ---------------------------------------------------------------------------------------------------   Social History   Tobacco Use  . Smoking status: Former Smoker    Packs/day: 0.50    Years: 15.00    Pack years: 7.50  . Smokeless tobacco: Never Used  . Tobacco comment: 30-40 years ago  Vaping Use  . Vaping Use: Never used  Substance Use Topics  . Alcohol use: No    Alcohol/week: 0.0 standard drinks  . Drug use: No       Medications: Outpatient Medications Prior to Visit  Medication Sig  . arformoterol (BROVANA) 15 MCG/2ML NEBU Take 2 mLs (15 mcg total) by nebulization 2 (two) times daily.  Marland Kitchen aspirin EC 81 MG tablet Take 81 mg by mouth daily.  . blood glucose meter kit and supplies KIT Dispense based on patient and insurance preference. Use up to four times daily as directed. (FOR ICD-9 250.00, 250.01).  . Blood Glucose Monitoring Suppl (ONE TOUCH ULTRA 2) w/Device KIT 1 each by Does not apply route daily.  . enalapril (VASOTEC) 20 MG tablet TAKE 1 TABLET BY MOUTH EVERY DAY  . fluticasone (FLONASE) 50 MCG/ACT nasal spray Place 1 spray into both nostrils daily as needed.   . furosemide (LASIX) 20 MG tablet Take 2 tablets (40 mg total) by mouth daily.  Marland Kitchen gabapentin (NEURONTIN) 300 MG capsule TAKE 2 CAPSULES IN THE MORNING, 1 CAPSULE IN THE AFTERNOON AND 2 CAPSULES IN THE EVENING  . ipratropium-albuterol (DUONEB) 0.5-2.5 (3) MG/3ML SOLN TAKE 3 MLS BY NEBULIZATION EVERY 6 (SIX) HOURS  AS NEEDED.  Marland Kitchen isosorbide mononitrate (IMDUR) 30 MG 24 hr tablet Take 30 mg by mouth daily as needed (high bp).  Marland Kitchen levothyroxine (SYNTHROID) 125 MCG tablet TAKE 1 TABLET BY MOUTH DAILY  . lisinopril (ZESTRIL) 20 MG tablet Take by mouth.  . loratadine (CLARITIN) 10 MG tablet Take 1 tablet (10 mg total) by mouth daily.  Marland Kitchen LORazepam (ATIVAN) 0.5 MG tablet Take 1 tablet (0.5 mg total) by mouth 2 (two) times  daily as needed for anxiety.  . meclizine (ANTIVERT) 12.5 MG tablet Take 1 tablet (12.5 mg total) by mouth 3 (three) times daily as needed for dizziness.  . metFORMIN (GLUCOPHAGE) 1000 MG tablet TAKE 1 TABLET BY MOUTH TWICE A DAY (Patient taking differently: Take 1,000 mg by mouth daily with breakfast. )  . mometasone-formoterol (DULERA) 100-5 MCG/ACT AERO Inhale 2 puffs into the lungs 2 (two) times daily.  . MULTIPLE VITAMIN PO Take 1 tablet by mouth daily.   Marland Kitchen omeprazole (PRILOSEC) 40 MG capsule Take 1 capsule (40 mg total) by mouth daily.  [facility] ULTRA test strip USE 1 STRIP TO TEST GLUCOSE TWICE DAILY AS NEEDED  . OXYGEN Inhale 4 L into the lungs.  Marland Kitchen PARoxetine (PAXIL) 10 MG tablet Take 1 tablet (10 mg total) by mouth at bedtime.  . predniSONE (DELTASONE) 20 MG tablet Take 1 tablet (20 mg total) by mouth daily with breakfast.  . roflumilast (DALIRESP) 500 MCG TABS tablet Take 500 mcg by mouth daily. As needed  . sertraline (ZOLOFT) 50 MG tablet Take 1 tablet (50 mg total) by mouth daily.  Marland Kitchen tiotropium (SPIRIVA) 18 MCG inhalation capsule Place into inhaler and inhale.  . [DISCONTINUED] ondansetron (ZOFRAN-ODT) 4 MG disintegrating tablet TAKE 1 TABLET BY MOUTH EVERY 8 HOURS AS NEEDED FOR NAUSEA AND VOMITING   No facility-administered medications prior to visit.    Review of Systems  Last hemoglobin A1c Lab Results  Component Value Date   HGBA1C 4.9 06/28/2020      Objective    BP (!) 155/87 (BP Location: Left Arm, Patient Position: Sitting, Cuff Size: Large)   Pulse 92   Temp 98.5 F (36.9 C) (Oral)   Ht _0  (1.702 m)   Wt 208 lb 12.8 oz (94.7 kg)   BMI 32.70 kg/m  BP Readings from Last 3 Encounters:  06/28/20 (!) 155/87  06/04/20 (!) 142/94  05/18/20 122/69   Wt Readings from Last 3 Encounters:  06/28/20 208 lb 12.8 oz (94.7 kg)  06/04/20 (!) 216 lb 0.8 oz (98 kg)  05/18/20 225 lb (102.1 kg)      Physical Exam Vitals and nursing note reviewed.   Constitutional:      General: She is not in acute distress.    Appearance: Normal appearance. She is not toxic-appearing.  HENT:     Head: Normocephalic and atraumatic.     Right Ear: External ear normal.     Left Ear: External ear normal.     Nose: Nose normal.     Mouth/Throat:     Mouth: Mucous membranes are moist.     Pharynx: Oropharynx is clear.  Eyes:     General: No scleral icterus.    Conjunctiva/sclera: Conjunctivae normal.  Cardiovascular:     Rate and Rhythm: Normal rate and regular rhythm.     Pulses: Normal pulses.     Heart sounds: Normal heart sounds.  Pulmonary:     Effort: Pulmonary effort is normal.     Breath sounds: Normal breath  sounds.  Abdominal:     Palpations: Abdomen is soft.     Tenderness: There is no abdominal tenderness.  Musculoskeletal:     Cervical back: Normal range of motion and neck supple.     Comments: Trace lower extremities ankle and pedal edema  Skin:    General: Skin is warm and dry.  Neurological:     Mental Status: She is alert and oriented to person, place, and time.  Psychiatric:        Mood and Affect: Mood normal.        Behavior: Behavior normal.        Thought Content: Thought content normal.        Judgment: Judgment normal.       Results for orders placed or performed in visit on 06/28/20  POCT HgB A1C  Result Value Ref Range   Hemoglobin A1C 4.9 4.0 - 5.6 %   Estimated Average Glucose 94     Assessment & Plan     1. Prediabetes A1c is 4.9 so patient no longer diabetic with weight loss.  Follow-up blood sugars routinely.  Stop  Metformin. - POCT HgB A1C - CBC with Differential/Platelet - Comprehensive metabolic panel - TSH - Lipase  2. Essential (primary) hypertension Controlled. - CBC with Differential/Platelet - Comprehensive metabolic panel - TSH - Lipase  3. Other hyperlipidemia  - CBC with Differential/Platelet - Comprehensive metabolic panel - TSH - Lipase  4. Weight loss Will obtain  labs but I think the main issue is failure to thrive secondary to the pulmonary hypertension and pulmonary fibrosis. - CBC with Differential/Platelet - Comprehensive metabolic panel - TSH - Lipase  5. Nausea  - ondansetron (ZOFRAN-ODT) 8 MG disintegrating tablet; Take 1 tablet (8 mg total) by mouth once for 1 dose.  Dispense: 60 tablet; Refill: 3  6. Congestive heart failure, unspecified HF chronicity, unspecified heart failure type ([place])   7. Pulmonary fibrosis (HCC) For back to pulmonary  8. Pulmonary artery hypertension (HCC) Back to pulmonary  9. Acute on chronic respiratory failure with hypoxemia (HCC) She is on 24-hour oxygen by nasal cannula   No follow-ups on file.         Jetson Pickrel Cranford Mon, MD  [facility] [phone] (phone) [phone] (fax)  [place]

## 2020-06-29 ENCOUNTER — Telehealth: Payer: Self-pay

## 2020-06-29 LAB — CBC WITH DIFFERENTIAL/PLATELET
Basophils Absolute: 0 10*3/uL (ref 0.0–0.2)
Basos: 0 %
EOS (ABSOLUTE): 0.1 10*3/uL (ref 0.0–0.4)
Eos: 2 %
Hematocrit: 27.8 % — ABNORMAL LOW (ref 34.0–46.6)
Hemoglobin: 8.5 g/dL — ABNORMAL LOW (ref 11.1–15.9)
Immature Grans (Abs): 0 10*3/uL (ref 0.0–0.1)
Immature Granulocytes: 0 %
Lymphocytes Absolute: 0.7 10*3/uL (ref 0.7–3.1)
Lymphs: 10 %
MCH: 26.5 pg — ABNORMAL LOW (ref 26.6–33.0)
MCHC: 30.6 g/dL — ABNORMAL LOW (ref 31.5–35.7)
MCV: 87 fL (ref 79–97)
Monocytes Absolute: 0.7 10*3/uL (ref 0.1–0.9)
Monocytes: 10 %
Neutrophils Absolute: 5.6 10*3/uL (ref 1.4–7.0)
Neutrophils: 78 %
Platelets: 180 10*3/uL (ref 150–450)
RBC: 3.21 x10E6/uL — ABNORMAL LOW (ref 3.77–5.28)
RDW: 13.3 % (ref 11.7–15.4)
WBC: 7.2 10*3/uL (ref 3.4–10.8)

## 2020-06-29 LAB — COMPREHENSIVE METABOLIC PANEL
ALT: 6 IU/L (ref 0–32)
AST: 15 IU/L (ref 0–40)
Albumin/Globulin Ratio: 1.5 (ref 1.2–2.2)
Albumin: 4 g/dL (ref 3.7–4.7)
Alkaline Phosphatase: 90 IU/L (ref 48–121)
BUN/Creatinine Ratio: 14 (ref 12–28)
BUN: 6 mg/dL — ABNORMAL LOW (ref 8–27)
Bilirubin Total: <0.2 mg/dL (ref 0.0–1.2)
CO2: 43 mmol/L (ref 20–29)
Calcium: 9.4 mg/dL (ref 8.7–10.3)
Chloride: 94 mmol/L — ABNORMAL LOW (ref 96–106)
Creatinine, Ser: 0.43 mg/dL — ABNORMAL LOW (ref 0.57–1.00)
GFR calc Af Amer: 112 mL/min/{1.73_m2} (ref >59)
GFR calc non Af Amer: 97 mL/min/{1.73_m2} (ref >59)
Globulin, Total: 2.6 g/dL (ref 1.5–4.5)
Glucose: 100 mg/dL — ABNORMAL HIGH (ref 65–99)
Potassium: 4.4 mmol/L (ref 3.5–5.2)
Sodium: 146 mmol/L — ABNORMAL HIGH (ref 134–144)
Total Protein: 6.6 g/dL (ref 6.0–8.5)

## 2020-06-29 LAB — TSH: TSH: 1.33 u[IU]/mL (ref 0.450–4.500)

## 2020-06-29 LAB — LIPASE: Lipase: 12 U/L — ABNORMAL LOW (ref 14–85)

## 2020-06-29 NOTE — Telephone Encounter (Signed)
Patient advised to stop taking metformin.

## 2020-06-29 NOTE — Addendum Note (Signed)
Addended by: Aleene Davidson on: 06/29/2020 01:39 PM   Modules accepted: Orders

## 2020-06-29 NOTE — Telephone Encounter (Signed)
Yes, stop Metformin.

## 2020-06-29 NOTE — Telephone Encounter (Signed)
Copied from CRM [id number]. Topic: General - Other >> Jun 29, 2020 10:36 AM Dalphine Handing A wrote: Patient wants to know if she needs to continue to take her diabetes medication. Please advise

## 2020-06-30 ENCOUNTER — Telehealth: Payer: Self-pay

## 2020-06-30 DIAGNOSIS — J8410 Pulmonary fibrosis, unspecified: Secondary | ICD-10-CM

## 2020-06-30 NOTE — Telephone Encounter (Signed)
-----   Message from Maple Hudson., MD sent at 06/30/2020 12:59 PM EDT ----- Labs clinically stable.

## 2020-06-30 NOTE — Telephone Encounter (Signed)
Patient was advised of lab results, she states thatshe is still feeling nauseas she denies abdomoinal, flank pain,fever or GI upset. Patient reports that she has had a decrease in appetite and reports that she has been more disoriented and confused as of recent. KW

## 2020-06-30 NOTE — Telephone Encounter (Signed)
I tried to call Kristen Schroeder and got no answer on her phone.  I think this is progression of her pulmonary fibrosis and associated disease.  See if she would be interested in palliative care: seeing them to see if they can get some help for her.

## 2020-07-01 NOTE — Telephone Encounter (Addendum)
Patient advised that referral has been placed to pulmonary.

## 2020-07-02 DIAGNOSIS — J449 Chronic obstructive pulmonary disease, unspecified: Secondary | ICD-10-CM | POA: Diagnosis not present

## 2020-07-09 ENCOUNTER — Other Ambulatory Visit: Payer: Self-pay | Admitting: Family Medicine

## 2020-07-09 NOTE — Telephone Encounter (Signed)
Requested Prescriptions  Pending Prescriptions Disp Refills   levothyroxine (SYNTHROID) 125 MCG tablet [Pharmacy Med Name: LEVOTHYROXINE 125 MCG TABLET] 90 tablet 1    Sig: TAKE 1 TABLET BY MOUTH DAILY     Endocrinology:  Hypothyroid Agents Failed - 07/09/2020 12:40 AM      Failed - TSH needs to be rechecked within 3 months after an abnormal result. Refill until TSH is due.      Passed - TSH in normal range and within 360 days    TSH  Date Value Ref Range Status  06/28/2020 1.330 0.450 - 4.500 uIU/mL Final         Passed - Valid encounter within last 12 months    Recent Outpatient Visits          1 week ago Prediabetes   [facility] Maple Hudson., MD   1 month ago Pulmonary fibrosis [facility])   [facility] Maple Hudson., MD   2 months ago Essential (primary) hypertension   [facility] Maple Hudson., MD   3 months ago Pain of upper abdomen   [facility] Maple Hudson., MD   3 months ago Nausea   [facility] Trey Sailors, [place]      Future Appointments            In 2 weeks Maple Hudson., MD [facility]

## 2020-07-12 ENCOUNTER — Telehealth: Payer: Self-pay | Admitting: Family Medicine

## 2020-07-12 NOTE — Telephone Encounter (Signed)
Joni Reining is calling from [employer] company is checking on fax that was sent 06/16/20 regarding missing information. Please advise CB- [phone]

## 2020-07-14 ENCOUNTER — Ambulatory Visit
Admission: RE | Admit: 2020-07-14 | Discharge: 2020-07-14 | Disposition: A | Discharge status: Home / Self Care | Payer: Medicare Other | Source: Ambulatory Visit | Attending: Neurosurgery | Admitting: Neurosurgery

## 2020-07-14 ENCOUNTER — Other Ambulatory Visit: Payer: Self-pay

## 2020-07-14 DIAGNOSIS — I671 Cerebral aneurysm, nonruptured: Secondary | ICD-10-CM | POA: Diagnosis not present

## 2020-07-14 MED ORDER — IOPAMIDOL (ISOVUE-370) INJECTION 76%
75.0000 mL | Freq: Once | INTRAVENOUS | Status: AC | PRN
  Administered 2020-07-14: 75 mL via INTRAVENOUS

## 2020-07-15 ENCOUNTER — Telehealth: Payer: Self-pay

## 2020-07-15 DIAGNOSIS — J449 Chronic obstructive pulmonary disease, unspecified: Secondary | ICD-10-CM | POA: Diagnosis not present

## 2020-07-15 NOTE — Telephone Encounter (Signed)
LMOM CONFIRMED PT APPT 07/19/20. BR 

## 2020-07-19 ENCOUNTER — Encounter: Payer: Self-pay | Admitting: Internal Medicine

## 2020-07-19 ENCOUNTER — Ambulatory Visit: Payer: Medicare Other | Admitting: Internal Medicine

## 2020-07-19 DIAGNOSIS — I509 Heart failure, unspecified: Secondary | ICD-10-CM

## 2020-07-19 DIAGNOSIS — R0602 Shortness of breath: Secondary | ICD-10-CM | POA: Diagnosis not present

## 2020-07-19 DIAGNOSIS — J449 Chronic obstructive pulmonary disease, unspecified: Secondary | ICD-10-CM

## 2020-07-19 DIAGNOSIS — J9611 Chronic respiratory failure with hypoxia: Secondary | ICD-10-CM

## 2020-07-19 DIAGNOSIS — I671 Cerebral aneurysm, nonruptured: Secondary | ICD-10-CM | POA: Diagnosis not present

## 2020-07-19 NOTE — Progress Notes (Signed)
[facility] [place], [place] [address]  Pulmonary Sleep Medicine   Office Visit Note  Patient Name: Kristen Schroeder DOB: 1941-06-18 MRN [id number]  Date of Service: 07/20/2020  Complaints/HPI: Patient is here today for routine pulmonary follow-up She feels as though her breathing is at her baseline, she is wearing 4LPM via [place] supplemental oxygen, she says her SpO2 levels have remained stable She did recently call 911 due to difficulty breathing, when EMT's arrived her oxygen levels were normal, her husband says they determined she was having a panic attack She continues to use Brovana, Spiriva and Dulera as well as her nebulizer daily   ROS  General: (-) fever, (-) chills, (-) night sweats, (-) weakness Skin: (-) rashes, (-) itching,. Eyes: (-) visual changes, (-) redness, (-) itching. Nose and Sinuses: (-) nasal stuffiness or itchiness, (-) postnasal drip, (-) nosebleeds, (-) sinus trouble. Mouth and Throat: (-) sore throat, (-) hoarseness. Neck: (-) swollen glands, (-) enlarged thyroid, (-) neck pain. Respiratory: - cough, (-) bloody sputum, + shortness of breath, + wheezing. Cardiovascular: - ankle swelling, (-) chest pain. Lymphatic: (-) lymph node enlargement. Neurologic: (-) numbness, (-) tingling. Psychiatric: (-) anxiety, (-) depression   Current Medication: Outpatient Encounter Medications as of 07/19/2020  Medication Sig Note  . arformoterol (BROVANA) 15 MCG/2ML NEBU Take 2 mLs (15 mcg total) by nebulization 2 (two) times daily.   Marland Kitchen aspirin EC 81 MG tablet Take 81 mg by mouth daily.   . blood glucose meter kit and supplies KIT Dispense based on patient and insurance preference. Use up to four times daily as directed. (FOR ICD-9 250.00, 250.01).   . Blood Glucose Monitoring Suppl (ONE TOUCH ULTRA 2) w/Device KIT 1 each by Does not apply route daily.   . enalapril (VASOTEC) 20 MG tablet TAKE 1 TABLET BY MOUTH EVERY DAY   . fluticasone  (FLONASE) 50 MCG/ACT nasal spray Place 1 spray into both nostrils daily as needed.    . furosemide (LASIX) 20 MG tablet Take 2 tablets (40 mg total) by mouth daily.   Marland Kitchen gabapentin (NEURONTIN) 300 MG capsule TAKE 2 CAPSULES IN THE MORNING, 1 CAPSULE IN THE AFTERNOON AND 2 CAPSULES IN THE EVENING   . ipratropium-albuterol (DUONEB) 0.5-2.5 (3) MG/3ML SOLN TAKE 3 MLS BY NEBULIZATION EVERY 6 (SIX) HOURS AS NEEDED.   Marland Kitchen isosorbide mononitrate (IMDUR) 30 MG 24 hr tablet Take 30 mg by mouth daily as needed (high bp).   Marland Kitchen levothyroxine (SYNTHROID) 125 MCG tablet TAKE 1 TABLET BY MOUTH DAILY   . lisinopril (ZESTRIL) 20 MG tablet Take by mouth.   . loratadine (CLARITIN) 10 MG tablet Take 1 tablet (10 mg total) by mouth daily.   Marland Kitchen LORazepam (ATIVAN) 0.5 MG tablet Take 1 tablet (0.5 mg total) by mouth 2 (two) times daily as needed for anxiety.   . meclizine (ANTIVERT) 12.5 MG tablet Take 1 tablet (12.5 mg total) by mouth 3 (three) times daily as needed for dizziness.   . mometasone-formoterol (DULERA) 100-5 MCG/ACT AERO Inhale 2 puffs into the lungs 2 (two) times daily.   . MULTIPLE VITAMIN PO Take 1 tablet by mouth daily.    Marland Kitchen omeprazole (PRILOSEC) 40 MG capsule Take 1 capsule (40 mg total) by mouth daily.   Glory Rosebush ULTRA test strip USE 1 STRIP TO TEST GLUCOSE TWICE DAILY AS NEEDED   . OXYGEN Inhale 4 L into the lungs. 10/15/2018: Patient uses 3 to 3 1/2 liters continuously  . PARoxetine (PAXIL)  10 MG tablet Take 1 tablet (10 mg total) by mouth at bedtime.   . predniSONE (DELTASONE) 20 MG tablet Take 1 tablet (20 mg total) by mouth daily with breakfast.   . roflumilast (DALIRESP) 500 MCG TABS tablet Take 500 mcg by mouth daily. As needed   . sertraline (ZOLOFT) 50 MG tablet Take 1 tablet (50 mg total) by mouth daily.   Marland Kitchen tiotropium (SPIRIVA) 18 MCG inhalation capsule Place into inhaler and inhale. (Patient not taking: Reported on 07/19/2020) 10/15/2018: Patient will resume taking once dulera inhaler is  completed (until she can afford new dulera)   No facility-administered encounter medications on file as of 07/19/2020.    Surgical History: Past Surgical History:  Procedure Laterality Date  . ABDOMINAL HYSTERECTOMY    . BOWEL RESECTION N/A 04/14/2018   Procedure: SMALL BOWEL RESECTION;  Surgeon: Jules Husbands, MD;  Location: [facility] ORS;  Service: General;  Laterality: N/A;  . BREAST BIOPSY Right    benign  . CATARACT EXTRACTION W/PHACO Left 07/03/2016   Procedure: CATARACT EXTRACTION PHACO AND INTRAOCULAR LENS PLACEMENT (IOC);  Surgeon: Birder Robson, MD;  Location: [facility] ORS;  Service: Ophthalmology;  Laterality: Left;  Lot: [id number] H [place]: 00:40.1 AP%: 17.4 CDE:6.94  . HERNIA REPAIR    . LAPAROTOMY N/A 09/04/2018   Procedure: EXPLORATORY LAPAROTOMY;  Surgeon: Olean Ree, MD;  Location: [facility] ORS;  Service: General;  Laterality: N/A;  . TUBAL LIGATION    . VENTRAL HERNIA REPAIR N/A 04/14/2018   Procedure: HERNIA REPAIR VENTRAL ADULT;  Surgeon: Jules Husbands, MD;  Location: [facility] ORS;  Service: General;  Laterality: N/A;    Medical History: Past Medical History:  Diagnosis Date  . Arthritis   . COPD (chronic obstructive pulmonary disease) ([place])   . Diabetes mellitus without complication ([place])   . Dysrhythmia   . Heart murmur   . History of orthopnea   . Hypertension   . Hypothyroidism   . Neuropathy   . Oxygen dependent    3L  CONTINUOUS  . Pain CHRONIC BACK PAIN  . Shortness of breath dyspnea   . Wheezing     Family History: Family History  Problem Relation Age of Onset  . Heart disease Mother   . Drug abuse Other   . Hypertension Other     Social History: Social History   Socioeconomic History  . Marital status: Married    Spouse name: Not on file  . Number of children: 4  . Years of education: Not on file  . Highest education level: 8th grade  Occupational History  . Occupation: retired  Tobacco Use  . Smoking status: Former Smoker    Packs/day: 0.50     Years: 15.00    Pack years: 7.50  . Smokeless tobacco: Never Used  . Tobacco comment: 30-40 years ago  Vaping Use  . Vaping Use: Never used  Substance and Sexual Activity  . Alcohol use: No    Alcohol/week: 0.0 standard drinks  . Drug use: No  . Sexual activity: Never  Other Topics Concern  . Not on file  Social History Narrative  . Not on file   Social Determinants of Health   Financial Resource Strain: Low Risk   . Difficulty of Paying Living Expenses: Not hard at all  Food Insecurity: No Food Insecurity  . Worried About Charity fundraiser in the Last Year: Never true  . Ran Out of Food in the Last Year: Never true  Transportation Needs: No Transportation  Needs  . Lack of Transportation (Medical): No  . Lack of Transportation (Non-Medical): No  Physical Activity: Inactive  . Days of Exercise per Week: 0 days  . Minutes of Exercise per Session: 0 min  Stress: No Stress Concern Present  . Feeling of Stress : Only a little  Social Connections: Moderately Integrated  . Frequency of Communication with Friends and Family: More than three times a week  . Frequency of Social Gatherings with Friends and Family: More than three times a week  . Attends Religious Services: More than 4 times per year  . Active Member of Clubs or Organizations: No  . Attends Archivist Meetings: Never  . Marital Status: Married  Human resources officer Violence: Not At Risk  . Fear of Current or Ex-Partner: No  . Emotionally Abused: No  . Physically Abused: No  . Sexually Abused: No    Vital Signs: Blood pressure (!) 167/64, pulse 91, temperature 98.1 F (36.7 C), resp. rate 16, height '5\' 7"'  (1.702 m), SpO2 96 %.  Examination: General Appearance: The patient is well-developed, well-nourished, and in no distress. Skin: Gross inspection of skin unremarkable. Head: normocephalic, no gross deformities. Eyes: no gross deformities noted. ENT: ears appear grossly normal no exudates. Neck:  Supple. No thyromegaly. No LAD. Respiratory: Diminished with wheezing at bases, no rhonchi. Cardiovascular: Normal S1 and S2 without murmur or rub. Extremities: No cyanosis. pulses are equal. Neurologic: Alert and oriented. No involuntary movements.  LABS: Recent Results (from the past 2160 hour(s))  Renal function panel     Status: Abnormal   Collection Time: 04/28/20  4:24 PM  Result Value Ref Range   Glucose 90 65 - 99 mg/dL   BUN 9 8 - 27 mg/dL   Creatinine, Ser 0.60 0.57 - 1.00 mg/dL   GFR calc non Af Amer 88 >59 mL/min/1.73   GFR calc Af Amer 101 >59 mL/min/1.73    Comment: **[facility] currently reports eGFR in compliance with the current**   recommendations of the [employer]. [facility] will   update reporting as new guidelines are published from the NKF-ASN   Task force.    BUN/Creatinine Ratio 15 12 - 28   Sodium 145 (H) 134 - 144 mmol/L   Potassium 4.3 3.5 - 5.2 mmol/L   Chloride 96 96 - 106 mmol/L   CO2 41 (HH) 20 - 29 mmol/L   Calcium 8.8 8.7 - 10.3 mg/dL   Phosphorus 3.2 3.0 - 4.3 mg/dL   Albumin 3.8 3.7 - 4.7 g/dL  CBC with Differential     Status: Abnormal   Collection Time: 06/04/20  3:25 PM  Result Value Ref Range   WBC 15.4 (H) 4.0 - 10.5 K/uL   RBC 3.01 (L) 3.87 - 5.11 MIL/uL   Hemoglobin 8.3 (L) 12.0 - 15.0 g/dL   HCT 30.8 (L) 36 - 46 %   MCV 102.3 (H) 80.0 - 100.0 fL   MCH 27.6 26.0 - 34.0 pg   MCHC 26.9 (L) 30.0 - 36.0 g/dL   RDW 15.1 11.5 - 15.5 %   Platelets 186 150 - 400 K/uL   nRBC 0.2 0.0 - 0.2 %   Neutrophils Relative % 81 %   Neutro Abs 12.6 (H) 1.7 - 7.7 K/uL   Lymphocytes Relative 8 %   Lymphs Abs 1.2 0.7 - 4.0 K/uL   Monocytes Relative 6 %   Monocytes Absolute 0.9 0 - 1 K/uL   Eosinophils Relative 2 %   Eosinophils Absolute 0.3  0 - 0 K/uL   Basophils Relative 0 %   Basophils Absolute 0.1 0 - 0 K/uL   Immature Granulocytes 3 %   Abs Immature Granulocytes 0.40 (H) 0.00 - 0.07 K/uL    Comment: Performed at [facility], [address] [place], [place] [address]  Brain natriuretic peptide     Status: Abnormal   Collection Time: 06/04/20  3:25 PM  Result Value Ref Range   B Natriuretic Peptide 134.4 (H) 0.0 - 100.0 pg/mL    Comment: Performed at [facility], [address] [place], [place] [address]  Comprehensive metabolic panel     Status: Abnormal   Collection Time: 06/04/20  3:25 PM  Result Value Ref Range   Sodium 142 135 - 145 mmol/L   Potassium 4.5 3.5 - 5.1 mmol/L    Comment: HEMOLYSIS AT THIS LEVEL MAY AFFECT RESULT   Chloride 93 (L) 98 - 111 mmol/L   CO2 41 (H) 22 - 32 mmol/L   Glucose, Bld 142 (H) 70 - 99 mg/dL    Comment: Glucose reference range applies only to samples taken after fasting for at least 8 hours.   BUN 9 8 - 23 mg/dL   Creatinine, Ser 0.64 0.44 - 1.00 mg/dL   Calcium 8.6 (L) 8.9 - 10.3 mg/dL   Total Protein 7.4 6.5 - 8.1 g/dL   Albumin 3.9 3.5 - 5.0 g/dL   AST 39 15 - 41 U/L   ALT 12 0 - 44 U/L   Alkaline Phosphatase 73 38 - 126 U/L   Total Bilirubin 0.8 0.3 - 1.2 mg/dL   GFR calc non Af Amer >60 >60 mL/min   GFR calc Af Amer >60 >60 mL/min   Anion gap 8 5 - 15    Comment: Performed at [facility], [place], [place] [address]  Troponin I (High Sensitivity)     Status: None   Collection Time: 06/04/20  3:25 PM  Result Value Ref Range   Troponin I (High Sensitivity) 12 <18 ng/L    Comment: (NOTE) Elevated high sensitivity troponin I (hsTnI) values and significant  changes across serial measurements may suggest ACS but many other  chronic and acute conditions are known to elevate hsTnI results.  Refer to the "Links" section for chest pain algorithms and additional  guidance. Performed at [facility], [address] [place], [place] [address]   Urinalysis, Complete w Microscopic     Status: Abnormal   Collection Time: 06/04/20  5:15 PM  Result Value Ref Range   Color, Urine YELLOW (A) YELLOW   APPearance HAZY  (A) CLEAR   Specific Gravity, Urine 1.012 1.005 - 1.030   pH 5.0 5.0 - 8.0   Glucose, UA NEGATIVE NEGATIVE mg/dL   Hgb urine dipstick NEGATIVE NEGATIVE   Bilirubin Urine NEGATIVE NEGATIVE   Ketones, ur NEGATIVE NEGATIVE mg/dL   Protein, ur 30 (A) NEGATIVE mg/dL   Nitrite NEGATIVE NEGATIVE   Leukocytes,Ua NEGATIVE NEGATIVE   RBC / HPF 0-5 0 - 5 RBC/hpf   WBC, UA 0-5 0 - 5 WBC/hpf   Bacteria, UA RARE (A) NONE SEEN   Squamous Epithelial / LPF 0-5 0 - 5   Mucus PRESENT    Hyaline Casts, UA PRESENT     Comment: Performed at [facility], [address] [place], [place] [address]  POCT HgB A1C     Status: Normal   Collection Time: 06/28/20  3:31 PM  Result  Value Ref Range   Hemoglobin A1C 4.9 4.0 - 5.6 %   Estimated Average Glucose 94   CBC with Differential/Platelet     Status: Abnormal   Collection Time: 06/28/20  3:46 PM  Result Value Ref Range   WBC 7.2 3.4 - 10.8 x10E3/uL   RBC 3.21 (L) 3.77 - 5.28 x10E6/uL   Hemoglobin 8.5 (L) 11.1 - 15.9 g/dL   Hematocrit 27.8 (L) 34.0 - 46.6 %   MCV 87 79 - 97 fL   MCH 26.5 (L) 26.6 - 33.0 pg   MCHC 30.6 (L) 31 - 35 g/dL   RDW 13.3 11.7 - 15.4 %   Platelets 180 150 - 450 x10E3/uL   Neutrophils 78 Not Estab. %   Lymphs 10 Not Estab. %   Monocytes 10 Not Estab. %   Eos 2 Not Estab. %   Basos 0 Not Estab. %   Neutrophils Absolute 5.6 1 - 7 x10E3/uL   Lymphocytes Absolute 0.7 0 - 3 x10E3/uL   Monocytes Absolute 0.7 0 - 0 x10E3/uL   EOS (ABSOLUTE) 0.1 0.0 - 0.4 x10E3/uL   Basophils Absolute 0.0 0 - 0 x10E3/uL   Immature Granulocytes 0 Not Estab. %   Immature Grans (Abs) 0.0 0.0 - 0.1 x10E3/uL  Comprehensive metabolic panel     Status: Abnormal   Collection Time: 06/28/20  3:46 PM  Result Value Ref Range   Glucose 100 (H) 65 - 99 mg/dL   BUN 6 (L) 8 - 27 mg/dL   Creatinine, Ser 0.43 (L) 0.57 - 1.00 mg/dL   GFR calc non Af Amer 97 >59 mL/min/1.73   GFR calc Af Amer 112 >59 mL/min/1.73    Comment: **[facility] currently reports  eGFR in compliance with the current**   recommendations of the [employer]. [facility] will   update reporting as new guidelines are published from the NKF-ASN   Task force.    BUN/Creatinine Ratio 14 12 - 28   Sodium 146 (H) 134 - 144 mmol/L   Potassium 4.4 3.5 - 5.2 mmol/L   Chloride 94 (L) 96 - 106 mmol/L   CO2 43 (HH) 20 - 29 mmol/L   Calcium 9.4 8.7 - 10.3 mg/dL   Total Protein 6.6 6.0 - 8.5 g/dL   Albumin 4.0 3.7 - 4.7 g/dL   Globulin, Total 2.6 1.5 - 4.5 g/dL   Albumin/Globulin Ratio 1.5 1.2 - 2.2   Bilirubin Total <0.2 0.0 - 1.2 mg/dL   Alkaline Phosphatase 90 48 - 121 IU/L   AST 15 0 - 40 IU/L   ALT 6 0 - 32 IU/L  TSH     Status: None   Collection Time: 06/28/20  3:46 PM  Result Value Ref Range   TSH 1.330 0.450 - 4.500 uIU/mL  Lipase     Status: Abnormal   Collection Time: 06/28/20  3:46 PM  Result Value Ref Range   Lipase 12 (L) 14 - 85 U/L    Radiology: CT ANGIO HEAD W OR WO CONTRAST  Result Date: 07/14/2020 CLINICAL DATA:  Follow-up cerebral aneurysm. EXAM: CT ANGIOGRAPHY HEAD TECHNIQUE: Multidetector CT imaging of the head was performed using the standard protocol during bolus administration of intravenous contrast. Multiplanar CT image reconstructions and MIPs were obtained to evaluate the vascular anatomy. CONTRAST:  84m ISOVUE-370 IOPAMIDOL (ISOVUE-370) INJECTION 76% COMPARISON:  07/10/2019 FINDINGS: CT HEAD Brain: There is no evidence of an acute infarct, intracranial hemorrhage, mass, midline shift, or extra-axial fluid collection. The ventricles and sulci are  within normal limits for age. Vascular: Calcified atherosclerosis at the skull base. No hyperdense vessel. Skull: Unchanged indeterminate 4 cm region of decreased density laterally and inferiorly in the left parietal skull without expansion or cortical disruption. No new or progressive finding. Sinuses: The visualized paranasal sinuses and mastoid air cells are clear. Orbits: Unchanged focal  depression of the right lamina papyracea. Left cataract extraction. CTA HEAD Anterior circulation: The internal carotid arteries are patent from skull base to carotid termini with mild calcified plaque bilaterally not resulting in significant stenosis. A 3 mm aneurysm projecting posteriorly from the right posterior communicating artery origin and a 3 mm superiorly projecting left paraophthalmic ICA aneurysm are both unchanged. ACAs and MCAs are patent without evidence of a proximal branch occlusion or significant proximal stenosis. Posterior circulation: The visualized distal vertebral arteries are widely patent to the basilar and codominant. Patent PICA, AICA, and SCA origins are identified bilaterally. The basilar artery is widely patent. There are right larger than left posterior communicating arteries. Both PCAs are patent without evidence of a significant proximal stenosis. No aneurysm is identified. Venous sinuses: Patent. Anatomic variants: None. IMPRESSION: 1. 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 2. Mild intracranial atherosclerosis without medium or large vessel occlusion or significant stenosis. Electronically Signed   By: Logan Bores M.D.   On: 07/14/2020 13:35    No results found.  CT ANGIO HEAD W OR WO CONTRAST  Result Date: 07/14/2020 CLINICAL DATA:  Follow-up cerebral aneurysm. EXAM: CT ANGIOGRAPHY HEAD TECHNIQUE: Multidetector CT imaging of the head was performed using the standard protocol during bolus administration of intravenous contrast. Multiplanar CT image reconstructions and MIPs were obtained to evaluate the vascular anatomy. CONTRAST:  53m ISOVUE-370 IOPAMIDOL (ISOVUE-370) INJECTION 76% COMPARISON:  07/10/2019 FINDINGS: CT HEAD Brain: There is no evidence of an acute infarct, intracranial hemorrhage, mass, midline shift, or extra-axial fluid collection. The ventricles and sulci are within normal limits for age. Vascular: Calcified  atherosclerosis at the skull base. No hyperdense vessel. Skull: Unchanged indeterminate 4 cm region of decreased density laterally and inferiorly in the left parietal skull without expansion or cortical disruption. No new or progressive finding. Sinuses: The visualized paranasal sinuses and mastoid air cells are clear. Orbits: Unchanged focal depression of the right lamina papyracea. Left cataract extraction. CTA HEAD Anterior circulation: The internal carotid arteries are patent from skull base to carotid termini with mild calcified plaque bilaterally not resulting in significant stenosis. A 3 mm aneurysm projecting posteriorly from the right posterior communicating artery origin and a 3 mm superiorly projecting left paraophthalmic ICA aneurysm are both unchanged. ACAs and MCAs are patent without evidence of a proximal branch occlusion or significant proximal stenosis. Posterior circulation: The visualized distal vertebral arteries are widely patent to the basilar and codominant. Patent PICA, AICA, and SCA origins are identified bilaterally. The basilar artery is widely patent. There are right larger than left posterior communicating arteries. Both PCAs are patent without evidence of a significant proximal stenosis. No aneurysm is identified. Venous sinuses: Patent. Anatomic variants: None. IMPRESSION: 1. 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 2. Mild intracranial atherosclerosis without medium or large vessel occlusion or significant stenosis. Electronically Signed   By: ALogan BoresM.D.   On: 07/14/2020 13:35      Assessment and Plan: Patient Active Problem List   Diagnosis Date Noted  . Depression, major, single episode, complete remission ([place] 05/20/2020  . Incarcerated hernia  of abdominal cavity 08/04/2018  . SBO (small bowel obstruction) ([place]) 04/22/2018  . Incarcerated hernia 04/14/2018  . Aneurysm ([place]) 07/10/2016  . Nonspecific abnormal  finding 05/11/2015  . Allergic rhinitis 05/11/2015  . Anxiety 05/11/2015  . Bronchitis, chronic ([place]) 05/11/2015  . CCF (congestive cardiac failure) ([place]) 05/11/2015  . Clinical depression 05/11/2015  . 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 Essential (primary) hypertension 05/11/2015  . Acid reflux 05/11/2015  . HLD (hyperlipidemia) 05/11/2015  . Adult hypothyroidism 05/11/2015  . Cervical dysplasia, mild 05/11/2015  . Neuropathy 05/11/2015  . Adiposity 05/11/2015  . Obstructive apnea 05/11/2015  . Arthritis, degenerative 05/11/2015    1. Chronic respiratory failure with hypoxia (HCC) Currently tolerating her supplemental oxygen, continue at current settings, adjust oxygen levels as needed during activity. At next follow-up would like to discuss NIV therapy to assist with symptom management as well as decreasing risk of hospitalizations.  2. Chronic obstructive pulmonary disease, unspecified COPD type ([place]) Will obtain PFT to assess for disease progression, at this time continue with current therapy, will continue routine monitoring. - Pulmonary function test; Future  3. Congestive heart failure, unspecified HF chronicity, unspecified heart failure type ([place]) Continue with current therapy and monitoring from cardiology.  4. SOB (shortness of breath) Stable at this time. - Spirometry with Graph  General Counseling: I have discussed the findings of the evaluation and examination with Dasha.  I have also discussed any further diagnostic evaluation thatmay be needed or ordered today. Kajah verbalizes understanding of the findings of todays visit. We also reviewed her medications today and discussed drug interactions and side effects including but not limited excessive drowsiness and altered mental states. We also discussed that there is always a risk not just to her but also people around her. she has been encouraged to call the office with any questions or concerns  that should arise related to todays visit.  Orders Placed This Encounter  Procedures  . Spirometry with Graph    Order Specific Question:   Where should this test be performed?    Answer:   [facility]    Order Specific Question:   Basic spirometry    Answer:   Yes  . Pulmonary function test    Standing Status:   Future    Standing Expiration Date:   07/19/2021    Order Specific Question:   Where should this test be performed?    Answer:   [facility]     Time spent: 54  I have personally obtained a history, examined the patient, evaluated laboratory and imaging results, formulated the assessment and plan and placed orders. This patient was seen by Casey Burkitt AGNP-C in Collaboration with Dr. Devona Konig as a part of collaborative care agreement.    Allyne Gee, MD [facility] Pulmonary and Critical Care Sleep medicine

## 2020-07-20 ENCOUNTER — Encounter: Payer: Self-pay | Admitting: Internal Medicine

## 2020-07-20 NOTE — Telephone Encounter (Signed)
I will ask Dr. Sullivan Lone or Marcelino Duster if they have seen anything for this patient.

## 2020-07-20 NOTE — Patient Instructions (Signed)
Chronic Respiratory Failure  Respiratory failure is a condition in which the lungs do not work well and the breathing (respiratory) system fails. When respiratory failure occurs, it becomes difficult for the lungs to get enough oxygen or to eliminate carbon dioxide or to do both duties. If the lungs do not work properly, the heart, brain, and other body systems do not get enough oxygen. Respiratory failure is life-threatening if it is not treated. Respiratory failure can be acute or chronic. Acute respiratory failure is sudden and severe and requires emergency medical treatment. Chronic respiratory failure happens over time, usually due to a medical condition that gets worse. What are the causes? This condition may be caused by any problem that affects the heart or lungs. Causes include:  Chronic bronchitis and emphysema (COPD).  Pulmonary fibrosis.  Water in the lungs due to heart failure, lung injury, or infection (pulmonary edema).  Asthma.  Nerve or muscle diseases that make chest movements difficult, such as Lou Gehrig disease or Guillain-Barre syndrome.  A collapsed lung (pneumothorax).  Pulmonary hypertension.  Chronic sleep apnea.  Pneumonia.  Obesity.  A blood clot in a lung (pulmonary embolism).  Trauma to the chest that makes breathing difficult. What increases the risk? You are more likely to develop this condition if:  You are a smoker, or have a history of smoking.  You have a weak immune system.  You have a family history of breathing problems or lung disease.  You have a long term lung disease such as COPD. What are the signs or symptoms? Symptoms of this condition include:  Shortness of breath with or without activity.  Difficulty breathing.  Wheezing.  A fast or irregular heartbeat (arrhythmia).  Chest pain or tightness.  A bluish color to the fingernail or toenail beds (cyanosis).  Confusion.  Drowsiness.  Extreme fatigue, especially with  minimal activity. How is this diagnosed? This condition may be diagnosed based on:  Your medical history.  A physical exam.  Other tests, such as: ? A chest X-ray. ? A CT scan of your lungs. ? Blood tests, such as an arterial blood gas test. This test is done to check if you have enough oxygen in your blood. ? An electrocardiogram. This test records the electrical activity of your heart. ? An echocardiogram. This test uses sound waves to produce an image of your heart.  A check of your blood pressure, heart rate, breathing rate, and blood oxygen level. How is this treated? Treatment for this condition depends on the cause. Treatment can include the following:  Getting oxygen through a nasal cannula. This is a tube that goes in your nose.  Getting oxygen through a face mask.  Receiving noninvasive positive pressure ventilation. This is a method of breathing support in which a machine blows air into your lungs through a mask. The machine allows you to breathe on your own. It helps the body take in oxygen and eliminate carbon dioxide.  Using a ventilator. This is a breathing machine that delivers oxygen to the lungs through a breathing tube that is put into the trachea. This machine is used when you can no longer breathe well enough on your own.  Medicines to help with breathing, such as: ? Medicines that open up and relax air passages, such as bronchodilators. These may be given through a device that turns liquid medicines into a mist you can breathe in (nebulizer). These medicines help with breathing. ? Diuretics. These medicines get rid of extra fluid out   of your lungs, which can help you breathe better. ? Steroid medicines. These decrease inflammation in the lungs. ? Antibiotic medicines. These may be given to treat a bacterial infection, such as pneumonia.  Pulmonary rehabilitation. This is an exercise program that strengthens the muscles in your chest and helps you learn breathing  techniques in order to manage your condition. Follow these instructions at home: Medicines  Take over-the-counter and prescription medicines only as told by your health care provider.  If you were prescribed an antibiotic medicine, take it as told by your health care provider. Do not stop taking the antibiotic even if you start to feel better. General instructions  Use oxygen therapy and pulmonary rehabilitation if directed to by your health care provider. If you require home oxygen therapy, ask your health care provider whether you should purchase a pulse oximeter to measure your oxygen level at home.  Work with your health care provider to create a plan to help you deal with your condition. Follow this plan.  Do not use any products that contain nicotine or tobacco, such as cigarettes and e-cigarettes. If you need help quitting, ask your health care provider.  Avoid exposure to irritants that make your breathing problems worse. These include smoke, chemicals, and fumes.  Stay active, but balance activity with periods of rest. Exercise and physical activity will help you maintain your ability to do things you want to do.  Stay up to date on all vaccines, especially yearly influenza and pneumonia vaccines.  Avoid people who are sick as well as crowded places during the flu season.  Keep all follow-up visits as told by your health care provider. This is important. Contact a health care provider if:  Your shortness of breath gets worse and you cannot do the things you used to do.  You have increased mucus (sputum), wheezing, coughing, or loss of energy.  You are on oxygen therapy and you are starting to need more.  You need to use your medicines more often.  You have a fever. Get help right away if:  Your shortness of breath becomes worse.  You are unable to say more than a few words without having to catch your breath.  You develop chest pain or  tightness. Summary  Respiratory failure is a condition in which the lungs do not work well and the breathing system fails.  This condition can be very serious and is often life-threatening.  This condition is diagnosed with tests and can be treated with medicines or oxygen.  Contact a health care provider if your shortness of breath gets worse or if you need to use your oxygen or medicines more often than before. This information is not intended to replace advice given to you by your health care provider. Make sure you discuss any questions you have with your health care provider. Document Revised: 10/04/2017 Document Reviewed: 11/02/2016 Elsevier Patient Education  2020 Elsevier Inc.  

## 2020-07-21 ENCOUNTER — Telehealth: Payer: Self-pay

## 2020-07-21 DIAGNOSIS — R059 Cough, unspecified: Secondary | ICD-10-CM

## 2020-07-21 MED ORDER — AZITHROMYCIN 250 MG PO TABS: ORAL_TABLET | ORAL | 1 refills | Status: DC

## 2020-07-21 NOTE — Telephone Encounter (Signed)
Returned patient's call, she was concerned about some coughing that she was having. Advised patient to try robitussin that she was advised to take last time for her cough, so she said she would try that and if it did not work she would give [place] a call.

## 2020-07-21 NOTE — Addendum Note (Signed)
Addended by: Aleene Davidson on: 07/21/2020 02:53 PM   Modules accepted: Orders

## 2020-07-21 NOTE — Telephone Encounter (Signed)
Copied from CRM [id number]. Topic: General - Inquiry >> Jul 21, 2020  1:10 PM Leary Roca wrote: Reason for CRM: Pt is needing a call back from office . Pt didn't state reason . Please advise

## 2020-07-21 NOTE — Telephone Encounter (Signed)
Medication sent to patient's pharmacy.

## 2020-07-26 ENCOUNTER — Ambulatory Visit: Payer: Self-pay | Admitting: Family Medicine

## 2020-07-26 DIAGNOSIS — R0602 Shortness of breath: Secondary | ICD-10-CM | POA: Diagnosis not present

## 2020-07-26 DIAGNOSIS — E7849 Other hyperlipidemia: Secondary | ICD-10-CM | POA: Diagnosis not present

## 2020-07-26 DIAGNOSIS — J8410 Pulmonary fibrosis, unspecified: Secondary | ICD-10-CM | POA: Diagnosis not present

## 2020-07-26 DIAGNOSIS — R0902 Hypoxemia: Secondary | ICD-10-CM | POA: Diagnosis not present

## 2020-07-26 DIAGNOSIS — I10 Essential (primary) hypertension: Secondary | ICD-10-CM | POA: Diagnosis not present

## 2020-07-26 NOTE — Progress Notes (Signed)
Trena Platt Aurianna Earlywine,acting as a scribe for Wilhemena Durie, MD.,have documented all relevant documentation on the behalf of Wilhemena Durie, MD,as directed by  Wilhemena Durie, MD while in the presence of Wilhemena Durie, MD.  Established patient visit   Patient: Kristen Schroeder   DOB: [birth year]   79 y.o. Female  MRN: [id number] Visit Date: 07/27/2020  Today's healthcare provider: Wilhemena Durie, MD   Chief Complaint  Patient presents with  . Weight Loss  . Nausea   Subjective    HPI   Patient presents for a 4 week follow up on Weight loss & nausea. Patient says she is feeling a lot better and is not having any more nausea.  Energy level is also better with her nausea improved. Weight loss From 06/28/2020-Will obtain labs but I think the main issue is failure to thrive secondary to the pulmonary hypertension and pulmonary fibrosis.  Nausea From 06/28/2020-given rx for ondansetron 8 mg.      Medications: Outpatient Medications Prior to Visit  Medication Sig  . arformoterol (BROVANA) 15 MCG/2ML NEBU Take 2 mLs (15 mcg total) by nebulization 2 (two) times daily.  Marland Kitchen aspirin EC 81 MG tablet Take 81 mg by mouth daily.  Marland Kitchen azithromycin (ZITHROMAX) 250 MG tablet Take 2 tablets by mouth day 1, then take 1 tablet by mouth the rest of the days.  . blood glucose meter kit and supplies KIT Dispense based on patient and insurance preference. Use up to four times daily as directed. (FOR ICD-9 250.00, 250.01).  . Blood Glucose Monitoring Suppl (ONE TOUCH ULTRA 2) w/Device KIT 1 each by Does not apply route daily.  . enalapril (VASOTEC) 20 MG tablet TAKE 1 TABLET BY MOUTH EVERY DAY  . fluticasone (FLONASE) 50 MCG/ACT nasal spray Place 1 spray into both nostrils daily as needed.   . furosemide (LASIX) 20 MG tablet Take 2 tablets (40 mg total) by mouth daily.  Marland Kitchen gabapentin (NEURONTIN) 300 MG capsule TAKE 2 CAPSULES IN THE MORNING, 1 CAPSULE IN THE AFTERNOON AND 2 CAPSULES IN  THE EVENING  . ipratropium-albuterol (DUONEB) 0.5-2.5 (3) MG/3ML SOLN TAKE 3 MLS BY NEBULIZATION EVERY 6 (SIX) HOURS AS NEEDED.  Marland Kitchen isosorbide mononitrate (IMDUR) 30 MG 24 hr tablet Take 30 mg by mouth daily as needed (high bp).  Marland Kitchen levothyroxine (SYNTHROID) 125 MCG tablet TAKE 1 TABLET BY MOUTH DAILY  . lisinopril (ZESTRIL) 20 MG tablet Take by mouth.  . loratadine (CLARITIN) 10 MG tablet Take 1 tablet (10 mg total) by mouth daily.  Marland Kitchen LORazepam (ATIVAN) 0.5 MG tablet Take 1 tablet (0.5 mg total) by mouth 2 (two) times daily as needed for anxiety.  . meclizine (ANTIVERT) 12.5 MG tablet Take 1 tablet (12.5 mg total) by mouth 3 (three) times daily as needed for dizziness.  . mometasone-formoterol (DULERA) 100-5 MCG/ACT AERO Inhale 2 puffs into the lungs 2 (two) times daily.  . MULTIPLE VITAMIN PO Take 1 tablet by mouth daily.   Marland Kitchen omeprazole (PRILOSEC) 40 MG capsule Take 1 capsule (40 mg total) by mouth daily.  Glory Rosebush ULTRA test strip USE 1 STRIP TO TEST GLUCOSE TWICE DAILY AS NEEDED  . OXYGEN Inhale 4 L into the lungs.  Marland Kitchen PARoxetine (PAXIL) 10 MG tablet Take 1 tablet (10 mg total) by mouth at bedtime.  . predniSONE (DELTASONE) 20 MG tablet Take 1 tablet (20 mg total) by mouth daily with breakfast.  . roflumilast (DALIRESP) 500 MCG TABS tablet Take  500 mcg by mouth daily. As needed  . sertraline (ZOLOFT) 50 MG tablet Take 1 tablet (50 mg total) by mouth daily.  Marland Kitchen tiotropium (SPIRIVA) 18 MCG inhalation capsule Place into inhaler and inhale. (Patient not taking: Reported on 07/19/2020)   No facility-administered medications prior to visit.    Review of Systems  Constitutional: Negative for appetite change, chills, fatigue and fever.  Respiratory: Negative for chest tightness and shortness of breath.   Cardiovascular: Negative for chest pain and palpitations.  Gastrointestinal: Negative for abdominal pain, nausea and vomiting.  Neurological: Negative for dizziness and weakness.        Objective    BP (!) 143/79 (BP Location: Left Arm, Patient Position: Sitting, Cuff Size: Large)   Pulse 84   Temp 98.4 F (36.9 C) (Oral)   Ht '5\' 7"'  (1.702 m)   Wt 213 lb 3.2 oz (96.7 kg)   BMI 33.39 kg/m  BP Readings from Last 3 Encounters:  07/27/20 (!) 143/79  07/19/20 (!) 167/64  06/28/20 (!) 155/87   Wt Readings from Last 3 Encounters:  07/27/20 213 lb 3.2 oz (96.7 kg)  06/28/20 208 lb 12.8 oz (94.7 kg)  06/04/20 (!) 216 lb 0.8 oz (98 kg)      Physical Exam Vitals and nursing note reviewed.  Constitutional:      General: She is not in acute distress.    Appearance: Normal appearance. She is not toxic-appearing.  HENT:     Head: Normocephalic and atraumatic.     Right Ear: External ear normal.     Left Ear: External ear normal.     Nose: Nose normal.     Mouth/Throat:     Mouth: Mucous membranes are moist.     Pharynx: Oropharynx is clear.  Eyes:     General: No scleral icterus.    Conjunctiva/sclera: Conjunctivae normal.  Cardiovascular:     Rate and Rhythm: Normal rate and regular rhythm.     Pulses: Normal pulses.     Heart sounds: Normal heart sounds.  Pulmonary:     Effort: Pulmonary effort is normal.     Breath sounds: Normal breath sounds.  Abdominal:     Palpations: Abdomen is soft.     Tenderness: There is no abdominal tenderness.  Musculoskeletal:     Cervical back: Normal range of motion and neck supple.     Comments: Trace lower extremities ankle and pedal edema  Skin:    General: Skin is warm and dry.  Neurological:     General: No focal deficit present.     Mental Status: She is alert and oriented to person, place, and time.  Psychiatric:        Mood and Affect: Mood normal.        Behavior: Behavior normal.        Thought Content: Thought content normal.        Judgment: Judgment normal.       No results found for any visits on 07/27/20.  Assessment & Plan     1. Weight loss Improving with 4 pound weight gain since  her nausea  has improved  2. Nausea Resolved.  3. Need for influenza vaccination  - Flu Vaccine QUAD High Dose(Fluad)  4. Congestive heart failure, unspecified HF chronicity, unspecified heart failure type ([place])   5. Essential (primary) hypertension   6. Adult hypothyroidism   7. Other hyperlipidemia  8.COPD  9.  Pulmonary hypertension  Return in about 3 months (around 10/26/2020).  I, Wilhemena Durie, MD, have reviewed all documentation for this visit. The documentation on 07/30/20 for the exam, diagnosis, procedures, and orders are all accurate and complete.    Richard Cranford Mon, MD  [facility] [phone] (phone) [phone] (fax)  [place]

## 2020-07-27 ENCOUNTER — Other Ambulatory Visit: Payer: Self-pay

## 2020-07-27 ENCOUNTER — Ambulatory Visit (INDEPENDENT_AMBULATORY_CARE_PROVIDER_SITE_OTHER): Payer: Medicare Other | Admitting: Family Medicine

## 2020-07-27 ENCOUNTER — Encounter: Payer: Self-pay | Admitting: Family Medicine

## 2020-07-27 VITALS — BP 143/79 | HR 84 | Temp 98.4°F | Ht 67.0 in | Wt 213.2 lb

## 2020-07-27 DIAGNOSIS — E039 Hypothyroidism, unspecified: Secondary | ICD-10-CM

## 2020-07-27 DIAGNOSIS — Z23 Encounter for immunization: Secondary | ICD-10-CM

## 2020-07-27 DIAGNOSIS — R634 Abnormal weight loss: Secondary | ICD-10-CM | POA: Diagnosis not present

## 2020-07-27 DIAGNOSIS — I10 Essential (primary) hypertension: Secondary | ICD-10-CM | POA: Diagnosis not present

## 2020-07-27 DIAGNOSIS — E7849 Other hyperlipidemia: Secondary | ICD-10-CM

## 2020-07-27 DIAGNOSIS — I509 Heart failure, unspecified: Secondary | ICD-10-CM

## 2020-07-27 DIAGNOSIS — R110 Nausea: Secondary | ICD-10-CM

## 2020-08-02 DIAGNOSIS — J449 Chronic obstructive pulmonary disease, unspecified: Secondary | ICD-10-CM | POA: Diagnosis not present

## 2020-08-03 NOTE — Telephone Encounter (Signed)
This was completed and faxed back.

## 2020-08-05 ENCOUNTER — Telehealth: Payer: Self-pay

## 2020-08-05 NOTE — Telephone Encounter (Signed)
Copied from CRM [id number]. Topic: General - Other >> Aug 05, 2020 11:02 AM Dalphine Handing A wrote: [employer] called back and stated that still have not received the fax. Fax number [phone]

## 2020-08-08 ENCOUNTER — Ambulatory Visit (INDEPENDENT_AMBULATORY_CARE_PROVIDER_SITE_OTHER): Payer: Medicare Other | Admitting: Family Medicine

## 2020-08-08 DIAGNOSIS — R350 Frequency of micturition: Secondary | ICD-10-CM

## 2020-08-08 NOTE — Progress Notes (Signed)
Patient wished to return phone call as she felt bad 3 days after her flu shot and then felt badly for another day with urinary frequency.  This was all a week ago and it is completely resolved now.  She feels well.  No intervention or orders at this time.

## 2020-08-09 NOTE — Telephone Encounter (Signed)
I have not seen any papers from them that I know of.  I would remember closes quite lengthy paperwork.

## 2020-08-09 NOTE — Telephone Encounter (Signed)
Please review. Thanks!  

## 2020-08-11 ENCOUNTER — Other Ambulatory Visit: Payer: Self-pay | Admitting: Adult Health

## 2020-08-14 DIAGNOSIS — J449 Chronic obstructive pulmonary disease, unspecified: Secondary | ICD-10-CM | POA: Diagnosis not present

## 2020-08-15 ENCOUNTER — Telehealth: Payer: Self-pay

## 2020-08-15 NOTE — Telephone Encounter (Signed)
Confirmed and screened for 08-17-20 ov. 

## 2020-08-15 NOTE — Telephone Encounter (Signed)
Lmom to confirm and screen for 08-17-20 ov. 

## 2020-08-17 ENCOUNTER — Ambulatory Visit: Payer: Medicare Other | Admitting: Internal Medicine

## 2020-08-17 ENCOUNTER — Other Ambulatory Visit: Payer: Self-pay

## 2020-08-17 DIAGNOSIS — R0602 Shortness of breath: Secondary | ICD-10-CM | POA: Diagnosis not present

## 2020-08-17 DIAGNOSIS — J449 Chronic obstructive pulmonary disease, unspecified: Secondary | ICD-10-CM

## 2020-08-17 LAB — PULMONARY FUNCTION TEST

## 2020-08-19 NOTE — Telephone Encounter (Addendum)
Kristen Schroeder is calling checking on the status of [employer]over round paperwork. He will refax the form

## 2020-08-19 NOTE — Procedures (Signed)
[facility] [address] [place], [address]  DATE OF SERVICE: August 17, 2020  Complete Pulmonary Function Testing Interpretation:  FINDINGS:  Forced vital capacity is moderately decreased.  FEV1 is 0.68 L which is 39% predicted and is severely decreased. FVC ratio is severely decreased.  Postbronchodilator no significant change in the FEV1.  Clinical improvement may occur in the absence spirometric improvement.  Total lung capacity is increased residual volume is increased residual on the lung capacity ratio is increased.  FRC was increased.  DLCO maneuver was not able to be done  IMPRESSION:  This pulmonary function study is consistent with severe obstructive lung disease.  There was no significant response to bronchodilators clinical correlation is recommended.  Yevonne Pax, MD [facility] Medicine Sleep Medicine

## 2020-08-26 ENCOUNTER — Other Ambulatory Visit: Payer: Self-pay | Admitting: Family Medicine

## 2020-09-01 DIAGNOSIS — J449 Chronic obstructive pulmonary disease, unspecified: Secondary | ICD-10-CM | POA: Diagnosis not present

## 2020-09-14 DIAGNOSIS — J449 Chronic obstructive pulmonary disease, unspecified: Secondary | ICD-10-CM | POA: Diagnosis not present

## 2020-09-22 ENCOUNTER — Telehealth: Payer: Self-pay

## 2020-09-22 NOTE — Telephone Encounter (Signed)
Yes, we have it.  Working on it to fax back for next week

## 2020-09-22 NOTE — Telephone Encounter (Signed)
Copied from CRM [id number]. Topic: General - Other >> Sep 22, 2020 11:29 AM Randol Kern wrote: Best contact:[phone]  Bjorn Loser, calling from [facility] wants a call back to confirm if fax has been received at office. Please advise

## 2020-09-26 ENCOUNTER — Other Ambulatory Visit: Payer: Self-pay | Admitting: Family Medicine

## 2020-09-26 DIAGNOSIS — R059 Cough, unspecified: Secondary | ICD-10-CM

## 2020-09-26 NOTE — Telephone Encounter (Signed)
Requested medication (s) are due for refill today: yes  Requested medication (s) are on the active medication list: yes  Last refill:  07/21/20 #6 1 refill  Future visit scheduled: yes  Notes to clinic:  med not assigned to a protocol, do you want to refill Rx?     Requested Prescriptions  Pending Prescriptions Disp Refills   azithromycin (ZITHROMAX) 250 MG tablet [Pharmacy Med Name: AZITHROMYCIN 250 MG TABLET] 6 tablet 1    Sig: TAKE 2 TABLETS BY MOUTH TODAY, THEN TAKE 1 TABLET DAILY FOR 4 DAYS      Off-Protocol Failed - 09/26/2020  1:16 PM      Failed - Medication not assigned to a protocol, review manually.      Passed - Valid encounter within last 12 months    Recent Outpatient Visits           1 month ago Urinary frequency   [facility] Maple Hudson., MD   2 months ago Weight loss   [facility] Maple Hudson., MD   3 months ago Prediabetes   [facility] Maple Hudson., MD   4 months ago Pulmonary fibrosis [facility])   [facility] Maple Hudson., MD   5 months ago Essential (primary) hypertension   [facility] Maple Hudson., MD       Future Appointments             In 1 month Maple Hudson., MD [facility]

## 2020-10-02 DIAGNOSIS — J449 Chronic obstructive pulmonary disease, unspecified: Secondary | ICD-10-CM | POA: Diagnosis not present

## 2020-10-05 ENCOUNTER — Other Ambulatory Visit: Payer: Self-pay | Admitting: Family Medicine

## 2020-10-05 DIAGNOSIS — R059 Cough, unspecified: Secondary | ICD-10-CM

## 2020-10-05 IMAGING — CT CT ANGIO HEAD
1 of 4 series · 4 of 30 positions shown · IV contrast (APPLIED)
Comparison: 07/10/2019

CLINICAL DATA: Follow-up cerebral aneurysm.

EXAM:
CT ANGIOGRAPHY HEAD
TECHNIQUE: Multidetector CT imaging of the head was performed using the
standard protocol during bolus administration of intravenous
contrast. Multiplanar CT image reconstructions and MIPs were
obtained to evaluate the vascular anatomy.
CONTRAST:  75mL BVTJDC-C0S IOPAMIDOL (BVTJDC-C0S) INJECTION 76%

[Series 9: head angio · axial · 0.40mm/px · z∈[+320,+416]mm · 4 of 54 slices shown]
[im 11/54  brain]
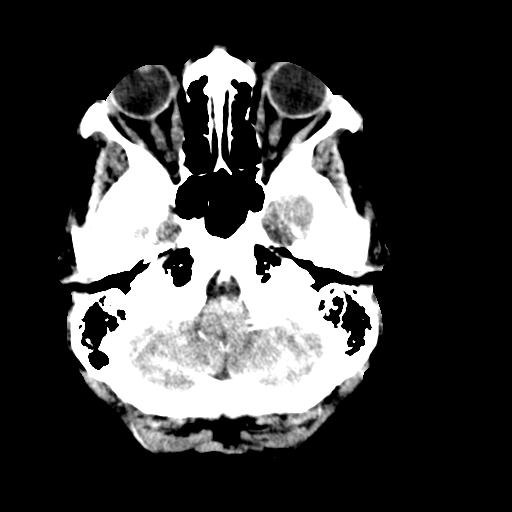
[im 22/54  bone]
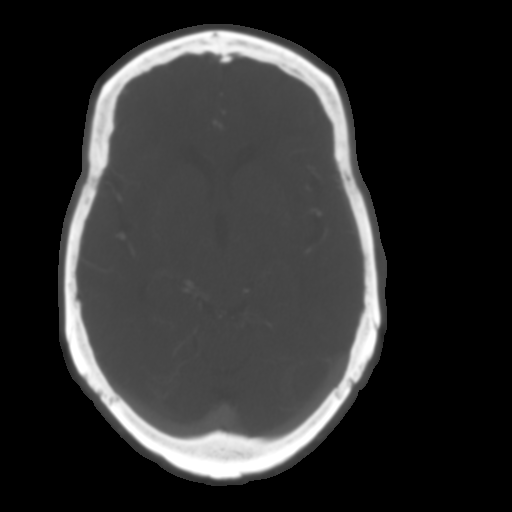
[im 32/54  brain]
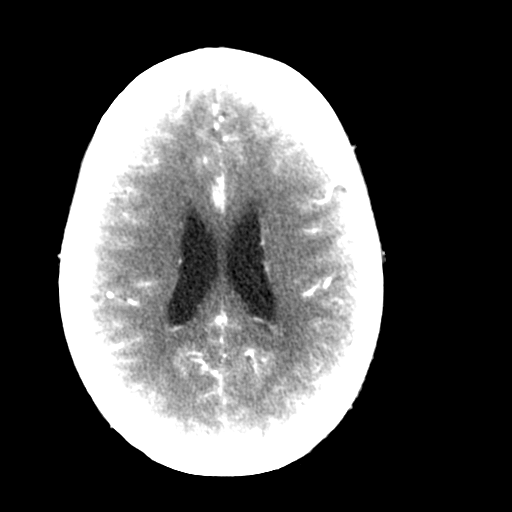
[im 43/54  bone]
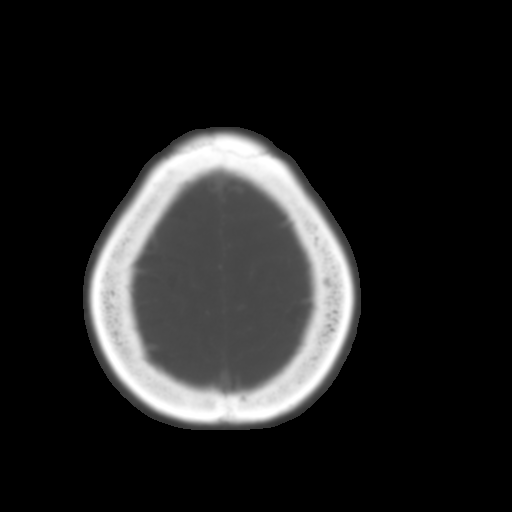

[4 of 30 positions shown; findings below may reference images not displayed]

FINDINGS: CT HEAD

Brain: There is no evidence of an acute infarct, intracranial
hemorrhage, mass, midline shift, or extra-axial fluid collection.
The ventricles and sulci are within normal limits for age.

Vascular: Calcified atherosclerosis at the skull base. No hyperdense
vessel.

Skull: Unchanged indeterminate 4 cm region of decreased density
laterally and inferiorly in the left parietal skull without
expansion or cortical disruption. No new or progressive finding.

Sinuses: The visualized paranasal sinuses and mastoid air cells are
clear.

Orbits: Unchanged focal depression of the right lamina papyracea.
Left cataract extraction.

CTA HEAD

Anterior circulation: The internal carotid arteries are patent from
skull base to carotid termini with mild calcified plaque bilaterally
not resulting in significant stenosis. A 3 mm aneurysm projecting
posteriorly from the right posterior communicating artery origin and
a 3 mm superiorly projecting left paraophthalmic ICA aneurysm are
both unchanged. ACAs and MCAs are patent without evidence of a
proximal branch occlusion or significant proximal stenosis.

Posterior circulation: The visualized distal vertebral arteries are
widely patent to the basilar and codominant. Patent PICA, AICA, and
SCA origins are identified bilaterally. The basilar artery is widely
patent. There are right larger than left posterior communicating
arteries. Both PCAs are patent without evidence of a significant
proximal stenosis. No aneurysm is identified.

Venous sinuses: Patent.

Anatomic variants: None.
IMPRESSION: 1. Unchanged 3 mm right posterior communicating and 3 mm left
paraophthalmic ICA aneurysms.
2. Mild intracranial atherosclerosis without medium or large vessel
occlusion or significant stenosis.

## 2020-10-05 NOTE — Telephone Encounter (Signed)
°  Notes to clinic:  Patient has appointment on 10/27/2020!!  Medication not assigned to a protocol    Requested Prescriptions  Pending Prescriptions Disp Refills   azithromycin (ZITHROMAX) 250 MG tablet [Pharmacy Med Name: AZITHROMYCIN 250 MG TABLET] 6 tablet 1    Sig: TAKE 2 TABLETS BY MOUTH TODAY, THEN TAKE 1 TABLET DAILY FOR 4 DAYS      Off-Protocol Failed - 10/05/2020  9:46 AM      Failed - Medication not assigned to a protocol, review manually.      Passed - Valid encounter within last 12 months    Recent Outpatient Visits           1 month ago Urinary frequency   [facility] Maple Hudson., MD   2 months ago Weight loss   [facility] Maple Hudson., MD   3 months ago Prediabetes   [facility] Maple Hudson., MD   4 months ago Pulmonary fibrosis [facility])   [facility] Maple Hudson., MD   5 months ago Essential (primary) hypertension   [facility] Maple Hudson., MD       Future Appointments             In 3 weeks Maple Hudson., MD [facility], [facility]

## 2020-10-07 ENCOUNTER — Other Ambulatory Visit: Payer: Self-pay | Admitting: Family Medicine

## 2020-10-07 DIAGNOSIS — K219 Gastro-esophageal reflux disease without esophagitis: Secondary | ICD-10-CM

## 2020-10-12 NOTE — Telephone Encounter (Addendum)
Bjorn Loser is calling checking on the status of hov around fax

## 2020-10-14 DIAGNOSIS — J449 Chronic obstructive pulmonary disease, unspecified: Secondary | ICD-10-CM | POA: Diagnosis not present

## 2020-10-24 ENCOUNTER — Telehealth: Payer: Self-pay

## 2020-10-24 ENCOUNTER — Other Ambulatory Visit: Payer: Self-pay | Admitting: Internal Medicine

## 2020-10-24 NOTE — Telephone Encounter (Signed)
Copied from CRM [id number]. Topic: General - Other >> Oct 24, 2020  2:01 PM Dalphine Handing A wrote: Patient is wanting covid booster during upcoming appt on 12/23

## 2020-10-24 NOTE — Telephone Encounter (Signed)
Noted  

## 2020-10-25 ENCOUNTER — Telehealth: Payer: Self-pay

## 2020-10-25 NOTE — Telephone Encounter (Signed)
Per Dr. Sullivan Lone this is complete.

## 2020-10-25 NOTE — Telephone Encounter (Signed)
Copied from CRM [id number]. Topic: General - Other >> Oct 25, 2020 11:44 AM Gwenlyn Fudge wrote: Reason for CRM: Lawanna Kobus, from [facility], calling to check on the addendum to the pts chart notes and is requesting to have an update. [place] advise.

## 2020-10-27 ENCOUNTER — Ambulatory Visit (INDEPENDENT_AMBULATORY_CARE_PROVIDER_SITE_OTHER): Payer: Medicare Other | Admitting: Family Medicine

## 2020-10-27 ENCOUNTER — Ambulatory Visit: Payer: Self-pay | Admitting: Family Medicine

## 2020-10-27 ENCOUNTER — Encounter: Payer: Self-pay | Admitting: Family Medicine

## 2020-10-27 ENCOUNTER — Other Ambulatory Visit: Payer: Self-pay

## 2020-10-27 VITALS — BP 135/90 | HR 79 | Wt 204.0 lb

## 2020-10-27 DIAGNOSIS — E6609 Other obesity due to excess calories: Secondary | ICD-10-CM

## 2020-10-27 DIAGNOSIS — I10 Essential (primary) hypertension: Secondary | ICD-10-CM | POA: Diagnosis not present

## 2020-10-27 DIAGNOSIS — Z6833 Body mass index (BMI) 33.0-33.9, adult: Secondary | ICD-10-CM

## 2020-10-27 DIAGNOSIS — G4733 Obstructive sleep apnea (adult) (pediatric): Secondary | ICD-10-CM

## 2020-10-27 DIAGNOSIS — Z23 Encounter for immunization: Secondary | ICD-10-CM

## 2020-10-27 DIAGNOSIS — J9612 Chronic respiratory failure with hypercapnia: Secondary | ICD-10-CM

## 2020-10-27 DIAGNOSIS — J8410 Pulmonary fibrosis, unspecified: Secondary | ICD-10-CM

## 2020-10-27 DIAGNOSIS — E039 Hypothyroidism, unspecified: Secondary | ICD-10-CM

## 2020-10-27 DIAGNOSIS — J9611 Chronic respiratory failure with hypoxia: Secondary | ICD-10-CM

## 2020-10-27 DIAGNOSIS — M5137 Other intervertebral disc degeneration, lumbosacral region: Secondary | ICD-10-CM | POA: Diagnosis not present

## 2020-10-27 DIAGNOSIS — R634 Abnormal weight loss: Secondary | ICD-10-CM

## 2020-10-27 NOTE — Progress Notes (Signed)
Established patient visit   Patient: Kristen Schroeder   DOB: Jun 11, 1941   79 y.o. Female  MRN: [id number] Visit Date: 10/27/2020  Today's healthcare provider: Wilhemena Durie, MD   Chief Complaint  Patient presents with  . Follow-up  . Weight Loss   Subjective    HPI  Patient comes in today for follow-up.  He is fairly stable.  She is having trouble carrying around oxygen cylinder and would like an oxygen concentrator.  She is on 2 to 3 L per nasal cannula 24 hours a day. Follow up for weight loss Her weight is remained stable over the past few months. The patient was last seen for this 3 months ago. Changes made at last visit include none.  She reports good compliance with treatment. She feels that condition is Improved. She is not having side effects.    Wt Readings from Last 3 Encounters:  10/27/20 204 lb (92.5 kg)  07/27/20 213 lb 3.2 oz (96.7 kg)  06/28/20 208 lb 12.8 oz (94.7 kg)    -----------------------------------------------------------------------------------------    Patient Active Problem List   Diagnosis Date Noted  . Depression, major, single episode, complete remission ([place]) 05/20/2020  . Incarcerated hernia of abdominal cavity 08/04/2018  . SBO (small bowel obstruction) ([place]) 04/22/2018  . Incarcerated hernia 04/14/2018  . Aneurysm ([place]) 07/10/2016  . Nonspecific abnormal finding 05/11/2015  . Allergic rhinitis 05/11/2015  . Anxiety 05/11/2015  . Bronchitis, chronic ([place]) 05/11/2015  . CCF (congestive cardiac failure) ([place]) 05/11/2015  . Clinical depression 05/11/2015  . 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 Essential (primary) hypertension 05/11/2015  . Acid reflux 05/11/2015  . HLD (hyperlipidemia) 05/11/2015  . Adult hypothyroidism 05/11/2015  . Cervical dysplasia, mild 05/11/2015  . Neuropathy 05/11/2015  . Adiposity 05/11/2015  . Obstructive apnea 05/11/2015  . Arthritis, degenerative 05/11/2015   Past  Medical History:  Diagnosis Date  . Arthritis   . COPD (chronic obstructive pulmonary disease) ([place])   . Diabetes mellitus without complication ([place])   . Dysrhythmia   . Heart murmur   . History of orthopnea   . Hypertension   . Hypothyroidism   . Neuropathy   . Oxygen dependent    3L  CONTINUOUS  . Pain CHRONIC BACK PAIN  . Shortness of breath dyspnea   . Wheezing    Social History   Tobacco Use  . Smoking status: Former Smoker    Packs/day: 0.50    Years: 15.00    Pack years: 7.50  . Smokeless tobacco: Never Used  . Tobacco comment: 30-40 years ago  Vaping Use  . Vaping Use: Never used  Substance Use Topics  . Alcohol use: No    Alcohol/week: 0.0 standard drinks  . Drug use: No   Allergies  Allergen Reactions  . Codeine Nausea And Vomiting and Nausea Only     Medications: Outpatient Medications Prior to Visit  Medication Sig  . arformoterol (BROVANA) 15 MCG/2ML NEBU Take 2 mLs (15 mcg total) by nebulization 2 (two) times daily.  Marland Kitchen aspirin EC 81 MG tablet Take 81 mg by mouth daily.  . blood glucose meter kit and supplies KIT Dispense based on patient and insurance preference. Use up to four times daily as directed. (FOR ICD-9 250.00, 250.01).  . Blood Glucose Monitoring Suppl (ONE TOUCH ULTRA 2) w/Device KIT 1 each by Does not apply route daily.  . enalapril (VASOTEC) 20 MG tablet TAKE 1 TABLET BY MOUTH EVERY DAY  .  fluticasone (FLONASE) 50 MCG/ACT nasal spray Place 1 spray into both nostrils daily as needed.   . furosemide (LASIX) 20 MG tablet Take 2 tablets (40 mg total) by mouth daily.  Marland Kitchen gabapentin (NEURONTIN) 300 MG capsule TAKE 2 CAPSULES IN THE MORNING, 1 CAPSULE IN THE AFTERNOON AND 2 CAPSULES IN THE EVENING  . ipratropium-albuterol (DUONEB) 0.5-2.5 (3) MG/3ML SOLN Use one vial 3 x a day prn for copd/asthma  . isosorbide mononitrate (IMDUR) 30 MG 24 hr tablet Take 30 mg by mouth daily as needed (high bp).  Marland Kitchen levothyroxine (SYNTHROID) 125 MCG tablet TAKE 1  TABLET BY MOUTH DAILY  . lisinopril (ZESTRIL) 20 MG tablet Take by mouth.  . loratadine (CLARITIN) 10 MG tablet Take 1 tablet (10 mg total) by mouth daily.  Marland Kitchen LORazepam (ATIVAN) 0.5 MG tablet Take 1 tablet (0.5 mg total) by mouth 2 (two) times daily as needed for anxiety.  . meclizine (ANTIVERT) 12.5 MG tablet Take 1 tablet (12.5 mg total) by mouth 3 (three) times daily as needed for dizziness.  . mometasone-formoterol (DULERA) 100-5 MCG/ACT AERO Inhale 2 puffs into the lungs 2 (two) times daily.  . MULTIPLE VITAMIN PO Take 1 tablet by mouth daily.   Marland Kitchen omeprazole (PRILOSEC) 40 MG capsule TAKE 1 CAPSULE BY MOUTH EVERY DAY  . ONETOUCH ULTRA test strip USE 1 STRIP TO TEST GLUCOSE TWICE DAILY AS NEEDED  . OXYGEN Inhale 4 L into the lungs.  Marland Kitchen PARoxetine (PAXIL) 10 MG tablet Take 1 tablet (10 mg total) by mouth at bedtime.  . predniSONE (DELTASONE) 20 MG tablet Take 1 tablet (20 mg total) by mouth daily with breakfast.  . roflumilast (DALIRESP) 500 MCG TABS tablet Take 500 mcg by mouth daily. As needed  . sertraline (ZOLOFT) 50 MG tablet Take 1 tablet (50 mg total) by mouth daily.  Marland Kitchen tiotropium (SPIRIVA) 18 MCG inhalation capsule Place into inhaler and inhale.  Marland Kitchen azithromycin (ZITHROMAX) 250 MG tablet TAKE 2 TABLETS BY MOUTH TODAY, THEN TAKE 1 TABLET DAILY FOR 4 DAYS   No facility-administered medications prior to visit.    Review of Systems  Constitutional: Positive for unexpected weight change. Negative for activity change, appetite change, chills, diaphoresis, fatigue and fever.  Respiratory: Negative.   Cardiovascular: Negative.   Gastrointestinal: Negative.   Neurological: Negative for dizziness, light-headedness and headaches.      Objective    BP 135/90 (BP Location: Right Arm, Patient Position: Sitting, Cuff Size: Large)   Pulse 79   Wt 204 lb (92.5 kg)   BMI 31.95 kg/m    Physical Exam Vitals and nursing note reviewed.  Constitutional:      Appearance: Normal appearance.  She is normal weight.  HENT:     Right Ear: Tympanic membrane normal.     Left Ear: Tympanic membrane normal.     Nose: Nose normal.     Mouth/Throat:     Mouth: Mucous membranes are moist.     Pharynx: Oropharynx is clear.  Eyes:     General: No scleral icterus.    Conjunctiva/sclera: Conjunctivae normal.  Cardiovascular:     Rate and Rhythm: Normal rate and regular rhythm.     Pulses: Normal pulses.     Heart sounds: Normal heart sounds.  Pulmonary:     Effort: Pulmonary effort is normal.     Breath sounds: Normal breath sounds.  Abdominal:     Palpations: Abdomen is soft.     Tenderness: There is no abdominal tenderness.  Musculoskeletal:  Cervical back: Normal range of motion and neck supple.  Skin:    General: Skin is warm and dry.  Neurological:     Mental Status: She is alert and oriented to person, place, and time.  Psychiatric:        Mood and Affect: Mood normal.        Behavior: Behavior normal.        Thought Content: Thought content normal.        Judgment: Judgment normal.       No results found for any visits on 10/27/20.  Assessment & Plan     1. Pulmonary fibrosis (HCC) On oxygen 24 hours a day. We will attempt to try to get patient an oxygen concentrator.  Might be more efficient to do it through pulmonary.  2. Essential (primary) hypertension   3. Obstructive apnea   4. Adult hypothyroidism   5. DDD (degenerative disc disease), lumbosacral Patient limited to walking around the house.  Difficulty with this.  Some sort of motorized vehicle around the home would be very helpful.  6. Class 1 obesity due to excess calories with serious comorbidity and body mass index (BMI) of 33.0 to 33.9 in adult   7. Weight loss This is stabilized. Clinically able now.   No follow-ups on file.      I, Wilhemena Durie, MD, have reviewed all documentation for this visit. The documentation on 10/31/20 for the exam, diagnosis, procedures, and orders  are all accurate and complete.    Dredyn Gubbels Cranford Mon, MD  [facility] [phone] (phone) [phone] (fax)  [place]

## 2020-11-01 DIAGNOSIS — J449 Chronic obstructive pulmonary disease, unspecified: Secondary | ICD-10-CM | POA: Diagnosis not present

## 2020-11-02 ENCOUNTER — Other Ambulatory Visit: Payer: Self-pay | Admitting: Family Medicine

## 2020-11-02 DIAGNOSIS — G629 Polyneuropathy, unspecified: Secondary | ICD-10-CM

## 2020-11-02 NOTE — Addendum Note (Signed)
Addended by: Kavin Leech E on: 11/02/2020 01:52 PM   Modules accepted: Orders

## 2020-11-14 DIAGNOSIS — J449 Chronic obstructive pulmonary disease, unspecified: Secondary | ICD-10-CM | POA: Diagnosis not present

## 2020-11-18 ENCOUNTER — Telehealth: Payer: Self-pay

## 2020-11-18 NOTE — Telephone Encounter (Signed)
Copied from CRM [id number]. Topic: General - Other >> Nov 18, 2020 11:00 AM Tamela Oddi wrote: Reason for CRM: Patient would like the nurse to call her regarding some paperwork.  Would not give any other details.  Please call at [phone]

## 2020-11-22 ENCOUNTER — Ambulatory Visit: Payer: Medicare Other | Admitting: Hospice and Palliative Medicine

## 2020-11-23 NOTE — Telephone Encounter (Signed)
Patient is wanting another portable oxygen tank to carry on her shoulder like a pocketbook. She is wanting Dr. Sullivan Lone to order this for her. Ok to order? If so, who would we send the order to? Please advise. Thanks!

## 2020-11-27 ENCOUNTER — Other Ambulatory Visit: Payer: Self-pay | Admitting: Internal Medicine

## 2020-12-02 DIAGNOSIS — J449 Chronic obstructive pulmonary disease, unspecified: Secondary | ICD-10-CM | POA: Diagnosis not present

## 2020-12-06 ENCOUNTER — Telehealth: Payer: Self-pay | Admitting: Family Medicine

## 2020-12-06 ENCOUNTER — Other Ambulatory Visit: Payer: Self-pay | Admitting: Family Medicine

## 2020-12-06 DIAGNOSIS — R059 Cough, unspecified: Secondary | ICD-10-CM

## 2020-12-06 NOTE — Telephone Encounter (Signed)
Requested medication (s) are due for refill today: yes  Requested medication (s) are on the active medication list: yes  Last refill:  10/05/20 #6 1 refill  Future visit scheduled: yes in 1 month  Notes to clinic:  Dx code needed per pharmacy , no assigned protocol     Requested Prescriptions  Pending Prescriptions Disp Refills   azithromycin (ZITHROMAX) 250 MG tablet [Pharmacy Med Name: AZITHROMYCIN 250 MG TABLET] 6 tablet 1    Sig: TAKE 2 TABLETS BY MOUTH TODAY, THEN TAKE 1 TABLET DAILY FOR 4 DAYS      Off-Protocol Failed - 12/06/2020  1:31 PM      Failed - Medication not assigned to a protocol, review manually.      Passed - Valid encounter within last 12 months    Recent Outpatient Visits           1 month ago Pulmonary fibrosis [facility])   [facility] Maple Hudson., MD   4 months ago Urinary frequency   [facility] Maple Hudson., MD   4 months ago Weight loss   [facility] Maple Hudson., MD   5 months ago Prediabetes   [facility] Maple Hudson., MD   6 months ago Pulmonary fibrosis [facility])   [facility] Maple Hudson., MD       Future Appointments             In 1 month Maple Hudson., MD [facility]

## 2020-12-06 NOTE — Telephone Encounter (Signed)
Copied from CRM [id number]. Topic: Medicare AWV >> Dec 06, 2020 12:11 PM Claudette Laws R wrote: Reason for CRM:   Left message for patient to call back and schedule Medicare Annual Wellness Visit (AWV) in office.   If not able to come in office, please offer to do virtually or by telephone.   Last AWV 12/10/2019  Please schedule at anytime with [facility] Health Advisor.  If any questions, please contact me at [phone]

## 2020-12-07 NOTE — Telephone Encounter (Signed)
That should come from pulmonary doctor.

## 2020-12-07 NOTE — Telephone Encounter (Signed)
Patient was advised.  

## 2020-12-15 DIAGNOSIS — J449 Chronic obstructive pulmonary disease, unspecified: Secondary | ICD-10-CM | POA: Diagnosis not present

## 2020-12-17 ENCOUNTER — Other Ambulatory Visit: Payer: Self-pay | Admitting: Family Medicine

## 2020-12-17 DIAGNOSIS — R059 Cough, unspecified: Secondary | ICD-10-CM

## 2020-12-18 NOTE — Telephone Encounter (Signed)
Requested medication (s) are due for refill today: no  Requested medication (s) are on the active medication list: yes  Last refill: 11/11/2020  Future visit scheduled: yes  Notes to clinic:  this refill cannot be delegated    Requested Prescriptions  Pending Prescriptions Disp Refills   azithromycin (ZITHROMAX) 250 MG tablet [Pharmacy Med Name: AZITHROMYCIN 250 MG TABLET] 6 tablet 1    Sig: TAKE 2 TABLETS BY MOUTH TODAY, THEN TAKE 1 TABLET DAILY FOR 4 DAYS      Off-Protocol Failed - 12/17/2020  5:10 PM      Failed - Medication not assigned to a protocol, review manually.      Passed - Valid encounter within last 12 months    Recent Outpatient Visits           1 month ago Pulmonary fibrosis [facility])   [facility] Maple Hudson., MD   4 months ago Urinary frequency   [facility] Maple Hudson., MD   4 months ago Weight loss   [facility] Maple Hudson., MD   5 months ago Prediabetes   [facility] Maple Hudson., MD   7 months ago Pulmonary fibrosis [facility])   [facility] Maple Hudson., MD       Future Appointments             In 1 month Maple Hudson., MD [facility]

## 2020-12-20 ENCOUNTER — Ambulatory Visit (INDEPENDENT_AMBULATORY_CARE_PROVIDER_SITE_OTHER): Payer: Medicare Other | Admitting: Family Medicine

## 2020-12-20 DIAGNOSIS — J9611 Chronic respiratory failure with hypoxia: Secondary | ICD-10-CM | POA: Diagnosis not present

## 2020-12-20 DIAGNOSIS — J309 Allergic rhinitis, unspecified: Secondary | ICD-10-CM | POA: Diagnosis not present

## 2020-12-20 DIAGNOSIS — G4733 Obstructive sleep apnea (adult) (pediatric): Secondary | ICD-10-CM | POA: Diagnosis not present

## 2020-12-20 DIAGNOSIS — J9612 Chronic respiratory failure with hypercapnia: Secondary | ICD-10-CM | POA: Diagnosis not present

## 2020-12-20 MED ORDER — CETIRIZINE HCL 10 MG PO TABS
10.0000 mg | ORAL_TABLET | Freq: Every day | ORAL | 11 refills | Status: DC
Start: 2020-12-20 — End: 2021-03-31

## 2020-12-20 NOTE — Progress Notes (Signed)
Virtual telephone visit    Virtual Visit via Telephone Note   This visit type was conducted due to national recommendations for restrictions regarding the COVID-19 Pandemic (e.g. social distancing) in an effort to limit this patient's exposure and mitigate transmission in our community. Due to her co-morbid illnesses, this patient is at least at moderate risk for complications without adequate follow up. This format is felt to be most appropriate for this patient at this time. The patient did not have access to video technology or had technical difficulties with video requiring transitioning to audio format only (telephone). Physical exam was limited to content and character of the telephone converstion.    Patient location: Home Provider location: Office  I discussed the limitations of evaluation and management by telemedicine and the availability of in person appointments. The patient expressed understanding and agreed to proceed.   Visit Date: 12/20/2020  Today's healthcare provider: Wilhemena Durie, MD   No chief complaint on file.  Subjective    HPI  -80 year old causing with a complaint of couple weeks of head congestion just in the morning.  No fevers myalgias cough sore throat headache or drainage.  Is better as morning goes on.       Medications: Outpatient Medications Prior to Visit  Medication Sig  . arformoterol (BROVANA) 15 MCG/2ML NEBU Take 2 mLs (15 mcg total) by nebulization 2 (two) times daily.  Marland Kitchen aspirin EC 81 MG tablet Take 81 mg by mouth daily.  Marland Kitchen azithromycin (ZITHROMAX) 250 MG tablet TAKE 2 TABLETS BY MOUTH TODAY, THEN TAKE 1 TABLET DAILY FOR 4 DAYS  . blood glucose meter kit and supplies KIT Dispense based on patient and insurance preference. Use up to four times daily as directed. (FOR ICD-9 250.00, 250.01).  . Blood Glucose Monitoring Suppl (ONE TOUCH ULTRA 2) w/Device KIT 1 each by Does not apply route daily.  . enalapril (VASOTEC) 20 MG tablet  TAKE 1 TABLET BY MOUTH EVERY DAY  . fluticasone (FLONASE) 50 MCG/ACT nasal spray Place 1 spray into both nostrils daily as needed.   . furosemide (LASIX) 20 MG tablet Take 2 tablets (40 mg total) by mouth daily.  Marland Kitchen gabapentin (NEURONTIN) 300 MG capsule TAKE 2 CAPSULES IN THE MORNING, 1 CAPSULE IN THE AFTERNOON AND 2 CAPSULES IN THE EVENING  . ipratropium-albuterol (DUONEB) 0.5-2.5 (3) MG/3ML SOLN INHALE 1 VIAL VIA NEBULIZER BY MOUTH 3 TIMES A DAY AS NEEDED FOR COPD/ASTHMA  . isosorbide mononitrate (IMDUR) 30 MG 24 hr tablet Take 30 mg by mouth daily as needed (high bp).  Marland Kitchen levothyroxine (SYNTHROID) 125 MCG tablet TAKE 1 TABLET BY MOUTH DAILY  . lisinopril (ZESTRIL) 20 MG tablet Take by mouth.  . loratadine (CLARITIN) 10 MG tablet Take 1 tablet (10 mg total) by mouth daily.  Marland Kitchen LORazepam (ATIVAN) 0.5 MG tablet Take 1 tablet (0.5 mg total) by mouth 2 (two) times daily as needed for anxiety.  . meclizine (ANTIVERT) 12.5 MG tablet Take 1 tablet (12.5 mg total) by mouth 3 (three) times daily as needed for dizziness.  . mometasone-formoterol (DULERA) 100-5 MCG/ACT AERO Inhale 2 puffs into the lungs 2 (two) times daily.  . MULTIPLE VITAMIN PO Take 1 tablet by mouth daily.   Marland Kitchen omeprazole (PRILOSEC) 40 MG capsule TAKE 1 CAPSULE BY MOUTH EVERY DAY  . ONETOUCH ULTRA test strip USE 1 STRIP TO TEST GLUCOSE TWICE DAILY AS NEEDED  . OXYGEN Inhale 4 L into the lungs.  Marland Kitchen PARoxetine (PAXIL) 10 MG tablet  Take 1 tablet (10 mg total) by mouth at bedtime.  . predniSONE (DELTASONE) 20 MG tablet Take 1 tablet (20 mg total) by mouth daily with breakfast.  . roflumilast (DALIRESP) 500 MCG TABS tablet Take 500 mcg by mouth daily. As needed  . sertraline (ZOLOFT) 50 MG tablet Take 1 tablet (50 mg total) by mouth daily.  Marland Kitchen tiotropium (SPIRIVA) 18 MCG inhalation capsule Place into inhaler and inhale.   No facility-administered medications prior to visit.    Review of Systems     Objective    There were no vitals  taken for this visit.   Patient is alert pleasant and cooperative.  She is in no respiratory distress.   Assessment & Plan     1. Allergic rhinitis, unspecified seasonality, unspecified trigger  loratadine to Zyrtec.  Get humidifier for the bedroom. No antibiotics indicated.  2. Obstructive apnea   3. Chronic respiratory failure with hypoxia and hypercapnia (HCC) Patient on 24-hour oxygen.  No problems breathing.   No follow-ups on file.    I discussed the assessment and treatment plan with the patient. The patient was provided an opportunity to ask questions and all were answered. The patient agreed with the plan and demonstrated an understanding of the instructions.   The patient was advised to call back or seek an in-person evaluation if the symptoms worsen or if the condition fails to improve as anticipated.  I provided 10 minutes of non-face-to-face time during this encounter.  I, Wilhemena Durie, MD, have reviewed all documentation for this visit. The documentation on 12/20/20 for the exam, diagnosis, procedures, and orders are all accurate and complete.   Richard Cranford Mon, MD [facility] [phone] (phone) [phone] (fax)  [place]

## 2020-12-21 ENCOUNTER — Other Ambulatory Visit: Payer: Self-pay | Admitting: Internal Medicine

## 2020-12-27 ENCOUNTER — Other Ambulatory Visit: Payer: Self-pay

## 2020-12-27 ENCOUNTER — Ambulatory Visit (INDEPENDENT_AMBULATORY_CARE_PROVIDER_SITE_OTHER): Payer: Medicare Other | Admitting: Hospice and Palliative Medicine

## 2020-12-27 ENCOUNTER — Encounter: Payer: Self-pay | Admitting: Hospice and Palliative Medicine

## 2020-12-27 VITALS — BP 142/88 | HR 110 | Temp 97.1°F | Resp 16 | Ht 67.0 in | Wt 211.6 lb

## 2020-12-27 DIAGNOSIS — J9611 Chronic respiratory failure with hypoxia: Secondary | ICD-10-CM

## 2020-12-27 DIAGNOSIS — J449 Chronic obstructive pulmonary disease, unspecified: Secondary | ICD-10-CM | POA: Diagnosis not present

## 2020-12-27 DIAGNOSIS — I10 Essential (primary) hypertension: Secondary | ICD-10-CM

## 2020-12-27 DIAGNOSIS — R0602 Shortness of breath: Secondary | ICD-10-CM | POA: Diagnosis not present

## 2020-12-27 DIAGNOSIS — I509 Heart failure, unspecified: Secondary | ICD-10-CM | POA: Diagnosis not present

## 2020-12-27 MED ORDER — DULERA 100-5 MCG/ACT IN AERO: 2.0000 | INHALATION_SPRAY | Freq: Two times a day (BID) | RESPIRATORY_TRACT | 3 refills | Status: DC

## 2020-12-27 NOTE — Progress Notes (Signed)
[facility] [place], [place] [address]  Pulmonary Sleep Medicine   Office Visit Note  Patient Name: Kristen Schroeder DOB: 1940/12/05 MRN [id number]  Date of Service: 12/29/2020  Complaints/HPI: Patient is here for routine pulmonary follow-up COPD-most recent PFT, severe obstructive lung disease, FEV1 0.68 L, 39% predicted Breathing has remained stable, no recent hospitalizations or exacerbations Continues to wear supplemental oxygen, 4LPM via [place] during the day and at night, SpO2 levels remain stabilized Switches between Spiriva and Dulera due to insurance coverage, prefers [place] but unable to afford out of pocket cost Continues to arformoterol nebulizer and duo-nebs as needed History of CHF-compensated, managed by cardiology  ROS  General: (-) fever, (-) chills, (-) night sweats, (-) weakness Skin: (-) rashes, (-) itching,. Eyes: (-) visual changes, (-) redness, (-) itching. Nose and Sinuses: (-) nasal stuffiness or itchiness, (-) postnasal drip, (-) nosebleeds, (-) sinus trouble. Mouth and Throat: (-) sore throat, (-) hoarseness. Neck: (-) swollen glands, (-) enlarged thyroid, (-) neck pain. Respiratory: - cough, (-) bloody sputum, + shortness of breath, - wheezing. Cardiovascular: - ankle swelling, (-) chest pain. Lymphatic: (-) lymph node enlargement. Neurologic: (-) numbness, (-) tingling. Psychiatric: (-) anxiety, (-) depression   Current Medication: Outpatient Encounter Medications as of 12/27/2020  Medication Sig Note  . arformoterol (BROVANA) 15 MCG/2ML NEBU Take 2 mLs (15 mcg total) by nebulization 2 (two) times daily.   Marland Kitchen aspirin EC 81 MG tablet Take 81 mg by mouth daily.   Marland Kitchen azithromycin (ZITHROMAX) 250 MG tablet TAKE 2 TABLETS BY MOUTH TODAY, THEN TAKE 1 TABLET DAILY FOR 4 DAYS   . blood glucose meter kit and supplies KIT Dispense based on patient and insurance preference. Use up to four times daily as directed. (FOR ICD-9 250.00, 250.01).    . Blood Glucose Monitoring Suppl (ONE TOUCH ULTRA 2) w/Device KIT 1 each by Does not apply route daily.   . cetirizine (ZYRTEC) 10 MG tablet Take 1 tablet (10 mg total) by mouth daily.   . enalapril (VASOTEC) 20 MG tablet TAKE 1 TABLET BY MOUTH EVERY DAY   . fluticasone (FLONASE) 50 MCG/ACT nasal spray Place 1 spray into both nostrils daily as needed.    . furosemide (LASIX) 20 MG tablet Take 2 tablets (40 mg total) by mouth daily.   Marland Kitchen gabapentin (NEURONTIN) 300 MG capsule TAKE 2 CAPSULES IN THE MORNING, 1 CAPSULE IN THE AFTERNOON AND 2 CAPSULES IN THE EVENING   . ipratropium-albuterol (DUONEB) 0.5-2.5 (3) MG/3ML SOLN INHALE 1 VIAL VIA NEBULIZER BY MOUTH 3 TIMES A DAY AS NEEDED FOR COPD/ASTHMA   . isosorbide mononitrate (IMDUR) 30 MG 24 hr tablet Take 30 mg by mouth daily as needed (high bp).   Marland Kitchen levothyroxine (SYNTHROID) 125 MCG tablet TAKE 1 TABLET BY MOUTH DAILY   . lisinopril (ZESTRIL) 20 MG tablet Take by mouth.   . loratadine (CLARITIN) 10 MG tablet Take 1 tablet (10 mg total) by mouth daily.   Marland Kitchen LORazepam (ATIVAN) 0.5 MG tablet Take 1 tablet (0.5 mg total) by mouth 2 (two) times daily as needed for anxiety.   . meclizine (ANTIVERT) 12.5 MG tablet Take 1 tablet (12.5 mg total) by mouth 3 (three) times daily as needed for dizziness.   . MULTIPLE VITAMIN PO Take 1 tablet by mouth daily.    Marland Kitchen omeprazole (PRILOSEC) 40 MG capsule TAKE 1 CAPSULE BY MOUTH EVERY DAY   . ONETOUCH ULTRA test strip USE 1 STRIP TO TEST GLUCOSE TWICE DAILY  AS NEEDED   . OXYGEN Inhale 4 L into the lungs. 10/15/2018: Patient uses 3 to 3 1/2 liters continuously  . PARoxetine (PAXIL) 10 MG tablet Take 1 tablet (10 mg total) by mouth at bedtime.   . predniSONE (DELTASONE) 20 MG tablet Take 1 tablet (20 mg total) by mouth daily with breakfast.   . roflumilast (DALIRESP) 500 MCG TABS tablet Take 500 mcg by mouth daily. As needed   . sertraline (ZOLOFT) 50 MG tablet Take 1 tablet (50 mg total) by mouth daily.   .  [DISCONTINUED] mometasone-formoterol (DULERA) 100-5 MCG/ACT AERO Inhale 2 puffs into the lungs 2 (two) times daily.   . [DISCONTINUED] mometasone-formoterol (DULERA) 100-5 MCG/ACT AERO Inhale 2 puffs into the lungs 2 (two) times daily.   . [DISCONTINUED] tiotropium (SPIRIVA) 18 MCG inhalation capsule Place into inhaler and inhale. 10/15/2018: Patient will resume taking once dulera inhaler is completed (until she can afford new dulera)   No facility-administered encounter medications on file as of 12/27/2020.    Surgical History: Past Surgical History:  Procedure Laterality Date  . ABDOMINAL HYSTERECTOMY    . BOWEL RESECTION N/A 04/14/2018   Procedure: SMALL BOWEL RESECTION;  Surgeon: Jules Husbands, MD;  Location: [facility] ORS;  Service: General;  Laterality: N/A;  . BREAST BIOPSY Right    benign  . CATARACT EXTRACTION W/PHACO Left 07/03/2016   Procedure: CATARACT EXTRACTION PHACO AND INTRAOCULAR LENS PLACEMENT (IOC);  Surgeon: Birder Robson, MD;  Location: [facility] ORS;  Service: Ophthalmology;  Laterality: Left;  Lot: [id number] H [place]: 00:40.1 AP%: 17.4 CDE:6.94  . HERNIA REPAIR    . LAPAROTOMY N/A 09/04/2018   Procedure: EXPLORATORY LAPAROTOMY;  Surgeon: Olean Ree, MD;  Location: [facility] ORS;  Service: General;  Laterality: N/A;  . TUBAL LIGATION    . VENTRAL HERNIA REPAIR N/A 04/14/2018   Procedure: HERNIA REPAIR VENTRAL ADULT;  Surgeon: Jules Husbands, MD;  Location: [facility] ORS;  Service: General;  Laterality: N/A;    Medical History: Past Medical History:  Diagnosis Date  . Arthritis   . COPD (chronic obstructive pulmonary disease) ([place])   . Diabetes mellitus without complication ([place])   . Dysrhythmia   . Heart murmur   . History of orthopnea   . Hypertension   . Hypothyroidism   . Neuropathy   . Oxygen dependent    3L  CONTINUOUS  . Pain CHRONIC BACK PAIN  . Shortness of breath dyspnea   . Wheezing     Family History: Family History  Problem Relation Age of Onset  . Heart  disease Mother   . Drug abuse Other   . Hypertension Other     Social History: Social History   Socioeconomic History  . Marital status: Married    Spouse name: Not on file  . Number of children: 4  . Years of education: Not on file  . Highest education level: 8th grade  Occupational History  . Occupation: retired  Tobacco Use  . Smoking status: Former Smoker    Packs/day: 0.50    Years: 15.00    Pack years: 7.50  . Smokeless tobacco: Never Used  . Tobacco comment: 30-40 years ago  Vaping Use  . Vaping Use: Never used  Substance and Sexual Activity  . Alcohol use: No    Alcohol/week: 0.0 standard drinks  . Drug use: No  . Sexual activity: Never  Other Topics Concern  . Not on file  Social History Narrative  . Not on file   Social Determinants  of Health   Financial Resource Strain: Not on file  Food Insecurity: Not on file  Transportation Needs: Not on file  Physical Activity: Not on file  Stress: Not on file  Social Connections: Not on file  Intimate Partner Violence: Not on file    Vital Signs: Blood pressure (!) 142/88, pulse (!) 110, temperature (!) 97.1 F (36.2 C), resp. rate 16, height _0  (1.702 m), weight 211 lb 9.6 oz (96 kg), SpO2 98 %.  Examination: General Appearance: The patient is well-developed, well-nourished, and in no distress. Skin: Gross inspection of skin unremarkable. Head: normocephalic, no gross deformities. Eyes: no gross deformities noted. ENT: ears appear grossly normal no exudates. Neck: Supple. No thyromegaly. No LAD. Respiratory: Diminished throughout, no evidence of rhonchi, wheeze or rales. Cardiovascular: Normal S1 and S2 without murmur or rub. Extremities: No cyanosis. pulses are equal. Neurologic: Alert and oriented. No involuntary movements.  LABS: No results found for this or any previous visit (from the past 2160 hour(s)).  Radiology: CT ANGIO HEAD W OR WO CONTRAST  Result Date: 07/14/2020 CLINICAL DATA:   Follow-up cerebral aneurysm. EXAM: CT ANGIOGRAPHY HEAD TECHNIQUE: Multidetector CT imaging of the head was performed using the standard protocol during bolus administration of intravenous contrast. Multiplanar CT image reconstructions and MIPs were obtained to evaluate the vascular anatomy. CONTRAST:  25m ISOVUE-370 IOPAMIDOL (ISOVUE-370) INJECTION 76% COMPARISON:  07/10/2019 FINDINGS: CT HEAD Brain: There is no evidence of an acute infarct, intracranial hemorrhage, mass, midline shift, or extra-axial fluid collection. The ventricles and sulci are within normal limits for age. Vascular: Calcified atherosclerosis at the skull base. No hyperdense vessel. Skull: Unchanged indeterminate 4 cm region of decreased density laterally and inferiorly in the left parietal skull without expansion or cortical disruption. No new or progressive finding. Sinuses: The visualized paranasal sinuses and mastoid air cells are clear. Orbits: Unchanged focal depression of the right lamina papyracea. Left cataract extraction. CTA HEAD Anterior circulation: The internal carotid arteries are patent from skull base to carotid termini with mild calcified plaque bilaterally not resulting in significant stenosis. A 3 mm aneurysm projecting posteriorly from the right posterior communicating artery origin and a 3 mm superiorly projecting left paraophthalmic ICA aneurysm are both unchanged. ACAs and MCAs are patent without evidence of a proximal branch occlusion or significant proximal stenosis. Posterior circulation: The visualized distal vertebral arteries are widely patent to the basilar and codominant. Patent PICA, AICA, and SCA origins are identified bilaterally. The basilar artery is widely patent. There are right larger than left posterior communicating arteries. Both PCAs are patent without evidence of a significant proximal stenosis. No aneurysm is identified. Venous sinuses: Patent. Anatomic variants: None. IMPRESSION: 1. 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 2. Mild intracranial atherosclerosis without medium or large vessel occlusion or significant stenosis. Electronically Signed   By: ALogan BoresM.D.   On: 07/14/2020 13:35    No results found.  No results found.    Assessment and Plan: Patient Active Problem List   Diagnosis Date Noted  . Depression, major, single episode, complete remission ([place] 05/20/2020  . Incarcerated hernia of abdominal cavity 08/04/2018  . SBO (small bowel obstruction) ([place] 04/22/2018  . Incarcerated hernia 04/14/2018  . Aneurysm ([place] 07/10/2016  . Nonspecific abnormal finding 05/11/2015  . Allergic rhinitis 05/11/2015  . Anxiety 05/11/2015  . Bronchitis, chronic ([place] 05/11/2015  . CCF (congestive cardiac failure) ([place] 05/11/2015  . Clinical depression 05/11/2015  . DDD (degenerative  disc disease), 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/11/2015  . Essential (primary) hypertension 05/11/2015  . Acid reflux 05/11/2015  . HLD (hyperlipidemia) 05/11/2015  . Adult hypothyroidism 05/11/2015  . Cervical dysplasia, mild 05/11/2015  . Neuropathy 05/11/2015  . Adiposity 05/11/2015  . Obstructive apnea 05/11/2015  . Arthritis, degenerative 05/11/2015   1. Chronic respiratory failure with hypoxia (HCC) Continue with supplemental oxygen and other supportive measures at this time  2. Chronic obstructive pulmonary disease, unspecified COPD type ([place]) No recent exacerbation or hospitalizations Continue with current inhalers and nebulizer  3. SOB (shortness of breath) Spirometry readings stable compared to most recent PFT - Spirometry with Graph  4. Congestive heart failure, unspecified HF chronicity, unspecified heart failure type ([place]) Appears euvolemic today, followed by cardiology  5. Essential hypertension BP slightly elevated today--advised to monitor readings at home and discuss further with PCP/cardiologist   General Counseling: I have discussed  the findings of the evaluation and examination with Lam.  I have also discussed any further diagnostic evaluation thatmay be needed or ordered today. Kasen verbalizes understanding of the findings of todays visit. We also reviewed her medications today and discussed drug interactions and side effects including but not limited excessive drowsiness and altered mental states. We also discussed that there is always a risk not just to her but also people around her. she has been encouraged to call the office with any questions or concerns that should arise related to todays visit.  Orders Placed This Encounter  Procedures  . Spirometry with Graph    Order Specific Question:   Where should this test be performed?    Answer:   [facility]     Time spent: 75  I have personally obtained a history, examined the patient, evaluated laboratory and imaging results, formulated the assessment and plan and placed orders. This patient was seen by Casey Burkitt AGNP-C in Collaboration with Dr. Devona Konig as a part of collaborative care agreement.    Allyne Gee, MD [facility] Pulmonary and Critical Care Sleep medicine

## 2020-12-29 ENCOUNTER — Encounter: Payer: Self-pay | Admitting: Hospice and Palliative Medicine

## 2020-12-29 ENCOUNTER — Other Ambulatory Visit: Payer: Self-pay

## 2020-12-29 MED ORDER — TIOTROPIUM BROMIDE MONOHYDRATE 18 MCG IN CAPS: 18.0000 ug | ORAL_CAPSULE | Freq: Every day | RESPIRATORY_TRACT | 12 refills | Status: DC

## 2021-01-02 DIAGNOSIS — J449 Chronic obstructive pulmonary disease, unspecified: Secondary | ICD-10-CM | POA: Diagnosis not present

## 2021-01-06 ENCOUNTER — Other Ambulatory Visit: Payer: Self-pay | Admitting: Family Medicine

## 2021-01-06 DIAGNOSIS — K219 Gastro-esophageal reflux disease without esophagitis: Secondary | ICD-10-CM

## 2021-01-09 ENCOUNTER — Other Ambulatory Visit: Payer: Self-pay

## 2021-01-12 DIAGNOSIS — J449 Chronic obstructive pulmonary disease, unspecified: Secondary | ICD-10-CM | POA: Diagnosis not present

## 2021-01-18 ENCOUNTER — Other Ambulatory Visit: Payer: Self-pay

## 2021-01-18 ENCOUNTER — Encounter: Payer: Self-pay | Admitting: Family Medicine

## 2021-01-18 ENCOUNTER — Telehealth: Payer: Self-pay | Admitting: *Deleted

## 2021-01-18 ENCOUNTER — Ambulatory Visit (INDEPENDENT_AMBULATORY_CARE_PROVIDER_SITE_OTHER): Payer: Medicare Other | Admitting: Family Medicine

## 2021-01-18 VITALS — BP 138/58 | HR 80 | Temp 98.0°F | Resp 24 | Ht 66.0 in | Wt 210.0 lb

## 2021-01-18 DIAGNOSIS — R059 Cough, unspecified: Secondary | ICD-10-CM

## 2021-01-18 DIAGNOSIS — M5137 Other intervertebral disc degeneration, lumbosacral region: Secondary | ICD-10-CM

## 2021-01-18 DIAGNOSIS — I10 Essential (primary) hypertension: Secondary | ICD-10-CM

## 2021-01-18 DIAGNOSIS — J9612 Chronic respiratory failure with hypercapnia: Secondary | ICD-10-CM

## 2021-01-18 DIAGNOSIS — J9611 Chronic respiratory failure with hypoxia: Secondary | ICD-10-CM

## 2021-01-18 MED ORDER — AZITHROMYCIN 250 MG PO TABS: ORAL_TABLET | ORAL | 1 refills | Status: DC

## 2021-01-18 NOTE — Chronic Care Management (AMB) (Signed)
  Chronic Care Management   Outreach Note  01/18/2021 Name: [name] MRN: [id number] DOB: 1941/03/29  Kristen Schroeder is a 80 y.o. year old female who is a primary care patient of Maple Hudson., MD. I reached out to Kristen Schroeder by phone today in response to a referral sent by Ms. Chaniece L Barasch's PCP, Maple Hudson., MD     An unsuccessful telephone outreach was attempted today. The patient was referred to the case management team for assistance with care management and care coordination.   Follow Up Plan: A HIPAA compliant phone message was left for the patient providing contact information and requesting a return call.  The care management team will reach out to the patient again over the next 7 days.  If patient returns call to provider office, please advise to call Embedded Care Management Care Guide Avie Arenas at [phone]  [facility]abb[facility] Guide, Embedded Care Coordination [facility]  Care Management

## 2021-01-18 NOTE — Progress Notes (Addendum)
Established patient visit   Patient: Kristen Schroeder   DOB: 04/21/1941   80 y.o. Female  MRN: [id number] Visit Date: 01/18/2021  Today's healthcare provider: Wilhemena Durie, MD   Chief Complaint  Patient presents with  . Hypertension  . Pulmonary Fibrosis   . Degenerative disc disease   Subjective    HPI  Patient comes in today for follow-up for [place].  Is a mobility exam.  Note is the patient has never tried a manual wheelchair which she could probably manage around the house and is spite being on oxygen he has enough arm strength to propel a wheelchair. Hypertension, follow-up  BP Readings from Last 3 Encounters:  01/18/21 (!) 138/58  12/27/20 (!) 142/88  10/27/20 135/90   Wt Readings from Last 3 Encounters:  01/18/21 210 lb (95.3 kg)  12/27/20 211 lb 9.6 oz (96 kg)  10/27/20 204 lb (92.5 kg)     She was last seen for hypertension 3 months ago.  BP at that visit was 135/90. Management since that visit includes; Imdur and lisinopril. She reports good compliance with treatment. She is not having side effects.  She is not exercising. She is adherent to low salt diet.   Outside blood pressures are checked occasionally.  She does not smoke.  Use of agents associated with hypertension: none.   Pulmonary fibrosis ([place]) From 10/27/2020-On oxygen 24 hours a day. We will attempt to try to get patient an oxygen concentrator.  Might be more efficient to do it through pulmonary.  DDD (degenerative disc disease), lumbosacral From 10/27/2020-Patient limited to walking around the house.  Difficulty with this.  Some sort of motorized vehicle around the home would be very helpful.      Medications: Outpatient Medications Prior to Visit  Medication Sig  . arformoterol (BROVANA) 15 MCG/2ML NEBU Take 2 mLs (15 mcg total) by nebulization 2 (two) times daily.  Marland Kitchen aspirin EC 81 MG tablet Take 81 mg by mouth daily.  Marland Kitchen azithromycin (ZITHROMAX) 250 MG tablet TAKE 2  TABLETS BY MOUTH TODAY, THEN TAKE 1 TABLET DAILY FOR 4 DAYS  . blood glucose meter kit and supplies KIT Dispense based on patient and insurance preference. Use up to four times daily as directed. (FOR ICD-9 250.00, 250.01).  . Blood Glucose Monitoring Suppl (ONE TOUCH ULTRA 2) w/Device KIT 1 each by Does not apply route daily.  . cetirizine (ZYRTEC) 10 MG tablet Take 1 tablet (10 mg total) by mouth daily.  . enalapril (VASOTEC) 20 MG tablet TAKE 1 TABLET BY MOUTH EVERY DAY  . fluticasone (FLONASE) 50 MCG/ACT nasal spray Place 1 spray into both nostrils daily as needed.   . furosemide (LASIX) 20 MG tablet Take 2 tablets (40 mg total) by mouth daily.  Marland Kitchen gabapentin (NEURONTIN) 300 MG capsule TAKE 2 CAPSULES IN THE MORNING, 1 CAPSULE IN THE AFTERNOON AND 2 CAPSULES IN THE EVENING  . ipratropium-albuterol (DUONEB) 0.5-2.5 (3) MG/3ML SOLN INHALE 1 VIAL VIA NEBULIZER BY MOUTH 3 TIMES A DAY AS NEEDED FOR COPD/ASTHMA  . isosorbide mononitrate (IMDUR) 30 MG 24 hr tablet Take 30 mg by mouth daily as needed (high bp).  Marland Kitchen levothyroxine (SYNTHROID) 125 MCG tablet TAKE 1 TABLET BY MOUTH DAILY  . lisinopril (ZESTRIL) 20 MG tablet Take by mouth.  . loratadine (CLARITIN) 10 MG tablet Take 1 tablet (10 mg total) by mouth daily.  Marland Kitchen LORazepam (ATIVAN) 0.5 MG tablet Take 1 tablet (0.5 mg total) by mouth 2 (two)  times daily as needed for anxiety.  . meclizine (ANTIVERT) 12.5 MG tablet Take 1 tablet (12.5 mg total) by mouth 3 (three) times daily as needed for dizziness.  . MULTIPLE VITAMIN PO Take 1 tablet by mouth daily.   Marland Kitchen omeprazole (PRILOSEC) 40 MG capsule TAKE 1 CAPSULE BY MOUTH EVERY DAY  . ONETOUCH ULTRA test strip USE 1 STRIP TO TEST GLUCOSE TWICE DAILY AS NEEDED  . OXYGEN Inhale 4 L into the lungs.  Marland Kitchen PARoxetine (PAXIL) 10 MG tablet Take 1 tablet (10 mg total) by mouth at bedtime.  . predniSONE (DELTASONE) 20 MG tablet Take 1 tablet (20 mg total) by mouth daily with breakfast.  . roflumilast (DALIRESP) 500  MCG TABS tablet Take 500 mcg by mouth daily. As needed  . sertraline (ZOLOFT) 50 MG tablet Take 1 tablet (50 mg total) by mouth daily.  Marland Kitchen tiotropium (SPIRIVA) 18 MCG inhalation capsule Place 1 capsule (18 mcg total) into inhaler and inhale daily.   No facility-administered medications prior to visit.    Review of Systems  Constitutional: Negative for appetite change, chills, fatigue and fever.  Respiratory: Negative for chest tightness and shortness of breath.   Cardiovascular: Negative for chest pain and palpitations.  Gastrointestinal: Negative for abdominal pain, nausea and vomiting.  Neurological: Negative for dizziness and weakness.        Objective    BP (!) 138/58   Pulse 80   Temp 98 F (36.7 C)   Resp (!) 24   Ht '5\' 6"'  (1.676 m)   Wt 210 lb (95.3 kg)   SpO2 100% Comment: w/ 2 L of O2  BMI 33.89 kg/m  BP Readings from Last 3 Encounters:  01/18/21 (!) 138/58  12/27/20 (!) 142/88  10/27/20 135/90       Physical Exam Vitals and nursing note reviewed.  Constitutional:      Appearance: Normal appearance. She is normal weight.  HENT:     Right Ear: Tympanic membrane normal.     Left Ear: Tympanic membrane normal.     Nose: Nose normal.     Mouth/Throat:     Mouth: Mucous membranes are moist.     Pharynx: Oropharynx is clear.  Eyes:     General: No scleral icterus.    Conjunctiva/sclera: Conjunctivae normal.  Cardiovascular:     Rate and Rhythm: Normal rate and regular rhythm.     Pulses: Normal pulses.     Heart sounds: Normal heart sounds.  Pulmonary:     Effort: Pulmonary effort is normal.     Breath sounds: Normal breath sounds.  Abdominal:     Palpations: Abdomen is soft.     Tenderness: There is no abdominal tenderness.  Musculoskeletal:     Cervical back: Normal range of motion and neck supple.  Skin:    General: Skin is warm and dry.  Neurological:     General: No focal deficit present.     Mental Status: She is alert and oriented to person,  place, and time.     Comments: She has 5 out of 5 strength in her upper extremities and 5-/5 strength in her lower extremities.  Psychiatric:        Mood and Affect: Mood normal.        Behavior: Behavior normal.        Thought Content: Thought content normal.        Judgment: Judgment normal.       No results found for any visits on  01/18/21.  Assessment & Plan     1. Chronic respiratory failure with hypoxia and hypercapnia (HCC) Uses oxygen 24/7 - AMB Referral to [facility] Coordinaton  2. Essential (primary) hypertension   3. DDD (degenerative disc disease), lumbosacral Limited mobility told due to multiple medical problems.  At this time she might qualify for [facility] but lesser manual wheelchair around the house since this is the biggest issue at this time.  Is having trouble with her ADLs still she is unable to ambulate easily  consult CCM to see if social work might be able to get some help in getting a ramp built for the home for wheelchair use. 4. Cough Chronic COPD - azithromycin (ZITHROMAX) 250 MG tablet; TAKE 2 TABLETS BY MOUTH TODAY, THEN TAKE 1 TABLET DAILY FOR 4 DAYS  Dispense: 6 tablet; Refill: 1   No follow-ups on file.      I, Wilhemena Durie, MD, have reviewed all documentation for this visit. The documentation on 01/21/21 for the exam, diagnosis, procedures, and orders are all accurate and complete.    Richard Cranford Mon, MD  [facility] [phone] (phone) [phone] (fax)  [place]

## 2021-01-21 NOTE — Addendum Note (Signed)
Addended by: Bosie Clos on: 01/21/2021 04:32 PM   Modules accepted: Level of Service

## 2021-01-25 ENCOUNTER — Telehealth: Payer: Self-pay | Admitting: Family Medicine

## 2021-01-25 ENCOUNTER — Telehealth: Payer: Self-pay

## 2021-01-25 NOTE — Telephone Encounter (Signed)
Copied from CRM [id number]. Topic: Medicare AWV >> Jan 25, 2021 12:15 PM Claudette Laws R wrote: Reason for CRM: Left message for patient to call back and schedule Medicare Annual Wellness Visit (AWV) in office.   If not able to come in office, please offer to do virtually or by telephone.   Last AWV: 12/10/2019  Please schedule at anytime with [facility] Health Advisor.  If any questions, please contact me at [phone]

## 2021-01-25 NOTE — Chronic Care Management (AMB) (Signed)
  Chronic Care Management   Outreach Note  01/25/2021 Name: Kristen Schroeder MRN: [id number] DOB: 1941/04/07  Kristen Schroeder is a 80 y.o. year old female who is a primary care patient of Maple Hudson., MD. I reached out to Kristen Schroeder by phone today in response to a referral sent by Ms. Natalia L Mccort's PCP, Maple Hudson., MD     A second unsuccessful telephone outreach was attempted today. The patient was referred to the case management team for assistance with care management and care coordination.   Follow Up Plan: A HIPAA compliant phone message was left for the patient providing contact information and requesting a return call.  The care management team will reach out to the patient again over the next 7 days.  If patient returns call to provider office, please advise to call Embedded Care Management Care Guide Avie Arenas at [phone]  Lamiah Marmol [facility] Guide, Embedded Care Coordination  [facility]  Care Management

## 2021-01-25 NOTE — Telephone Encounter (Signed)
Copied from CRM [id number]. Topic: General - Call Back - No Documentation >> Jan 25, 2021 12:03 PM Randol Kern wrote: Best contact: [phone] Boneta Lucks calling from [facility], calling to confirm [facility] Prior Authorization

## 2021-01-26 ENCOUNTER — Ambulatory Visit: Payer: Medicare Other | Admitting: Family Medicine

## 2021-01-26 ENCOUNTER — Telehealth: Payer: Self-pay

## 2021-01-26 NOTE — Telephone Encounter (Signed)
   Telephone encounter was:  Successful.  01/26/2021 Name: Kristen Schroeder MRN: [id number] DOB: 07-29-1941  [name] is a 80 y.o. year old female who is a primary care patient of Maple Hudson., MD . The community resource team was consulted for assistance with wheelchair  Care guide performed the following interventions: Patient provided with information about care guide support team and interviewed to confirm resource needs Discussed resources to assist with getting wheelchair Spoke with patient she stated that she does not need a wheel chair ramp to get in and out of her home. It is a flat surface with sliding doors.  She needed to know where she could take the order for her wheelchair.  [facility]boricult[facility]ist in [place] accepts Medicare assignment [phone], [address]  Follow Up Plan:  No further follow up planned at this time. The patient has been provided with needed resources.  Ashten Prats, AAS Paralegal, [facility] Care Guide . Embedded Care Coordination [facility]  Care Management  [address] [place], [place] [address] ??[email or web address]  ?? [phone]   [email or web address]

## 2021-01-30 DIAGNOSIS — J449 Chronic obstructive pulmonary disease, unspecified: Secondary | ICD-10-CM | POA: Diagnosis not present

## 2021-01-30 NOTE — Chronic Care Management (AMB) (Signed)
Follow up plan:  Chronic Care Management   Note  01/30/2021 Name: Kristen Schroeder MRN: [id number] DOB: 1940/12/03  Kristen Schroeder is a 80 y.o. year old female who is a primary care patient of Jerrol Banana., MD. I reached out to Kristen Schroeder by phone today in response to a referral sent by [name] PCP, Jerrol Banana, MD     [name] was given information about Chronic Care Management services today including:  1. CCM service includes personalized support from designated clinical staff supervised by her physician, including individualized plan of care and coordination with other care providers 2. 24/7 contact phone numbers for assistance for urgent and routine care needs. 3. Service will only be billed when office clinical staff spend 20 minutes or more in a month to coordinate care. 4. Only one practitioner may furnish and bill the service in a calendar month. 5. The patient may stop CCM services at any time (effective at the end of the month) by phone call to the office staff. 6. The patient will be responsible for cost sharing (co-pay) of up to 20% of the service fee (after annual deductible is met).  Patient agreed to services and verbal consent obtained.   Follow up plan: Telephone appointment with care management team member scheduled for: 02/03/2021  [facility]

## 2021-01-30 NOTE — Chronic Care Management (AMB) (Deleted)
  Chronic Care Management   Note  01/30/2021 Name: OLA RAAP MRN: [id number] DOB: [birth year]  [name] is a 80 y.o. year old female who is a primary care patient of Jerrol Banana., MD. I reached out to [name] by phone today in response to a referral sent by Ms. Sarha L Bufkin's PCP, Jerrol Banana., MD     Ms. Kutsch was given information about Chronic Care Management services today including:  1. CCM service includes personalized support from designated clinical staff supervised by her physician, including individualized plan of care and coordination with other care providers 2. 24/7 contact phone numbers for assistance for urgent and routine care needs. 3. Service will only be billed when office clinical staff spend 20 minutes or more in a month to coordinate care. 4. Only one practitioner may furnish and bill the service in a calendar month. 5. The patient may stop CCM services at any time (effective at the end of the month) by phone call to the office staff. 6. The patient will be responsible for cost sharing (co-pay) of up to 20% of the service fee (after annual deductible is met).  Patient agreed to services and verbal consent obtained.   Follow up plan: Telephone appointment with care management team member scheduled for: 02/03/2021   [place] [facility]

## 2021-01-31 ENCOUNTER — Telehealth: Payer: Self-pay

## 2021-01-31 NOTE — Telephone Encounter (Signed)
Faxed order for portable oxygen to [employer].  Called and spoke to Chalmers Cater from [place] about order and he will get in touch with me to let me know if any problems

## 2021-02-03 ENCOUNTER — Ambulatory Visit (INDEPENDENT_AMBULATORY_CARE_PROVIDER_SITE_OTHER): Payer: Medicare Other

## 2021-02-03 DIAGNOSIS — E039 Hypothyroidism, unspecified: Secondary | ICD-10-CM

## 2021-02-03 DIAGNOSIS — E7849 Other hyperlipidemia: Secondary | ICD-10-CM

## 2021-02-03 DIAGNOSIS — J410 Simple chronic bronchitis: Secondary | ICD-10-CM

## 2021-02-03 DIAGNOSIS — F325 Major depressive disorder, single episode, in full remission: Secondary | ICD-10-CM

## 2021-02-03 DIAGNOSIS — I10 Essential (primary) hypertension: Secondary | ICD-10-CM

## 2021-02-03 DIAGNOSIS — I5032 Chronic diastolic (congestive) heart failure: Secondary | ICD-10-CM

## 2021-02-03 NOTE — Progress Notes (Signed)
Chronic Care Management Pharmacy Note  02/08/2021 Name:  Kristen Schroeder MRN:  [id number] DOB:  29-Sep-1941  Subjective: Kristen Schroeder is an 80 y.o. year old female who is a primary patient of Jerrol Banana., MD.  The CCM team was consulted for assistance with disease management and care coordination needs.    Engaged with patient by telephone for initial visit in response to provider referral for pharmacy case management and/or care coordination services.   Consent to Services:  The patient was given the following information about Chronic Care Management services today, agreed to services, and gave verbal consent: 1. CCM service includes personalized support from designated clinical staff supervised by the primary care provider, including individualized plan of care and coordination with other care providers 2. 24/7 contact phone numbers for assistance for urgent and routine care needs. 3. Service will only be billed when office clinical staff spend 20 minutes or more in a month to coordinate care. 4. Only one practitioner may furnish and bill the service in a calendar month. 5.The patient may stop CCM services at any time (effective at the end of the month) by phone call to the office staff. 6. The patient will be responsible for cost sharing (co-pay) of up to 20% of the service fee (after annual deductible is met). Patient agreed to services and consent obtained.  Patient Care Team: Jerrol Banana., MD as PCP - General (Family Medicine) Birder Robson, MD as Referring Physician (Ophthalmology) Yolonda Kida, MD as Consulting Physician (Cardiology) Allyne Gee, MD as Consulting Physician (Internal Medicine) Germaine Pomfret, [facility] (Pharmacist)  Recent office visits: 01/18/21: Patient presented to Dr. Rosanna Randy for follow-up. No medication changes made.   Recent consult visits: 12/27/20: Patient presented to Theodoro Grist, NP (Pulmonology)   Hospital visits: None  in previous 6 months  Objective:  Lab Results  Component Value Date   CREATININE 0.43 (L) 06/28/2020   BUN 6 (L) 06/28/2020   GFRNONAA 97 06/28/2020   GFRAA 112 06/28/2020   NA 146 (H) 06/28/2020   K 4.4 06/28/2020   CALCIUM 9.4 06/28/2020   CO2 43 (HH) 06/28/2020   GLUCOSE 100 (H) 06/28/2020    Lab Results  Component Value Date/Time   HGBA1C 4.9 06/28/2020 03:31 PM   HGBA1C 5.2 02/29/2020 02:24 PM   HGBA1C 5.3 03/17/2015 12:00 AM   MICROALBUR 20 04/22/2017 12:22 PM    Last diabetic Eye exam:  Lab Results  Component Value Date/Time   HMDIABEYEEXA No Retinopathy 06/13/2018 12:00 AM    Last diabetic Foot exam: No results found for: HMDIABFOOTEX   Lab Results  Component Value Date   CHOL 183 12/15/2019   HDL 58 12/15/2019   LDLCALC 104 (H) 12/15/2019   TRIG 117 12/15/2019   CHOLHDL 3.2 12/15/2019    Hepatic Function Latest Ref Rng & Units 06/28/2020 06/04/2020 04/28/2020  Total Protein 6.0 - 8.5 g/dL 6.6 7.4 -  Albumin 3.7 - 4.7 g/dL 4.0 3.9 3.8  AST 0 - 40 IU/L 15 39 -  ALT 0 - 32 IU/L 6 12 -  Alk Phosphatase 48 - 121 IU/L 90 73 -  Total Bilirubin 0.0 - 1.2 mg/dL <0.2 0.8 -    Lab Results  Component Value Date/Time   TSH 1.330 06/28/2020 03:46 PM   TSH 0.501 12/15/2019 11:18 AM    CBC Latest Ref Rng & Units 06/28/2020 06/04/2020 04/07/2020  WBC 3.4 - 10.8 x10E3/uL 7.2 15.4(H) 7.6  Hemoglobin 11.1 -  15.9 g/dL 8.5(L) 8.3(L) 8.2(L)  Hematocrit 34.0 - 46.6 % 27.8(L) 30.8(L) 27.4(L)  Platelets 150 - 450 x10E3/uL 180 186 169    No results found for: VD25OH  Clinical ASCVD: No  The 10-year ASCVD risk score Mikey Bussing DC Jr., et al., 2013) is: 39.7%   Values used to calculate the score:     Age: 89 years     Sex: Female     Is Non-Hispanic African American: Yes     Diabetic: Yes     Tobacco smoker: No     Systolic Blood Pressure: 627 mmHg     Is BP treated: Yes     HDL Cholesterol: 58 mg/dL     Total Cholesterol: 183 mg/dL    Depression screen [facility] 2/9 01/18/2021  07/19/2020 05/18/2020  Decreased Interest 0 0 1  Down, Depressed, Hopeless 0 0 1  PHQ - 2 Score 0 0 2  Altered sleeping 0 - 1  Tired, decreased energy 1 - 1  Change in appetite 0 - 2  Feeling bad or failure about yourself  0 - 0  Trouble concentrating 0 - 0  Moving slowly or fidgety/restless 0 - 0  Suicidal thoughts 0 - 0  PHQ-9 Score 1 - 6  Difficult doing work/chores Not difficult at all - Not difficult at all  Some recent data might be hidden    Social History   Tobacco Use  Smoking Status Former Research scientist (life sciences)  . Packs/day: 0.50  . Years: 15.00  . Pack years: 7.50  Smokeless Tobacco Never Used  Tobacco Comment   30-40 years ago   BP Readings from Last 3 Encounters:  01/18/21 (!) 138/58  12/27/20 (!) 142/88  10/27/20 135/90   Pulse Readings from Last 3 Encounters:  01/18/21 80  12/27/20 (!) 110  10/27/20 79   Wt Readings from Last 3 Encounters:  01/18/21 210 lb (95.3 kg)  12/27/20 211 lb 9.6 oz (96 kg)  10/27/20 204 lb (92.5 kg)   BMI Readings from Last 3 Encounters:  01/18/21 33.89 kg/m  12/27/20 33.14 kg/m  10/27/20 31.95 kg/m    Assessment/Interventions: Review of patient past medical history, allergies, medications, health status, including review of consultants reports, laboratory and other test data, was performed as part of comprehensive evaluation and provision of chronic care management services.   SDOH:  (Social Determinants of Health) assessments and interventions performed: Yes SDOH Interventions   Flowsheet Row Most Recent Value  SDOH Interventions   Financial Strain Interventions Intervention Not Indicated     SDOH Screenings   Alcohol Screen: Not on file  Depression (PHQ2-9): Low Risk   . PHQ-2 Score: 1  Financial Resource Strain: Low Risk   . Difficulty of Paying Living Expenses: Not hard at all  Food Insecurity: Not on file  Housing: Not on file  Physical Activity: Not on file  Social Connections: Not on file  Stress: Not on file   Tobacco Use: Medium Risk  . Smoking Tobacco Use: Former Smoker  . Smokeless Tobacco Use: Never Used  Transportation Needs: Not on file    CCM Care Plan  Allergies  Allergen Reactions  . Codeine Nausea And Vomiting and Nausea Only    Medications Reviewed Today    Reviewed by Luiz Ochoa, NP (Nurse Practitioner) on 12/29/20 at 1716  Med List Status: <None>  Medication Order Taking? Sig Documenting Provider Last Dose Status Informant  arformoterol (BROVANA) 15 MCG/2ML NEBU [id number] No Take 2 mLs (15 mcg total) by nebulization 2 (  two) times daily. Jerrol Banana., MD Taking Active Self           Med Note [facility], [facility] A   Thu Dec 10, 2019  9:04 AM)    aspirin EC 81 MG tablet [id number] No Take 81 mg by mouth daily. [provider] Taking Active Self  azithromycin (ZITHROMAX) 250 MG tablet [id number]  TAKE 2 TABLETS BY MOUTH TODAY, THEN TAKE 1 TABLET DAILY FOR 4 DAYS Jerrol Banana., MD  Active   blood glucose meter kit and supplies KIT [id number] No Dispense based on patient and insurance preference. Use up to four times daily as directed. (FOR ICD-9 250.00, 250.01). Jerrol Banana., MD Taking Active Self  Blood Glucose Monitoring Suppl (ONE TOUCH ULTRA 2) w/Device KIT [id number] No 1 each by Does not apply route daily. Jerrol Banana., MD Taking Active Self  cetirizine (ZYRTEC) 10 MG tablet [id number]  Take 1 tablet (10 mg total) by mouth daily. Jerrol Banana., MD  Active   enalapril (VASOTEC) 20 MG tablet [id number] No TAKE 1 TABLET BY MOUTH EVERY DAY Jerrol Banana., MD Taking Active   fluticasone [facility]) 50 MCG/ACT nasal spray [id number] No Place 1 spray into both nostrils daily as needed.  [provider] Taking Active Self  furosemide (LASIX) 20 MG tablet [id number] No Take 2 tablets (40 mg total) by mouth daily. Jerrol Banana., MD Taking Active   gabapentin (NEURONTIN) 300 MG capsule [id number]  TAKE 2 CAPSULES  IN THE MORNING, 1 CAPSULE IN THE AFTERNOON AND 2 CAPSULES IN THE EVENING Jerrol Banana., MD  Active   ipratropium-albuterol (DUONEB) 0.5-2.5 (3) MG/3ML SOLN [id number]  INHALE 1 VIAL VIA NEBULIZER BY MOUTH 3 TIMES A DAY AS NEEDED FOR COPD/ASTHMA Lavera Guise, MD  Active   isosorbide mononitrate (IMDUR) 30 MG 24 hr tablet [id number] No Take 30 mg by mouth daily as needed (high bp). [provider] Taking Active Self           Med Note Fabio Neighbors A   Thu Dec 10, 2019  9:04 AM)    levothyroxine (SYNTHROID) 125 MCG tablet [id number] No TAKE 1 TABLET BY MOUTH DAILY Jerrol Banana., MD Taking Active   lisinopril (ZESTRIL) 20 MG tablet [id number] No Take by mouth. [provider] Taking Active   loratadine (CLARITIN) 10 MG tablet [id number] No Take 1 tablet (10 mg total) by mouth daily. Jerrol Banana., MD Taking Active   LORazepam (ATIVAN) 0.5 MG tablet [id number] No Take 1 tablet (0.5 mg total) by mouth 2 (two) times daily as needed for anxiety. Jerrol Banana., MD Taking Active Self           Med Note [facility], [facility] A   Thu Dec 10, 2019  9:04 AM)    meclizine (ANTIVERT) 12.5 MG tablet [id number] No Take 1 tablet (12.5 mg total) by mouth 3 (three) times daily as needed for dizziness. Virginia Crews, MD Taking Active   MULTIPLE VITAMIN PO [id number] No Take 1 tablet by mouth daily.  [provider] Taking Active Self           Med Note Sidonie Dickens   Tue Apr 09, 2017  1:30 PM)    omeprazole (PRILOSEC) 40 MG capsule [id number] No TAKE 1 CAPSULE BY MOUTH EVERY DAY Jerrol Banana., MD Taking Active   [facility] ULTRA test strip [id number] No USE 1 STRIP  TO TEST GLUCOSE TWICE DAILY AS NEEDED Jerrol Banana., MD Taking Active   OXYGEN [id number] No Inhale 4 L into the lungs. [provider] Taking Active Self           Med Note Rollene Rotunda, Clois Dupes   Wed Oct 15, 2018  1:56 PM) Patient uses 3 to 3 1/2 liters continuously   PARoxetine (PAXIL) 10 MG tablet [id number] No Take 1 tablet (10 mg total) by mouth at bedtime. Jerrol Banana., MD Taking Active Self           Med Note [facility], [facility] A   Thu Dec 10, 2019  9:03 AM)    predniSONE (DELTASONE) 20 MG tablet [id number] No Take 1 tablet (20 mg total) by mouth daily with breakfast. Jerrol Banana., MD Taking Active   roflumilast (DALIRESP) 500 MCG TABS tablet [id number] No Take 500 mcg by mouth daily. As needed [provider] Taking Active            Med Note Hassan Buckler, MCKENZIE A   Thu Dec 10, 2019  9:04 AM)    sertraline (ZOLOFT) 50 MG tablet [id number] No Take 1 tablet (50 mg total) by mouth daily. Jerrol Banana., MD Taking Active   tiotropium [facility]) 18 MCG inhalation capsule [id number]  Place 1 capsule (18 mcg total) into inhaler and inhale daily. Luiz Ochoa, NP  Active           Patient Active Problem List   Diagnosis Date Noted  . Depression, major, single episode, complete remission ([place]) 05/20/2020  . Incarcerated hernia of abdominal cavity 08/04/2018  . SBO (small bowel obstruction) ([place]) 04/22/2018  . Incarcerated hernia 04/14/2018  . Aneurysm ([place]) 07/10/2016  . Nonspecific abnormal finding 05/11/2015  . Allergic rhinitis 05/11/2015  . Anxiety 05/11/2015  . Bronchitis, chronic ([place]) 05/11/2015  . CCF (congestive cardiac failure) ([place]) 05/11/2015  . Clinical depression 05/11/2015  . 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 Essential (primary) hypertension 05/11/2015  . Acid reflux 05/11/2015  . HLD (hyperlipidemia) 05/11/2015  . Adult hypothyroidism 05/11/2015  . Cervical dysplasia, mild 05/11/2015  . Neuropathy 05/11/2015  . Adiposity 05/11/2015  . Obstructive apnea 05/11/2015  . Arthritis, degenerative 05/11/2015    Immunization History  Administered Date(s) Administered  . Fluad Quad(high Dose 65+) 07/09/2019, 07/27/2020  . Influenza Split 07/23/2012  . Influenza, High Dose  Seasonal PF 08/24/2014, 08/18/2015, 08/28/2016, 07/11/2017, 09/24/2018  . Influenza,inj,Quad PF,6+ Mos 07/29/2013  . PFIZER(Purple Top)SARS-COV-2 Vaccination 12/20/2019, 01/12/2020, 10/27/2020  . Pneumococcal Conjugate-13 08/24/2014  . Pneumococcal Polysaccharide-23 11/03/1999, 07/23/2012  . Tdap 07/23/2012  . Zoster 07/23/2012    Conditions to be addressed/monitored:  Hypertension, Hyperlipidemia, Heart Failure, COPD, Hypothyroidism, Depression and Anxiety  Care Plan : General Pharmacy (Adult)  Updates made by Germaine Pomfret, RPH since 02/08/2021 12:00 AM    Problem: Hypertension, Hyperlipidemia, Heart Failure, COPD, Hypothyroidism, Depression and Anxiety   Priority: High    Long-Range Goal: Patient-Specific Goal   Start Date: 02/03/2021  Expected End Date: 08/05/2021  This Visit's Progress: On track  Priority: High  Note:   Current Barriers:  . Unable to self administer medications as prescribed  Pharmacist Clinical Goal(s):  Marland Kitchen Patient will maintain control of blood pressure as evidenced by BP less than 140/90  through collaboration with PharmD and provider.   Interventions: . 1:1 collaboration with Jerrol Banana., MD regarding development and update of comprehensive plan of care as evidenced by provider attestation  and co-signature . Inter-disciplinary care team collaboration (see longitudinal plan of care) . Comprehensive medication review performed; medication list updated in electronic medical record  Heart Failure (Goal: manage symptoms and prevent exacerbations) -Controlled -Last ejection fraction: NA (Date: NA) -HF type: Diastolic -NYHA Class: II (slight limitation of activity) -AHA HF Stage: C (Heart disease and symptoms present) -Current treatment: . Enalapril 20 mg daily  . Furosemide 20 mg 2 tablets daily  . Imdur 30 mg daily -Medications previously tried: NA  -Current home BP/HR readings: 130-135/60-70  -Educated on Importance of weighing daily; if  you gain more than 3 pounds in one day or 5 pounds in one week, contact provider Importance of blood pressure control -Recommended to continue current medication  COPD (Goal: control symptoms and prevent exacerbations) -Controlled -Current treatment   . DuoNeb Soln three times daily as needed  . Dulera two puffs twice daily   -Medications previously tried: NA  -Gold Grade: Gold 4 (FEV1<40%) -Current COPD Classification:  D (high sx, >/=2 exacerbations/yr) -MMRC/CAT score: NA -Pulmonary function testing: 39% -Exacerbations requiring treatment in last 6 months: Yes, two courses of azithromycin  -Patient reports consistent use of maintenance inhaler -Counseled on Benefits of consistent maintenance inhaler use -Recommended to continue current medication  Depression/Anxiety (Goal: maintain stable mood) -Controlled -Current treatment: . Sertraline 50 mg daily  -Medications previously tried/failed: NA -PHQ9: 1 -GAD7: 3 -Connected with None for mental health support -Educated on Benefits of cognitive-behavioral therapy with or without medication. Patient was given information for [facility]  -Patient reported only taking sertraline as needed for symptoms. Counseled patient on benefits of daily administration of sertraline for mood. -Recommended to continue current medication  GERD (Goal: Prevent heartburn/reflux) -Controlled -Current treatment  . Omeprazole 40 mg daily  -Medications previously tried: NA  -Counseled on trigger avoidance, avoiding laying down after meals. -Recommended to continue current medication  Allergic Rhinitis (Goal: minimize allergy symptoms) -Not ideally controlled -Current treatment  . Cetirizine 10 mg daily  . Flonase 1 spray daily PRN  -Medications previously tried: NA  -Symptoms have been worsening lately due to seasonal pollen. -Counseled on allergen avoidance, keeping windows closed, cleaning sheets regularly, and wearing a mask when  outdoors on high pollen days -Recommended to continue current medication  Patient Goals/Self-Care Activities . Patient will:  - check blood pressure 2-3 times weekly, document, and provide at future appointments  Follow Up Plan: Telephone follow up appointment with care management team member scheduled for:  08/07/2021 at 2:00 PM      Medication Assistance: None required.  Patient affirms current coverage meets needs.  Patient's preferred pharmacy is:  [facility] - [place], [place] - [address] [place] [address] Phone: [phone] Fax: [phone]  Uses pill box? No - doesn't need Pt endorses 60% compliance  We discussed: Verbal consent obtained for [facility] enhanced pharmacy services (medication synchronization, adherence packaging, delivery coordination). A medication sync plan was created to allow patient to get all medications delivered once every 30 to 90 days per patient preference. Patient understands they have freedom to choose pharmacy and clinical pharmacist will coordinate care between all prescribers and [facility].  Patient decided to: Utilize [facility] for medication synchronization, packaging and delivery  Care Plan and Follow Up Patient Decision:  Patient agrees to Care Plan and Follow-up.  Plan: Telephone follow up appointment with care management team member scheduled for:  08/07/2021 at 2:00 PM  Junius Argyle,  PharmD, [place] [phone]

## 2021-02-04 ENCOUNTER — Other Ambulatory Visit: Payer: Self-pay | Admitting: Family Medicine

## 2021-02-04 DIAGNOSIS — J309 Allergic rhinitis, unspecified: Secondary | ICD-10-CM

## 2021-02-08 NOTE — Patient Instructions (Signed)
Visit Information It was great speaking with you today!  Please let me know if you have any questions about our visit.  Goals Addressed            This Visit's Progress   . Track and Manage Fluids and Swelling-Heart Failure       Timeframe:  Long-Range Goal Priority:  High Start Date:  02/03/2021                           Expected End Date:   08/05/2021                    Follow Up Date 05/04/2021   - call office if I gain more than 2 pounds in one day or 5 pounds in one week - track weight in diary - use salt in moderation - watch for swelling in feet, ankles and legs every day - weigh myself daily    Why is this important?    It is important to check your weight daily and watch how much salt and liquids you have.   It will help you to manage your heart failure.    Notes:        Patient Care Plan: General Pharmacy (Adult)    Problem Identified: Hypertension, Hyperlipidemia, Heart Failure, COPD, Hypothyroidism, Depression and Anxiety   Priority: High    Long-Range Goal: Patient-Specific Goal   Start Date: 02/03/2021  Expected End Date: 08/05/2021  This Visit's Progress: On track  Priority: High  Note:   Current Barriers:  . Unable to self administer medications as prescribed  Pharmacist Clinical Goal(s):  Marland Kitchen Patient will maintain control of blood pressure as evidenced by BP less than 140/90  through collaboration with PharmD and provider.   Interventions: . 1:1 collaboration with Maple Hudson., MD regarding development and update of comprehensive plan of care as evidenced by provider attestation and co-signature . Inter-disciplinary care team collaboration (see longitudinal plan of care) . Comprehensive medication review performed; medication list updated in electronic medical record  Heart Failure (Goal: manage symptoms and prevent exacerbations) -Controlled -Last ejection fraction: NA (Date: NA) -HF type: Diastolic -NYHA Class: II (slight limitation of  activity) -AHA HF Stage: C (Heart disease and symptoms present) -Current treatment: . Enalapril 20 mg daily  . Furosemide 20 mg 2 tablets daily  . Imdur 30 mg daily -Medications previously tried: NA  -Current home BP/HR readings: 130-135/60-70  -Educated on Importance of weighing daily; if you gain more than 3 pounds in one day or 5 pounds in one week, contact provider Importance of blood pressure control -Recommended to continue current medication  COPD (Goal: control symptoms and prevent exacerbations) -Controlled -Current treatment   . DuoNeb Soln three times daily as needed  . Dulera two puffs twice daily   -Medications previously tried: NA  -Gold Grade: Gold 4 (FEV1<40%) -Current COPD Classification:  D (high sx, >/=2 exacerbations/yr) -MMRC/CAT score: NA -Pulmonary function testing: 39% -Exacerbations requiring treatment in last 6 months: Yes, two courses of azithromycin  -Patient reports consistent use of maintenance inhaler -Counseled on Benefits of consistent maintenance inhaler use -Recommended to continue current medication  Depression/Anxiety (Goal: maintain stable mood) -Controlled -Current treatment: . Sertraline 50 mg daily  -Medications previously tried/failed: NA -PHQ9: 1 -GAD7: 3 -Connected with None for mental health support -Educated on Benefits of cognitive-behavioral therapy with or without medication. Patient was given information for [place] [facility]  -Patient reported  only taking sertraline as needed for symptoms. Counseled patient on benefits of daily administration of sertraline for mood. -Recommended to continue current medication  GERD (Goal: Prevent heartburn/reflux) -Controlled -Current treatment  . Omeprazole 40 mg daily  -Medications previously tried: NA  -Counseled on trigger avoidance, avoiding laying down after meals. -Recommended to continue current medication  Allergic Rhinitis (Goal: minimize allergy symptoms) -Not  ideally controlled -Current treatment  . Cetirizine 10 mg daily  . Flonase 1 spray daily PRN  -Medications previously tried: NA  -Symptoms have been worsening lately due to seasonal pollen. -Counseled on allergen avoidance, keeping windows closed, cleaning sheets regularly, and wearing a mask when outdoors on high pollen days -Recommended to continue current medication  Patient Goals/Self-Care Activities . Patient will:  - check blood pressure 2-3 times weekly, document, and provide at future appointments  Follow Up Plan: Telephone follow up appointment with care management team member scheduled for:  08/07/2021 at 2:00 PM    Ms. Kolton was given information about Chronic Care Management services today including:  1. CCM service includes personalized support from designated clinical staff supervised by her physician, including individualized plan of care and coordination with other care providers 2. 24/7 contact phone numbers for assistance for urgent and routine care needs. 3. Standard insurance, coinsurance, copays and deductibles apply for chronic care management only during months in which we provide at least 20 minutes of these services. Most insurances cover these services at 100%, however patients may be responsible for any copay, coinsurance and/or deductible if applicable. This service may help you avoid the need for more expensive face-to-face services. 4. Only one practitioner may furnish and bill the service in a calendar month. 5. The patient may stop CCM services at any time (effective at the end of the month) by phone call to the office staff.  Verbal consent obtained for [facility] enhanced pharmacy services (medication synchronization, adherence packaging, delivery coordination). A medication sync plan was created to allow patient to get all medications delivered once every 30 to 90 days per patient preference. Patient understands they have freedom to choose pharmacy and  clinical pharmacist will coordinate care between all prescribers and [facility].  Patient agreed to services and verbal consent obtained.   The patient verbalized understanding of instructions, educational materials, and care plan provided today and declined offer to receive copy of patient instructions, educational materials, and care plan.   Angelena Sole, PharmD, CPP Clinical Pharmacist [facility] [phone]

## 2021-02-09 ENCOUNTER — Telehealth: Payer: Self-pay

## 2021-02-09 NOTE — Telephone Encounter (Signed)
Copied from CRM [id number]. Topic: General - Call Back - No Documentation >> Feb 09, 2021 12:19 PM Aretta Nip wrote: Reason for CRM: *Please Fu with pt as she needs some info re her wheelchair states that they need information from Dr G to continue to process and pt needs the wheelchair. [phone]

## 2021-02-09 NOTE — Telephone Encounter (Signed)
Forms need Dr Reece Agar signature then will fax.

## 2021-02-09 NOTE — Progress Notes (Addendum)
Chronic Care Management Pharmacy Assistant   Name: Kristen Schroeder  MRN: [id number] DOB: 21-Jul-1941  Reason for Encounter: Medication Review/Onboarding Form   Recent office visits:  None ID  Recent consult visits:  None ID  Hospital visits:  None in previous 6 months  Medications: Outpatient Encounter Medications as of 02/09/2021  Medication Sig Note  . acetaminophen (TYLENOL) 500 MG tablet Take 500 mg by mouth every 6 (six) hours as needed.   Marland Kitchen aspirin EC 81 MG tablet Take 81 mg by mouth daily.   . blood glucose meter kit and supplies KIT Dispense based on patient and insurance preference. Use up to four times daily as directed. (FOR ICD-9 250.00, 250.01).   . Blood Glucose Monitoring Suppl (ONE TOUCH ULTRA 2) w/Device KIT 1 each by Does not apply route daily.   . cetirizine (ZYRTEC) 10 MG tablet Take 1 tablet (10 mg total) by mouth daily.   . enalapril (VASOTEC) 20 MG tablet TAKE 1 TABLET BY MOUTH EVERY DAY   . furosemide (LASIX) 20 MG tablet Take 2 tablets (40 mg total) by mouth daily. (Patient taking differently: Take 20 mg by mouth daily.)   . gabapentin (NEURONTIN) 300 MG capsule TAKE 2 CAPSULES IN THE MORNING, 1 CAPSULE IN THE AFTERNOON AND 2 CAPSULES IN THE EVENING   . ipratropium-albuterol (DUONEB) 0.5-2.5 (3) MG/3ML SOLN INHALE 1 VIAL VIA NEBULIZER BY MOUTH 3 TIMES A DAY AS NEEDED FOR COPD/ASTHMA   . isosorbide mononitrate (IMDUR) 30 MG 24 hr tablet Take 30 mg by mouth daily as needed (high bp). (Patient not taking: Reported on 02/03/2021)   . levothyroxine (SYNTHROID) 125 MCG tablet TAKE 1 TABLET BY MOUTH DAILY   . meclizine (ANTIVERT) 12.5 MG tablet Take 1 tablet (12.5 mg total) by mouth 3 (three) times daily as needed for dizziness. (Patient not taking: Reported on 02/03/2021)   . mometasone-formoterol (DULERA) 100-5 MCG/ACT AERO Inhale 2 puffs into the lungs 2 (two) times daily.   . MULTIPLE VITAMIN PO Take 1 tablet by mouth daily.    Marland Kitchen omeprazole (PRILOSEC) 40 MG capsule  TAKE 1 CAPSULE BY MOUTH EVERY DAY   . ONETOUCH ULTRA test strip USE 1 STRIP TO TEST GLUCOSE TWICE DAILY AS NEEDED   . OXYGEN Inhale 4 L into the lungs. 10/15/2018: Patient uses 3 to 3 1/2 liters continuously  . phenylephrine (NEO-SYNEPHRINE) 1 % nasal spray Place 1 drop into both nostrils every 6 (six) hours as needed for congestion.   . sertraline (ZOLOFT) 50 MG tablet Take 1 tablet (50 mg total) by mouth daily. (Patient taking differently: Take 50 mg by mouth daily as needed.)   . tiotropium (SPIRIVA) 18 MCG inhalation capsule Place 1 capsule (18 mcg total) into inhaler and inhale daily. (Patient not taking: Reported on 02/03/2021)    No facility-administered encounter medications on file as of 02/09/2021.    Star Rating Drugs: Enalapril 20 mg last filled on [facility]/06/2021 [facility]  Reached out to [place] to request a profile transfer on 02/09/2021 to go to [employer].  Reached out to [place] to get a update on the profile transfer that was requested on 02/09/2021.Per [facility] he is not sure what happen to the request but will fax the profile in the next few minutes [facility].   Reached out to Dr. Clayborn Bigness to request a refill of Duo Neb on 02/10/2021 to go to [place] out to Cardiology to  request a refill of IMDUR on 02/10/2021 [facility].(on hold Cardiology for 8 mins)  Completed Onboarding form and sent to clinical pharmacist for review.  [place] Pharmacist Assistant [phone]

## 2021-02-10 ENCOUNTER — Other Ambulatory Visit: Payer: Self-pay

## 2021-02-10 MED ORDER — IPRATROPIUM-ALBUTEROL 0.5-2.5 (3) MG/3ML IN SOLN: 3.0000 mL | Freq: Four times a day (QID) | RESPIRATORY_TRACT | 1 refills | Status: DC | PRN

## 2021-02-12 DIAGNOSIS — J449 Chronic obstructive pulmonary disease, unspecified: Secondary | ICD-10-CM | POA: Diagnosis not present

## 2021-02-14 ENCOUNTER — Other Ambulatory Visit: Payer: Self-pay | Admitting: *Deleted

## 2021-02-14 DIAGNOSIS — G629 Polyneuropathy, unspecified: Secondary | ICD-10-CM

## 2021-02-14 DIAGNOSIS — F329 Major depressive disorder, single episode, unspecified: Secondary | ICD-10-CM

## 2021-02-14 DIAGNOSIS — F419 Anxiety disorder, unspecified: Secondary | ICD-10-CM

## 2021-02-14 DIAGNOSIS — R600 Localized edema: Secondary | ICD-10-CM

## 2021-02-14 DIAGNOSIS — K219 Gastro-esophageal reflux disease without esophagitis: Secondary | ICD-10-CM

## 2021-02-14 MED ORDER — ENALAPRIL MALEATE 20 MG PO TABS: 20.0000 mg | ORAL_TABLET | Freq: Every day | ORAL | 1 refills | Status: DC

## 2021-02-14 MED ORDER — FUROSEMIDE 20 MG PO TABS: 40.0000 mg | ORAL_TABLET | Freq: Every day | ORAL | 1 refills | Status: DC

## 2021-02-14 MED ORDER — SERTRALINE HCL 50 MG PO TABS: 50.0000 mg | ORAL_TABLET | Freq: Every day | ORAL | 3 refills | Status: DC

## 2021-02-14 MED ORDER — GABAPENTIN 300 MG PO CAPS: ORAL_CAPSULE | ORAL | 3 refills | Status: AC

## 2021-02-14 MED ORDER — OMEPRAZOLE 40 MG PO CPDR: DELAYED_RELEASE_CAPSULE | ORAL | 1 refills | Status: DC

## 2021-02-21 NOTE — Telephone Encounter (Signed)
Kristen Schroeder, from [facility], calling stating that they are still waiting on the progress notes and the prescription for the pts wheelchair. She states that this was supposed to be from the mobility appt from 01/26/21. Please advise.     Reference# [id number]   Callback# [phone]   Fax# [phone]

## 2021-02-21 NOTE — Telephone Encounter (Signed)
Gina advised. 

## 2021-02-21 NOTE — Telephone Encounter (Signed)
We have talked on the last visit about patient trying the manual wheelchair at this time.

## 2021-03-02 DIAGNOSIS — J449 Chronic obstructive pulmonary disease, unspecified: Secondary | ICD-10-CM | POA: Diagnosis not present

## 2021-03-13 ENCOUNTER — Other Ambulatory Visit: Payer: Self-pay | Admitting: Family Medicine

## 2021-03-13 DIAGNOSIS — R059 Cough, unspecified: Secondary | ICD-10-CM

## 2021-03-13 NOTE — Telephone Encounter (Signed)
Requested medication (s) are due for refill today:   No  Requested medication (s) are on the active medication list:   No  Future visit scheduled:   Yes   Last ordered: 02/03/2021 it was discontinued.  Therapy completed   Requested Prescriptions  Pending Prescriptions Disp Refills   azithromycin (ZITHROMAX) 250 MG tablet [Pharmacy Med Name: AZITHROMYCIN 250 MG TABLET] 6 tablet 1    Sig: TAKE 2 TABLETS BY MOUTH TODAY, THEN TAKE 1 TABLET DAILY FOR 4 DAYS      Off-Protocol Failed - 03/13/2021  2:28 PM      Failed - Medication not assigned to a protocol, review manually.      Passed - Valid encounter within last 12 months    Recent Outpatient Visits           1 month ago Chronic respiratory failure with hypoxia and hypercapnia [facility])   [facility] Maple Hudson., MD   2 months ago Allergic rhinitis, unspecified seasonality, unspecified trigger   [facility] Maple Hudson., MD   4 months ago Pulmonary fibrosis [facility])   [facility] Maple Hudson., MD   7 months ago Urinary frequency   [facility] Maple Hudson., MD   7 months ago Weight loss   [facility] Maple Hudson., MD       Future Appointments             In 1 week Maple Hudson., MD [facility]

## 2021-03-14 DIAGNOSIS — J449 Chronic obstructive pulmonary disease, unspecified: Secondary | ICD-10-CM | POA: Diagnosis not present

## 2021-03-21 ENCOUNTER — Encounter: Payer: Self-pay | Admitting: Family Medicine

## 2021-03-21 ENCOUNTER — Other Ambulatory Visit: Payer: Self-pay

## 2021-03-21 ENCOUNTER — Ambulatory Visit (INDEPENDENT_AMBULATORY_CARE_PROVIDER_SITE_OTHER): Payer: Medicare Other | Admitting: Family Medicine

## 2021-03-21 VITALS — BP 134/79 | HR 83 | Temp 98.1°F | Resp 18 | Ht 66.0 in | Wt 208.0 lb

## 2021-03-21 DIAGNOSIS — I509 Heart failure, unspecified: Secondary | ICD-10-CM

## 2021-03-21 DIAGNOSIS — Z9981 Dependence on supplemental oxygen: Secondary | ICD-10-CM | POA: Diagnosis not present

## 2021-03-21 DIAGNOSIS — I10 Essential (primary) hypertension: Secondary | ICD-10-CM | POA: Diagnosis not present

## 2021-03-21 DIAGNOSIS — E113299 Type 2 diabetes mellitus with mild nonproliferative diabetic retinopathy without macular edema, unspecified eye: Secondary | ICD-10-CM

## 2021-03-21 DIAGNOSIS — G4733 Obstructive sleep apnea (adult) (pediatric): Secondary | ICD-10-CM

## 2021-03-21 NOTE — Progress Notes (Signed)
I,April Miller,acting as a scribe for Wilhemena Durie, MD.,have documented all relevant documentation on the behalf of Wilhemena Durie, MD,as directed by  Wilhemena Durie, MD while in the presence of Wilhemena Durie, MD.  Established patient visit   Patient: Kristen Schroeder   DOB: 01-08-1941   80 y.o. Female  MRN: [id number] Visit Date: 03/21/2021  Today's healthcare provider: Wilhemena Durie, MD   Chief Complaint  Patient presents with  . Follow-up  . Hypertension   Subjective    HPI  Patient comes today for follow-up.  Everything is relatively stable. She wears oxygen 24/7 by nasal cannula Hypertension, follow-up  BP Readings from Last 3 Encounters:  03/21/21 134/79  01/18/21 (!) 138/58  12/27/20 (!) 142/88   Wt Readings from Last 3 Encounters:  03/21/21 208 lb (94.3 kg)  01/18/21 210 lb (95.3 kg)  12/27/20 211 lb 9.6 oz (96 kg)     She was last seen for hypertension 2 months ago.  BP at that visit was 138/58. Management since that visit includes; on enalapril and isosorbide mononitrate. She reports good compliance with treatment. She is not having side effects. none She is not exercising. She is adherent to low salt diet.   Outside blood pressures are 158/82.  She does not smoke.  Use of agents associated with hypertension: none.   --------------------------------------------------------------------      Medications: Outpatient Medications Prior to Visit  Medication Sig  . acetaminophen (TYLENOL) 500 MG tablet Take 500 mg by mouth every 6 (six) hours as needed.  Marland Kitchen aspirin EC 81 MG tablet Take 81 mg by mouth daily.  Marland Kitchen azithromycin (ZITHROMAX) 250 MG tablet TAKE 2 TABLETS BY MOUTH TODAY, THEN TAKE 1 TABLET DAILY FOR 4 DAYS  . blood glucose meter kit and supplies KIT Dispense based on patient and insurance preference. Use up to four times daily as directed. (FOR ICD-9 250.00, 250.01).  . Blood Glucose Monitoring Suppl (ONE TOUCH ULTRA 2)  w/Device KIT 1 each by Does not apply route daily.  . cetirizine (ZYRTEC) 10 MG tablet Take 1 tablet (10 mg total) by mouth daily.  . enalapril (VASOTEC) 20 MG tablet Take 1 tablet (20 mg total) by mouth daily.  . furosemide (LASIX) 20 MG tablet Take 2 tablets (40 mg total) by mouth daily.  Marland Kitchen gabapentin (NEURONTIN) 300 MG capsule TAKE 2 CAPSULES IN THE MORNING, 1 CAPSULE IN THE AFTERNOON AND 2 CAPSULES IN THE EVENING  . ipratropium-albuterol (DUONEB) 0.5-2.5 (3) MG/3ML SOLN Take 3 mLs by nebulization every 6 (six) hours as needed.  Marland Kitchen levothyroxine (SYNTHROID) 125 MCG tablet TAKE 1 TABLET BY MOUTH DAILY  . mometasone-formoterol (DULERA) 100-5 MCG/ACT AERO Inhale 2 puffs into the lungs 2 (two) times daily.  . MULTIPLE VITAMIN PO Take 1 tablet by mouth daily.   Marland Kitchen omeprazole (PRILOSEC) 40 MG capsule TAKE 1 CAPSULE BY MOUTH EVERY DAY  . ONETOUCH ULTRA test strip USE 1 STRIP TO TEST GLUCOSE TWICE DAILY AS NEEDED  . OXYGEN Inhale 4 L into the lungs.  . phenylephrine (NEO-SYNEPHRINE) 1 % nasal spray Place 1 drop into both nostrils every 6 (six) hours as needed for congestion.  . sertraline (ZOLOFT) 50 MG tablet Take 1 tablet (50 mg total) by mouth daily.  . isosorbide mononitrate (IMDUR) 30 MG 24 hr tablet Take 30 mg by mouth daily as needed (high bp). (Patient not taking: No sig reported)  . meclizine (ANTIVERT) 12.5 MG tablet Take 1 tablet (  12.5 mg total) by mouth 3 (three) times daily as needed for dizziness. (Patient not taking: No sig reported)  . tiotropium (SPIRIVA) 18 MCG inhalation capsule Place 1 capsule (18 mcg total) into inhaler and inhale daily. (Patient not taking: No sig reported)   No facility-administered medications prior to visit.    Review of Systems  Constitutional: Negative for activity change and fatigue.  Cardiovascular: Negative for chest pain, palpitations and leg swelling.  Neurological: Negative for dizziness, light-headedness and headaches.  Psychiatric/Behavioral:  Negative for agitation, self-injury, sleep disturbance and suicidal ideas. The patient is not nervous/anxious.         Objective    BP 134/79 (BP Location: Left Arm, Patient Position: Sitting, Cuff Size: Large)   Pulse 83   Temp 98.1 F (36.7 C) (Oral)   Resp 18   Ht '5\' 6"'  (1.676 m)   Wt 208 lb (94.3 kg)   SpO2 95%   BMI 33.57 kg/m  BP Readings from Last 3 Encounters:  03/21/21 134/79  01/18/21 (!) 138/58  12/27/20 (!) 142/88   Wt Readings from Last 3 Encounters:  03/21/21 208 lb (94.3 kg)  01/18/21 210 lb (95.3 kg)  12/27/20 211 lb 9.6 oz (96 kg)       Physical Exam Vitals and nursing note reviewed.  Constitutional:      Appearance: Normal appearance. She is normal weight.  HENT:     Right Ear: Tympanic membrane normal.     Left Ear: Tympanic membrane normal.     Nose: Nose normal.     Mouth/Throat:     Mouth: Mucous membranes are moist.     Pharynx: Oropharynx is clear.  Eyes:     General: No scleral icterus.    Conjunctiva/sclera: Conjunctivae normal.  Cardiovascular:     Rate and Rhythm: Normal rate and regular rhythm.     Pulses: Normal pulses.     Heart sounds: Normal heart sounds.  Pulmonary:     Effort: Pulmonary effort is normal.     Breath sounds: Normal breath sounds.  Abdominal:     Palpations: Abdomen is soft.     Tenderness: There is no abdominal tenderness.  Musculoskeletal:     Cervical back: Normal range of motion and neck supple.  Skin:    General: Skin is warm and dry.  Neurological:     General: No focal deficit present.     Mental Status: She is alert and oriented to person, place, and time.     Comments: She has 5 out of 5 strength in her upper extremities and 5-/5 strength in her lower extremities.  Psychiatric:        Mood and Affect: Mood normal.        Behavior: Behavior normal.        Thought Content: Thought content normal.        Judgment: Judgment normal.       No results found for any visits on 03/21/21.  Assessment  & Plan     1. Congestive heart failure, unspecified HF chronicity, unspecified heart failure type (HCC) On Lasix 20 mg daily in addition to enalapril  2. Essential (primary) hypertension On enalapril  3. Obstructive apnea Uses CPAP  4. Type 2 diabetes mellitus with mild nonproliferative retinopathy without macular edema, without long-term current use of insulin, unspecified laterality ([place]) Clinically control with A1c's checked at 4.9 under control.  Follow-up when routine lab draws are done.  5. Oxygen dependent Followed by pulmonary.  No follow-ups  on file.      Problem List Items Addressed This Visit   None      Wilhemena Durie, MD  [facility] [phone] (phone) [phone] (fax)  [place]

## 2021-03-24 NOTE — Telephone Encounter (Signed)
Pt called states that she contacted [facility] regarding her wheelchair. She has been advised that they are still needing to have the prescription called into them in order for pt to receive it. Please advise.     Callback # [phone] [id number]

## 2021-03-27 ENCOUNTER — Telehealth: Payer: Self-pay

## 2021-03-27 NOTE — Progress Notes (Signed)
Chronic Care Management Pharmacy Assistant   Name: Kristen Schroeder  MRN: [id number] DOB: 1941-09-18  Reason for Encounter: Medication Review/Medication coordination call.   Recent office visits:  03/21/2021 Dr.Gilbert MD (PCP)  Recent consult visits:  No recent [facility] visits:  None in previous 6 months  Medications: Outpatient Encounter Medications as of 03/27/2021  Medication Sig Note  . acetaminophen (TYLENOL) 500 MG tablet Take 500 mg by mouth every 6 (six) hours as needed.   Marland Kitchen aspirin EC 81 MG tablet Take 81 mg by mouth daily.   Marland Kitchen azithromycin (ZITHROMAX) 250 MG tablet TAKE 2 TABLETS BY MOUTH TODAY, THEN TAKE 1 TABLET DAILY FOR 4 DAYS   . blood glucose meter kit and supplies KIT Dispense based on patient and insurance preference. Use up to four times daily as directed. (FOR ICD-9 250.00, 250.01).   . Blood Glucose Monitoring Suppl (ONE TOUCH ULTRA 2) w/Device KIT 1 each by Does not apply route daily.   . cetirizine (ZYRTEC) 10 MG tablet Take 1 tablet (10 mg total) by mouth daily.   . enalapril (VASOTEC) 20 MG tablet Take 1 tablet (20 mg total) by mouth daily.   . furosemide (LASIX) 20 MG tablet Take 2 tablets (40 mg total) by mouth daily.   Marland Kitchen gabapentin (NEURONTIN) 300 MG capsule TAKE 2 CAPSULES IN THE MORNING, 1 CAPSULE IN THE AFTERNOON AND 2 CAPSULES IN THE EVENING   . ipratropium-albuterol (DUONEB) 0.5-2.5 (3) MG/3ML SOLN Take 3 mLs by nebulization every 6 (six) hours as needed.   . isosorbide mononitrate (IMDUR) 30 MG 24 hr tablet Take 30 mg by mouth daily as needed (high bp). (Patient not taking: No sig reported)   . levothyroxine (SYNTHROID) 125 MCG tablet TAKE 1 TABLET BY MOUTH DAILY   . meclizine (ANTIVERT) 12.5 MG tablet Take 1 tablet (12.5 mg total) by mouth 3 (three) times daily as needed for dizziness. (Patient not taking: No sig reported)   . mometasone-formoterol (DULERA) 100-5 MCG/ACT AERO Inhale 2 puffs into the lungs 2 (two) times daily.   .  MULTIPLE VITAMIN PO Take 1 tablet by mouth daily.    Marland Kitchen omeprazole (PRILOSEC) 40 MG capsule TAKE 1 CAPSULE BY MOUTH EVERY DAY   . ONETOUCH ULTRA test strip USE 1 STRIP TO TEST GLUCOSE TWICE DAILY AS NEEDED   . OXYGEN Inhale 4 L into the lungs. 10/15/2018: Patient uses 3 to 3 1/2 liters continuously  . phenylephrine (NEO-SYNEPHRINE) 1 % nasal spray Place 1 drop into both nostrils every 6 (six) hours as needed for congestion.   . sertraline (ZOLOFT) 50 MG tablet Take 1 tablet (50 mg total) by mouth daily.   Marland Kitchen tiotropium (SPIRIVA) 18 MCG inhalation capsule Place 1 capsule (18 mcg total) into inhaler and inhale daily. (Patient not taking: No sig reported)    No facility-administered encounter medications on file as of 03/27/2021.   Star Rating Drugs: Enalapril 20 mg   Reviewed chart for medication changes ahead of medication coordination call.  BP Readings from Last 3 Encounters:  03/21/21 134/79  01/18/21 (!) 138/58  12/27/20 (!) 142/88    Lab Results  Component Value Date   HGBA1C 4.9 06/28/2020     Patient obtains medications through Adherence Packaging  30 Days   Last adherence delivery included:  None ID  Patient declined medication last month: None ID  Patient is due for next adherence delivery on: 04/06/2021. Called patient and reviewed medications and coordinated delivery.  Patient states  [facility] because she does not feel good.Patient reports she is "sick on her stomach due to sinus drainage running in the back of her throat". Patient states she has no fever,nausea or diarrhea.Notifed Clinical Pharmacist.   Follow up is schedule with PCP on 06/27/2021 at 2:20 pm. Follow Up is schedule with CPP on 08/17/2021 at 2:00 pm.  [place] Pharmacist Assistant [phone]

## 2021-03-29 NOTE — Telephone Encounter (Signed)
Would you still like patient to try manual wheel chair first? Please see previous messages as below. KW

## 2021-03-29 NOTE — Telephone Encounter (Signed)
Prescription for manual wheelchair.

## 2021-03-31 ENCOUNTER — Other Ambulatory Visit: Payer: Self-pay | Admitting: Family Medicine

## 2021-03-31 NOTE — Telephone Encounter (Signed)
Future in 2 month

## 2021-04-01 DIAGNOSIS — J449 Chronic obstructive pulmonary disease, unspecified: Secondary | ICD-10-CM | POA: Diagnosis not present

## 2021-04-04 ENCOUNTER — Other Ambulatory Visit: Payer: Self-pay

## 2021-04-04 ENCOUNTER — Encounter: Payer: Self-pay | Admitting: Internal Medicine

## 2021-04-04 ENCOUNTER — Ambulatory Visit: Payer: Medicare Other | Admitting: Internal Medicine

## 2021-04-04 VITALS — BP 132/64 | HR 93 | Temp 97.4°F | Resp 16 | Ht 67.0 in | Wt 204.6 lb

## 2021-04-04 DIAGNOSIS — I509 Heart failure, unspecified: Secondary | ICD-10-CM | POA: Diagnosis not present

## 2021-04-04 DIAGNOSIS — J449 Chronic obstructive pulmonary disease, unspecified: Secondary | ICD-10-CM

## 2021-04-04 DIAGNOSIS — R0602 Shortness of breath: Secondary | ICD-10-CM | POA: Diagnosis not present

## 2021-04-04 DIAGNOSIS — J9611 Chronic respiratory failure with hypoxia: Secondary | ICD-10-CM

## 2021-04-04 NOTE — Progress Notes (Signed)
[facility] [place], [place] [address]  Pulmonary Sleep Medicine   Office Visit Note  Patient Name: Kristen Schroeder DOB: 11-27-1940 MRN [id number]  Date of Service: 04/04/2021  Complaints/HPI: COPD. Post Covid last year.  Over the past year patient did suffer from COVID infection.  She did recover she still has some residual shortness of breath but her baseline severe pulmonary disease also contributes to her shortness of breath.  Currently she does get exertional dyspnea she also does have some wheezing and she does experience a cough and some congestion.  Spirometry was done as a follow-up   ROS  General: (-) fever, (-) chills, (-) night sweats, (-) weakness Skin: (-) rashes, (-) itching,. Eyes: (-) visual changes, (-) redness, (-) itching. Nose and Sinuses: (-) nasal stuffiness or itchiness, (-) postnasal drip, (-) nosebleeds, (-) sinus trouble. Mouth and Throat: (-) sore throat, (-) hoarseness. Neck: (-) swollen glands, (-) enlarged thyroid, (-) neck pain. Respiratory: + cough, (-) bloody sputum, + shortness of breath, + wheezing. Cardiovascular: - ankle swelling, (-) chest pain. Lymphatic: (-) lymph node enlargement. Neurologic: (-) numbness, (-) tingling. Psychiatric: (-) anxiety, (-) depression   Current Medication: Outpatient Encounter Medications as of 04/04/2021  Medication Sig Note   acetaminophen (TYLENOL) 500 MG tablet Take 500 mg by mouth every 6 (six) hours as needed.    aspirin EC 81 MG tablet Take 81 mg by mouth daily.    azithromycin (ZITHROMAX) 250 MG tablet TAKE 2 TABLETS BY MOUTH TODAY, THEN TAKE 1 TABLET DAILY FOR 4 DAYS    blood glucose meter kit and supplies KIT Dispense based on patient and insurance preference. Use up to four times daily as directed. (FOR ICD-9 250.00, 250.01).    Blood Glucose Monitoring Suppl (ONE TOUCH ULTRA 2) w/Device KIT 1 each by Does not apply route daily.    cetirizine (ZYRTEC) 10 MG tablet TAKE 1  TABLET BY MOUTH EVERY DAY    enalapril (VASOTEC) 20 MG tablet Take 1 tablet (20 mg total) by mouth daily.    furosemide (LASIX) 20 MG tablet Take 2 tablets (40 mg total) by mouth daily.    gabapentin (NEURONTIN) 300 MG capsule TAKE 2 CAPSULES IN THE MORNING, 1 CAPSULE IN THE AFTERNOON AND 2 CAPSULES IN THE EVENING    ipratropium-albuterol (DUONEB) 0.5-2.5 (3) MG/3ML SOLN Take 3 mLs by nebulization every 6 (six) hours as needed.    isosorbide mononitrate (IMDUR) 30 MG 24 hr tablet Take 30 mg by mouth daily as needed (high bp). (Patient not taking: No sig reported)    levothyroxine (SYNTHROID) 125 MCG tablet TAKE 1 TABLET BY MOUTH DAILY    meclizine (ANTIVERT) 12.5 MG tablet Take 1 tablet (12.5 mg total) by mouth 3 (three) times daily as needed for dizziness. (Patient not taking: No sig reported)    mometasone-formoterol (DULERA) 100-5 MCG/ACT AERO Inhale 2 puffs into the lungs 2 (two) times daily.    MULTIPLE VITAMIN PO Take 1 tablet by mouth daily.     omeprazole (PRILOSEC) 40 MG capsule TAKE 1 CAPSULE BY MOUTH EVERY DAY    ONETOUCH ULTRA test strip USE 1 STRIP TO TEST GLUCOSE TWICE DAILY AS NEEDED    OXYGEN Inhale 4 L into the lungs. 10/15/2018: Patient uses 3 to 3 1/2 liters continuously   phenylephrine (NEO-SYNEPHRINE) 1 % nasal spray Place 1 drop into both nostrils every 6 (six) hours as needed for congestion.    sertraline (ZOLOFT) 50 MG tablet Take 1 tablet (  50 mg total) by mouth daily.    tiotropium (SPIRIVA) 18 MCG inhalation capsule Place 1 capsule (18 mcg total) into inhaler and inhale daily. (Patient not taking: No sig reported)    No facility-administered encounter medications on file as of 04/04/2021.    Surgical History: Past Surgical History:  Procedure Laterality Date   ABDOMINAL HYSTERECTOMY     BOWEL RESECTION N/A 04/14/2018   Procedure: SMALL BOWEL RESECTION;  Surgeon: Pabon, Diego F, MD;  Location: [facility];  Service: General;  Laterality: N/A;   BREAST BIOPSY Right     benign   CATARACT EXTRACTION W/PHACO Left 07/03/2016   Procedure: CATARACT EXTRACTION PHACO AND INTRAOCULAR LENS PLACEMENT (IOC);  Surgeon: William Porfilio, MD;  Location: [facility];  Service: Ophthalmology;  Laterality: Left;  Lot: [id number] US: 00:40.1 AP%: 17.4 CDE:6.94   HERNIA REPAIR     LAPAROTOMY N/A 09/04/2018   Procedure: EXPLORATORY LAPAROTOMY;  Surgeon: Piscoya, Jose, MD;  Location: [facility];  Service: General;  Laterality: N/A;   TUBAL LIGATION     VENTRAL HERNIA REPAIR N/A 04/14/2018   Procedure: HERNIA REPAIR VENTRAL ADULT;  Surgeon: Pabon, Diego F, MD;  Location: [facility];  Service: General;  Laterality: N/A;    Medical History: Past Medical History:  Diagnosis Date   Arthritis    COPD (chronic obstructive pulmonary disease) (HCC)    Diabetes mellitus without complication (HCC)    Dysrhythmia    Heart murmur    History of orthopnea    Hypertension    Hypothyroidism    Neuropathy    Oxygen dependent    3L  CONTINUOUS   Pain CHRONIC BACK PAIN   Shortness of breath dyspnea    Wheezing     Family History: Family History  Problem Relation Age of Onset   Heart disease Mother    Drug abuse Other    Hypertension Other     Social History: Social History   Socioeconomic History   Marital status: Married    Spouse name: Not on file   Number of children: 4   Years of education: Not on file   Highest education level: 8th grade  Occupational History   Occupation: retired  Tobacco Use   Smoking status: Former Smoker    Packs/day: 0.50    Years: 15.00    Pack years: 7.50   Smokeless tobacco: Never Used   Tobacco comment: 30-40 years ago  Vaping Use   Vaping Use: Never used  Substance and Sexual Activity   Alcohol use: No    Alcohol/week: 0.0 standard drinks   Drug use: No   Sexual activity: Never  Other Topics Concern   Not on file  Social History Narrative   Not on file   Social Determinants of Health   Financial Resource Strain: Low Risk     Difficulty of Paying Living Expenses: Not hard at all  Food Insecurity: Not on file  Transportation Needs: Not on file  Physical Activity: Not on file  Stress: Not on file  Social Connections: Not on file  Intimate Partner Violence: Not on file    Vital Signs: Blood pressure 132/64, pulse 93, temperature (!) 97.4 F (36.3 C), resp. rate 16, height 5' 7" (1.702 m), weight 204 lb 9.6 oz (92.8 kg), SpO2 (!) 88 %.  Examination: General Appearance: The patient is well-developed, well-nourished, and in no distress. Skin: Gross inspection of skin unremarkable. Head: normocephalic, no gross deformities. Eyes: no gross deformities noted. ENT: ears appear grossly normal no   exudates. Neck: Supple. No thyromegaly. No LAD. Respiratory: distant few rhonchi noted. Cardiovascular: Normal S1 and S2 without murmur or rub. Extremities: No cyanosis. pulses are equal. Neurologic: Alert and oriented. No involuntary movements.  LABS: No results found for this or any previous visit (from the past 2160 hour(s)).  Radiology: CT ANGIO HEAD W OR WO CONTRAST  Result Date: 07/14/2020 CLINICAL DATA:  Follow-up cerebral aneurysm. EXAM: CT ANGIOGRAPHY HEAD TECHNIQUE: Multidetector CT imaging of the head was performed using the standard protocol during bolus administration of intravenous contrast. Multiplanar CT image reconstructions and MIPs were obtained to evaluate the vascular anatomy. CONTRAST:  58m ISOVUE-370 IOPAMIDOL (ISOVUE-370) INJECTION 76% COMPARISON:  07/10/2019 FINDINGS: CT HEAD Brain: There is no evidence of an acute infarct, intracranial hemorrhage, mass, midline shift, or extra-axial fluid collection. The ventricles and sulci are within normal limits for age. Vascular: Calcified atherosclerosis at the skull base. No hyperdense vessel. Skull: Unchanged indeterminate 4 cm region of decreased density laterally and inferiorly in the left parietal skull without expansion or cortical disruption. No new or  progressive finding. Sinuses: The visualized paranasal sinuses and mastoid air cells are clear. Orbits: Unchanged focal depression of the right lamina papyracea. Left cataract extraction. CTA HEAD Anterior circulation: The internal carotid arteries are patent from skull base to carotid termini with mild calcified plaque bilaterally not resulting in significant stenosis. A 3 mm aneurysm projecting posteriorly from the right posterior communicating artery origin and a 3 mm superiorly projecting left paraophthalmic ICA aneurysm are both unchanged. ACAs and MCAs are patent without evidence of a proximal branch occlusion or significant proximal stenosis. Posterior circulation: The visualized distal vertebral arteries are widely patent to the basilar and codominant. Patent PICA, AICA, and SCA origins are identified bilaterally. The basilar artery is widely patent. There are right larger than left posterior communicating arteries. Both PCAs are patent without evidence of a significant proximal stenosis. No aneurysm is identified. Venous sinuses: Patent. Anatomic variants: None. IMPRESSION: 1. 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 2. Mild intracranial atherosclerosis without medium or large vessel occlusion or significant stenosis. Electronically Signed   By: ALogan BoresM.D.   On: 07/14/2020 13:35    No results found.  No results found.    Assessment and Plan: Patient Active Problem List   Diagnosis Date Noted   Depression, major, single episode, complete remission ([place] 05/20/2020   Incarcerated hernia of abdominal cavity 08/04/2018   SBO (small bowel obstruction) ([place] 04/22/2018   Incarcerated hernia 04/14/2018   Aneurysm ([place] 07/10/2016   Nonspecific abnormal finding 05/11/2015   Allergic rhinitis 05/11/2015   Anxiety 05/11/2015   Bronchitis, chronic ([place] 05/11/2015   CCF (congestive cardiac failure) ([place] 05/11/2015   Clinical depression  05/11/2015   DDD (degenerative disc disease), 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/11/2015   Essential (primary) hypertension 05/11/2015   Acid reflux 05/11/2015   HLD (hyperlipidemia) 05/11/2015   Adult hypothyroidism 05/11/2015   Cervical dysplasia, mild 05/11/2015   Neuropathy 05/11/2015   Adiposity 05/11/2015   Obstructive apnea 05/11/2015   Arthritis, degenerative 05/11/2015    1. SOB (shortness of breath)  - Spirometry with Graph  2. Chronic respiratory failure with hypoxia (HCC) She has been on chronic oxygen for many years she continues to do fine with the current O2 levels.  We will continue to monitor her closely and recertify her for her oxygen needs  3. Chronic obstructive pulmonary disease, unspecified COPD type ([place] Severe end-stage terminal disease she has been doing  okay plan is going to be to continue with inhalers and current medical management of her COPD  4. Congestive heart failure, unspecified HF chronicity, unspecified heart failure type ([place]) Compensated monitor fluid status  General Counseling: I have discussed the findings of the evaluation and examination with Reyah.  I have also discussed any further diagnostic evaluation thatmay be needed or ordered today. Wonder verbalizes understanding of the findings of todays visit. We also reviewed her medications today and discussed drug interactions and side effects including but not limited excessive drowsiness and altered mental states. We also discussed that there is always a risk not just to her but also people around her. she has been encouraged to call the office with any questions or concerns that should arise related to todays visit.  Orders Placed This Encounter  Procedures   Spirometry with Graph    Order Specific Question:   Where should this test be performed?    Answer:   [facility]    Order Specific Question:   Basic spirometry    Answer:   Yes     Time spent: 29  I have personally obtained a history,  examined the patient, evaluated laboratory and imaging results, formulated the assessment and plan and placed orders.    Allyne Gee, MD [facility] Pulmonary and Critical Care Sleep medicine

## 2021-04-07 ENCOUNTER — Telehealth: Payer: Self-pay

## 2021-04-07 ENCOUNTER — Other Ambulatory Visit: Payer: Self-pay | Admitting: Family Medicine

## 2021-04-07 DIAGNOSIS — K219 Gastro-esophageal reflux disease without esophagitis: Secondary | ICD-10-CM

## 2021-04-07 NOTE — Chronic Care Management (AMB) (Signed)
    Chronic Care Management Pharmacy Assistant   Name: NEREA BORDENAVE  MRN: [id number] DOB: Apr 30, 1941  04/07/2021- Patient called to remind of appointment with Angelena Sole, CPP on 04/10/2021 at 2:00 pm   No answer, left message of appointment date, time and type of appointment (either telephone or in person). Left message to have all medications, supplements, blood pressure and/or blood sugar logs available during appointment and to return call if need to reschedule.  Star Rating Drug: Enalapril 20 mg- Last filled 01/06/2021 for 90 day supply at [facility]  Lisinopril 20 mg- Last filled 11/20/2018 for [facility] day supply at [facility] Pravastatin 10 mg- Last filled 08/25/2018 for 90 day supply Metformin 1000 mg- Last filled 06/29/2019 for [facility] day supply at [facility]  Any gaps in medications fill history? Dulera- Last filled 12/27/2020 for [facility] day supply at [facility]- Patient has dx of Chronic Bronchitis and Obstructive apnea Spiriva- Last filled 12/09/2020 for [facility] day supply at [facility]- Patient has dx of Chronic Bronchitis and Obstructive apnea Enalapril 20 mg- Last filled 01/06/2021 for [facility] day supply at [facility]  SIG: Billee Cashing, [facility] Clinical Pharmacist Assistant [phone]

## 2021-04-10 ENCOUNTER — Telehealth: Payer: Medicare Other

## 2021-04-10 ENCOUNTER — Telehealth: Payer: Self-pay

## 2021-04-10 NOTE — Telephone Encounter (Signed)
Rx written for manual wheelchair. Awaiting signature from Dr. Sullivan Lone.

## 2021-04-10 NOTE — Progress Notes (Deleted)
Chronic Care Management Pharmacy Note  04/10/2021 Name:  TEMARI SCHOOLER MRN:  [id number] DOB:  09/28/1941  Summary: ***  Recommendations/Changes made from today's visit: ***  Plan: ***   Subjective: Kristen Schroeder is an 80 y.o. year old female who is a primary patient of Jerrol Banana., MD.  The CCM team was consulted for assistance with disease management and care coordination needs.    Engaged with patient by telephone for follow up visit in response to provider referral for pharmacy case management and/or care coordination services.   Consent to Services:  The patient was given information about Chronic Care Management services, agreed to services, and gave verbal consent prior to initiation of services.  Please see initial visit note for detailed documentation.   Patient Care Team: Jerrol Banana., MD as PCP - General (Family Medicine) Birder Robson, MD as Referring Physician (Ophthalmology) Yolonda Kida, MD as Consulting Physician (Cardiology) Allyne Gee, MD as Consulting Physician (Internal Medicine) Germaine Pomfret, [facility] (Pharmacist)  Recent office visits: 03/21/21: Patient presented to Dr. Rosanna Randy for follow-up. No medication changes made.  01/18/21: Patient presented to Dr. Rosanna Randy for follow-up. No medication changes made.   Recent consult visits: 04/04/21: Patient presented to Dr. Humphrey Rolls (Pulmonology) for follow-up. FEV1 34% 12/27/20: Patient presented to Theodoro Grist, NP (Pulmonology)   Hospital visits: None in previous 6 months  Objective:  Lab Results  Component Value Date   CREATININE 0.43 (L) 06/28/2020   BUN 6 (L) 06/28/2020   GFRNONAA 97 06/28/2020   GFRAA 112 06/28/2020   NA 146 (H) 06/28/2020   K 4.4 06/28/2020   CALCIUM 9.4 06/28/2020   CO2 43 (HH) 06/28/2020   GLUCOSE 100 (H) 06/28/2020    Lab Results  Component Value Date/Time   HGBA1C 4.9 06/28/2020 03:31 PM   HGBA1C 5.2 02/29/2020 02:24 PM   HGBA1C 5.3  03/17/2015 12:00 AM   MICROALBUR 20 04/22/2017 12:22 PM    Last diabetic Eye exam:  Lab Results  Component Value Date/Time   HMDIABEYEEXA No Retinopathy 06/13/2018 12:00 AM    Last diabetic Foot exam: No results found for: HMDIABFOOTEX   Lab Results  Component Value Date   CHOL 183 12/15/2019   HDL 58 12/15/2019   LDLCALC 104 (H) 12/15/2019   TRIG 117 12/15/2019   CHOLHDL 3.2 12/15/2019    Hepatic Function Latest Ref Rng & Units 06/28/2020 06/04/2020 04/28/2020  Total Protein 6.0 - 8.5 g/dL 6.6 7.4 -  Albumin 3.7 - 4.7 g/dL 4.0 3.9 3.8  AST 0 - 40 IU/L 15 39 -  ALT 0 - 32 IU/L 6 12 -  Alk Phosphatase 48 - 121 IU/L 90 73 -  Total Bilirubin 0.0 - 1.2 mg/dL <0.2 0.8 -    Lab Results  Component Value Date/Time   TSH 1.330 06/28/2020 03:46 PM   TSH 0.501 12/15/2019 11:18 AM    CBC Latest Ref Rng & Units 06/28/2020 06/04/2020 04/07/2020  WBC 3.4 - 10.8 x10E3/uL 7.2 15.4(H) 7.6  Hemoglobin 11.1 - 15.9 g/dL 8.5(L) 8.3(L) 8.2(L)  Hematocrit 34.0 - 46.6 % 27.8(L) 30.8(L) 27.4(L)  Platelets 150 - 450 x10E3/uL 180 186 169    No results found for: VD25OH  Clinical ASCVD: No  The 10-year ASCVD risk score Mikey Bussing DC Jr., et al., 2013) is: 38.1%   Values used to calculate the score:     Age: 4 years     Sex: Female     Is Non-Hispanic African  American: Yes     Diabetic: Yes     Tobacco smoker: No     Systolic Blood Pressure: 563 mmHg     Is BP treated: Yes     HDL Cholesterol: 58 mg/dL     Total Cholesterol: 183 mg/dL    Depression screen [facility] 2/9 01/18/2021 07/19/2020 05/18/2020  Decreased Interest 0 0 1  Down, Depressed, Hopeless 0 0 1  PHQ - 2 Score 0 0 2  Altered sleeping 0 - 1  Tired, decreased energy 1 - 1  Change in appetite 0 - 2  Feeling bad or failure about yourself  0 - 0  Trouble concentrating 0 - 0  Moving slowly or fidgety/restless 0 - 0  Suicidal thoughts 0 - 0  PHQ-9 Score 1 - 6  Difficult doing work/chores Not difficult at all - Not difficult at all  Some  recent data might be hidden    Social History   Tobacco Use  Smoking Status Former Smoker  . Packs/day: 0.50  . Years: 15.00  . Pack years: 7.50  Smokeless Tobacco Never Used  Tobacco Comment   30-40 years ago   BP Readings from Last 3 Encounters:  04/04/21 132/64  03/21/21 134/79  01/18/21 (!) 138/58   Pulse Readings from Last 3 Encounters:  04/04/21 93  03/21/21 83  01/18/21 80   Wt Readings from Last 3 Encounters:  04/04/21 204 lb 9.6 oz (92.8 kg)  03/21/21 208 lb (94.3 kg)  01/18/21 210 lb (95.3 kg)   BMI Readings from Last 3 Encounters:  04/04/21 32.04 kg/m  03/21/21 33.57 kg/m  01/18/21 33.89 kg/m    Assessment/Interventions: Review of patient past medical history, allergies, medications, health status, including review of consultants reports, laboratory and other test data, was performed as part of comprehensive evaluation and provision of chronic care management services.   SDOH:  (Social Determinants of Health) assessments and interventions performed: Yes  SDOH Screenings   Alcohol Screen: Not on file  Depression (PHQ2-9): Low Risk   . PHQ-2 Score: 1  Financial Resource Strain: Low Risk   . Difficulty of Paying Living Expenses: Not hard at all  Food Insecurity: Not on file  Housing: Not on file  Physical Activity: Not on file  Social Connections: Not on file  Stress: Not on file  Tobacco Use: Medium Risk  . Smoking Tobacco Use: Former Smoker  . Smokeless Tobacco Use: Never Used  Transportation Needs: Not on file    CCM Care Plan  Allergies  Allergen Reactions  . Codeine Nausea And Vomiting and Nausea Only    Medications Reviewed Today    Reviewed by Jimmye Norman, CMA (Certified Medical Assistant) on 04/04/21 at [place] List Status: <None>  Medication Order Taking? Sig Documenting Provider Last Dose Status Informant  acetaminophen (TYLENOL) 500 MG tablet [id number] No Take 500 mg by mouth every 6 (six) hours as needed. [provider] Taking Active   aspirin EC 81 MG tablet [id number] No Take 81 mg by mouth daily. [provider] Taking Active Self  azithromycin (ZITHROMAX) 250 MG tablet [id number] No TAKE 2 TABLETS BY MOUTH TODAY, THEN TAKE 1 TABLET DAILY FOR 4 DAYS Jerrol Banana., MD Taking Active   blood glucose meter kit and supplies KIT [id number] No Dispense based on patient and insurance preference. Use up to four times daily as directed. (FOR ICD-9 250.00, 250.01). Jerrol Banana., MD Taking Active Self  Blood Glucose Monitoring Suppl (ONE  TOUCH ULTRA 2) w/Device KIT [id number] No 1 each by Does not apply route daily. Jerrol Banana., MD Taking Active Self  cetirizine (ZYRTEC) 10 MG tablet [id number]  TAKE 1 TABLET BY MOUTH EVERY DAY Jerrol Banana., MD  Active   enalapril (VASOTEC) 20 MG tablet [id number] No Take 1 tablet (20 mg total) by mouth daily. Jerrol Banana., MD Taking Active   furosemide (LASIX) 20 MG tablet [id number] No Take 2 tablets (40 mg total) by mouth daily. Jerrol Banana., MD Taking Active   gabapentin (NEURONTIN) 300 MG capsule [id number] No TAKE 2 CAPSULES IN THE MORNING, 1 CAPSULE IN THE AFTERNOON AND 2 CAPSULES IN THE EVENING Jerrol Banana., MD Taking Active   ipratropium-albuterol (DUONEB) 0.5-2.5 (3) MG/3ML SOLN [id number] No Take 3 mLs by nebulization every 6 (six) hours as needed. Luiz Ochoa, NP Taking Active   isosorbide mononitrate (IMDUR) 30 MG 24 hr tablet [id number] No Take 30 mg by mouth daily as needed (high bp).  Patient not taking: No sig reported   [provider] Not Taking Active            Med Note Hassan Buckler, MCKENZIE A   Thu Dec 10, 2019  9:04 AM)    levothyroxine (SYNTHROID) 125 MCG tablet [id number]  TAKE 1 TABLET BY MOUTH DAILY Jerrol Banana., MD  Active   meclizine (ANTIVERT) 12.5 MG tablet [id number] No Take 1 tablet (12.5 mg total) by mouth 3 (three) times daily as needed for dizziness.   Patient not taking: No sig reported   Virginia Crews, MD Not Taking Active   mometasone-formoterol [facility]) 100-5 MCG/ACT AERO [id number] No Inhale 2 puffs into the lungs 2 (two) times daily. [provider] Taking Active   MULTIPLE VITAMIN PO [id number] No Take 1 tablet by mouth daily.  [provider] Taking Active Self           Med Note Sidonie Dickens   Tue Apr 09, 2017  1:30 PM)    omeprazole (PRILOSEC) 40 MG capsule [id number] No TAKE 1 CAPSULE BY MOUTH EVERY DAY Jerrol Banana., MD Taking Active   [facility] ULTRA test strip [id number] No USE 1 STRIP TO TEST GLUCOSE TWICE DAILY AS NEEDED Jerrol Banana., MD Taking Active   OXYGEN [id number] No Inhale 4 L into the lungs. [provider] Taking Active Self           Med Note Rollene Rotunda, Clois Dupes   Wed Oct 15, 2018  1:56 PM) Patient uses 3 to 3 1/2 liters continuously  phenylephrine (NEO-SYNEPHRINE) 1 % nasal spray [id number] No Place 1 drop into both nostrils every 6 (six) hours as needed for congestion. [provider] Taking Active   sertraline (ZOLOFT) 50 MG tablet [id number] No Take 1 tablet (50 mg total) by mouth daily. Jerrol Banana., MD Taking Active   tiotropium [facility]) 18 MCG inhalation capsule [id number] No Place 1 capsule (18 mcg total) into inhaler and inhale daily.  Patient not taking: No sig reported   Luiz Ochoa, NP Not Taking Active           Patient Active Problem List   Diagnosis Date Noted  . Depression, major, single episode, complete remission ([place]) 05/20/2020  . Incarcerated hernia of abdominal cavity 08/04/2018  . SBO (small bowel obstruction) ([place]) 04/22/2018  . Incarcerated hernia 04/14/2018  . Aneurysm ([place]) 07/10/2016  . Nonspecific abnormal finding 05/11/2015  .  Allergic rhinitis 05/11/2015  . Anxiety 05/11/2015  . Bronchitis, chronic ([place]) 05/11/2015  . CCF (congestive cardiac failure) ([place]) 05/11/2015  . Clinical depression 05/11/2015  .  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 Essential (primary) hypertension 05/11/2015  . Acid reflux 05/11/2015  . HLD (hyperlipidemia) 05/11/2015  . Adult hypothyroidism 05/11/2015  . Cervical dysplasia, mild 05/11/2015  . Neuropathy 05/11/2015  . Adiposity 05/11/2015  . Obstructive apnea 05/11/2015  . Arthritis, degenerative 05/11/2015    Immunization History  Administered Date(s) Administered  . Fluad Quad(high Dose 65+) 07/09/2019, 07/27/2020  . Influenza Split 07/23/2012  . Influenza, High Dose Seasonal PF 08/24/2014, 08/18/2015, 08/28/2016, 07/11/2017, 09/24/2018  . Influenza,inj,Quad PF,6+ Mos 07/29/2013  . PFIZER(Purple Top)SARS-COV-2 Vaccination 12/20/2019, 01/12/2020, 10/27/2020  . Pneumococcal Conjugate-13 08/24/2014  . Pneumococcal Polysaccharide-23 11/03/1999, 07/23/2012  . Tdap 07/23/2012  . Zoster, Live 07/23/2012    Conditions to be addressed/monitored:  Hypertension, Hyperlipidemia, Heart Failure, COPD, Hypothyroidism, Depression and Anxiety  There are no care plans that you recently modified to display for this patient.    Medication Assistance: None required.  Patient affirms current coverage meets needs.  Patient's preferred pharmacy [facility]:  [id number] [place] [id number]: [phone]: [phone] Uses pill box? No - doesn't need Pt endorses 60% compliance  We discussed: Verbal consent obtained for [facility] enhanced pharmacy services (medication synchronization, adherence packaging, delivery coordination). A medication sync plan was created to allow patient to get all medications delivered once every 30 to 90 days per patient preference. Patient understands they have freedom to choose pharmacy and clinical pharmacist will coordinate care between all prescribers and [facility].  Patient decided to: Utilize [facility] for medication  synchronization, packaging and delivery  Care Plan and Follow Up Patient Decision:  Patient agrees to Care Plan and Follow-up.  Plan: Telephone follow up appointment with care management team member scheduled for:  08/07/2021 at 2:00 PM  AJunius Argyle PharmD, [phone]  Current Barriers:  . Unable to self administer medications as prescribed  Pharmacist Clinical Goal(s):  .Marland KitchenPatient will maintain control of blood pressure as evidenced by BP less than 140/90  through collaboration with PharmD and provider.   Interventions: . 1:1 collaboration with GJerrol Banana, MD regarding development and update of comprehensive plan of care as evidenced by provider attestation and co-signature . Inter-disciplinary care team collaboration (see longitudinal plan of care) . Comprehensive medication review performed; medication list updated in electronic medical record  Heart Failure (Goal: manage symptoms and prevent exacerbations) -Controlled -Last ejection fraction: NA (Date: NA) -HF type: Diastolic -NYHA Class: II (slight limitation of activity) -AHA HF Stage: C (Heart disease and symptoms present) -Current treatment: . Enalapril 20 mg daily  . Furosemide 20 mg 2 tablets daily  . Imdur 30 mg daily -Medications previously tried: NA  -Current home BP/HR readings: 130-135/60-70  -Educated on Importance of weighing daily; if you gain more than 3 pounds in one day or 5 pounds in one week, contact provider Importance of blood pressure control -Recommended to continue current medication  COPD (Goal: control symptoms and prevent exacerbations) -Controlled -Current treatment   . DuoNeb Soln three times daily as needed  . Dulera two puffs twice daily   -Medications previously tried: NA  -Gold Grade: Gold 4 (FEV1<40%) -Current COPD Classification:  D (high sx, >/=2 exacerbations/yr) -MMRC/CAT score: NA -Pulmonary function testing:  34% -Exacerbations  requiring treatment in last 6 months: Yes, two courses of azithromycin  -Patient reports consistent use of maintenance inhaler -Counseled on Benefits of consistent maintenance inhaler use -Recommended to continue current medication  Depression/Anxiety (Goal: maintain stable mood) -Controlled -Current treatment: . Sertraline 50 mg daily  -Medications previously tried/failed: NA -PHQ9: 1 -GAD7: 3 -Connected with None for mental health support -Educated on Benefits of cognitive-behavioral therapy with or without medication. Patient was given information for [place] behavioral [facility]  -Patient reported only taking sertraline as needed for symptoms. Counseled patient on benefits of daily administration of sertraline for mood. -Recommended to continue current medication  GERD (Goal: Prevent heartburn/reflux) -Controlled -Current treatment  . Omeprazole 40 mg daily  -Medications previously tried: NA  -Counseled on trigger avoidance, avoiding laying down after meals. -Recommended to continue current medication  Allergic Rhinitis (Goal: minimize allergy symptoms) -Not ideally controlled -Current treatment  . Cetirizine 10 mg daily  . Flonase 1 spray daily PRN  -Medications previously tried: NA  -Symptoms have been worsening lately due to seasonal pollen. -Counseled on allergen avoidance, keeping windows closed, cleaning sheets regularly, and wearing a mask when outdoors on high pollen days -Recommended to continue current medication  Patient Goals/Self-Care Activities . Patient will:  - check blood pressure 2-3 times weekly, document, and provide at future appointments  Follow Up Plan: ***

## 2021-04-10 NOTE — Telephone Encounter (Signed)
Prescription was given to patient in office. KW

## 2021-04-10 NOTE — Telephone Encounter (Signed)
Copied from CRM [id number]. Topic: General - Inquiry >> Apr 10, 2021  2:17 PM Daphine Deutscher D wrote: Reason for CRM: Pt called saying she though she talked to Dr. Sullivan Lone about a new wheelchair and she has not heard anything back.  She said [facility] told her a prescription needed to faxed to them.  She does not have their fax number.  She said she thought she talked to Dr. Sullivan Lone about this last time she was in last.  CB#  [phone]

## 2021-04-11 NOTE — Telephone Encounter (Signed)
Faxed to [facility] [phone]

## 2021-04-12 ENCOUNTER — Other Ambulatory Visit: Payer: Self-pay | Admitting: Internal Medicine

## 2021-04-14 DIAGNOSIS — J449 Chronic obstructive pulmonary disease, unspecified: Secondary | ICD-10-CM | POA: Diagnosis not present

## 2021-04-25 ENCOUNTER — Telehealth: Payer: Self-pay

## 2021-04-25 NOTE — Progress Notes (Signed)
Chronic Care Management Pharmacy Assistant   Name: Kristen Schroeder  MRN: [id number] DOB: [birth year]  Reason for Encounter: Medication Review/General Adherence Call.   Recent office visits:  No recent Office Visit  Recent consult visits:  04/04/2021 Dr.Khan MD (Pulmonology)  Hospital visits:  None in previous 6 months  Medications: Outpatient Encounter Medications as of 04/25/2021  Medication Sig Note   acetaminophen (TYLENOL) 500 MG tablet Take 500 mg by mouth every 6 (six) hours as needed.    aspirin EC 81 MG tablet Take 81 mg by mouth daily.    azithromycin (ZITHROMAX) 250 MG tablet TAKE 2 TABLETS BY MOUTH TODAY, THEN TAKE 1 TABLET DAILY FOR 4 DAYS    blood glucose meter kit and supplies KIT Dispense based on patient and insurance preference. Use up to four times daily as directed. (FOR ICD-9 250.00, 250.01).    Blood Glucose Monitoring Suppl (ONE TOUCH ULTRA 2) w/Device KIT 1 each by Does not apply route daily.    cetirizine (ZYRTEC) 10 MG tablet TAKE 1 TABLET BY MOUTH EVERY DAY    enalapril (VASOTEC) 20 MG tablet TAKE 1 TABLET BY MOUTH EVERY DAY    furosemide (LASIX) 20 MG tablet Take 2 tablets (40 mg total) by mouth daily.    gabapentin (NEURONTIN) 300 MG capsule TAKE 2 CAPSULES IN THE MORNING, 1 CAPSULE IN THE AFTERNOON AND 2 CAPSULES IN THE EVENING    ipratropium-albuterol (DUONEB) 0.5-2.5 (3) MG/3ML SOLN INHALE 1 VIAL VIA NEBULIZER BY MOUTH 3 TIMES A DAY AS NEEDED FOR COPD/ASTHMA    isosorbide mononitrate (IMDUR) 30 MG 24 hr tablet Take 30 mg by mouth daily as needed (high bp). (Patient not taking: No sig reported)    levothyroxine (SYNTHROID) 125 MCG tablet TAKE 1 TABLET BY MOUTH DAILY    meclizine (ANTIVERT) 12.5 MG tablet Take 1 tablet (12.5 mg total) by mouth 3 (three) times daily as needed for dizziness. (Patient not taking: No sig reported)    mometasone-formoterol (DULERA) 100-5 MCG/ACT AERO Inhale 2 puffs into the lungs 2 (two) times daily.    MULTIPLE VITAMIN PO  Take 1 tablet by mouth daily.     omeprazole (PRILOSEC) 40 MG capsule TAKE 1 CAPSULE BY MOUTH EVERY DAY    ONETOUCH ULTRA test strip USE 1 STRIP TO TEST GLUCOSE TWICE DAILY AS NEEDED    OXYGEN Inhale 4 L into the lungs. 10/15/2018: Patient uses 3 to 3 1/2 liters continuously   phenylephrine (NEO-SYNEPHRINE) 1 % nasal spray Place 1 drop into both nostrils every 6 (six) hours as needed for congestion.    sertraline (ZOLOFT) 50 MG tablet Take 1 tablet (50 mg total) by mouth daily.    tiotropium (SPIRIVA) 18 MCG inhalation capsule Place 1 capsule (18 mcg total) into inhaler and inhale daily. (Patient not taking: No sig reported)    No facility-administered encounter medications on file as of 04/25/2021.    Care Gaps: None ID Star Rating Drugs: Enalapril 20 mg- [facility] Lisinopril 20 mg- Last [facility]/20/[facility] Pravastatin 10 mg- Last filled 08/25/2018 for 90 day supply Metformin 1000 mg- [facility]   Called patient and discussed medication adherence  with patient, yes issues at this time with current medication.   Patient denies ED visit since her last CPP follow up.  Patient reports  side effects with her medication.Patient states she has been sick to her  stomach, unsure of what medication it is but thinks it could be her thyroid medication.  Patient denies any problems with her current pharmacy  Reschedule follow up that was on 04/10/2021 to 05/02/2021 at 1:00 pm telephone visit with clinical pharmacist.Sent message to scheduler.  [place] Pharmacist Assistant [phone]

## 2021-05-01 ENCOUNTER — Telehealth: Payer: Self-pay

## 2021-05-01 NOTE — Progress Notes (Signed)
Spoke to patient to confirmed patient telephone appointment on 05/02/2021 for CCM at 1:00 am with Angelena Sole the Clinical pharmacist. Left message to have all medications, supplements, blood pressure  logs available during appointment and to return call if need to reschedule.  Star Rating Drug: Enalapril 20 mg- [facility] Lisinopril 20 mg- Last filled 11/20/2018 [facility] Pravastatin 10 mg- Last filled 08/25/2018 for 90 day supply Metformin 1000 mg- Last [facility]  Any gaps in medications fill history? Lisinopril 20 mg- Last filled 11/20/[facility] Pravastatin 10 mg- Last filled 08/25/2018 for 90 day supply Metformin 1000 mg- Last [facility]  [facility] Clinical Pharmacist Assistant [phone]

## 2021-05-02 ENCOUNTER — Telehealth: Payer: Medicare Other

## 2021-05-02 DIAGNOSIS — J449 Chronic obstructive pulmonary disease, unspecified: Secondary | ICD-10-CM | POA: Diagnosis not present

## 2021-05-02 NOTE — Progress Notes (Deleted)
Chronic Care Management Pharmacy Note  05/02/2021 Name:  Kristen Schroeder MRN:  [id number] DOB:  [birth year]  Summary: ***  Recommendations/Changes made from today's visit: ***  Plan: ***   Subjective: Kristen Schroeder is an 80 y.o. year old female who is a primary patient of Jerrol Banana., MD.  The CCM team was consulted for assistance with disease management and care coordination needs.    Engaged with patient by telephone for follow up visit in response to provider referral for pharmacy case management and/or care coordination services.   Consent to Services:  The patient was given information about Chronic Care Management services, agreed to services, and gave verbal consent prior to initiation of services.  Please see initial visit note for detailed documentation.   Patient Care Team: Jerrol Banana., MD as PCP - General (Family Medicine) Birder Robson, MD as Referring Physician (Ophthalmology) Yolonda Kida, MD as Consulting Physician (Cardiology) Allyne Gee, MD as Consulting Physician (Internal Medicine) Germaine Pomfret, [facility] (Pharmacist)  Recent office visits: 03/21/21: Patient presented to Dr. Rosanna Randy for follow-up. No medication changes made.  01/18/21: Patient presented to Dr. Rosanna Randy for follow-up. No medication changes made.   Recent consult visits: 04/04/21: Patient presented to Dr. Humphrey Rolls (Pulmonology) for follow-up. FEV1 34% 12/27/20: Patient presented to Theodoro Grist, NP (Pulmonology)   Hospital visits: None in previous 6 months  Objective:  Lab Results  Component Value Date   CREATININE 0.43 (L) 06/28/2020   BUN 6 (L) 06/28/2020   GFRNONAA 97 06/28/2020   GFRAA 112 06/28/2020   NA 146 (H) 06/28/2020   K 4.4 06/28/2020   CALCIUM 9.4 06/28/2020   CO2 43 (HH) 06/28/2020   GLUCOSE 100 (H) 06/28/2020    Lab Results  Component Value Date/Time   HGBA1C 4.9 06/28/2020 03:31 PM   HGBA1C 5.2 02/29/2020 02:24 PM   HGBA1C 5.3  03/17/2015 12:00 AM   MICROALBUR 20 04/22/2017 12:22 PM    Last diabetic Eye exam:  Lab Results  Component Value Date/Time   HMDIABEYEEXA No Retinopathy 06/13/2018 12:00 AM    Last diabetic Foot exam: No results found for: HMDIABFOOTEX   Lab Results  Component Value Date   CHOL 183 12/15/2019   HDL 58 12/15/2019   LDLCALC 104 (H) 12/15/2019   TRIG 117 12/15/2019   CHOLHDL 3.2 12/15/2019    Hepatic Function Latest Ref Rng & Units 06/28/2020 06/04/2020 04/28/2020  Total Protein 6.0 - 8.5 g/dL 6.6 7.4 -  Albumin 3.7 - 4.7 g/dL 4.0 3.9 3.8  AST 0 - 40 IU/L 15 39 -  ALT 0 - 32 IU/L 6 12 -  Alk Phosphatase 48 - 121 IU/L 90 73 -  Total Bilirubin 0.0 - 1.2 mg/dL <0.2 0.8 -    Lab Results  Component Value Date/Time   TSH 1.330 06/28/2020 03:46 PM   TSH 0.501 12/15/2019 11:18 AM    CBC Latest Ref Rng & Units 06/28/2020 06/04/2020 04/07/2020  WBC 3.4 - 10.8 x10E3/uL 7.2 15.4(H) 7.6  Hemoglobin 11.1 - 15.9 g/dL 8.5(L) 8.3(L) 8.2(L)  Hematocrit 34.0 - 46.6 % 27.8(L) 30.8(L) 27.4(L)  Platelets 150 - 450 x10E3/uL 180 186 169    No results found for: VD25OH  Clinical ASCVD: No  The 10-year ASCVD risk score Mikey Bussing DC Jr., et al., 2013) is: 38.1%   Values used to calculate the score:     Age: 40 years     Sex: Female     Is Non-Hispanic African  American: Yes     Diabetic: Yes     Tobacco smoker: No     Systolic Blood Pressure: 914 mmHg     Is BP treated: Yes     HDL Cholesterol: 58 mg/dL     Total Cholesterol: 183 mg/dL    Depression screen [facility] 2/9 01/18/2021 07/19/2020 05/18/2020  Decreased Interest 0 0 1  Down, Depressed, Hopeless 0 0 1  PHQ - 2 Score 0 0 2  Altered sleeping 0 - 1  Tired, decreased energy 1 - 1  Change in appetite 0 - 2  Feeling bad or failure about yourself  0 - 0  Trouble concentrating 0 - 0  Moving slowly or fidgety/restless 0 - 0  Suicidal thoughts 0 - 0  PHQ-9 Score 1 - 6  Difficult doing work/chores Not difficult at all - Not difficult at all  Some  recent data might be hidden    Social History   Tobacco Use  Smoking Status Former   Packs/day: 0.50   Years: 15.00   Pack years: 7.50   Types: Cigarettes  Smokeless Tobacco Never  Tobacco Comments   30-40 years ago   BP Readings from Last 3 Encounters:  04/04/21 132/64  03/21/21 134/79  01/18/21 (!) 138/58   Pulse Readings from Last 3 Encounters:  04/04/21 93  03/21/21 83  01/18/21 80   Wt Readings from Last 3 Encounters:  04/04/21 204 lb 9.6 oz (92.8 kg)  03/21/21 208 lb (94.3 kg)  01/18/21 210 lb (95.3 kg)   BMI Readings from Last 3 Encounters:  04/04/21 32.04 kg/m  03/21/21 33.57 kg/m  01/18/21 33.89 kg/m    Assessment/Interventions: Review of patient past medical history, allergies, medications, health status, including review of consultants reports, laboratory and other test data, was performed as part of comprehensive evaluation and provision of chronic care management services.   SDOH:  (Social Determinants of Health) assessments and interventions performed: Yes  SDOH Screenings   Alcohol Screen: Not on file  Depression (PHQ2-9): Low Risk    PHQ-2 Score: 1  Financial Resource Strain: Low Risk    Difficulty of Paying Living Expenses: Not hard at all  Food Insecurity: Not on file  Housing: Not on file  Physical Activity: Not on file  Social Connections: Not on file  Stress: Not on file  Tobacco Use: Medium Risk   Smoking Tobacco Use: Former   Smokeless Tobacco Use: Never  Transportation Needs: Not on file    [place]  Allergies  Allergen Reactions   Codeine Nausea And Vomiting and Nausea Only    Medications Reviewed Today     Reviewed by Jimmye Norman, CMA (Certified Medical Assistant) on 04/04/21 at [place] List Status: <None>   Medication Order Taking? Sig Documenting Provider Last Dose Status Informant  acetaminophen (TYLENOL) 500 MG tablet [id number] No Take 500 mg by mouth every 6 (six) hours as needed. [provider] Taking Active   aspirin EC 81 MG tablet [id number] No Take 81 mg by mouth daily. [provider] Taking Active Self  azithromycin (ZITHROMAX) 250 MG tablet [id number] No TAKE 2 TABLETS BY MOUTH TODAY, THEN TAKE 1 TABLET DAILY FOR 4 DAYS Jerrol Banana., MD Taking Active   blood glucose meter kit and supplies KIT [id number] No Dispense based on patient and insurance preference. Use up to four times daily as directed. (FOR ICD-9 250.00, 250.01). Jerrol Banana., MD Taking Active Self  Blood Glucose Monitoring  Suppl (ONE TOUCH ULTRA 2) w/Device KIT [id number] No 1 each by Does not apply route daily. Jerrol Banana., MD Taking Active Self  cetirizine (ZYRTEC) 10 MG tablet [id number]  TAKE 1 TABLET BY MOUTH EVERY DAY Jerrol Banana., MD  Active   enalapril (VASOTEC) 20 MG tablet [id number] No Take 1 tablet (20 mg total) by mouth daily. Jerrol Banana., MD Taking Active   furosemide (LASIX) 20 MG tablet [id number] No Take 2 tablets (40 mg total) by mouth daily. Jerrol Banana., MD Taking Active   gabapentin (NEURONTIN) 300 MG capsule [id number] No TAKE 2 CAPSULES IN THE MORNING, 1 CAPSULE IN THE AFTERNOON AND 2 CAPSULES IN THE EVENING Jerrol Banana., MD Taking Active   ipratropium-albuterol (DUONEB) 0.5-2.5 (3) MG/3ML SOLN [id number] No Take 3 mLs by nebulization every 6 (six) hours as needed. Luiz Ochoa, NP Taking Active   isosorbide mononitrate (IMDUR) 30 MG 24 hr tablet [id number] No Take 30 mg by mouth daily as needed (high bp).  Patient not taking: No sig reported   [provider] Not Taking Active            Med Note Hassan Buckler, MCKENZIE A   Thu Dec 10, 2019  9:04 AM)    levothyroxine (SYNTHROID) 125 MCG tablet [id number]  TAKE 1 TABLET BY MOUTH DAILY Jerrol Banana., MD  Active   meclizine (ANTIVERT) 12.5 MG tablet [id number] No Take 1 tablet (12.5 mg total) by mouth 3 (three) times daily as needed for dizziness.   Patient not taking: No sig reported   Virginia Crews, MD Not Taking Active   mometasone-formoterol [facility]) 100-5 MCG/ACT AERO [id number] No Inhale 2 puffs into the lungs 2 (two) times daily. [provider] Taking Active   MULTIPLE VITAMIN PO [id number] No Take 1 tablet by mouth daily.  [provider] Taking Active Self           Med Note Sidonie Dickens   Tue Apr 09, 2017  1:30 PM)    omeprazole (PRILOSEC) 40 MG capsule [id number] No TAKE 1 CAPSULE BY MOUTH EVERY DAY Jerrol Banana., MD Taking Active   [facility] ULTRA test strip [id number] No USE 1 STRIP TO TEST GLUCOSE TWICE DAILY AS NEEDED Jerrol Banana., MD Taking Active   OXYGEN [id number] No Inhale 4 L into the lungs. [provider] Taking Active Self           Med Note Rollene Rotunda, Clois Dupes   Wed Oct 15, 2018  1:56 PM) Patient uses 3 to 3 1/2 liters continuously  phenylephrine (NEO-SYNEPHRINE) 1 % nasal spray [id number] No Place 1 drop into both nostrils every 6 (six) hours as needed for congestion. [provider] Taking Active   sertraline (ZOLOFT) 50 MG tablet [id number] No Take 1 tablet (50 mg total) by mouth daily. Jerrol Banana., MD Taking Active   tiotropium [facility]) 18 MCG inhalation capsule [id number] No Place 1 capsule (18 mcg total) into inhaler and inhale daily.  Patient not taking: No sig reported   Luiz Ochoa, NP Not Taking Active             Patient Active Problem List   Diagnosis Date Noted   Depression, major, single episode, complete remission ([place]) 05/20/2020   Incarcerated hernia of abdominal cavity 08/04/2018   SBO (small bowel obstruction) ([place]) 04/22/2018   Incarcerated hernia 04/14/2018   Aneurysm ([place]) 07/10/2016   Nonspecific  abnormal finding 05/11/2015   Allergic rhinitis 05/11/2015   Anxiety 05/11/2015   Bronchitis, chronic ([place]) 05/11/2015   CCF (congestive cardiac failure) ([place]) 05/11/2015   Clinical depression 05/11/2015   DDD  (degenerative disc disease), 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/11/2015   Essential (primary) hypertension 05/11/2015   Acid reflux 05/11/2015   HLD (hyperlipidemia) 05/11/2015   Adult hypothyroidism 05/11/2015   Cervical dysplasia, mild 05/11/2015   Neuropathy 05/11/2015   Adiposity 05/11/2015   Obstructive apnea 05/11/2015   Arthritis, degenerative 05/11/2015    Immunization History  Administered Date(s) Administered   Fluad Quad(high Dose 65+) 07/09/2019, 07/27/2020   Influenza Split 07/23/2012   Influenza, High Dose Seasonal PF 08/24/2014, 08/18/2015, 08/28/2016, 07/11/2017, 09/24/2018   Influenza,inj,Quad PF,6+ Mos 07/29/2013   PFIZER(Purple Top)SARS-COV-2 Vaccination 12/20/2019, 01/12/2020, 10/27/2020   Pneumococcal Conjugate-13 08/24/2014   Pneumococcal Polysaccharide-23 11/03/1999, 07/23/2012   Tdap 07/23/2012   Zoster, Live 07/23/2012    Conditions to be addressed/monitored:  Hypertension, Hyperlipidemia, Heart Failure, COPD, Hypothyroidism, Depression and Anxiety  There are no care plans that you recently modified to display for this patient.    Medication Assistance: None required.  Patient affirms current coverage meets needs.  Patient's preferred pharmacy is:  [id number] [place] [id number]: [phone]: [phone] Uses pill box? No - doesn't need Pt endorses 60% compliance  We discussed: Verbal consent obtained for [facility] enhanced pharmacy services (medication synchronization, adherence packaging, delivery coordination). A medication sync plan was created to allow patient to get all medications delivered once every 30 to 90 days per patient preference. Patient understands they have freedom to choose pharmacy and clinical pharmacist will coordinate care between all prescribers and [facility].  Patient decided to: Utilize [facility] for medication synchronization, packaging and  delivery  Care Plan and Follow Up Patient Decision:  Patient agrees to Care Plan and Follow-up.  Plan: Telephone follow up appointment with care management team member scheduled for:  08/07/2021 at 2:00 PM  AJunius Argyle PharmD, CPP Clinical Pharmacist [id number] [phone]  Current Barriers:  Unable to self administer medications as prescribed  Pharmacist Clinical Goal(s):  Patient will maintain control of blood pressure as evidenced by BP less than 140/90  through collaboration with PharmD and provider.   Interventions: 1:1 collaboration with GJerrol Banana, MD regarding development and update of comprehensive plan of care as evidenced by provider attestation and co-signature Inter-disciplinary care team collaboration (see longitudinal plan of care) Comprehensive medication review performed; medication list updated in electronic medical record  Heart Failure (Goal: manage symptoms and prevent exacerbations) -Controlled -Last ejection fraction: NA (Date: NA) -HF type: Diastolic -NYHA Class: II (slight limitation of activity) -AHA HF Stage: C (Heart disease and symptoms present) -Current treatment: Enalapril 20 mg daily  Furosemide 20 mg 2 tablets daily  Imdur 30 mg daily -Medications previously tried: NA  -Current home BP/HR readings: 130-135/60-70  -Educated on Importance of weighing daily; if you gain more than 3 pounds in one day or 5 pounds in one week, contact provider Importance of blood pressure control -Recommended to continue current medication  COPD (Goal: control symptoms and prevent exacerbations) -Controlled -Current treatment   DuoNeb Soln three times daily as needed  Dulera two puffs twice daily   -Medications previously tried: NA  -Gold Grade: Gold 4 (FEV1<40%) -Current COPD Classification:  D (high sx, >/=2 exacerbations/yr) -MMRC/CAT score: NA -Pulmonary function testing: 34% -Exacerbations requiring treatment in last 6  months: Yes, two courses of azithromycin  -Patient reports consistent use of maintenance inhaler -Counseled on Benefits of consistent maintenance inhaler use -Recommended to continue current medication  Depression/Anxiety (Goal: maintain stable mood) -Controlled -Current treatment: Sertraline 50 mg daily  -Medications previously tried/failed: NA -PHQ9: 1 -GAD7: 3 -Connected with None for mental health support -Educated on Benefits of cognitive-behavioral therapy with or without medication. Patient was given information for [facility]  -Patient reported only taking sertraline as needed for symptoms. Counseled patient on benefits of daily administration of sertraline for mood. -Recommended to continue current medication  GERD (Goal: Prevent heartburn/reflux) -Controlled -Current treatment  Omeprazole 40 mg daily  -Medications previously tried: NA  -Counseled on trigger avoidance, avoiding laying down after meals. -Recommended to continue current medication  Allergic Rhinitis (Goal: minimize allergy symptoms) -Not ideally controlled -Current treatment  Cetirizine 10 mg daily  Flonase 1 spray daily PRN  -Medications previously tried: NA  -Symptoms have been worsening lately due to seasonal pollen. -Counseled on allergen avoidance, keeping windows closed, cleaning sheets regularly, and wearing a mask when outdoors on high pollen days -Recommended to continue current medication  Patient Goals/Self-Care Activities Patient will:  - check blood pressure 2-3 times weekly, document, and provide at future appointments  Follow Up Plan: ***

## 2021-05-14 DIAGNOSIS — J449 Chronic obstructive pulmonary disease, unspecified: Secondary | ICD-10-CM | POA: Diagnosis not present

## 2021-05-15 DIAGNOSIS — J449 Chronic obstructive pulmonary disease, unspecified: Secondary | ICD-10-CM | POA: Diagnosis not present

## 2021-05-22 ENCOUNTER — Telehealth: Payer: Self-pay

## 2021-05-22 NOTE — Telephone Encounter (Signed)
Copied from CRM [id number]. Topic: Appointment Scheduling - Scheduling Inquiry for Clinic >> May 22, 2021 12:38 PM Randol Kern wrote: Best contact: [phone]   Pt has questions for PCP, confidential. Requesting call back today or asap. Pt declined next available appt.

## 2021-05-23 NOTE — Telephone Encounter (Signed)
Tried calling patient, no answer. Will try again later.  

## 2021-05-24 NOTE — Telephone Encounter (Signed)
Spoke with patient and she had a covid exposure this past weekend. She reports that she tested yesterday and it was negative. She reports that she had no air in her home for the last 2 days and feels that her symptoms could have been related to that. She is feeling much better now that she has her air fixed. She will call if symptoms do not improve.

## 2021-05-26 MED ORDER — NIRMATRELVIR/RITONAVIR (PAXLOVID)TABLET: ORAL_TABLET | ORAL | 0 refills | Status: DC

## 2021-05-26 NOTE — Telephone Encounter (Signed)
Advised patient as below. Paxlovid was sent into the pharmacy. Advised patient to take only if developing symptoms.

## 2021-05-26 NOTE — Telephone Encounter (Signed)
Pt is calling back to speak to [place]. Pt states that someone in the home has COVID. She states that she feels fine. And she has tested herself she is negative.CB- [phone]

## 2021-05-26 NOTE — Addendum Note (Signed)
Addended by: [place] on: 05/26/2021 01:53 PM   Modules accepted: Orders

## 2021-05-26 NOTE — Telephone Encounter (Signed)
Someone in the home tested positive for covid. Should the patient be treated even though she is currently not having any symptoms? Please advise. Thanks!

## 2021-05-29 ENCOUNTER — Emergency Department
Admission: EM | Admit: 2021-05-29 | Discharge: 2021-05-29 | Disposition: A | Discharge status: Home / Self Care | Payer: Medicare Other | Attending: Emergency Medicine | Admitting: Emergency Medicine

## 2021-05-29 ENCOUNTER — Encounter: Payer: Self-pay | Admitting: Emergency Medicine

## 2021-05-29 ENCOUNTER — Other Ambulatory Visit: Payer: Self-pay

## 2021-05-29 DIAGNOSIS — E039 Hypothyroidism, unspecified: Secondary | ICD-10-CM | POA: Insufficient documentation

## 2021-05-29 DIAGNOSIS — J449 Chronic obstructive pulmonary disease, unspecified: Secondary | ICD-10-CM | POA: Insufficient documentation

## 2021-05-29 DIAGNOSIS — I10 Essential (primary) hypertension: Secondary | ICD-10-CM

## 2021-05-29 DIAGNOSIS — I509 Heart failure, unspecified: Secondary | ICD-10-CM | POA: Insufficient documentation

## 2021-05-29 DIAGNOSIS — E1140 Type 2 diabetes mellitus with diabetic neuropathy, unspecified: Secondary | ICD-10-CM | POA: Diagnosis not present

## 2021-05-29 DIAGNOSIS — Z79899 Other long term (current) drug therapy: Secondary | ICD-10-CM | POA: Diagnosis not present

## 2021-05-29 DIAGNOSIS — Z7982 Long term (current) use of aspirin: Secondary | ICD-10-CM | POA: Diagnosis not present

## 2021-05-29 DIAGNOSIS — I110 Hypertensive heart disease with heart failure: Secondary | ICD-10-CM | POA: Diagnosis not present

## 2021-05-29 DIAGNOSIS — Z87891 Personal history of nicotine dependence: Secondary | ICD-10-CM | POA: Diagnosis not present

## 2021-05-29 LAB — BASIC METABOLIC PANEL
Anion gap: 7 (ref 5–15)
BUN: 11 mg/dL (ref 8–23)
CO2: 36 mmol/L — ABNORMAL HIGH (ref 22–32)
Calcium: 9.1 mg/dL (ref 8.9–10.3)
Chloride: 99 mmol/L (ref 98–111)
Creatinine, Ser: 0.58 mg/dL (ref 0.44–1.00)
GFR, Estimated: >60 mL/min (ref >60)
Glucose, Bld: 124 mg/dL — ABNORMAL HIGH (ref 70–99)
Potassium: 4.2 mmol/L (ref 3.5–5.1)
Sodium: 142 mmol/L (ref 135–145)

## 2021-05-29 LAB — CBC
HCT: 33.2 % — ABNORMAL LOW (ref 36.0–46.0)
Hemoglobin: 9.7 g/dL — ABNORMAL LOW (ref 12.0–15.0)
MCH: 28.4 pg (ref 26.0–34.0)
MCHC: 29.2 g/dL — ABNORMAL LOW (ref 30.0–36.0)
MCV: 97.4 fL (ref 80.0–100.0)
Platelets: 204 10*3/uL (ref 150–400)
RBC: 3.41 MIL/uL — ABNORMAL LOW (ref 3.87–5.11)
RDW: 13.7 % (ref 11.5–15.5)
WBC: 11 10*3/uL — ABNORMAL HIGH (ref 4.0–10.5)
nRBC: 0 % (ref 0.0–0.2)

## 2021-05-29 LAB — TROPONIN I (HIGH SENSITIVITY): Troponin I (High Sensitivity): 10 ng/L (ref <18)

## 2021-05-29 MED ORDER — ISOSORBIDE MONONITRATE ER 30 MG PO TB24: 30.0000 mg | ORAL_TABLET | Freq: Every day | ORAL | 0 refills | Status: DC | PRN

## 2021-05-29 NOTE — ED Notes (Signed)
See triage note  Presents with h/a and concerns that her b/p was elevated yesterday   Denies nay chest pain or other concerns

## 2021-05-29 NOTE — ED Triage Notes (Signed)
Pt comes into the ED via POV c/o hypertension with systolic over 200 at home.  Pt denies any chest pain, increased shortness of breath, or dizziness.  Pt does admit to headache.  Pt chronically wears 3L at baseline. Pt currently in NAD.

## 2021-05-29 NOTE — ED Provider Notes (Signed)
[facility] Emergency Department Provider Note   ____________________________________________   Event Date/Time   First MD Initiated Contact with Patient 05/29/21 [phone]     (approximate)  I have reviewed the triage vital signs and the nursing notes.   HISTORY  Chief Complaint Hypertension    HPI RIM THATCH is a 80 y.o. female with below stated past medical history presents for hypertension  LOCATION: Generalized DURATION: 24 hours TIMING: Improved since onset SEVERITY: Moderate QUALITY: Hypertension CONTEXT: Patient states that she was feeling a mild throbbing headache and took her blood pressure that was found to have systolics in the 683M MODIFYING FACTORS: Denies any exacerbating or relieving factors and states that she has taken all of her medications on time and as prescribed ASSOCIATED SYMPTOMS: Mild headache   Per medical record review, patient does have history of difficult to control hypertension          Past Medical History:  Diagnosis Date   Arthritis    COPD (chronic obstructive pulmonary disease) ([place])    Diabetes mellitus without complication ([place])    Dysrhythmia    Heart murmur    History of orthopnea    Hypertension    Hypothyroidism    Neuropathy    Oxygen dependent    3L  CONTINUOUS   Pain CHRONIC BACK PAIN   Shortness of breath dyspnea    Wheezing     Patient Active Problem List   Diagnosis Date Noted   Depression, major, single episode, complete remission ([place]) 05/20/2020   Incarcerated hernia of abdominal cavity 08/04/2018   SBO (small bowel obstruction) ([place]) 04/22/2018   Incarcerated hernia 04/14/2018   Aneurysm ([place]) 07/10/2016   Nonspecific abnormal finding 05/11/2015   Allergic rhinitis 05/11/2015   Anxiety 05/11/2015   Bronchitis, chronic ([place]) 05/11/2015   CCF (congestive cardiac failure) ([place]) 05/11/2015   Clinical depression 05/11/2015   DDD (degenerative disc disease), lumbosacral  05/11/2015   Essential (primary) hypertension 05/11/2015   Acid reflux 05/11/2015   HLD (hyperlipidemia) 05/11/2015   Adult hypothyroidism 05/11/2015   Cervical dysplasia, mild 05/11/2015   Neuropathy 05/11/2015   Adiposity 05/11/2015   Obstructive apnea 05/11/2015   Arthritis, degenerative 05/11/2015    Past Surgical History:  Procedure Laterality Date   ABDOMINAL HYSTERECTOMY     BOWEL RESECTION N/A 04/14/2018   Procedure: SMALL BOWEL RESECTION;  Surgeon: Jules Husbands, MD;  Location: [facility] ORS;  Service: General;  Laterality: N/A;   BREAST BIOPSY Right    benign   CATARACT EXTRACTION W/PHACO Left 07/03/2016   Procedure: CATARACT EXTRACTION PHACO AND INTRAOCULAR LENS PLACEMENT ([place]);  Surgeon: Birder Robson, MD;  Location: [facility] ORS;  Service: Ophthalmology;  Laterality: Left;  Lot: [id number] H [place]: 00:40.1 AP%: 17.4 CDE:6.94   HERNIA REPAIR     LAPAROTOMY N/A 09/04/2018   Procedure: EXPLORATORY LAPAROTOMY;  Surgeon: Olean Ree, MD;  Location: [facility] ORS;  Service: General;  Laterality: N/A;   TUBAL LIGATION     VENTRAL HERNIA REPAIR N/A 04/14/2018   Procedure: HERNIA REPAIR VENTRAL ADULT;  Surgeon: Jules Husbands, MD;  Location: [facility] ORS;  Service: General;  Laterality: N/A;    Prior to Admission medications   Medication Sig Start Date End Date Taking? Authorizing Provider  acetaminophen (TYLENOL) 500 MG tablet Take 500 mg by mouth every 6 (six) hours as needed.    [provider]  aspirin EC 81 MG tablet Take 81 mg by mouth daily.    [provider]  azithromycin (  ZITHROMAX) 250 MG tablet TAKE 2 TABLETS BY MOUTH TODAY, THEN TAKE 1 TABLET DAILY FOR 4 DAYS 03/14/21   Jerrol Banana., MD  blood glucose meter kit and supplies KIT Dispense based on patient and insurance preference. Use up to four times daily as directed. (FOR ICD-9 250.00, 250.01). 06/27/18   Jerrol Banana., MD  Blood Glucose Monitoring Suppl (ONE TOUCH ULTRA 2) w/Device KIT 1 each by  Does not apply route daily. 08/27/18   Jerrol Banana., MD  cetirizine (ZYRTEC) 10 MG tablet TAKE 1 TABLET BY MOUTH EVERY DAY 03/31/21   Jerrol Banana., MD  enalapril (VASOTEC) 20 MG tablet TAKE 1 TABLET BY MOUTH EVERY DAY 04/07/21   Jerrol Banana., MD  furosemide (LASIX) 20 MG tablet Take 2 tablets (40 mg total) by mouth daily. 02/14/21   Jerrol Banana., MD  gabapentin (NEURONTIN) 300 MG capsule TAKE 2 CAPSULES IN THE MORNING, 1 CAPSULE IN THE AFTERNOON AND 2 CAPSULES IN THE EVENING 02/14/21   Jerrol Banana., MD  ipratropium-albuterol (DUONEB) 0.5-2.5 (3) MG/3ML SOLN INHALE 1 VIAL VIA NEBULIZER BY MOUTH 3 TIMES A DAY AS NEEDED FOR COPD/ASTHMA 04/12/21   Lavera Guise, MD  isosorbide mononitrate (IMDUR) 30 MG 24 hr tablet Take 1 tablet (30 mg total) by mouth daily as needed (high bp). 05/29/21   Naaman Plummer, MD  levothyroxine (SYNTHROID) 125 MCG tablet TAKE 1 TABLET BY MOUTH DAILY 03/31/21   Jerrol Banana., MD  meclizine (ANTIVERT) 12.5 MG tablet Take 1 tablet (12.5 mg total) by mouth 3 (three) times daily as needed for dizziness. Patient not taking: No sig reported 11/23/19   Virginia Crews, MD  mometasone-formoterol (DULERA) 100-5 MCG/ACT AERO Inhale 2 puffs into the lungs 2 (two) times daily.    [provider]  MULTIPLE VITAMIN PO Take 1 tablet by mouth daily.     [provider]  nirmatrelvir/ritonavir EUA (PAXLOVID) TABS (Take nirmatrelvir 150 mg two tablets twice daily for 5 days and ritonavir 100 mg one tablet twice daily for 5 days) Patient GFR is 87. 05/26/21   Jerrol Banana., MD  omeprazole (PRILOSEC) 40 MG capsule TAKE 1 CAPSULE BY MOUTH EVERY DAY 04/07/21   Jerrol Banana., MD  [facility] ULTRA test strip USE 1 STRIP TO TEST GLUCOSE TWICE DAILY AS NEEDED 07/20/19   Jerrol Banana., MD  OXYGEN Inhale 4 L into the lungs.    [provider]  phenylephrine (NEO-SYNEPHRINE) 1 % nasal spray Place 1 drop  into both nostrils every 6 (six) hours as needed for congestion.    [provider]  sertraline (ZOLOFT) 50 MG tablet Take 1 tablet (50 mg total) by mouth daily. 02/14/21   Jerrol Banana., MD  tiotropium (SPIRIVA) 18 MCG inhalation capsule Place 1 capsule (18 mcg total) into inhaler and inhale daily. Patient not taking: No sig reported 12/29/20   Luiz Ochoa, NP    Allergies Codeine  Family History  Problem Relation Age of Onset   Heart disease Mother    Drug abuse Other    Hypertension Other     Social History Social History   Tobacco Use   Smoking status: Former    Packs/day: 0.50    Years: 15.00    Pack years: 7.50    Types: Cigarettes   Smokeless tobacco: Never   Tobacco comments:    30-40 years ago  Vaping Use   Vaping Use: Never used  Substance Use Topics   Alcohol use: No    Alcohol/week: 0.0 standard drinks   Drug use: No    Review of Systems Constitutional: No fever/chills Eyes: No visual changes. ENT: No sore throat. Cardiovascular: Denies chest pain. Respiratory: Denies shortness of breath. Gastrointestinal: No abdominal pain.  No nausea, no vomiting.  No diarrhea. Genitourinary: Negative for dysuria. Musculoskeletal: Negative for acute arthralgias Skin: Negative for rash. Neurological: Positive for headaches, denies weakness/numbness/paresthesias in any extremity Psychiatric: Negative for suicidal ideation/homicidal ideation   ____________________________________________   PHYSICAL EXAM:  VITAL SIGNS: ED Triage Vitals [05/29/21 0630]  Enc Vitals Group     BP (!) 155/73     Pulse Rate 91     Resp 17     Temp 98.7 F (37.1 C)     Temp Source Oral     SpO2 98 %     Weight 204 lb 9.4 oz (92.8 kg)     Height '5\' 7"'  (1.702 m)     Head Circumference      Peak Flow      Pain Score 9     Pain Loc      Pain Edu?      Excl. in [place]?    Constitutional: Alert and oriented. Well appearing and in no acute distress. Eyes:  Conjunctivae are normal. PERRL. Head: Atraumatic. Nose: No congestion/rhinnorhea. Mouth/Throat: Mucous membranes are moist. Neck: No stridor Cardiovascular: Grossly normal heart sounds.  Good peripheral circulation. Respiratory: Normal respiratory effort.  No retractions. Gastrointestinal: Soft and nontender. No distention. Musculoskeletal: No obvious deformities Neurologic:  Normal speech and language. No gross focal neurologic deficits are appreciated. Skin:  Skin is warm and dry. No rash noted. Psychiatric: Mood and affect are normal. Speech and behavior are normal.  ____________________________________________   LABS (all labs ordered are listed, but only abnormal results are displayed)  Labs Reviewed  BASIC METABOLIC PANEL - Abnormal; Notable for the following components:      Result Value   CO2 36 (*)    Glucose, Bld 124 (*)    All other components within normal limits  CBC - Abnormal; Notable for the following components:   WBC 11.0 (*)    RBC 3.41 (*)    Hemoglobin 9.7 (*)    HCT 33.2 (*)    MCHC 29.2 (*)    All other components within normal limits  TROPONIN I (HIGH SENSITIVITY)  TROPONIN I (HIGH SENSITIVITY)   ____________________________________________  EKG  ED ECG REPORT I, Naaman Plummer, the attending physician, personally viewed and interpreted this ECG.  Date: 05/29/2021 EKG Time: 0636 Rate: 82 Rhythm: normal sinus rhythm QRS Axis: normal Intervals: normal ST/T Wave abnormalities: normal Narrative Interpretation: no evidence of acute ischemia  PROCEDURES  Procedure(s) performed (including Critical Care):  .1-3 Lead EKG Interpretation  Date/Time: 05/29/2021 10:14 AM Performed by: Naaman Plummer, MD Authorized by: Naaman Plummer, MD     Interpretation: normal     ECG rate:  87   ECG rate assessment: normal     Rhythm: sinus rhythm     Ectopy: none     Conduction: normal     ____________________________________________   INITIAL  IMPRESSION / ASSESSMENT AND PLAN / ED COURSE  As part of my medical decision making, I reviewed the following data within the electronic medical record, if available:  Nursing notes reviewed and incorporated, Labs reviewed, EKG interpreted, Old chart reviewed, Radiograph reviewed and Notes  from prior ED visits reviewed and incorporated        Presents to the emergency department complaining of high blood pressure. Patient is otherwise asymptomatic without confusion, chest pain, hematuria, or SOB. Denies nonadherence to antihypertensive regimen DDx: CV, AMI, heart failure, renal infarction or failure or other end organ damage. Rx: Imdur 56m as needed Disposition: Discussed with patient their elevated blood pressure and need for close outpatient management of their hypertension. Will provide a prescription for the patients previous antihypertensive medication and arrange for the patient to follow up in a primary care clinic      ____________________________________________   FINAL CLINICAL IMPRESSION(S) / ED DIAGNOSES  Final diagnoses:  Primary hypertension     ED Discharge Orders          Ordered    isosorbide mononitrate (IMDUR) 30 MG 24 hr tablet  Daily PRN        05/29/21 1011             Note:  This document was prepared using Dragon voice recognition software and may include unintentional dictation errors.    BNaaman Plummer MD 05/29/21 1017

## 2021-05-31 ENCOUNTER — Telehealth: Payer: Self-pay | Admitting: Family Medicine

## 2021-05-31 NOTE — Telephone Encounter (Signed)
Patient was seen in the ER on 05/29/21. She has an appointment with Dr Sullivan Lone on 06/27/21. She wanted to know if she needs to be seen for a follow-up appointment before then. If so, she would like the appointment to be virtual.

## 2021-05-31 NOTE — Telephone Encounter (Signed)
Please advise if patient needs to be seen sooner. Nothing available. Thanks!

## 2021-06-01 DIAGNOSIS — J449 Chronic obstructive pulmonary disease, unspecified: Secondary | ICD-10-CM | POA: Diagnosis not present

## 2021-06-05 ENCOUNTER — Telehealth: Payer: Self-pay

## 2021-06-05 ENCOUNTER — Other Ambulatory Visit: Payer: Self-pay | Admitting: Family Medicine

## 2021-06-05 DIAGNOSIS — R600 Localized edema: Secondary | ICD-10-CM

## 2021-06-05 NOTE — Telephone Encounter (Signed)
No message or request.

## 2021-06-14 DIAGNOSIS — J449 Chronic obstructive pulmonary disease, unspecified: Secondary | ICD-10-CM | POA: Diagnosis not present

## 2021-06-15 ENCOUNTER — Ambulatory Visit: Payer: Self-pay | Admitting: *Deleted

## 2021-06-15 DIAGNOSIS — J449 Chronic obstructive pulmonary disease, unspecified: Secondary | ICD-10-CM | POA: Diagnosis not present

## 2021-06-15 NOTE — Telephone Encounter (Signed)
  Pt called back at 1615. States "Got batteries for my monitor, reports BP 155/84  HR 79. Reiterated need to schedule appt, states will check with daughter Saturday.

## 2021-06-15 NOTE — Telephone Encounter (Signed)
Pt called stating that she has been experiencing dizziness and nausea x2-3 days. She states that she is needing to speak with someone to help manage symptoms. Please advise.    Reason for Disposition  Taking a medicine that could cause dizziness (e.g., blood pressure medications, diuretics)  Answer Assessment - Initial Assessment Questions 1. DESCRIPTION: "Describe your dizziness."     Lightheaded 2. LIGHTHEADED: "Do you feel lightheaded?" (e.g., somewhat faint, woozy, weak upon standing)     yes 3. VERTIGO: "Do you feel like either you or the room is spinning or tilting?" (i.e. vertigo)     no 4. SEVERITY: "How bad is it?"  "Do you feel like you are going to faint?" "Can you stand and walk?"   - MILD: Feels slightly dizzy, but walking normally.   - MODERATE: Feels unsteady when walking, but not falling; interferes with normal activities (e.g., school, work).   - SEVERE: Unable to walk without falling, or requires assistance to walk without falling; feels like passing out now.      Moderate at times 5. ONSET:  "When did the dizziness begin?"     2-3 days ago 6. AGGRAVATING FACTORS: "Does anything make it worse?" (e.g., standing, change in head position)     Sitting to standing 7. HEART RATE: "Can you tell me your heart rate?" "How many beats in 15 seconds?"  (Note: not all patients can do this)       no 8. CAUSE: "What do you think is causing the dizziness?"     Unsure 9. RECURRENT SYMPTOM: "Have you had dizziness before?" If Yes, ask: "When was the last time?" "What happened that time?"     Vertigo, had before 10. OTHER SYMPTOMS: "Do you have any other symptoms?" (e.g., fever, chest pain, vomiting, diarrhea, bleeding)  Protocols used: Dizziness - Lightheadedness-A-AH

## 2021-06-15 NOTE — Telephone Encounter (Signed)
Pt reports lightheadedness and dizziness, onset 2-3 days ago. States dizziness positional with sitting to standing, "I always hold onto things anyway, mostly in a wheelchair."  Reports nausea onset day before dizziness occurred. No spinning, does report H/O vertigo. Seen in ED 05/29/21 "My home BP monitor read really high. When I got there it was not (155/73) States I think my monitor is off. Received Imdur 30 mg to take PRN. States she was taking one daily (has 4 tabs left ) because she had a headache, but did not know what her BP was. Last took Sunday. Denies any vomiting with the nausea "Mostly in mornings."  Pt states can not come in for appt until daughter gets here Saturday. Declines UC. States she will see if local daughter will secure another monitor or have her's checked. States will CB when she has monitor. Reiterated need to be seen, have BP checked, states  will check with daughter. Did give care advise per dizziness protocol. Verbalizes understanding.  CB# [phone]

## 2021-06-27 ENCOUNTER — Other Ambulatory Visit: Payer: Self-pay

## 2021-06-27 ENCOUNTER — Ambulatory Visit (INDEPENDENT_AMBULATORY_CARE_PROVIDER_SITE_OTHER): Payer: Medicare Other | Admitting: Family Medicine

## 2021-06-27 ENCOUNTER — Encounter: Payer: Self-pay | Admitting: Family Medicine

## 2021-06-27 VITALS — BP 113/73 | HR 89 | Resp 18 | Ht 67.0 in | Wt 200.0 lb

## 2021-06-27 DIAGNOSIS — Z9981 Dependence on supplemental oxygen: Secondary | ICD-10-CM

## 2021-06-27 DIAGNOSIS — J9611 Chronic respiratory failure with hypoxia: Secondary | ICD-10-CM | POA: Diagnosis not present

## 2021-06-27 DIAGNOSIS — J9612 Chronic respiratory failure with hypercapnia: Secondary | ICD-10-CM | POA: Diagnosis not present

## 2021-06-27 DIAGNOSIS — I5042 Chronic combined systolic (congestive) and diastolic (congestive) heart failure: Secondary | ICD-10-CM

## 2021-06-27 DIAGNOSIS — I10 Essential (primary) hypertension: Secondary | ICD-10-CM | POA: Diagnosis not present

## 2021-06-27 DIAGNOSIS — E113299 Type 2 diabetes mellitus with mild nonproliferative diabetic retinopathy without macular edema, unspecified eye: Secondary | ICD-10-CM | POA: Diagnosis not present

## 2021-06-27 DIAGNOSIS — E039 Hypothyroidism, unspecified: Secondary | ICD-10-CM | POA: Diagnosis not present

## 2021-06-27 DIAGNOSIS — E7849 Other hyperlipidemia: Secondary | ICD-10-CM

## 2021-06-27 DIAGNOSIS — F325 Major depressive disorder, single episode, in full remission: Secondary | ICD-10-CM

## 2021-06-27 DIAGNOSIS — I509 Heart failure, unspecified: Secondary | ICD-10-CM

## 2021-06-27 DIAGNOSIS — R42 Dizziness and giddiness: Secondary | ICD-10-CM

## 2021-06-27 MED ORDER — MECLIZINE HCL 25 MG PO TABS: 25.0000 mg | ORAL_TABLET | Freq: Three times a day (TID) | ORAL | 4 refills | Status: AC | PRN

## 2021-06-27 NOTE — Progress Notes (Signed)
I,April Miller,acting as a scribe for Wilhemena Durie, MD.,have documented all relevant documentation on the behalf of Wilhemena Durie, MD,as directed by  Wilhemena Durie, MD while in the presence of Wilhemena Durie, MD.   Established patient visit   Patient: Kristen Schroeder   DOB: 1941-09-12   80 y.o. Female  MRN: [id number] Visit Date: 06/27/2021  Today's healthcare provider: Wilhemena Durie, MD   Chief Complaint  Patient presents with   Follow-up   Hypertension   Subjective  --------------------------------------------------------------------------------------  HPI  Patient comes in today for follow-up.  She is on home oxygen. She is in a wheelchair in the office but walks at home.  She has chronic vertigo and is helped by meclizine.  She has trouble at home as her husband has had a stroke probably has lung cancer and also cannot see.  She discusses that he needs some help at home. Hypertension, follow-up  BP Readings from Last 3 Encounters:  06/27/21 113/73  05/29/21 (!) 156/70  04/04/21 132/64   Wt Readings from Last 3 Encounters:  06/27/21 200 lb (90.7 kg)  05/29/21 204 lb 9.4 oz (92.8 kg)  04/04/21 204 lb 9.6 oz (92.8 kg)     She was last seen for hypertension 1 months ago.  BP at that visit was 156/71. Management since that visit includes no medication changes.  She reports good compliance with treatment. She is not having side effects. none She is following a Regular, Low Sodium diet. She is not exercising. She does not smoke.  Use of agents associated with hypertension: none.   Outside blood pressures are 137/72.   Pertinent labs: Lab Results  Component Value Date   CHOL 183 12/15/2019   HDL 58 12/15/2019   LDLCALC 104 (H) 12/15/2019   TRIG 117 12/15/2019   CHOLHDL 3.2 12/15/2019   Lab Results  Component Value Date   NA 142 05/29/2021   K 4.2 05/29/2021   CREATININE 0.58 05/29/2021   GFRNONAA >60 05/29/2021   GFRAA 112  06/28/2020   GLUCOSE 124 (H) 05/29/2021     The ASCVD Risk score (Goff DC Jr., et al., 2013) failed to calculate for the following reasons:   The 2013 ASCVD risk score is only valid for ages 76 to 67   --------------------------------------------------------------------------------------       Medications: Outpatient Medications Prior to Visit  Medication Sig   acetaminophen (TYLENOL) 500 MG tablet Take 500 mg by mouth every 6 (six) hours as needed.   aspirin EC 81 MG tablet Take 81 mg by mouth daily.   cetirizine (ZYRTEC) 10 MG tablet TAKE 1 TABLET BY MOUTH EVERY DAY   enalapril (VASOTEC) 20 MG tablet TAKE 1 TABLET BY MOUTH EVERY DAY   furosemide (LASIX) 20 MG tablet TAKE 2 TABLETS (40MG TOTAL) BY MOUTH EVERY DAY   gabapentin (NEURONTIN) 300 MG capsule TAKE 2 CAPSULES IN THE MORNING, 1 CAPSULE IN THE AFTERNOON AND 2 CAPSULES IN THE EVENING   ipratropium-albuterol (DUONEB) 0.5-2.5 (3) MG/3ML SOLN INHALE 1 VIAL VIA NEBULIZER BY MOUTH 3 TIMES A DAY AS NEEDED FOR COPD/ASTHMA   isosorbide mononitrate (IMDUR) 30 MG 24 hr tablet Take 1 tablet (30 mg total) by mouth daily as needed (high bp).   levothyroxine (SYNTHROID) 125 MCG tablet TAKE 1 TABLET BY MOUTH DAILY   MULTIPLE VITAMIN PO Take 1 tablet by mouth daily.    omeprazole (PRILOSEC) 40 MG capsule TAKE 1 CAPSULE BY MOUTH EVERY DAY  ondansetron (ZOFRAN-ODT) 8 MG disintegrating tablet Take by mouth.   OXYGEN Inhale 4 L into the lungs.   phenylephrine (NEO-SYNEPHRINE) 1 % nasal spray Place 1 drop into both nostrils every 6 (six) hours as needed for congestion.   sertraline (ZOLOFT) 50 MG tablet Take 1 tablet (50 mg total) by mouth daily.   meclizine (ANTIVERT) 12.5 MG tablet Take 1 tablet (12.5 mg total) by mouth 3 (three) times daily as needed for dizziness. (Patient not taking: No sig reported)   nirmatrelvir/ritonavir EUA (PAXLOVID) TABS (Take nirmatrelvir 150 mg two tablets twice daily for 5 days and ritonavir 100 mg one tablet twice  daily for 5 days) Patient GFR is 87. (Patient not taking: Reported on 06/27/2021)   [DISCONTINUED] azithromycin (ZITHROMAX) 250 MG tablet TAKE 2 TABLETS BY MOUTH TODAY, THEN TAKE 1 TABLET DAILY FOR 4 DAYS   [DISCONTINUED] blood glucose meter kit and supplies KIT Dispense based on patient and insurance preference. Use up to four times daily as directed. (FOR ICD-9 250.00, 250.01).   [DISCONTINUED] Blood Glucose Monitoring Suppl (ONE TOUCH ULTRA 2) w/Device KIT 1 each by Does not apply route daily.   [DISCONTINUED] mometasone-formoterol (DULERA) 100-5 MCG/ACT AERO Inhale 2 puffs into the lungs 2 (two) times daily. (Patient not taking: Reported on 06/27/2021)   [DISCONTINUED] ONETOUCH ULTRA test strip USE 1 STRIP TO TEST GLUCOSE TWICE DAILY AS NEEDED (Patient not taking: Reported on 06/27/2021)   [DISCONTINUED] tiotropium (SPIRIVA) 18 MCG inhalation capsule Place 1 capsule (18 mcg total) into inhaler and inhale daily. (Patient not taking: No sig reported)   No facility-administered medications prior to visit.    Review of Systems  Constitutional:  Negative for appetite change, chills, fatigue and fever.  Respiratory:  Negative for chest tightness and shortness of breath.   Cardiovascular:  Negative for chest pain and palpitations.  Gastrointestinal:  Negative for abdominal pain, nausea and vomiting.  Neurological:  Negative for dizziness and weakness.       Objective  -------------------------------------------------------------------------------------------------------------------- BP 113/73 (BP Location: Left Arm, Patient Position: Sitting, Cuff Size: Normal)   Pulse 89   Resp 18   Ht 5' 7" (1.702 m)   Wt 200 lb (90.7 kg)   SpO2 92%   BMI 31.32 kg/m  BP Readings from Last 3 Encounters:  06/27/21 113/73  05/29/21 (!) 156/70  04/04/21 132/64   Wt Readings from Last 3 Encounters:  06/27/21 200 lb (90.7 kg)  05/29/21 204 lb 9.4 oz (92.8 kg)  04/04/21 204 lb 9.6 oz (92.8 kg)        Physical Exam Vitals and nursing note reviewed.  Constitutional:      Appearance: Normal appearance. She is normal weight.  HENT:     Right Ear: Tympanic membrane normal.     Left Ear: Tympanic membrane normal.     Nose: Nose normal.     Mouth/Throat:     Mouth: Mucous membranes are moist.     Pharynx: Oropharynx is clear.  Eyes:     General: No scleral icterus.    Conjunctiva/sclera: Conjunctivae normal.  Cardiovascular:     Rate and Rhythm: Normal rate and regular rhythm.     Pulses: Normal pulses.     Heart sounds: Normal heart sounds.  Pulmonary:     Effort: Pulmonary effort is normal.     Breath sounds: Normal breath sounds.  Abdominal:     Palpations: Abdomen is soft.     Tenderness: There is no abdominal tenderness.  Musculoskeletal:       Cervical back: Normal range of motion and neck supple.  Skin:    General: Skin is warm and dry.  Neurological:     General: No focal deficit present.     Mental Status: She is alert and oriented to person, place, and time.     Comments: She has 5 out of 5 strength in her upper extremities and 5-/5 strength in her lower extremities.  Psychiatric:        Mood and Affect: Mood normal.        Behavior: Behavior normal.        Thought Content: Thought content normal.        Judgment: Judgment normal.      No results found for any visits on 06/27/21.  Assessment & Plan  ---------------------------------------------------------------------------------------------------------------------- 1. Congestive heart failure, unspecified HF chronicity, unspecified heart failure type (HCC) Clinically stable. Consult social work from [facility] to see if they can get any help in the home with both she and her husband in poor health - AMB Referral to Community Care Coordinaton  2. Essential (primary) hypertension Good control.  On Lasix isosorbide enalapril - CBC with Differential/Platelet - Comprehensive metabolic panel - AMB Referral to Community  Care Coordinaton  3. Type 2 diabetes mellitus with mild nonproliferative retinopathy without macular edema, without long-term current use of insulin, unspecified laterality (HCC) Last A1c was 4.9.  This is now controlled with her weight loss - Hemoglobin A1c - Lipid panel - TSH - AMB Referral to Community Care Coordinaton  4. Depression, major, single episode, complete remission (HCC) On sertraline.  Continue this indefinitely - AMB Referral to Community Care Coordinaton  5. Oxygen dependent Followed by pulmonary - AMB Referral to Community Care Coordinaton  6. Adult hypothyroidism  - AMB Referral to Community Care Coordinaton  7. Chronic respiratory failure with hypoxia and hypercapnia (HCC) Again, on oxygen and followed by pulmonary - AMB Referral to Community Care Coordinaton  8. Chronic combined systolic and diastolic congestive heart failure (HCC)  - AMB Referral to Community Care Coordinaton  9. Other hyperlipidemia Last LDL 104. - Lipid panel - TSH  10. Vertigo Refill meclizine as this helps. - meclizine (ANTIVERT) 25 MG tablet; Take 1 tablet (25 mg total) by mouth 3 (three) times daily as needed for dizziness.  Dispense: 90 tablet; Refill: 4  11. Chronic vertigo  - AMB Referral to Community Care Coordinaton   No follow-ups on file.      I, Richard Gilbert Jr, MD, have reviewed all documentation for this visit. The documentation on 07/03/21 for the exam, diagnosis, procedures, and orders are all accurate and complete.    Richard Gilbert Jr, MD  [facility] [phone] (phone) [phone] (fax)  [facility]  

## 2021-06-28 ENCOUNTER — Telehealth: Payer: Self-pay | Admitting: *Deleted

## 2021-06-28 ENCOUNTER — Other Ambulatory Visit: Payer: Self-pay | Admitting: Family Medicine

## 2021-06-28 NOTE — Telephone Encounter (Signed)
Requested Prescriptions  Pending Prescriptions Disp Refills  . levothyroxine (SYNTHROID) 125 MCG tablet [Pharmacy Med Name: LEVOTHYROXINE 125 MCG TABLET] 90 tablet 1    Sig: TAKE 1 TABLET BY MOUTH EVERY DAY     Endocrinology:  Hypothyroid Agents Failed - 06/28/2021  1:20 AM      Failed - TSH needs to be rechecked within 3 months after an abnormal result. Refill until TSH is due.      Failed - TSH in normal range and within 360 days    TSH  Date Value Ref Range Status  06/28/2020 1.330 0.450 - 4.500 uIU/mL Final         Passed - Valid encounter within last 12 months    Recent Outpatient Visits          Yesterday Congestive heart failure, unspecified HF chronicity, unspecified heart failure type [facility])   [facility] Maple Hudson., MD   3 months ago Congestive heart failure, unspecified HF chronicity, unspecified heart failure type [facility])   [facility] Maple Hudson., MD   5 months ago Chronic respiratory failure with hypoxia and hypercapnia [facility])   [facility] Maple Hudson., MD   6 months ago Allergic rhinitis, unspecified seasonality, unspecified trigger   [facility] Maple Hudson., MD   8 months ago Pulmonary fibrosis [facility])   [facility] Maple Hudson., MD      Future Appointments            In 4 months Maple Hudson., MD [facility]

## 2021-06-28 NOTE — Chronic Care Management (AMB) (Signed)
  Chronic Care Management   Outreach Note  06/28/2021 Name: Kristen Schroeder MRN: [id number] DOB: 1941-07-13  Kristen Schroeder is a 80 y.o. year old female who is a primary care patient of Maple Hudson., MD. I reached out to Kristen Schroeder by phone today in response to a referral sent by Kristen Schroeder's PCP Maple Hudson., MD     An unsuccessful telephone outreach was attempted today. The patient was referred to the case management team for assistance with care management and care coordination.   Follow Up Plan: A HIPAA compliant phone message was left for the patient providing contact information and requesting a return call.  If patient returns call to provider office, please advise to call Embedded Care Management Care Guide Kristen Schroeder at [phone]  Kristen Schroeder, CCMA Care Guide, Embedded Care Coordination [facility]  Care Management  Direct Dial: [phone]

## 2021-06-30 NOTE — Chronic Care Management (AMB) (Signed)
  Chronic Care Management   Outreach Note  06/30/2021 Name: Kristen Schroeder MRN: [id number] DOB: 07/04/1941  Kristen Schroeder is a 80 y.o. year old female who is a primary care patient of Maple Hudson., MD. I reached out to Kristen Schroeder by phone today in response to a referral sent by Ms. Lindsey L Boldon's PCP Maple Hudson., MD     A second unsuccessful telephone outreach was attempted today. The patient was referred to the case management team for assistance with care management and care coordination.   Follow Up Plan: A HIPAA compliant phone message was left for the patient providing contact information and requesting a return call.  If patient returns call to provider office, please advise to call Embedded Care Management Care Guide Sarissa Dern at [phone]  Burman Nieves, CCMA Care Guide, Embedded Care Coordination [facility]  Care Management  Direct Dial: [phone]

## 2021-07-02 DIAGNOSIS — J449 Chronic obstructive pulmonary disease, unspecified: Secondary | ICD-10-CM | POA: Diagnosis not present

## 2021-07-04 NOTE — Chronic Care Management (AMB) (Signed)
  Chronic Care Management   Note  07/04/2021 Name: MARILENA TREVATHAN MRN: [id number] DOB: [birth year]  [name] is a 80 y.o. year old female who is a primary care patient of Jerrol Banana., MD. I reached out to [name] by phone today in response to a referral sent by Ms. Dakiyah L Tyminski's PCP Jerrol Banana., MD     Ms. Bacote was given information about Chronic Care Management services today including:  CCM service includes personalized support from designated clinical staff supervised by her physician, including individualized plan of care and coordination with other care providers 24/7 contact phone numbers for assistance for urgent and routine care needs. Service will only be billed when office clinical staff spend 20 minutes or more in a month to coordinate care. Only one practitioner may furnish and bill the service in a calendar month. The patient may stop CCM services at any time (effective at the end of the month) by phone call to the office staff. The patient will be responsible for cost sharing (co-pay) of up to 20% of the service fee (after annual deductible is met).  Patient agreed to services and verbal consent obtained.   Follow up plan: Telephone appointment with care management team member scheduled for: 07/05/2021  Julian Hy, [place] Management  Direct Dial: [phone]

## 2021-07-05 ENCOUNTER — Ambulatory Visit (INDEPENDENT_AMBULATORY_CARE_PROVIDER_SITE_OTHER): Payer: Medicare Other | Admitting: *Deleted

## 2021-07-05 DIAGNOSIS — I10 Essential (primary) hypertension: Secondary | ICD-10-CM | POA: Diagnosis not present

## 2021-07-05 DIAGNOSIS — I509 Heart failure, unspecified: Secondary | ICD-10-CM | POA: Diagnosis not present

## 2021-07-05 DIAGNOSIS — E113299 Type 2 diabetes mellitus with mild nonproliferative diabetic retinopathy without macular edema, unspecified eye: Secondary | ICD-10-CM

## 2021-07-05 NOTE — Patient Instructions (Signed)
Visit Information  PATIENT GOALS:   Goals Addressed             This Visit's Progress    Find Help in My Community       Timeframe:  Long-Range Goal Priority:  Medium Start Date:   07/05/21                          Expected End Date:    12/2820                Follow Up Date 07/07/21    - begin a notebook of services in my neighborhood or community - follow-up on any referrals for help I am given - think ahead to make sure my need does not become an emergency - have a back-up plan - make a list of family or friends that I can call    Why is this important?   Knowing how and where to find help for yourself or family in your neighborhood and community is an important skill.  You will want to take some steps to learn how.    Notes:         [name] was given information about Care Management services by the embedded care coordination team including:  Care Management services include personalized support from designated clinical staff supervised by her physician, including individualized plan of care and coordination with other care providers 24/7 contact phone numbers for assistance for urgent and routine care needs. The patient may stop CCM services at any time (effective at the end of the month) by phone call to the office staff.  Patient agreed to services and verbal consent obtained.   The patient verbalized understanding of instructions, educational materials, and care plan provided today and declined offer to receive copy of patient instructions, educational materials, and care plan.   Telephone follow up appointment with care management team member scheduled for: 07/12/21   Kristen Czech, LCSW Clinical Social Worker  [facility] Care Management [phone]

## 2021-07-05 NOTE — Chronic Care Management (AMB) (Signed)
Chronic Care Management    Clinical Social Work Note  07/05/2021 Name: RAMI WADDLE MRN: [id number] DOB: 1941-05-24  [name] is a 80 y.o. year old female who is a primary care patient of Jerrol Banana., MD. The CCM team was consulted to assist the patient with chronic disease management and/or care coordination needs related to: [employer] .   Engaged with patient by telephone for initial visit in response to provider referral for social work chronic care management and care coordination services.   Consent to Services:  The patient was given the following information about Chronic Care Management services today, agreed to services, and gave verbal consent: 1. CCM service includes personalized support from designated clinical staff supervised by the primary care provider, including individualized plan of care and coordination with other care providers 2. 24/7 contact phone numbers for assistance for urgent and routine care needs. 3. Service will only be billed when office clinical staff spend 20 minutes or more in a month to coordinate care. 4. Only one practitioner may furnish and bill the service in a calendar month. 5.The patient may stop CCM services at any time (effective at the end of the month) by phone call to the office staff. 6. The patient will be responsible for cost sharing (co-pay) of up to 20% of the service fee (after annual deductible is met). Patient agreed to services and consent obtained.  Patient agreed to services and consent obtained.   Assessment: Review of patient past medical history, allergies, medications, and health status, including review of relevant consultants reports was performed today as part of a comprehensive evaluation and provision of chronic care management and care coordination services.     SDOH (Social Determinants of Health) assessments and interventions performed:  SDOH Interventions    Flowsheet Row Most Recent Value  SDOH  Interventions   SDOH Interventions for the Following Domains Financial Strain  Financial Strain Interventions Other (Comment)        Advanced Directives Status: Not addressed in this encounter.  CCM Care Plan  Allergies  Allergen Reactions   Codeine Nausea And Vomiting and Nausea Only    Outpatient Encounter Medications as of 07/05/2021  Medication Sig Note   acetaminophen (TYLENOL) 500 MG tablet Take 500 mg by mouth every 6 (six) hours as needed.    aspirin EC 81 MG tablet Take 81 mg by mouth daily.    cetirizine (ZYRTEC) 10 MG tablet TAKE 1 TABLET BY MOUTH EVERY DAY    enalapril (VASOTEC) 20 MG tablet TAKE 1 TABLET BY MOUTH EVERY DAY    furosemide (LASIX) 20 MG tablet TAKE 2 TABLETS (40MG TOTAL) BY MOUTH EVERY DAY    gabapentin (NEURONTIN) 300 MG capsule TAKE 2 CAPSULES IN THE MORNING, 1 CAPSULE IN THE AFTERNOON AND 2 CAPSULES IN THE EVENING    ipratropium-albuterol (DUONEB) 0.5-2.5 (3) MG/3ML SOLN INHALE 1 VIAL VIA NEBULIZER BY MOUTH 3 TIMES A DAY AS NEEDED FOR COPD/ASTHMA    isosorbide mononitrate (IMDUR) 30 MG 24 hr tablet Take 1 tablet (30 mg total) by mouth daily as needed (high bp).    levothyroxine (SYNTHROID) 125 MCG tablet TAKE 1 TABLET BY MOUTH EVERY DAY    meclizine (ANTIVERT) 25 MG tablet Take 1 tablet (25 mg total) by mouth 3 (three) times daily as needed for dizziness.    MULTIPLE VITAMIN PO Take 1 tablet by mouth daily.     nirmatrelvir/ritonavir EUA (PAXLOVID) TABS (Take nirmatrelvir 150 mg two tablets twice daily  for 5 days and ritonavir 100 mg one tablet twice daily for 5 days) Patient GFR is 87. (Patient not taking: Reported on 06/27/2021)    omeprazole (PRILOSEC) 40 MG capsule TAKE 1 CAPSULE BY MOUTH EVERY DAY    ondansetron (ZOFRAN-ODT) 8 MG disintegrating tablet Take by mouth.    OXYGEN Inhale 4 L into the lungs. 10/15/2018: Patient uses 3 to 3 1/2 liters continuously   phenylephrine (NEO-SYNEPHRINE) 1 % nasal spray Place 1 drop into both nostrils every 6 (six)  hours as needed for congestion.    sertraline (ZOLOFT) 50 MG tablet Take 1 tablet (50 mg total) by mouth daily.    No facility-administered encounter medications on file as of 07/05/2021.    Patient Active Problem List   Diagnosis Date Noted   Depression, major, single episode, complete remission ([place]) 05/20/2020   Incarcerated hernia of abdominal cavity 08/04/2018   SBO (small bowel obstruction) ([place]) 04/22/2018   Incarcerated hernia 04/14/2018   Aneurysm ([place]) 07/10/2016   Nonspecific abnormal finding 05/11/2015   Allergic rhinitis 05/11/2015   Anxiety 05/11/2015   Bronchitis, chronic ([place]) 05/11/2015   CCF (congestive cardiac failure) ([place]) 05/11/2015   Clinical depression 05/11/2015   DDD (degenerative disc disease), 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/11/2015   Essential (primary) hypertension 05/11/2015   Acid reflux 05/11/2015   HLD (hyperlipidemia) 05/11/2015   Adult hypothyroidism 05/11/2015   Cervical dysplasia, mild 05/11/2015   Neuropathy 05/11/2015   Adiposity 05/11/2015   Obstructive apnea 05/11/2015   Arthritis, degenerative 05/11/2015    Conditions to be addressed/monitored: CHF, HTN, and DMII; Limited social support  Care Plan : General Pharmacy (Adult)  Updates made by Vern Claude, LCSW since 07/05/2021 12:00 AM     Problem: CHL AMB "PATIENT-SPECIFIC PROBLEM"   Priority: Medium  Note:   CARE PLAN ENTRY (see longitudinal plan of care for additional care plan information)  Current Barriers:  Patient with HTN, COPD, and DM in need of assistance with connection to community resources  Knowledge deficits and need for support, education and care coordination related to community resources support  Limited social support  Clinical Goal(s)  Over the next 90 days, patient will work with care management team member to address concerns related to community resources for in home care   Interventions provided by LCSW:  Assessed patient's care coordination needs related to in  home care needs and discussed ongoing care management follow up  SDOH completed identifying need for no cost/low cost in home care Patient discussed having some physical limitations(not able to stand for long periods of time, back pain) and is in need of in home help Patient confirmed limited social support as her husband is now hospitalized, patient's daughter resides in [place],  is available during the week to assist with care but additional assistance is needed in her absence Provided patient with information about CAP services through the Department of Social Services as well as the Case Management Program through [facility]. Private pay options for in home care also discussed. Explored patient's Medicaid eligibility-does not qualify due to income Collaborated with appropriate community resources to possibly assist-Referral made to [employer] for their in home care/case management program(wait list) Confirmed with the Department of Social Services-patient needs to be a Medicaid recipient to qualify for CAP services Active listening / Reflection utilized   Emotional Support  and reassurance provided   Patient Self Care Activities & Deficits:  Patient is unable to independently navigate community resource options without care coordination support  Acknowledges  deficits and is motivated to resolve concern  Limited social support Self administers medications as prescribed Attends all scheduled provider appointments  Initial goal documentation        Follow Up Plan: SW will follow up with patient by phone over the next 7-14 business days       [place], [place] Worker  [place] Management [phone]

## 2021-07-06 ENCOUNTER — Other Ambulatory Visit: Payer: Self-pay | Admitting: Family Medicine

## 2021-07-06 DIAGNOSIS — K219 Gastro-esophageal reflux disease without esophagitis: Secondary | ICD-10-CM

## 2021-07-11 ENCOUNTER — Ambulatory Visit (INDEPENDENT_AMBULATORY_CARE_PROVIDER_SITE_OTHER): Payer: Medicare Other | Admitting: *Deleted

## 2021-07-11 DIAGNOSIS — E113299 Type 2 diabetes mellitus with mild nonproliferative diabetic retinopathy without macular edema, unspecified eye: Secondary | ICD-10-CM

## 2021-07-11 DIAGNOSIS — I509 Heart failure, unspecified: Secondary | ICD-10-CM

## 2021-07-11 DIAGNOSIS — I10 Essential (primary) hypertension: Secondary | ICD-10-CM

## 2021-07-11 NOTE — Chronic Care Management (AMB) (Signed)
Chronic Care Management    Clinical Social Work Note  07/11/2021 Name: Kristen Schroeder MRN: [id number] DOB: June 15, 1941  Kristen Schroeder is a 80 y.o. year old female who is a primary care patient of Maple Hudson., MD. The CCM team was consulted to assist the patient with chronic disease management and/or care coordination needs related to: [employer] .   Collaboration with CAP-[facility]  for  referral in information   in response to provider referral for social work chronic care management and care coordination services.   Consent to Services:  The patient was given information about Chronic Care Management services, agreed to services, and gave verbal consent prior to initiation of services.  Please see initial visit note for detailed documentation.   Patient agreed to services and consent obtained.   Assessment: Review of patient past medical history, allergies, medications, and health status, including review of relevant consultants reports was performed today as part of a comprehensive evaluation and provision of chronic care management and care coordination services.     SDOH (Social Determinants of Health) assessments and interventions performed:    Advanced Directives Status: Not addressed in this encounter.  CCM Care Plan  Allergies  Allergen Reactions   Codeine Nausea And Vomiting and Nausea Only    Outpatient Encounter Medications as of 07/11/2021  Medication Sig Note   acetaminophen (TYLENOL) 500 MG tablet Take 500 mg by mouth every 6 (six) hours as needed.    aspirin EC 81 MG tablet Take 81 mg by mouth daily.    cetirizine (ZYRTEC) 10 MG tablet TAKE 1 TABLET BY MOUTH EVERY DAY    enalapril (VASOTEC) 20 MG tablet TAKE 1 TABLET BY MOUTH EVERY DAY    furosemide (LASIX) 20 MG tablet TAKE 2 TABLETS (40MG  TOTAL) BY MOUTH EVERY DAY    gabapentin (NEURONTIN) 300 MG capsule TAKE 2 CAPSULES IN THE MORNING, 1 CAPSULE IN THE AFTERNOON AND 2 CAPSULES  IN THE EVENING    ipratropium-albuterol (DUONEB) 0.5-2.5 (3) MG/3ML SOLN INHALE 1 VIAL VIA NEBULIZER BY MOUTH 3 TIMES A DAY AS NEEDED FOR COPD/ASTHMA    isosorbide mononitrate (IMDUR) 30 MG 24 hr tablet Take 1 tablet (30 mg total) by mouth daily as needed (high bp).    levothyroxine (SYNTHROID) 125 MCG tablet TAKE 1 TABLET BY MOUTH EVERY DAY    meclizine (ANTIVERT) 25 MG tablet Take 1 tablet (25 mg total) by mouth 3 (three) times daily as needed for dizziness.    MULTIPLE VITAMIN PO Take 1 tablet by mouth daily.     nirmatrelvir/ritonavir EUA (PAXLOVID) TABS (Take nirmatrelvir 150 mg two tablets twice daily for 5 days and ritonavir 100 mg one tablet twice daily for 5 days) Patient GFR is 87. (Patient not taking: Reported on 06/27/2021)    omeprazole (PRILOSEC) 40 MG capsule TAKE 1 CAPSULE BY MOUTH EVERY DAY    ondansetron (ZOFRAN-ODT) 8 MG disintegrating tablet Take by mouth.    OXYGEN Inhale 4 L into the lungs. 10/15/2018: Patient uses 3 to 3 1/2 liters continuously   phenylephrine (NEO-SYNEPHRINE) 1 % nasal spray Place 1 drop into both nostrils every 6 (six) hours as needed for congestion.    sertraline (ZOLOFT) 50 MG tablet Take 1 tablet (50 mg total) by mouth daily.    No facility-administered encounter medications on file as of 07/11/2021.    Patient Active Problem List   Diagnosis Date Noted   Depression, major, single episode, complete remission ([facility]) 05/20/2020  Incarcerated hernia of abdominal cavity 08/04/2018   SBO (small bowel obstruction) (H[facility]) 04/22/2018   Incarcerated hernia 04/14/2018   Aneurysm ([facility]) 07/10/2016   Nonspecific abnormal finding 05/11/2015   Allergic rhinitis 05/11/2015   Anxiety 05/11/2015   Bronchitis, chronic (HCC) 05/11/2015   CCF (congestive cardiac failure) (HCC) 05/11/2015   Clinical depression 05/11/2015   DDD (degenerative disc disease), 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/11/2015   Essential (primary) hypertension 05/11/2015   Acid reflux 05/11/2015   HLD  (hyperlipidemia) 05/11/2015   Adult hypothyroidism 05/11/2015   Cervical dysplasia, mild 05/11/2015   Neuropathy 05/11/2015   Adiposity 05/11/2015   Obstructive apnea 05/11/2015   Arthritis, degenerative 05/11/2015    Conditions to be addressed/monitored: HTN, COPD, and DMII; Limited social support and ADL IADL limitations  Care Plan : General Pharmacy (Adult)  Updates made by Wenda Overland, LCSW since 07/11/2021 12:00 AM     Problem: CHL AMB "PATIENT-SPECIFIC PROBLEM"   Priority: Medium  Note:   CARE PLAN ENTRY (see longitudinal plan of care for additional care plan information)  Current Barriers:  Patient with HTN, COPD, and DM in need of assistance with connection to community resources  Knowledge deficits and need for support, education and care coordination related to community resources support  Limited social support  Clinical Goal(s)  Over the next 90 days, patient will work with care management team member to address concerns related to community resources for in home care   Interventions provided by LCSW:  Assessed patient's care coordination needs related to in home care needs and discussed ongoing care management follow up  SDOH completed identifying need for no cost/low cost in home care Patient discussed having some physical limitations(not able to stand for long periods of time, back pain) and is in need of in home help Patient confirmed limited social support as her husband is now hospitalized, patient's daughter resides in [place],  is available during the week to assist with care but additional assistance is needed in her absence Provided patient with information about CAP services through the Department of Social Services as well as the Case Management Program through [facility]. Private pay options for in home care also discussed. Explored patient's Medicaid eligibility-does not qualify due to income Collaborated with appropriate community resources  to possibly assist-Referral made to [employer] for their in home care/case management program(wait list) Confirmed with the Department of Social Services-patient needs to be a Medicaid recipient to qualify for CAP services Active listening / Reflection utilized   Emotional Support  and reassurance provided  07/11/21-update Confirmed that patient resides in [facility] and is not eligible for in home care program through [facility]-Phone call to the [facility] CAP services offices, CAP application emailed to this worker for [employer] confirmed to be approximately 4 months, co-pay may be required for non Medicaid recipients  Patient Self Care Activities & Deficits:  Patient is unable to independently navigate community resource options without care coordination support  Acknowledges deficits and is motivated to resolve concern  Limited social support Self administers medications as prescribed Attends all scheduled provider appointments  Initial goal documentation        Follow Up Plan: SW will follow up with patient by phone over the next 7-14 business days       [place], [place] Clinical Social Worker  [facility] Care Management [phone]

## 2021-07-12 ENCOUNTER — Ambulatory Visit: Payer: Medicare Other | Admitting: *Deleted

## 2021-07-12 DIAGNOSIS — I509 Heart failure, unspecified: Secondary | ICD-10-CM

## 2021-07-12 DIAGNOSIS — I10 Essential (primary) hypertension: Secondary | ICD-10-CM

## 2021-07-12 DIAGNOSIS — E113299 Type 2 diabetes mellitus with mild nonproliferative diabetic retinopathy without macular edema, unspecified eye: Secondary | ICD-10-CM

## 2021-07-13 NOTE — Chronic Care Management (AMB) (Signed)
Chronic Care Management    Clinical Social Work Note  07/13/2021 Name: Kristen Schroeder MRN: [id number] DOB: 1940/11/16  Kristen Schroeder is a 80 y.o. year old female who is a primary care patient of Maple Hudson., MD. The CCM team was consulted to assist the patient with chronic disease management and/or care coordination needs related to: [employer] .   Engaged with patient by telephone for follow up visit in response to provider referral for social work chronic care management and care coordination services.   Consent to Services:  The patient was given information about Chronic Care Management services, agreed to services, and gave verbal consent prior to initiation of services.  Please see initial visit note for detailed documentation.   Patient agreed to services and consent obtained.   Assessment: Review of patient past medical history, allergies, medications, and health status, including review of relevant consultants reports was performed today as part of a comprehensive evaluation and provision of chronic care management and care coordination services.     SDOH (Social Determinants of Health) assessments and interventions performed:    Advanced Directives Status: Not addressed in this encounter.  CCM Care Plan  Allergies  Allergen Reactions   Codeine Nausea And Vomiting and Nausea Only    Outpatient Encounter Medications as of 07/12/2021  Medication Sig Note   acetaminophen (TYLENOL) 500 MG tablet Take 500 mg by mouth every 6 (six) hours as needed.    aspirin EC 81 MG tablet Take 81 mg by mouth daily.    cetirizine (ZYRTEC) 10 MG tablet TAKE 1 TABLET BY MOUTH EVERY DAY    enalapril (VASOTEC) 20 MG tablet TAKE 1 TABLET BY MOUTH EVERY DAY    furosemide (LASIX) 20 MG tablet TAKE 2 TABLETS (40MG  TOTAL) BY MOUTH EVERY DAY    gabapentin (NEURONTIN) 300 MG capsule TAKE 2 CAPSULES IN THE MORNING, 1 CAPSULE IN THE AFTERNOON AND 2 CAPSULES IN THE EVENING     ipratropium-albuterol (DUONEB) 0.5-2.5 (3) MG/3ML SOLN INHALE 1 VIAL VIA NEBULIZER BY MOUTH 3 TIMES A DAY AS NEEDED FOR COPD/ASTHMA    isosorbide mononitrate (IMDUR) 30 MG 24 hr tablet Take 1 tablet (30 mg total) by mouth daily as needed (high bp).    levothyroxine (SYNTHROID) 125 MCG tablet TAKE 1 TABLET BY MOUTH EVERY DAY    meclizine (ANTIVERT) 25 MG tablet Take 1 tablet (25 mg total) by mouth 3 (three) times daily as needed for dizziness.    MULTIPLE VITAMIN PO Take 1 tablet by mouth daily.     nirmatrelvir/ritonavir EUA (PAXLOVID) TABS (Take nirmatrelvir 150 mg two tablets twice daily for 5 days and ritonavir 100 mg one tablet twice daily for 5 days) Patient GFR is 87. (Patient not taking: Reported on 06/27/2021)    omeprazole (PRILOSEC) 40 MG capsule TAKE 1 CAPSULE BY MOUTH EVERY DAY    ondansetron (ZOFRAN-ODT) 8 MG disintegrating tablet Take by mouth.    OXYGEN Inhale 4 L into the lungs. 10/15/2018: Patient uses 3 to 3 1/2 liters continuously   phenylephrine (NEO-SYNEPHRINE) 1 % nasal spray Place 1 drop into both nostrils every 6 (six) hours as needed for congestion.    sertraline (ZOLOFT) 50 MG tablet Take 1 tablet (50 mg total) by mouth daily.    No facility-administered encounter medications on file as of 07/12/2021.    Patient Active Problem List   Diagnosis Date Noted   Depression, major, single episode, complete remission (HCC) 05/20/2020   Incarcerated hernia of abdominal  cavity 08/04/2018   SBO (small bowel obstruction) (HCC) 04/22/2018   Incarcerated hernia 04/14/2018   Aneurysm (HCC) 07/10/2016   Nonspecific abnormal finding 05/11/2015   Allergic rhinitis 05/11/2015   Anxiety 05/11/2015   Bronchitis, chronic (HCC) 05/11/2015   CCF (congestive cardiac failure) (HCC) 05/11/2015   Clinical depression 05/11/2015   DDD (degenerative disc disease), 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/11/2015   Essential (primary) hypertension 05/11/2015   Acid reflux 05/11/2015   HLD (hyperlipidemia) 05/11/2015    Adult hypothyroidism 05/11/2015   Cervical dysplasia, mild 05/11/2015   Neuropathy 05/11/2015   Adiposity 05/11/2015   Obstructive apnea 05/11/2015   Arthritis, degenerative 05/11/2015    Conditions to be addressed/monitored:HTN, COPD, and DMII ; Limited social support ADL,IADL limitations  Care Plan : General Pharmacy (Adult)  Updates made by Wenda Overland, LCSW since 07/13/2021 12:00 AM     Problem: CHL AMB "PATIENT-SPECIFIC PROBLEM"   Priority: Medium  Note:   CARE PLAN ENTRY (see longitudinal plan of care for additional care plan information)  Current Barriers:  Patient with HTN, COPD, and DM in need of assistance with connection to community resources  Knowledge deficits and need for support, education and care coordination related to community resources support  Limited social support  Clinical Goal(s)  Over the next 90 days, patient will work with care management team member to address concerns related to community resources for in home care   Interventions provided by LCSW:  Continued to assess patient's care coordination needs related to in home care needs and discussed ongoing care management follow up  Patient and patient's daughter continue to reports limited social support and are in need for resources Referral to the [facility] [employer] discussed-patient and daughter agreeable. Referral completed and securely emailed to the [facility] Alternatives Program office for review Collaboration phone call to [employer] [place] to explore in home resources-no available no cost resources available, however contact information for [place] home health [phone] Active listening / Reflection utilized   Emotional Support  and reassurance provided   Patient Self Care Activities & Deficits:  Patient is unable to independently navigate community resource options without care coordination support  Acknowledges deficits and is  motivated to resolve concern  Limited social support Self administers medications as prescribed Attends all scheduled provider appointments  Please see past updates related to this goal by clicking on the "Past Updates" button in the selected goal         Follow Up Plan: SW will follow up with patient by phone over the next 14 business days       [employer], [place] Clinical Social Worker  [facility] Care Management [phone]

## 2021-07-13 NOTE — Patient Instructions (Signed)
Visit Information   Goals Addressed             This Visit's Progress    Find Help in My Community       Timeframe:  Long-Range Goal Priority:  Medium Start Date:   07/05/21                          Expected End Date:    12/2820                Follow Up Date 07/25/21    - begin a notebook of services in my neighborhood or community - follow-up on any referrals for help I am given - think ahead to make sure my need does not become an emergency - have a back-up plan - make a list of family or friends that I can call  -follow up with CAP(Community Alternatives Referral)   Why is this important?   Knowing how and where to find help for yourself or family in your neighborhood and community is an important skill.  You will want to take some steps to learn how.    Notes:         The patient verbalized understanding of instructions, educational materials, and care plan provided today and declined offer to receive copy of patient instructions, educational materials, and care plan.   Telephone follow up appointment with care management team member scheduled for:07/25/21    Verna Czech, LCSW Clinical Social Worker  [facility] Care Management [phone]

## 2021-07-15 DIAGNOSIS — J449 Chronic obstructive pulmonary disease, unspecified: Secondary | ICD-10-CM | POA: Diagnosis not present

## 2021-07-16 DIAGNOSIS — J449 Chronic obstructive pulmonary disease, unspecified: Secondary | ICD-10-CM | POA: Diagnosis not present

## 2021-07-19 ENCOUNTER — Telehealth: Payer: Self-pay

## 2021-07-19 ENCOUNTER — Other Ambulatory Visit: Payer: Self-pay | Admitting: Family Medicine

## 2021-07-19 DIAGNOSIS — R059 Cough, unspecified: Secondary | ICD-10-CM

## 2021-07-19 NOTE — Telephone Encounter (Signed)
Requested medication (s) are due for refill today- no  Requested medication (s) are on the active medication list -no  Future visit scheduled -yes  Last refill: 04/12/21  Notes to clinic: Request RF: medication not assigned to protocol  Requested Prescriptions  Pending Prescriptions Disp Refills   azithromycin (ZITHROMAX) 250 MG tablet [Pharmacy Med Name: AZITHROMYCIN 250 MG TABLET] 6 tablet 1    Sig: TAKE 2 TABLETS BY MOUTH TODAY, THEN TAKE 1 TABLET DAILY FOR 4 DAYS     Off-Protocol Failed - 07/19/2021 11:41 AM      Failed - Medication not assigned to a protocol, review manually.      Passed - Valid encounter within last 12 months    Recent Outpatient Visits           3 weeks ago Congestive heart failure, unspecified HF chronicity, unspecified heart failure type [facility])   [facility] Maple Hudson., MD   4 months ago Congestive heart failure, unspecified HF chronicity, unspecified heart failure type [facility])   [facility] Maple Hudson., MD   6 months ago Chronic respiratory failure with hypoxia and hypercapnia [facility])   [facility] Maple Hudson., MD   7 months ago Allergic rhinitis, unspecified seasonality, unspecified trigger   [facility] Maple Hudson., MD   8 months ago Pulmonary fibrosis [facility])   [facility] Maple Hudson., MD       Future Appointments             In 3 months Maple Hudson., MD [facility]               Requested Prescriptions  Pending Prescriptions Disp Refills   azithromycin (ZITHROMAX) 250 MG tablet [Pharmacy Med Name: AZITHROMYCIN 250 MG TABLET] 6 tablet 1    Sig: TAKE 2 TABLETS BY MOUTH TODAY, THEN TAKE 1 TABLET DAILY FOR 4 DAYS     Off-Protocol Failed - 07/19/2021 11:41 AM      Failed - Medication not assigned to a protocol, review manually.      Passed - Valid encounter within last 12 months     Recent Outpatient Visits           3 weeks ago Congestive heart failure, unspecified HF chronicity, unspecified heart failure type [facility])   [facility] Maple Hudson., MD   4 months ago Congestive heart failure, unspecified HF chronicity, unspecified heart failure type [facility])   [facility] Maple Hudson., MD   6 months ago Chronic respiratory failure with hypoxia and hypercapnia [facility])   [facility] Maple Hudson., MD   7 months ago Allergic rhinitis, unspecified seasonality, unspecified trigger   [facility] Maple Hudson., MD   8 months ago Pulmonary fibrosis [facility])   [facility] Maple Hudson., MD       Future Appointments             In 3 months Maple Hudson., MD [facility]

## 2021-07-19 NOTE — Telephone Encounter (Signed)
Copied from CRM [id number]. Topic: General - Other >> Jul 19, 2021 11:21 AM Traci Sermon wrote: Reason for CRM: Pt called in stating she wanted to speak with PCP Nurse, pt did not specify why, but it does sound like she is currently in the hospital. Please advise.

## 2021-07-20 NOTE — Telephone Encounter (Signed)
Patient was advised.  

## 2021-07-20 NOTE — Telephone Encounter (Signed)
Patient reports that for the last 2 days, she has had cough and sneezing and PND. She was wanting to know what can she take for this? Please advise. Thanks!

## 2021-07-20 NOTE — Telephone Encounter (Signed)
Zyrtec and Robitussin to start.

## 2021-07-25 ENCOUNTER — Ambulatory Visit: Payer: Medicare Other | Admitting: *Deleted

## 2021-07-25 DIAGNOSIS — E113299 Type 2 diabetes mellitus with mild nonproliferative diabetic retinopathy without macular edema, unspecified eye: Secondary | ICD-10-CM

## 2021-07-25 DIAGNOSIS — I10 Essential (primary) hypertension: Secondary | ICD-10-CM

## 2021-07-25 DIAGNOSIS — I509 Heart failure, unspecified: Secondary | ICD-10-CM

## 2021-07-25 NOTE — Chronic Care Management (AMB) (Signed)
Chronic Care Management    Clinical Social Work Note  07/25/2021 Name: Kristen Schroeder MRN: [id number] DOB: 1941/06/06  Kristen Schroeder is a 80 y.o. year old female who is a primary care patient of Maple Hudson., MD. The CCM team was consulted to assist the patient with chronic disease management and/or care coordination needs related to: [employer] .   Engaged with patient by telephone for follow up visit in response to provider referral for social work chronic care management and care coordination services.   Consent to Services:  The patient was given information about Chronic Care Management services, agreed to services, and gave verbal consent prior to initiation of services.  Please see initial visit note for detailed documentation.   Patient agreed to services and consent obtained.   Assessment: Review of patient past medical history, allergies, medications, and health status, including review of relevant consultants reports was performed today as part of a comprehensive evaluation and provision of chronic care management and care coordination services.     SDOH (Social Determinants of Health) assessments and interventions performed:    Advanced Directives Status: Not addressed in this encounter.  CCM Care Plan  Allergies  Allergen Reactions   Codeine Nausea And Vomiting and Nausea Only    Outpatient Encounter Medications as of 07/25/2021  Medication Sig Note   acetaminophen (TYLENOL) 500 MG tablet Take 500 mg by mouth every 6 (six) hours as needed.    aspirin EC 81 MG tablet Take 81 mg by mouth daily.    cetirizine (ZYRTEC) 10 MG tablet TAKE 1 TABLET BY MOUTH EVERY DAY    enalapril (VASOTEC) 20 MG tablet TAKE 1 TABLET BY MOUTH EVERY DAY    furosemide (LASIX) 20 MG tablet TAKE 2 TABLETS (40MG  TOTAL) BY MOUTH EVERY DAY    gabapentin (NEURONTIN) 300 MG capsule TAKE 2 CAPSULES IN THE MORNING, 1 CAPSULE IN THE AFTERNOON AND 2 CAPSULES IN THE EVENING     ipratropium-albuterol (DUONEB) 0.5-2.5 (3) MG/3ML SOLN INHALE 1 VIAL VIA NEBULIZER BY MOUTH 3 TIMES A DAY AS NEEDED FOR COPD/ASTHMA    isosorbide mononitrate (IMDUR) 30 MG 24 hr tablet Take 1 tablet (30 mg total) by mouth daily as needed (high bp).    levothyroxine (SYNTHROID) 125 MCG tablet TAKE 1 TABLET BY MOUTH EVERY DAY    meclizine (ANTIVERT) 25 MG tablet Take 1 tablet (25 mg total) by mouth 3 (three) times daily as needed for dizziness.    MULTIPLE VITAMIN PO Take 1 tablet by mouth daily.     nirmatrelvir/ritonavir EUA (PAXLOVID) TABS (Take nirmatrelvir 150 mg two tablets twice daily for 5 days and ritonavir 100 mg one tablet twice daily for 5 days) Patient GFR is 87. (Patient not taking: Reported on 06/27/2021)    omeprazole (PRILOSEC) 40 MG capsule TAKE 1 CAPSULE BY MOUTH EVERY DAY    ondansetron (ZOFRAN-ODT) 8 MG disintegrating tablet Take by mouth.    OXYGEN Inhale 4 L into the lungs. 10/15/2018: Patient uses 3 to 3 1/2 liters continuously   phenylephrine (NEO-SYNEPHRINE) 1 % nasal spray Place 1 drop into both nostrils every 6 (six) hours as needed for congestion.    sertraline (ZOLOFT) 50 MG tablet Take 1 tablet (50 mg total) by mouth daily.    No facility-administered encounter medications on file as of 07/25/2021.    Patient Active Problem List   Diagnosis Date Noted   Depression, major, single episode, complete remission (HCC) 05/20/2020   Incarcerated hernia of abdominal  cavity 08/04/2018   SBO (small bowel obstruction) (HCC) 04/22/2018   Incarcerated hernia 04/14/2018   Aneurysm (HCC) 07/10/2016   Nonspecific abnormal finding 05/11/2015   Allergic rhinitis 05/11/2015   Anxiety 05/11/2015   Bronchitis, chronic (HCC) 05/11/2015   CCF (congestive cardiac failure) (HCC) 05/11/2015   Clinical depression 05/11/2015   DDD (degenerative disc disease), 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/11/2015   Essential (primary) hypertension 05/11/2015   Acid reflux 05/11/2015   HLD (hyperlipidemia) 05/11/2015    Adult hypothyroidism 05/11/2015   Cervical dysplasia, mild 05/11/2015   Neuropathy 05/11/2015   Adiposity 05/11/2015   Obstructive apnea 05/11/2015   Arthritis, degenerative 05/11/2015    Conditions to be addressed/monitored:  HTN, COPD, and DMII ; Limited social support ADL,IADL limitations  Care Plan : General Pharmacy (Adult)  Updates made by Wenda Overland, LCSW since 07/25/2021 12:00 AM     Problem: CHL AMB "PATIENT-SPECIFIC PROBLEM"   Priority: Medium  Note:   CARE PLAN ENTRY (see longitudinal plan of care for additional care plan information)  Current Barriers:  Patient with HTN, COPD, and DM in need of assistance with connection to community resources  Knowledge deficits and need for support, education and care coordination related to community resources support  Limited social support  Clinical Goal(s)  Over the next 90 days, patient will work with care management team member to address concerns related to community resources for in home care   Interventions provided by LCSW:  Continued to assess patient's care coordination needs related to in home care needs and discussed ongoing care management follow up  Patient continues to report limited social support and are in need for resources Referral to the [facility] [employer] discussed-patient and daughter agreeable. Referral completed and securely emailed to the [facility] Alternatives Program office for review however it has now been confirmed that this program is a Medicaid funded program, patient does not have Medicaid-confirmed that she has applied and does not qualify Previous collaboration phone call to [employer] [place] to explore in home resources-no available no cost resources available, however contact information for [place] home health [phone]-patient continues to confirm inability to afford the out of pocket expense Active listening / Reflection utilized    Industrial/product designer  and reassurance provided  Alternative options for in home care continue to be explored  Patient Self Care Activities & Deficits:  Patient is unable to independently navigate community resource options without care coordination support  Acknowledges deficits and is motivated to resolve concern  Limited social support Self administers medications as prescribed Attends all scheduled provider appointments  Please see past updates related to this goal by clicking on the "Past Updates" button in the selected goal         Follow Up Plan: SW will follow up with patient by phone over the next 14 business days      [employer], [place] Clinical Social Worker  [facility] Care Management [phone]

## 2021-08-02 ENCOUNTER — Ambulatory Visit: Payer: Self-pay | Admitting: *Deleted

## 2021-08-02 DIAGNOSIS — I509 Heart failure, unspecified: Secondary | ICD-10-CM

## 2021-08-02 DIAGNOSIS — E113299 Type 2 diabetes mellitus with mild nonproliferative diabetic retinopathy without macular edema, unspecified eye: Secondary | ICD-10-CM

## 2021-08-02 DIAGNOSIS — I10 Essential (primary) hypertension: Secondary | ICD-10-CM

## 2021-08-02 DIAGNOSIS — J449 Chronic obstructive pulmonary disease, unspecified: Secondary | ICD-10-CM | POA: Diagnosis not present

## 2021-08-02 NOTE — Chronic Care Management (AMB) (Signed)
Chronic Care Management    Clinical Social Work Note  08/02/2021 Name: Kristen Schroeder MRN: [id number] DOB: 11-Dec-1940  Kristen Schroeder is a 80 y.o. year old female who is a primary care patient of Maple Hudson., MD. The CCM team was consulted to assist the patient with chronic disease management and/or care coordination needs related to: Level of Care Concerns.   Collaboration with patient's daughter regarding in home care/CAPS services  in response to provider referral for social work chronic care management and care coordination services.   Consent to Services:  The patient was given information about Chronic Care Management services, agreed to services, and gave verbal consent prior to initiation of services.  Please see initial visit note for detailed documentation.   Patient agreed to services and consent obtained.   Assessment: Review of patient past medical history, allergies, medications, and health status, including review of relevant consultants reports was performed today as part of a comprehensive evaluation and provision of chronic care management and care coordination services.     SDOH (Social Determinants of Health) assessments and interventions performed:    Advanced Directives Status: Not addressed in this encounter.  CCM Care Plan  Allergies  Allergen Reactions   Codeine Nausea And Vomiting and Nausea Only    Outpatient Encounter Medications as of 08/02/2021  Medication Sig Note   acetaminophen (TYLENOL) 500 MG tablet Take 500 mg by mouth every 6 (six) hours as needed.    aspirin EC 81 MG tablet Take 81 mg by mouth daily.    cetirizine (ZYRTEC) 10 MG tablet TAKE 1 TABLET BY MOUTH EVERY DAY    enalapril (VASOTEC) 20 MG tablet TAKE 1 TABLET BY MOUTH EVERY DAY    furosemide (LASIX) 20 MG tablet TAKE 2 TABLETS (40MG  TOTAL) BY MOUTH EVERY DAY    gabapentin (NEURONTIN) 300 MG capsule TAKE 2 CAPSULES IN THE MORNING, 1 CAPSULE IN THE AFTERNOON AND 2 CAPSULES IN  THE EVENING    ipratropium-albuterol (DUONEB) 0.5-2.5 (3) MG/3ML SOLN INHALE 1 VIAL VIA NEBULIZER BY MOUTH 3 TIMES A DAY AS NEEDED FOR COPD/ASTHMA    isosorbide mononitrate (IMDUR) 30 MG 24 hr tablet Take 1 tablet (30 mg total) by mouth daily as needed (high bp).    levothyroxine (SYNTHROID) 125 MCG tablet TAKE 1 TABLET BY MOUTH EVERY DAY    meclizine (ANTIVERT) 25 MG tablet Take 1 tablet (25 mg total) by mouth 3 (three) times daily as needed for dizziness.    MULTIPLE VITAMIN PO Take 1 tablet by mouth daily.     nirmatrelvir/ritonavir EUA (PAXLOVID) TABS (Take nirmatrelvir 150 mg two tablets twice daily for 5 days and ritonavir 100 mg one tablet twice daily for 5 days) Patient GFR is 87. (Patient not taking: Reported on 06/27/2021)    omeprazole (PRILOSEC) 40 MG capsule TAKE 1 CAPSULE BY MOUTH EVERY DAY    ondansetron (ZOFRAN-ODT) 8 MG disintegrating tablet Take by mouth.    OXYGEN Inhale 4 L into the lungs. 10/15/2018: Patient uses 3 to 3 1/2 liters continuously   phenylephrine (NEO-SYNEPHRINE) 1 % nasal spray Place 1 drop into both nostrils every 6 (six) hours as needed for congestion.    sertraline (ZOLOFT) 50 MG tablet Take 1 tablet (50 mg total) by mouth daily.    No facility-administered encounter medications on file as of 08/02/2021.    Patient Active Problem List   Diagnosis Date Noted   Depression, major, single episode, complete remission (HCC) 05/20/2020   Incarcerated hernia  of abdominal cavity 08/04/2018   SBO (small bowel obstruction) (HCC) 04/22/2018   Incarcerated hernia 04/14/2018   Aneurysm (HCC) 07/10/2016   Nonspecific abnormal finding 05/11/2015   Allergic rhinitis 05/11/2015   Anxiety 05/11/2015   Bronchitis, chronic (HCC) 05/11/2015   CCF (congestive cardiac failure) (HCC) 05/11/2015   Clinical depression 05/11/2015   DDD (degenerative disc disease), 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/11/2015   Essential (primary) hypertension 05/11/2015   Acid reflux 05/11/2015   HLD  (hyperlipidemia) 05/11/2015   Adult hypothyroidism 05/11/2015   Cervical dysplasia, mild 05/11/2015   Neuropathy 05/11/2015   Adiposity 05/11/2015   Obstructive apnea 05/11/2015   Arthritis, degenerative 05/11/2015    Conditions to be addressed/monitored:  HTN, COPD, and DMII ; Limited social support ADL,IADL limitations Care Plan : [facility] (Adult)  Updates made by Wenda Overland, LCSW since 08/02/2021 12:00 AM     Problem: CHL AMB "PATIENT-SPECIFIC PROBLEM"   Priority: Medium  Note:   CARE PLAN ENTRY (see longitudinal plan of care for additional care plan information)  Current Barriers:  Patient with HTN, COPD, and DM in need of assistance with connection to community resources  Knowledge deficits and need for support, education and care coordination related to community resources support  Limited social support  Clinical Goal(s)  Over the next 90 days, patient will work with care management team member to address concerns related to community resources for in home care   Interventions provided by LCSW:  Continued to assess patient's care coordination needs related to in home care needs and discussed ongoing care management follow up  Patient continues to report limited social support and are in need for resources Referral to the [facility] [employer] discussed-patient and daughter agreeable. Referral completed and securely emailed to the [facility] Alternatives Program office for review however it has now been confirmed that this program is a Medicaid funded program, patient does not have Medicaid-confirmed that she has applied and does not qualify Previous collaboration phone call to [employer] [facility] [place] to explore in home resources-no available no cost resources available, however contact information for [facility] [phone]-patient continues to confirm inability to afford the out of pocket expense Active listening  / Reflection utilized   Emotional Support  and reassurance provided  Alternative options for in home care continue to be explored Update 08/02/21 care coordination with patient's provider to complete referral form for CAP/DA services through the Department of Social Services-completed form received and faxed to Department of Health and Health and safety inspector for processing  Patient Self Care Activities & Deficits:  Patient is unable to independently navigate community resource options without care coordination support  Acknowledges deficits and is motivated to resolve concern  Limited social support Self administers medications as prescribed Attends all scheduled provider appointments  Please see past updates related to this goal by clicking on the "Past Updates" button in the selected goal         Follow Up Plan: SW will follow up with patient by phone over the next 7-14 business days       [place], [place] Clinical Social Worker  [facility] Care Management [phone]

## 2021-08-04 DIAGNOSIS — I509 Heart failure, unspecified: Secondary | ICD-10-CM | POA: Diagnosis not present

## 2021-08-04 DIAGNOSIS — E113299 Type 2 diabetes mellitus with mild nonproliferative diabetic retinopathy without macular edema, unspecified eye: Secondary | ICD-10-CM | POA: Diagnosis not present

## 2021-08-04 DIAGNOSIS — I10 Essential (primary) hypertension: Secondary | ICD-10-CM | POA: Diagnosis not present

## 2021-08-07 ENCOUNTER — Telehealth: Payer: Self-pay

## 2021-08-08 ENCOUNTER — Ambulatory Visit (INDEPENDENT_AMBULATORY_CARE_PROVIDER_SITE_OTHER): Payer: Medicare Other | Admitting: *Deleted

## 2021-08-08 DIAGNOSIS — I509 Heart failure, unspecified: Secondary | ICD-10-CM

## 2021-08-08 DIAGNOSIS — I10 Essential (primary) hypertension: Secondary | ICD-10-CM

## 2021-08-08 DIAGNOSIS — E113299 Type 2 diabetes mellitus with mild nonproliferative diabetic retinopathy without macular edema, unspecified eye: Secondary | ICD-10-CM

## 2021-08-08 NOTE — Chronic Care Management (AMB) (Signed)
Chronic Care Management    Clinical Social Work Note  08/08/2021 Name: Kristen Schroeder MRN: [id number] DOB: January 02, 1941  Kristen Schroeder is a 80 y.o. year old female who is a primary care patient of Maple Hudson., MD. The CCM team was consulted to assist the patient with chronic disease management and/or care coordination needs related to: [employer] .   Engaged with patient and patient's daughter by telephone for follow up visit in response to provider referral for social work chronic care management and care coordination services.   Consent to Services:  The patient was given information about Chronic Care Management services, agreed to services, and gave verbal consent prior to initiation of services.  Please see initial visit note for detailed documentation.   Patient agreed to services and consent obtained.   Assessment: Review of patient past medical history, allergies, medications, and health status, including review of relevant consultants reports was performed today as part of a comprehensive evaluation and provision of chronic care management and care coordination services.     SDOH (Social Determinants of Health) assessments and interventions performed:    Advanced Directives Status: Not addressed in this encounter.  CCM Care Plan  Allergies  Allergen Reactions   Codeine Nausea And Vomiting and Nausea Only    Outpatient Encounter Medications as of 08/08/2021  Medication Sig Note   acetaminophen (TYLENOL) 500 MG tablet Take 500 mg by mouth every 6 (six) hours as needed.    aspirin EC 81 MG tablet Take 81 mg by mouth daily.    cetirizine (ZYRTEC) 10 MG tablet TAKE 1 TABLET BY MOUTH EVERY DAY    enalapril (VASOTEC) 20 MG tablet TAKE 1 TABLET BY MOUTH EVERY DAY    furosemide (LASIX) 20 MG tablet TAKE 2 TABLETS (40MG  TOTAL) BY MOUTH EVERY DAY    gabapentin (NEURONTIN) 300 MG capsule TAKE 2 CAPSULES IN THE MORNING, 1 CAPSULE IN THE AFTERNOON AND 2 CAPSULES IN THE  EVENING    ipratropium-albuterol (DUONEB) 0.5-2.5 (3) MG/3ML SOLN INHALE 1 VIAL VIA NEBULIZER BY MOUTH 3 TIMES A DAY AS NEEDED FOR COPD/ASTHMA    isosorbide mononitrate (IMDUR) 30 MG 24 hr tablet Take 1 tablet (30 mg total) by mouth daily as needed (high bp).    levothyroxine (SYNTHROID) 125 MCG tablet TAKE 1 TABLET BY MOUTH EVERY DAY    meclizine (ANTIVERT) 25 MG tablet Take 1 tablet (25 mg total) by mouth 3 (three) times daily as needed for dizziness.    MULTIPLE VITAMIN PO Take 1 tablet by mouth daily.     nirmatrelvir/ritonavir EUA (PAXLOVID) TABS (Take nirmatrelvir 150 mg two tablets twice daily for 5 days and ritonavir 100 mg one tablet twice daily for 5 days) Patient GFR is 87. (Patient not taking: Reported on 06/27/2021)    omeprazole (PRILOSEC) 40 MG capsule TAKE 1 CAPSULE BY MOUTH EVERY DAY    ondansetron (ZOFRAN-ODT) 8 MG disintegrating tablet Take by mouth.    OXYGEN Inhale 4 L into the lungs. 10/15/2018: Patient uses 3 to 3 1/2 liters continuously   phenylephrine (NEO-SYNEPHRINE) 1 % nasal spray Place 1 drop into both nostrils every 6 (six) hours as needed for congestion.    sertraline (ZOLOFT) 50 MG tablet Take 1 tablet (50 mg total) by mouth daily.    No facility-administered encounter medications on file as of 08/08/2021.    Patient Active Problem List   Diagnosis Date Noted   Depression, major, single episode, complete remission (HCC) 05/20/2020   Incarcerated  hernia of abdominal cavity 08/04/2018   SBO (small bowel obstruction) (HCC) 04/22/2018   Incarcerated hernia 04/14/2018   Aneurysm (HCC) 07/10/2016   Nonspecific abnormal finding 05/11/2015   Allergic rhinitis 05/11/2015   Anxiety 05/11/2015   Bronchitis, chronic (HCC) 05/11/2015   CCF (congestive cardiac failure) (HCC) 05/11/2015   Clinical depression 05/11/2015   DDD (degenerative disc disease), 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/11/2015   Essential (primary) hypertension 05/11/2015   Acid reflux 05/11/2015   HLD (hyperlipidemia)  05/11/2015   Adult hypothyroidism 05/11/2015   Cervical dysplasia, mild 05/11/2015   Neuropathy 05/11/2015   Adiposity 05/11/2015   Obstructive apnea 05/11/2015   Arthritis, degenerative 05/11/2015    Conditions to be addressed/monitored:  HTN, COPD, and DMII ; Limited social support ADL,IADL limitations  Care Plan : [facility] (Adult)  Updates made by Wenda Overland, LCSW since 08/08/2021 12:00 AM     Problem: CHL AMB "PATIENT-SPECIFIC PROBLEM"   Priority: Medium  Note:   CARE PLAN ENTRY (see longitudinal plan of care for additional care plan information)  Current Barriers:  Patient with HTN, COPD, and DM in need of assistance with connection to community resources  Knowledge deficits and need for support, education and care coordination related to community resources support  Limited social support  Clinical Goal(s)  Over the next 90 days, patient will work with care management team member to address concerns related to community resources for in home care   Interventions provided by LCSW:  Continued to assess patient's care coordination needs related to in home care needs and discussed ongoing care management follow up  Collaboration with patient's daughter- continues to report limited social support and are in need for resources Referral to the [facility] [employer] [facility] discussed-patient and daughter agreeable. Referral previously completed and securely emailed to the [facility] Alternatives Program office for review however it has now been confirmed that this program is a Medicaid funded program-it was recommended that patient complete referral process as patient may be eligible if agreeable to a co-pay for the services Collaboration with patient's provider on 08/02/21-Community Alternatives Program Level of Care Request Worksheet completed by patient's provider-completed form faxed to the Department of Human Services [phone]  Patient confirmed  no correspondence received to date, however patient's daughter advised to contact this social worker in regards to correspondence received from the Department of [employer] for further follow up. Alternative options for in home care continue to be explored  Patient Self Care Activities & Deficits:  Patient is unable to independently navigate community resource options without care coordination support  Acknowledges deficits and is motivated to resolve concern  Limited social support Self administers medications as prescribed Attends all scheduled provider appointments  Please see past updates related to this goal by clicking on the "Past Updates" button in the selected goal         Follow Up Plan: SW will follow up with patient by phone over the next 14 business days       [place], [place] Clinical Social Worker  [facility] Care Management [phone]

## 2021-08-08 NOTE — Patient Instructions (Signed)
Visit Information   Goals Addressed             This Visit's Progress    Find Help in My Community       Timeframe:  Long-Range Goal Priority:  Medium Start Date:   07/05/21                          Expected End Date:    12/2820                Follow Up Date 08/23/21    - begin a notebook of services in my neighborhood or community - follow-up on any referrals for help I am given - think ahead to make sure my need does not become an emergency - have a back-up plan - make a list of family or friends that I can call  -continue follow up with CAP(Community Alternatives Referral)   Why is this important?   Knowing how and where to find help for yourself or family in your neighborhood and community is an important skill.  You will want to take some steps to learn how.    Notes:         The patient verbalized understanding of instructions, educational materials, and care plan provided today and declined offer to receive copy of patient instructions, educational materials, and care plan.   Telephone follow up appointment with care management team member scheduled for:08/22/21   Verna Czech, LCSW Clinical Social Worker  [facility] Care Management [phone]

## 2021-08-14 DIAGNOSIS — J449 Chronic obstructive pulmonary disease, unspecified: Secondary | ICD-10-CM | POA: Diagnosis not present

## 2021-08-15 DIAGNOSIS — J449 Chronic obstructive pulmonary disease, unspecified: Secondary | ICD-10-CM | POA: Diagnosis not present

## 2021-08-17 DIAGNOSIS — R0902 Hypoxemia: Secondary | ICD-10-CM | POA: Diagnosis not present

## 2021-08-17 DIAGNOSIS — I272 Pulmonary hypertension, unspecified: Secondary | ICD-10-CM | POA: Diagnosis not present

## 2021-08-17 DIAGNOSIS — Z87891 Personal history of nicotine dependence: Secondary | ICD-10-CM | POA: Diagnosis not present

## 2021-08-17 DIAGNOSIS — J8410 Pulmonary fibrosis, unspecified: Secondary | ICD-10-CM | POA: Diagnosis not present

## 2021-08-17 DIAGNOSIS — I10 Essential (primary) hypertension: Secondary | ICD-10-CM | POA: Diagnosis not present

## 2021-08-17 DIAGNOSIS — R0602 Shortness of breath: Secondary | ICD-10-CM | POA: Diagnosis not present

## 2021-08-17 DIAGNOSIS — I729 Aneurysm of unspecified site: Secondary | ICD-10-CM | POA: Diagnosis not present

## 2021-08-17 DIAGNOSIS — E7849 Other hyperlipidemia: Secondary | ICD-10-CM | POA: Diagnosis not present

## 2021-08-17 DIAGNOSIS — I5032 Chronic diastolic (congestive) heart failure: Secondary | ICD-10-CM | POA: Diagnosis not present

## 2021-08-17 DIAGNOSIS — I209 Angina pectoris, unspecified: Secondary | ICD-10-CM | POA: Diagnosis not present

## 2021-08-22 ENCOUNTER — Ambulatory Visit: Payer: Medicare Other | Admitting: *Deleted

## 2021-08-22 DIAGNOSIS — E113299 Type 2 diabetes mellitus with mild nonproliferative diabetic retinopathy without macular edema, unspecified eye: Secondary | ICD-10-CM

## 2021-08-22 DIAGNOSIS — I10 Essential (primary) hypertension: Secondary | ICD-10-CM

## 2021-08-22 DIAGNOSIS — I509 Heart failure, unspecified: Secondary | ICD-10-CM

## 2021-08-23 NOTE — Chronic Care Management (AMB) (Signed)
Chronic Care Management    Clinical Social Work Note  08/23/2021 Name: Kristen Schroeder MRN: [id number] DOB: 02-04-1941  Kristen Schroeder is a 80 y.o. year old female who is a primary care patient of Maple Hudson., MD. The CCM team was consulted to assist the patient with chronic disease management and/or care coordination needs related to: Level of Care Concerns and Caregiver Stress.   Collaboration with patient's daughter  for follow up visit in response to provider referral for social work chronic care management and care coordination services.   Consent to Services:  The patient was given information about Chronic Care Management services, agreed to services, and gave verbal consent prior to initiation of services.  Please see initial visit note for detailed documentation.   Patient agreed to services and consent obtained.   Assessment: Review of patient past medical history, allergies, medications, and health status, including review of relevant consultants reports was performed today as part of a comprehensive evaluation and provision of chronic care management and care coordination services.     SDOH (Social Determinants of Health) assessments and interventions performed:    Advanced Directives Status: Not addressed in this encounter.  CCM Care Plan  Allergies  Allergen Reactions   Codeine Nausea And Vomiting and Nausea Only    Outpatient Encounter Medications as of 08/22/2021  Medication Sig Note   acetaminophen (TYLENOL) 500 MG tablet Take 500 mg by mouth every 6 (six) hours as needed.    aspirin EC 81 MG tablet Take 81 mg by mouth daily.    cetirizine (ZYRTEC) 10 MG tablet TAKE 1 TABLET BY MOUTH EVERY DAY    enalapril (VASOTEC) 20 MG tablet TAKE 1 TABLET BY MOUTH EVERY DAY    furosemide (LASIX) 20 MG tablet TAKE 2 TABLETS (40MG  TOTAL) BY MOUTH EVERY DAY    gabapentin (NEURONTIN) 300 MG capsule TAKE 2 CAPSULES IN THE MORNING, 1 CAPSULE IN THE AFTERNOON AND 2 CAPSULES  IN THE EVENING    ipratropium-albuterol (DUONEB) 0.5-2.5 (3) MG/3ML SOLN INHALE 1 VIAL VIA NEBULIZER BY MOUTH 3 TIMES A DAY AS NEEDED FOR COPD/ASTHMA    isosorbide mononitrate (IMDUR) 30 MG 24 hr tablet Take 1 tablet (30 mg total) by mouth daily as needed (high bp).    levothyroxine (SYNTHROID) 125 MCG tablet TAKE 1 TABLET BY MOUTH EVERY DAY    meclizine (ANTIVERT) 25 MG tablet Take 1 tablet (25 mg total) by mouth 3 (three) times daily as needed for dizziness.    MULTIPLE VITAMIN PO Take 1 tablet by mouth daily.     nirmatrelvir/ritonavir EUA (PAXLOVID) TABS (Take nirmatrelvir 150 mg two tablets twice daily for 5 days and ritonavir 100 mg one tablet twice daily for 5 days) Patient GFR is 87. (Patient not taking: Reported on 06/27/2021)    omeprazole (PRILOSEC) 40 MG capsule TAKE 1 CAPSULE BY MOUTH EVERY DAY    ondansetron (ZOFRAN-ODT) 8 MG disintegrating tablet Take by mouth.    OXYGEN Inhale 4 L into the lungs. 10/15/2018: Patient uses 3 to 3 1/2 liters continuously   phenylephrine (NEO-SYNEPHRINE) 1 % nasal spray Place 1 drop into both nostrils every 6 (six) hours as needed for congestion.    sertraline (ZOLOFT) 50 MG tablet Take 1 tablet (50 mg total) by mouth daily.    No facility-administered encounter medications on file as of 08/22/2021.    Patient Active Problem List   Diagnosis Date Noted   Depression, major, single episode, complete remission (HCC) 05/20/2020  Incarcerated hernia of abdominal cavity 08/04/2018   SBO (small bowel obstruction) (HCC) 04/22/2018   Incarcerated hernia 04/14/2018   Aneurysm (HCC) 07/10/2016   Nonspecific abnormal finding 05/11/2015   Allergic rhinitis 05/11/2015   Anxiety 05/11/2015   Bronchitis, chronic (HCC) 05/11/2015   CCF (congestive cardiac failure) (HCC) 05/11/2015   Clinical depression 05/11/2015   DDD (degenerative disc disease), 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/11/2015   Essential (primary) hypertension 05/11/2015   Acid reflux 05/11/2015   HLD  (hyperlipidemia) 05/11/2015   Adult hypothyroidism 05/11/2015   Cervical dysplasia, mild 05/11/2015   Neuropathy 05/11/2015   Adiposity 05/11/2015   Obstructive apnea 05/11/2015   Arthritis, degenerative 05/11/2015    Conditions to be addressed/monitored:  HTN, COPD, and DMII ; Limited social support ADL,IADL limitations Care Plan : [facility] Pharmacy (Adult)  Updates made by Wenda Overland, LCSW since 08/23/2021 12:00 AM     Problem: CHL AMB "PATIENT-SPECIFIC PROBLEM"   Priority: Medium  Note:   CARE PLAN ENTRY (see longitudinal plan of care for additional care plan information)  Current Barriers:  Patient with HTN, COPD, and DM in need of assistance with connection to community resources  Knowledge deficits and need for support, education and care coordination related to community resources support  Limited social support  Clinical Goal(s)  Over the next 90 days, patient will work with care management team member to address concerns related to community resources for in home care    Continued to assess patient's care coordination needs related to in home care needs and discussed ongoing care management follow up  Confirmed that patient's daughter is in the process of making patient's bathroom accessible-walk-in shower with seat-widening the door Continued use of wheelchaior discussed due to minimal stamina Collaboration with patient's daughter- continues to report limited social support and are in need for resources Status of Referral to the [facility] Alternatives Program discussed-VM left for the Department of Health and [employer] regarding status of referral Alternative options for in home care continue to be explored Caregiver stress acknowledged Positive reinforcement provided  Patient Self Care Activities & Deficits:  Patient is unable to independently navigate community resource options without care coordination support  Acknowledges deficits and  is motivated to resolve concern  Limited social support Self administers medications as prescribed Attends all scheduled provider appointments  Please see past updates related to this goal by clicking on the "Past Updates" button in the selected goal         Follow Up Plan: SW will follow up with patient by phone over the next 14 business days       [place], [place] Clinical Social Worker  [facility] [phone]

## 2021-08-23 NOTE — Patient Instructions (Signed)
Visit Information   Goals Addressed             This Visit's Progress    Find Help in My Community       Timeframe:  Long-Range Goal Priority:  Medium Start Date:   07/05/21                          Expected End Date:    12/2820                Follow Up Date 08/30/21    - begin a notebook of services in my neighborhood or community - follow-up on any referrals for help I am given - think ahead to make sure my need does not become an emergency - have a back-up plan - make a list of family or friends that I can call  -continue follow up with CAP(Community Alternatives Referral)   Why is this important?   Knowing how and where to find help for yourself or family in your neighborhood and community is an important skill.  You will want to take some steps to learn how.    Notes:         The patient verbalized understanding of instructions, educational materials, and care plan provided today and declined offer to receive copy of patient instructions, educational materials, and care plan.   Telephone follow up appointment with care management team member scheduled for: 08/30/21   Verna Czech, LCSW Clinical Social Worker  [facility] Care [facility] [phone]

## 2021-08-25 ENCOUNTER — Telehealth: Payer: Self-pay

## 2021-08-25 NOTE — Telephone Encounter (Signed)
Please advise? Patient was referred to [facility] in August and has been communicating with [employer].

## 2021-08-25 NOTE — Telephone Encounter (Signed)
Copied from CRM [id number]. Topic: General - Other >> Aug 25, 2021 11:55 AM Kristen Schroeder wrote: Reason for CRM: Pt was advised by insurance to call Dr. Sullivan Lone about home health aid to assist pt in home / pt is qualified but needs Dr. Sullivan Lone to assist with this / pt and husband needs someone in the home with them at times to help / Pt stated she spoke with dr. Sullivan Lone about this and was calling for update /please advise

## 2021-08-28 ENCOUNTER — Other Ambulatory Visit: Payer: Self-pay | Admitting: Internal Medicine

## 2021-08-30 ENCOUNTER — Telehealth: Payer: Medicare Other

## 2021-09-01 DIAGNOSIS — J449 Chronic obstructive pulmonary disease, unspecified: Secondary | ICD-10-CM | POA: Diagnosis not present

## 2021-09-04 DIAGNOSIS — E113299 Type 2 diabetes mellitus with mild nonproliferative diabetic retinopathy without macular edema, unspecified eye: Secondary | ICD-10-CM | POA: Diagnosis not present

## 2021-09-04 DIAGNOSIS — I10 Essential (primary) hypertension: Secondary | ICD-10-CM | POA: Diagnosis not present

## 2021-09-04 DIAGNOSIS — I509 Heart failure, unspecified: Secondary | ICD-10-CM | POA: Diagnosis not present

## 2021-09-05 ENCOUNTER — Telehealth: Payer: Medicare Other | Admitting: *Deleted

## 2021-09-06 ENCOUNTER — Other Ambulatory Visit: Payer: Self-pay | Admitting: Family Medicine

## 2021-09-06 DIAGNOSIS — R059 Cough, unspecified: Secondary | ICD-10-CM

## 2021-09-06 NOTE — Telephone Encounter (Signed)
Pt called about refill for Zpac / advised pt request was sent from pharmacy / please advise pt stated that she feels a cold coming on and knows Dr. Sullivan Lone doesn't want her to get sick

## 2021-09-07 ENCOUNTER — Ambulatory Visit (INDEPENDENT_AMBULATORY_CARE_PROVIDER_SITE_OTHER): Payer: Medicare Other | Admitting: *Deleted

## 2021-09-07 DIAGNOSIS — E113299 Type 2 diabetes mellitus with mild nonproliferative diabetic retinopathy without macular edema, unspecified eye: Secondary | ICD-10-CM

## 2021-09-07 DIAGNOSIS — I10 Essential (primary) hypertension: Secondary | ICD-10-CM

## 2021-09-07 DIAGNOSIS — I509 Heart failure, unspecified: Secondary | ICD-10-CM

## 2021-09-08 ENCOUNTER — Ambulatory Visit: Payer: Self-pay | Admitting: *Deleted

## 2021-09-08 ENCOUNTER — Encounter: Payer: Self-pay | Admitting: *Deleted

## 2021-09-08 ENCOUNTER — Telehealth: Payer: Medicare Other

## 2021-09-08 DIAGNOSIS — I509 Heart failure, unspecified: Secondary | ICD-10-CM

## 2021-09-08 DIAGNOSIS — I10 Essential (primary) hypertension: Secondary | ICD-10-CM

## 2021-09-08 DIAGNOSIS — E113299 Type 2 diabetes mellitus with mild nonproliferative diabetic retinopathy without macular edema, unspecified eye: Secondary | ICD-10-CM

## 2021-09-08 NOTE — Telephone Encounter (Signed)
Reason for Disposition  [1] Numbness or tingling in one or both feet AND [2] is a chronic symptom (recurrent or ongoing AND present > 4 weeks)  Answer Assessment - Initial Assessment Questions 1. SYMPTOM: "What is the main symptom you are concerned about?" (e.g., weakness, numbness)     Numbness in bilateral feet x 1 year and medication causing her to be very sleeping during the day . 2. ONSET: "When did this start?" (minutes, hours, days; while sleeping)     Na  3. LAST NORMAL: "When was the last time you (the patient) were normal (no symptoms)?"     na 4. PATTERN "Does this come and go, or has it been constant since it started?"  "Is it present now?"     Patient reports when taking gabapentin she gets very sleeping during the day. 5. CARDIAC SYMPTOMS: "Have you had any of the following symptoms: chest pain, difficulty breathing, palpitations?"     na 6. NEUROLOGIC SYMPTOMS: "Have you had any of the following symptoms: headache, dizziness, vision loss, double vision, changes in speech, unsteady on your feet?"     Neuropathy in bilateral feet 7. OTHER SYMPTOMS: "Do you have any other symptoms?"     Numbness in left shoulder comes and goes  8. PREGNANCY: "Is there any chance you are pregnant?" "When was your last menstrual period?"     na  Protocols used: Neurologic Deficit-A-AH

## 2021-09-08 NOTE — Patient Instructions (Signed)
Visit Information  PATIENT GOALS/PLAN OF CARE:  Care Plan : [facility] (Adult)  Updates made by Wenda Overland, LCSW since 09/08/2021 12:00 AM     Problem: CHL AMB "PATIENT-SPECIFIC PROBLEM"   Priority: Medium  Note:   CARE PLAN ENTRY (see longitudinal plan of care for additional care plan information)  Current Barriers:  Patient with HTN, COPD, and DM in need of assistance with connection to community resources  Knowledge deficits and need for support, education and care coordination related to community resources support  Limited social support  Clinical Goal(s)  Over the next 90 days, patient will work with care management team member to address concerns related to community resources for in home care   Interventions Continued to assess patient's care coordination needs related to in home care needs and discussed ongoing care management follow up Confirmed that patient's daughter is in the continued process of making patient's bathroom accessible-walk-in shower with seat-widening the door Continued use of wheelchaior discussed due to minimal stamina Collaboration with patient's daughter- continues to report limited social support and are in need for resources Status of Referral to the [facility] Alternatives Program discussed-per patient's daughter she received a letter stating that the documents were not received and would need to be re-generated Document faxed to this Child psychotherapist for further review Family conflicts discussed causing increased anxiety Caregiver stress acknowledged, emotional support provided Use of positive coping strategies reinforced Positive reinforcement provided for daughter's care giving efforts  Patient Self Care Activities & Deficits:  Patient is unable to independently navigate community resource options without care coordination support  Acknowledges deficits and is motivated to resolve concern  Limited social support Self  administers medications as prescribed Attends all scheduled provider appointments  Please see past updates related to this goal by clicking on the "Past Updates" button in the selected goal       The patient verbalized understanding of instructions, educational materials, and care plan provided today and declined offer to receive copy of patient instructions, educational materials, and care plan.   Telephone follow up appointment with care management team member scheduled for: 09/11/21   Verna Czech, LCSW Clinical Social Worker  [facility] Care [facility] [phone]

## 2021-09-08 NOTE — Chronic Care Management (AMB) (Signed)
Chronic Care Management    Clinical Social Work Note  09/08/2021 Name: Kristen Schroeder MRN: [id number] DOB: May 08, 1941  Kristen Schroeder is a 80 y.o. year old female who is a primary care patient of Maple Hudson., MD. The CCM team was consulted to assist the patient with chronic disease management and/or care coordination needs related to: [employer] , Level of Care Concerns, and Caregiver Stress.   Collaboration with patient's daughter  for follow up visit in response to provider referral for social work chronic care management and care coordination services.   Consent to Services:  The patient was given information about Chronic Care Management services, agreed to services, and gave verbal consent prior to initiation of services.  Please see initial visit note for detailed documentation.   Patient agreed to services and consent obtained.   Assessment: Review of patient past medical history, allergies, medications, and health status, including review of relevant consultants reports was performed today as part of a comprehensive evaluation and provision of chronic care management and care coordination services.     SDOH (Social Determinants of Health) assessments and interventions performed:    Advanced Directives Status: Not addressed in this encounter.  CCM Care Plan  Allergies  Allergen Reactions   Codeine Nausea And Vomiting and Nausea Only    Outpatient Encounter Medications as of 09/07/2021  Medication Sig Note   acetaminophen (TYLENOL) 500 MG tablet Take 500 mg by mouth every 6 (six) hours as needed.    aspirin EC 81 MG tablet Take 81 mg by mouth daily.    cetirizine (ZYRTEC) 10 MG tablet TAKE 1 TABLET BY MOUTH EVERY DAY    enalapril (VASOTEC) 20 MG tablet TAKE 1 TABLET BY MOUTH EVERY DAY    furosemide (LASIX) 20 MG tablet TAKE 2 TABLETS (40MG  TOTAL) BY MOUTH EVERY DAY    gabapentin (NEURONTIN) 300 MG capsule TAKE 2 CAPSULES IN THE MORNING, 1 CAPSULE IN THE  AFTERNOON AND 2 CAPSULES IN THE EVENING    ipratropium-albuterol (DUONEB) 0.5-2.5 (3) MG/3ML SOLN INHALE 1 VIAL VIA NEBULIZER BY MOUTH 3 TIMES A DAY AS NEEDED FOR COPD/ASTHMA    isosorbide mononitrate (IMDUR) 30 MG 24 hr tablet Take 1 tablet (30 mg total) by mouth daily as needed (high bp).    levothyroxine (SYNTHROID) 125 MCG tablet TAKE 1 TABLET BY MOUTH EVERY DAY    meclizine (ANTIVERT) 25 MG tablet Take 1 tablet (25 mg total) by mouth 3 (three) times daily as needed for dizziness.    MULTIPLE VITAMIN PO Take 1 tablet by mouth daily.     nirmatrelvir/ritonavir EUA (PAXLOVID) TABS (Take nirmatrelvir 150 mg two tablets twice daily for 5 days and ritonavir 100 mg one tablet twice daily for 5 days) Patient GFR is 87. (Patient not taking: Reported on 06/27/2021)    omeprazole (PRILOSEC) 40 MG capsule TAKE 1 CAPSULE BY MOUTH EVERY DAY    ondansetron (ZOFRAN-ODT) 8 MG disintegrating tablet Take by mouth.    OXYGEN Inhale 4 L into the lungs. 10/15/2018: Patient uses 3 to 3 1/2 liters continuously   phenylephrine (NEO-SYNEPHRINE) 1 % nasal spray Place 1 drop into both nostrils every 6 (six) hours as needed for congestion.    sertraline (ZOLOFT) 50 MG tablet Take 1 tablet (50 mg total) by mouth daily.    No facility-administered encounter medications on file as of 09/07/2021.    Patient Active Problem List   Diagnosis Date Noted   Depression, major, single episode, complete remission (HCC)  05/20/2020   Incarcerated hernia of abdominal cavity 08/04/2018   SBO (small bowel obstruction) (HCC) 04/22/2018   Incarcerated hernia 04/14/2018   Aneurysm (HCC) 07/10/2016   Nonspecific abnormal finding 05/11/2015   Allergic rhinitis 05/11/2015   Anxiety 05/11/2015   Bronchitis, chronic (HCC) 05/11/2015   CCF (congestive cardiac failure) (HCC) 05/11/2015   Clinical depression 05/11/2015   DDD (degenerative disc disease), 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/11/2015   Essential (primary) hypertension 05/11/2015   Acid reflux  05/11/2015   HLD (hyperlipidemia) 05/11/2015   Adult hypothyroidism 05/11/2015   Cervical dysplasia, mild 05/11/2015   Neuropathy 05/11/2015   Adiposity 05/11/2015   Obstructive apnea 05/11/2015   Arthritis, degenerative 05/11/2015    Conditions to be addressed/monitored:   HTN, COPD, and DMII ; Limited social support ADL,IADL limitations Care Plan : [facility]acy (Adult)  Updates made by Wenda Overland, LCSW since 09/08/2021 12:00 AM     Problem: CHL AMB "PATIENT-SPECIFIC PROBLEM"   Priority: Medium  Note:   CARE PLAN ENTRY (see longitudinal plan of care for additional care plan information)  Current Barriers:  Patient with HTN, COPD, and DM in need of assistance with connection to community resources  Knowledge deficits and need for support, education and care coordination related to community resources support  Limited social support  Clinical Goal(s)  Over the next 90 days, patient will work with care management team member to address concerns related to community resources for in home care   Interventions Continued to assess patient's care coordination needs related to in home care needs and discussed ongoing care management follow up Confirmed that patient's daughter is in the continued process of making patient's bathroom accessible-walk-in shower with seat-widening the door Continued use of wheelchaior discussed due to minimal stamina Collaboration with patient's daughter- continues to report limited social support and are in need for resources Status of Referral to the [facility] Alternatives Program discussed-per patient's daughter she received a letter stating that the documents were not received and would need to be re-generated Document faxed to this Child psychotherapist for further review Family conflicts discussed causing increased anxiety Caregiver stress acknowledged, emotional support provided Use of positive coping strategies reinforced Positive  reinforcement provided for daughter's care giving efforts  Patient Self Care Activities & Deficits:  Patient is unable to independently navigate community resource options without care coordination support  Acknowledges deficits and is motivated to resolve concern  Limited social support Self administers medications as prescribed Attends all scheduled provider appointments  Please see past updates related to this goal by clicking on the "Past Updates" button in the selected goal         Follow Up Plan: SW will follow up with patient by phone over the next 14 business days       [place], [place] Clinical Social Worker  [facility] Care Management [phone]

## 2021-09-08 NOTE — Progress Notes (Signed)
This encounter was created in error - please disregard.

## 2021-09-08 NOTE — Telephone Encounter (Signed)
C/o bilateral feet numbness x 1 year. C/o numbness in left shoulder comes and goes now. Denies weakness or numbness on one side of body or face. No other neurological symptoms . C/o gabapentin makes her very sleepy during the day and would like to know what she can do . Medication is working for numbness in feet but makes her too sleepy . Please advise . No available appt with in 3 days as protocol suggested. Appt already scheduled for 11/14/21. Care advise given. Patient verbalized understanding of care advise and to call back if symptoms worsen. Please advise if earlier appt can be arranged.

## 2021-09-08 NOTE — Chronic Care Management (AMB) (Signed)
Chronic Care Management    Clinical Social Work Note  09/08/2021 Name: Kristen Schroeder MRN: [id number] DOB: 1941/03/23  Kristen Schroeder is a 80 y.o. year old female who is a primary care patient of Maple Hudson., MD. The CCM team was consulted to assist the patient with chronic disease management and/or care coordination needs related to: Community Resources  and Level of Care Concerns.   Collaboration with the [employer]  for  follow up regarding in home care services  in response to provider referral for social work chronic care management and care coordination services.   Consent to Services:  The patient was given information about Chronic Care Management services, agreed to services, and gave verbal consent prior to initiation of services.  Please see initial visit note for detailed documentation.   Patient agreed to services and consent obtained.   Assessment: Review of patient past medical history, allergies, medications, and health status, including review of relevant consultants reports was performed today as part of a comprehensive evaluation and provision of chronic care management and care coordination services.     SDOH (Social Determinants of Health) assessments and interventions performed:    Advanced Directives Status: Not addressed in this encounter.  CCM Care Plan  Allergies  Allergen Reactions   Codeine Nausea And Vomiting and Nausea Only    Outpatient Encounter Medications as of 09/08/2021  Medication Sig Note   acetaminophen (TYLENOL) 500 MG tablet Take 500 mg by mouth every 6 (six) hours as needed.    aspirin EC 81 MG tablet Take 81 mg by mouth daily.    cetirizine (ZYRTEC) 10 MG tablet TAKE 1 TABLET BY MOUTH EVERY DAY    enalapril (VASOTEC) 20 MG tablet TAKE 1 TABLET BY MOUTH EVERY DAY    furosemide (LASIX) 20 MG tablet TAKE 2 TABLETS (40MG  TOTAL) BY MOUTH EVERY DAY    gabapentin (NEURONTIN) 300 MG capsule TAKE 2 CAPSULES IN THE  MORNING, 1 CAPSULE IN THE AFTERNOON AND 2 CAPSULES IN THE EVENING    ipratropium-albuterol (DUONEB) 0.5-2.5 (3) MG/3ML SOLN INHALE 1 VIAL VIA NEBULIZER BY MOUTH 3 TIMES A DAY AS NEEDED FOR COPD/ASTHMA    isosorbide mononitrate (IMDUR) 30 MG 24 hr tablet Take 1 tablet (30 mg total) by mouth daily as needed (high bp).    levothyroxine (SYNTHROID) 125 MCG tablet TAKE 1 TABLET BY MOUTH EVERY DAY    meclizine (ANTIVERT) 25 MG tablet Take 1 tablet (25 mg total) by mouth 3 (three) times daily as needed for dizziness.    MULTIPLE VITAMIN PO Take 1 tablet by mouth daily.     nirmatrelvir/ritonavir EUA (PAXLOVID) TABS (Take nirmatrelvir 150 mg two tablets twice daily for 5 days and ritonavir 100 mg one tablet twice daily for 5 days) Patient GFR is 87. (Patient not taking: Reported on 06/27/2021)    omeprazole (PRILOSEC) 40 MG capsule TAKE 1 CAPSULE BY MOUTH EVERY DAY    ondansetron (ZOFRAN-ODT) 8 MG disintegrating tablet Take by mouth.    OXYGEN Inhale 4 L into the lungs. 10/15/2018: Patient uses 3 to 3 1/2 liters continuously   phenylephrine (NEO-SYNEPHRINE) 1 % nasal spray Place 1 drop into both nostrils every 6 (six) hours as needed for congestion.    sertraline (ZOLOFT) 50 MG tablet Take 1 tablet (50 mg total) by mouth daily.    No facility-administered encounter medications on file as of 09/08/2021.    Patient Active Problem List   Diagnosis Date Noted   Depression,  major, single episode, complete remission (HCC) 05/20/2020   Incarcerated hernia of abdominal cavity 08/04/2018   SBO (small bowel obstruction) (HCC) 04/22/2018   Incarcerated hernia 04/14/2018   Aneurysm (HCC) 07/10/2016   Nonspecific abnormal finding 05/11/2015   Allergic rhinitis 05/11/2015   Anxiety 05/11/2015   Bronchitis, chronic (HCC) 05/11/2015   CCF (congestive cardiac failure) (HCC) 05/11/2015   Clinical depression 05/11/2015   DDD (degenerative disc disease), 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/11/2015   Essential (primary) hypertension  05/11/2015   Acid reflux 05/11/2015   HLD (hyperlipidemia) 05/11/2015   Adult hypothyroidism 05/11/2015   Cervical dysplasia, mild 05/11/2015   Neuropathy 05/11/2015   Adiposity 05/11/2015   Obstructive apnea 05/11/2015   Arthritis, degenerative 05/11/2015    Conditions to be addressed/monitored:  HTN, COPD, and DMII ; Limited social support ADL,IADL limitations Care Plan : [facility]acy (Adult)  Updates made by Wenda Overland, LCSW since 09/08/2021 12:00 AM     Problem: CHL AMB "PATIENT-SPECIFIC PROBLEM"   Priority: Medium  Note:   CARE PLAN ENTRY (see longitudinal plan of care for additional care plan information)  Current Barriers:  Patient with HTN, COPD, and DM in need of assistance with connection to community resources  Knowledge deficits and need for support, education and care coordination related to community resources support  Limited social support  Clinical Goal(s)  Over the next 90 days, patient will work with care management team member to address concerns related to community resources for in home care   Interventions Continued to assess patient's care coordination needs related to in home care needs and discussed ongoing care management follow up Confirmed that patient's daughter is in the continued process of making patient's bathroom accessible-walk-in shower with seat-widening the door Continued use of wheelchaior discussed due to minimal stamina Collaboration with patient's daughter- continues to report limited social support and are in need for resources Status of Referral to the [facility] Alternatives Program discussed-per patient's daughter she received a letter stating that the documents were not received and would need to be re-generated Document faxed to this Child psychotherapist for further review Family conflicts discussed causing increased anxiety Caregiver stress acknowledged, emotional support provided Use of positive coping  strategies reinforced Positive reinforcement provided for daughter's care giving efforts 09/08/21 Care Coordination phone call [facility] for the CAPS program. Patient's referral discussed and it was discovered that patient's application was returned due to a section of the referral being incomplete. Section completed and will be re-sent for processing. Patient's daughter contacted and was informed of the delay related to CAP services determination.  Patient Self Care Activities & Deficits:  Patient is unable to independently navigate community resource options without care coordination support  Acknowledges deficits and is motivated to resolve concern  Limited social support Self administers medications as prescribed Attends all scheduled provider appointments  Please see past updates related to this goal by clicking on the "Past Updates" button in the selected goal         Follow Up Plan: SW will follow up with patient by phone over the next 14 business days       [place], [place] Clinical Social Worker  [facility] [phone]

## 2021-09-11 ENCOUNTER — Telehealth: Payer: Medicare Other

## 2021-09-11 NOTE — Telephone Encounter (Signed)
Please advise if earlier appointment?

## 2021-09-12 ENCOUNTER — Telehealth: Payer: Medicare Other | Admitting: *Deleted

## 2021-09-14 ENCOUNTER — Telehealth: Payer: Self-pay

## 2021-09-14 DIAGNOSIS — J449 Chronic obstructive pulmonary disease, unspecified: Secondary | ICD-10-CM | POA: Diagnosis not present

## 2021-09-14 NOTE — Telephone Encounter (Signed)
I could see her the week before or the week after Thanksgiving but that is all I have.

## 2021-09-14 NOTE — Telephone Encounter (Signed)
Copied from CRM [id number]. Topic: Appointment Scheduling - Scheduling Inquiry for Clinic >> Sep 14, 2021 10:07 AM Crist Infante wrote: Reason for CRM: pt following up on request to see Dr Sullivan Lone.  Nurse triage notes are in the chart

## 2021-09-14 NOTE — Telephone Encounter (Signed)
Please advise, patient is requesting earlier appointment? Patient has an appt scheduled on 11/14/2021.

## 2021-09-15 DIAGNOSIS — J449 Chronic obstructive pulmonary disease, unspecified: Secondary | ICD-10-CM | POA: Diagnosis not present

## 2021-09-19 ENCOUNTER — Ambulatory Visit (INDEPENDENT_AMBULATORY_CARE_PROVIDER_SITE_OTHER): Payer: Medicare Other | Admitting: Nurse Practitioner

## 2021-09-19 ENCOUNTER — Other Ambulatory Visit: Payer: Self-pay

## 2021-09-19 ENCOUNTER — Encounter: Payer: Self-pay | Admitting: Nurse Practitioner

## 2021-09-19 VITALS — BP 160/90 | HR 98 | Temp 98.4°F | Resp 16 | Ht 65.0 in | Wt 204.0 lb

## 2021-09-19 DIAGNOSIS — R0602 Shortness of breath: Secondary | ICD-10-CM

## 2021-09-19 DIAGNOSIS — J449 Chronic obstructive pulmonary disease, unspecified: Secondary | ICD-10-CM

## 2021-09-19 DIAGNOSIS — F329 Major depressive disorder, single episode, unspecified: Secondary | ICD-10-CM

## 2021-09-19 DIAGNOSIS — F419 Anxiety disorder, unspecified: Secondary | ICD-10-CM | POA: Diagnosis not present

## 2021-09-19 MED ORDER — SERTRALINE HCL 50 MG PO TABS: 50.0000 mg | ORAL_TABLET | Freq: Every day | ORAL | 3 refills | Status: AC

## 2021-09-19 MED ORDER — BREZTRI AEROSPHERE 160-9-4.8 MCG/ACT IN AERO: 2.0000 | INHALATION_SPRAY | Freq: Two times a day (BID) | RESPIRATORY_TRACT | 11 refills | Status: AC

## 2021-09-19 NOTE — Progress Notes (Signed)
[facility] [address] [place], [place] [address]  Internal MEDICINE  Office Visit Note  Patient Name: Kristen Schroeder  [id number]  [id number]  Date of Service: 09/19/2021  Chief Complaint  Patient presents with   Acute Visit    Breathing doesn't feel right, pt thinks it is panic attacks, wants breathing checked      HPI Maxcine presents for an acute sick visit for shortness of breath. She reports that her breathing does not feel right. She thinks it could be a panic attack but wants her breathing checked. She has been using duoneb treatment 2-3 times per day. She is not on a maintenance inhaler. She has COPD. Her blood pressure is elevated and patient was encouraged to discuss with her PCP or cardiologist.    Current Medication:  Outpatient Encounter Medications as of 09/19/2021  Medication Sig Note   acetaminophen (TYLENOL) 500 MG tablet Take 500 mg by mouth every 6 (six) hours as needed.    aspirin EC 81 MG tablet Take 81 mg by mouth daily.    Budeson-Glycopyrrol-Formoterol (BREZTRI AEROSPHERE) 160-9-4.8 MCG/ACT AERO Inhale 2 puffs into the lungs 2 (two) times daily.    cetirizine (ZYRTEC) 10 MG tablet TAKE 1 TABLET BY MOUTH EVERY DAY    enalapril (VASOTEC) 20 MG tablet TAKE 1 TABLET BY MOUTH EVERY DAY    furosemide (LASIX) 20 MG tablet TAKE 2 TABLETS (40MG  TOTAL) BY MOUTH EVERY DAY    gabapentin (NEURONTIN) 300 MG capsule TAKE 2 CAPSULES IN THE MORNING, 1 CAPSULE IN THE AFTERNOON AND 2 CAPSULES IN THE EVENING    ipratropium-albuterol (DUONEB) 0.5-2.5 (3) MG/3ML SOLN INHALE 1 VIAL VIA NEBULIZER BY MOUTH 3 TIMES A DAY AS NEEDED FOR COPD/ASTHMA    levothyroxine (SYNTHROID) 125 MCG tablet TAKE 1 TABLET BY MOUTH EVERY DAY    meclizine (ANTIVERT) 25 MG tablet Take 1 tablet (25 mg total) by mouth 3 (three) times daily as needed for dizziness.    MULTIPLE VITAMIN PO Take 1 tablet by mouth daily.     omeprazole (PRILOSEC) 40 MG capsule TAKE 1 CAPSULE BY MOUTH EVERY DAY     ondansetron (ZOFRAN-ODT) 8 MG disintegrating tablet Take by mouth.    OXYGEN Inhale 4 L into the lungs. 10/15/2018: Patient uses 3 to 3 1/2 liters continuously   phenylephrine (NEO-SYNEPHRINE) 1 % nasal spray Place 1 drop into both nostrils every 6 (six) hours as needed for congestion.    [DISCONTINUED] sertraline (ZOLOFT) 50 MG tablet Take 1 tablet (50 mg total) by mouth daily.    sertraline (ZOLOFT) 50 MG tablet Take 1 tablet (50 mg total) by mouth daily.    [DISCONTINUED] isosorbide mononitrate (IMDUR) 30 MG 24 hr tablet Take 1 tablet (30 mg total) by mouth daily as needed (high bp).    [DISCONTINUED] nirmatrelvir/ritonavir EUA (PAXLOVID) TABS (Take nirmatrelvir 150 mg two tablets twice daily for 5 days and ritonavir 100 mg one tablet twice daily for 5 days) Patient GFR is 87. (Patient not taking: Reported on 06/27/2021)    No facility-administered encounter medications on file as of 09/19/2021.      Medical History: Past Medical History:  Diagnosis Date   Arthritis    COPD (chronic obstructive pulmonary disease) (HCC)    Diabetes mellitus without complication (HCC)    Dysrhythmia    Heart murmur    History of orthopnea    Hypertension    Hypothyroidism    Neuropathy    Oxygen dependent    3L  CONTINUOUS   Pain CHRONIC BACK PAIN   Shortness of breath dyspnea    Wheezing      Vital Signs: BP (!) 173/77   Pulse 98   Temp 98.4 F (36.9 C)   Resp 16   Ht 5\' 5"  (1.651 m)   Wt 204 lb (92.5 kg)   SpO2 98% Comment: 2L oxygen  BMI 33.95 kg/m    Review of Systems  Constitutional:  Negative for chills, fatigue and unexpected weight change.  HENT:  Negative for congestion, rhinorrhea, sneezing and sore throat.   Eyes:  Negative for redness.  Respiratory:  Positive for shortness of breath. Negative for cough, chest tightness and wheezing.        2LPM of continuous, but on 4LPM continuous oxygen at home.   Cardiovascular:  Negative for chest pain and palpitations.   Gastrointestinal:  Negative for abdominal pain, constipation, diarrhea, nausea and vomiting.  Genitourinary:  Negative for dysuria and frequency.  Musculoskeletal:  Negative for arthralgias, back pain, joint swelling and neck pain.  Skin:  Negative for rash.  Neurological: Negative.  Negative for tremors and numbness.  Hematological:  Negative for adenopathy. Does not bruise/bleed easily.  Psychiatric/Behavioral:  Negative for behavioral problems (Depression), sleep disturbance and suicidal ideas. The patient is not nervous/anxious.    Physical Exam Vitals reviewed.  Constitutional:      General: She is not in acute distress.    Appearance: Normal appearance. She is obese. She is not ill-appearing.  HENT:     Head: Normocephalic and atraumatic.  Eyes:     Pupils: Pupils are equal, round, and reactive to light.  Cardiovascular:     Rate and Rhythm: Normal rate and regular rhythm.     Pulses: Normal pulses.     Heart sounds: Normal heart sounds. No murmur heard. Pulmonary:     Effort: Pulmonary effort is normal. No accessory muscle usage or respiratory distress.     Breath sounds: Normal air entry. Examination of the right-lower field reveals decreased breath sounds. Examination of the left-lower field reveals decreased breath sounds. Decreased breath sounds present. No wheezing.  Neurological:     Mental Status: She is alert and oriented to person, place, and time.     Cranial Nerves: No cranial nerve deficit.     Coordination: Coordination normal.     Gait: Gait normal.  Psychiatric:        Mood and Affect: Mood normal.        Behavior: Behavior normal.      Assessment/Plan: 1. Chronic obstructive pulmonary disease, unspecified COPD type (HCC) Patient has chronic respiratory failure with hypoxia and COPD. She is not on a maintenance inhaler. Breztri prescribed and samples provided to patient in office today. Follow up in 3 months or sooner if needed.  -  Budeson-Glycopyrrol-Formoterol (BREZTRI AEROSPHERE) 160-9-4.8 MCG/ACT AERO; Inhale 2 puffs into the lungs 2 (two) times daily.  Dispense: 10.7 g; Refill: 11  2. SOB (shortness of breath) Start new inhaler, use nebulizer or rescue inhaler as needed.  - Budeson-Glycopyrrol-Formoterol (BREZTRI AEROSPHERE) 160-9-4.8 MCG/ACT AERO; Inhale 2 puffs into the lungs 2 (two) times daily.  Dispense: 10.7 g; Refill: 11  3. Anxiety PCP typically prescribes sertraline but patient is in office today and needs refills, refill sent but encouraged patient to call her PCP for additional refills.  - sertraline (ZOLOFT) 50 MG tablet; Take 1 tablet (50 mg total) by mouth daily.  Dispense: 90 tablet; Refill: 3  4. Reactive depression See problem #  3.  - sertraline (ZOLOFT) 50 MG tablet; Take 1 tablet (50 mg total) by mouth daily.  Dispense: 90 tablet; Refill: 3   General Counseling: Will verbalizes understanding of the findings of todays visit and agrees with plan of treatment. I have discussed any further diagnostic evaluation that may be needed or ordered today. We also reviewed her medications today. she has been encouraged to call the office with any questions or concerns that should arise related to todays visit.    Counseling:    No orders of the defined types were placed in this encounter.   Meds ordered this encounter  Medications   Budeson-Glycopyrrol-Formoterol (BREZTRI AEROSPHERE) 160-9-4.8 MCG/ACT AERO    Sig: Inhale 2 puffs into the lungs 2 (two) times daily.    Dispense:  10.7 g    Refill:  11   sertraline (ZOLOFT) 50 MG tablet    Sig: Take 1 tablet (50 mg total) by mouth daily.    Dispense:  90 tablet    Refill:  3    Usually sent by pcp, patient needed refill while in off for pulmonary visit.    Return in about 3 months (around 12/20/2021) for pulmonary only with Xiana Carns or [facility]SK.  [place] Controlled Substance Database was reviewed by me for overdose risk score (ORS)  Time spent:30  Minutes Time spent with patient included reviewing progress notes, labs, imaging studies, and discussing plan for follow up.   This patient was seen by Sallyanne Kuster, FNP-C in collaboration with Dr. Beverely Risen as a part of collaborative care agreement.  Ridge Lafond R. Tedd Sias, MSN, FNP-C Internal Medicine

## 2021-09-19 NOTE — Telephone Encounter (Signed)
Please schedule appointment. Thank you

## 2021-09-20 ENCOUNTER — Ambulatory Visit: Payer: Medicare Other | Admitting: *Deleted

## 2021-09-20 DIAGNOSIS — I509 Heart failure, unspecified: Secondary | ICD-10-CM

## 2021-09-20 DIAGNOSIS — E113299 Type 2 diabetes mellitus with mild nonproliferative diabetic retinopathy without macular edema, unspecified eye: Secondary | ICD-10-CM

## 2021-09-20 DIAGNOSIS — I10 Essential (primary) hypertension: Secondary | ICD-10-CM

## 2021-09-21 ENCOUNTER — Ambulatory Visit: Payer: Medicare Other | Admitting: Internal Medicine

## 2021-09-21 NOTE — Patient Instructions (Signed)
Visit Information  PATIENT GOALS/PLAN OF CARE:  Care Plan : General Pharmacy (Adult)  Updates made by Wenda Overland, LCSW since 09/21/2021 12:00 AM     Problem: CHL AMB "PATIENT-SPECIFIC PROBLEM"   Priority: Medium  Note:   CARE PLAN ENTRY (see longitudinal plan of care for additional care plan information)  Current Barriers:  Patient with HTN, COPD, and DM in need of assistance with connection to community resources  Knowledge deficits and need for support, education and care coordination related to community resources support  Limited social support  Clinical Goal(s)  Over the next 90 days, patient will work with care management team member to address concerns related to community resources for in home care   Interventions Continued to assess patient's care coordination needs related to in home care needs and discussed ongoing care management follow up Confirmed that patient's daughter is in the continued process of making patient's bathroom accessible-walk-in shower with seat-widening the door-just about complete Continued use of wheelchair discussed due to minimal stamina Collaboration with patient's daughter- continues to report limited social support and are in need for resources Status of Referral to the [facility] Alternatives Program discussed Document faxed to this Child psychotherapist for further review-status pending Patient's daughter continues to assist with anxiety management Caregiver stress acknowledged-status of CAP services to be determined Phone call to [employer] Asst for [employer] to update contact information for patient. VM left as well Positive reinforcement continues to be provided for daughter's care giving efforts   Patient Self Care Activities & Deficits:  Patient is unable to independently navigate community resource options without care coordination support  Acknowledges deficits and is motivated  to resolve concern  Limited social support Self administers medications as prescribed Attends all scheduled provider appointments  Please see past updates related to this goal by clicking on the "Past Updates" button in the selected goal       The patient verbalized understanding of instructions, educational materials, and care plan provided today and declined offer to receive copy of patient instructions, educational materials, and care plan.   Telephone follow up appointment with care management team member scheduled for: 10/04/21   Verna Czech, LCSW Clinical Social Worker  [facility] Care [facility] [phone]

## 2021-09-21 NOTE — Chronic Care Management (AMB) (Signed)
Chronic Care Management    Clinical Social Work Note  09/21/2021 Name: Kristen Schroeder MRN: [id number] DOB: 04-07-1941  Kristen Schroeder is a 80 y.o. year old female who is a primary care patient of Maple Hudson., MD. The CCM team was consulted to assist the patient with chronic disease management and/or care coordination needs related to: [employer] .   Collaboration with patient's daughter   for follow up visit in response to provider referral for social work chronic care management and care coordination services.   Consent to Services:  The patient was given information about Chronic Care Management services, agreed to services, and gave verbal consent prior to initiation of services.  Please see initial visit note for detailed documentation.   Patient agreed to services and consent obtained.   Assessment: Review of patient past medical history, allergies, medications, and health status, including review of relevant consultants reports was performed today as part of a comprehensive evaluation and provision of chronic care management and care coordination services.     SDOH (Social Determinants of Health) assessments and interventions performed:    Advanced Directives Status: Not addressed in this encounter.  CCM Care Plan  Allergies  Allergen Reactions   Codeine Nausea And Vomiting and Nausea Only    Outpatient Encounter Medications as of 09/20/2021  Medication Sig Note   acetaminophen (TYLENOL) 500 MG tablet Take 500 mg by mouth every 6 (six) hours as needed.    aspirin EC 81 MG tablet Take 81 mg by mouth daily.    Budeson-Glycopyrrol-Formoterol (BREZTRI AEROSPHERE) 160-9-4.8 MCG/ACT AERO Inhale 2 puffs into the lungs 2 (two) times daily.    cetirizine (ZYRTEC) 10 MG tablet TAKE 1 TABLET BY MOUTH EVERY DAY    enalapril (VASOTEC) 20 MG tablet TAKE 1 TABLET BY MOUTH EVERY DAY    furosemide (LASIX) 20 MG tablet TAKE 2 TABLETS (40MG  TOTAL) BY MOUTH EVERY DAY     gabapentin (NEURONTIN) 300 MG capsule TAKE 2 CAPSULES IN THE MORNING, 1 CAPSULE IN THE AFTERNOON AND 2 CAPSULES IN THE EVENING    ipratropium-albuterol (DUONEB) 0.5-2.5 (3) MG/3ML SOLN INHALE 1 VIAL VIA NEBULIZER BY MOUTH 3 TIMES A DAY AS NEEDED FOR COPD/ASTHMA    levothyroxine (SYNTHROID) 125 MCG tablet TAKE 1 TABLET BY MOUTH EVERY DAY    meclizine (ANTIVERT) 25 MG tablet Take 1 tablet (25 mg total) by mouth 3 (three) times daily as needed for dizziness.    MULTIPLE VITAMIN PO Take 1 tablet by mouth daily.     omeprazole (PRILOSEC) 40 MG capsule TAKE 1 CAPSULE BY MOUTH EVERY DAY    ondansetron (ZOFRAN-ODT) 8 MG disintegrating tablet Take by mouth.    OXYGEN Inhale 4 L into the lungs. 10/15/2018: Patient uses 3 to 3 1/2 liters continuously   phenylephrine (NEO-SYNEPHRINE) 1 % nasal spray Place 1 drop into both nostrils every 6 (six) hours as needed for congestion.    sertraline (ZOLOFT) 50 MG tablet Take 1 tablet (50 mg total) by mouth daily.    No facility-administered encounter medications on file as of 09/20/2021.    Patient Active Problem List   Diagnosis Date Noted   Depression, major, single episode, complete remission (HCC) 05/20/2020   Incarcerated hernia of abdominal cavity 08/04/2018   SBO (small bowel obstruction) (HCC) 04/22/2018   Incarcerated hernia 04/14/2018   Aneurysm (HCC) 07/10/2016   Nonspecific abnormal finding 05/11/2015   Allergic rhinitis 05/11/2015   Anxiety 05/11/2015   Bronchitis, chronic (HCC) 05/11/2015  CCF (congestive cardiac failure) (HCC) 05/11/2015   Clinical depression 05/11/2015   DDD (degenerative disc disease), 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/11/2015   Essential (primary) hypertension 05/11/2015   Acid reflux 05/11/2015   HLD (hyperlipidemia) 05/11/2015   Adult hypothyroidism 05/11/2015   Cervical dysplasia, mild 05/11/2015   Neuropathy 05/11/2015   Adiposity 05/11/2015   Obstructive apnea 05/11/2015   Arthritis, degenerative 05/11/2015    Conditions to be  addressed/monitored: CHF, HTN, and COPD; Level of care concerns  Care Plan : General Pharmacy (Adult)  Updates made by Wenda Overland, LCSW since 09/21/2021 12:00 AM     Problem: CHL AMB "PATIENT-SPECIFIC PROBLEM"   Priority: Medium  Note:   CARE PLAN ENTRY (see longitudinal plan of care for additional care plan information)  Current Barriers:  Patient with HTN, COPD, and DM in need of assistance with connection to community resources  Knowledge deficits and need for support, education and care coordination related to community resources support  Limited social support  Clinical Goal(s)  Over the next 90 days, patient will work with care management team member to address concerns related to community resources for in home care   Interventions Continued to assess patient's care coordination needs related to in home care needs and discussed ongoing care management follow up Confirmed that patient's daughter is in the continued process of making patient's bathroom accessible-walk-in shower with seat-widening the door-just about complete Continued use of wheelchair discussed due to minimal stamina Collaboration with patient's daughter- continues to report limited social support and are in need for resources Status of Referral to the [facility] Alternatives Program discussed Document faxed to this Child psychotherapist for further review-status pending Patient's daughter continues to assist with anxiety management Caregiver stress acknowledged-status of CAP services to be determined Phone call to [employer] Asst for [employer] to update contact information for patient. VM left as well Positive reinforcement continues to be provided for daughter's care giving efforts   Patient Self Care Activities & Deficits:  Patient is unable to independently navigate community resource options without care coordination support  Acknowledges deficits  and is motivated to resolve concern  Limited social support Self administers medications as prescribed Attends all scheduled provider appointments  Please see past updates related to this goal by clicking on the "Past Updates" button in the selected goal         Follow Up Plan: SW will follow up with patient by phone over the next 14 business days       [place], [place] Clinical Social Worker  [facility] Care [facility] [phone]

## 2021-09-26 ENCOUNTER — Other Ambulatory Visit: Payer: Self-pay

## 2021-09-26 ENCOUNTER — Ambulatory Visit (INDEPENDENT_AMBULATORY_CARE_PROVIDER_SITE_OTHER): Payer: Medicare Other | Admitting: Family Medicine

## 2021-09-26 VITALS — BP 140/71 | HR 57 | Temp 98.3°F | Resp 18 | Ht 65.0 in | Wt 207.0 lb

## 2021-09-26 DIAGNOSIS — I2721 Secondary pulmonary arterial hypertension: Secondary | ICD-10-CM

## 2021-09-26 DIAGNOSIS — E7849 Other hyperlipidemia: Secondary | ICD-10-CM

## 2021-09-26 DIAGNOSIS — G4733 Obstructive sleep apnea (adult) (pediatric): Secondary | ICD-10-CM | POA: Diagnosis not present

## 2021-09-26 DIAGNOSIS — J9612 Chronic respiratory failure with hypercapnia: Secondary | ICD-10-CM

## 2021-09-26 DIAGNOSIS — G629 Polyneuropathy, unspecified: Secondary | ICD-10-CM

## 2021-09-26 DIAGNOSIS — Z23 Encounter for immunization: Secondary | ICD-10-CM

## 2021-09-26 DIAGNOSIS — Z9981 Dependence on supplemental oxygen: Secondary | ICD-10-CM

## 2021-09-26 DIAGNOSIS — E113299 Type 2 diabetes mellitus with mild nonproliferative diabetic retinopathy without macular edema, unspecified eye: Secondary | ICD-10-CM | POA: Diagnosis not present

## 2021-09-26 DIAGNOSIS — I729 Aneurysm of unspecified site: Secondary | ICD-10-CM | POA: Diagnosis not present

## 2021-09-26 DIAGNOSIS — I10 Essential (primary) hypertension: Secondary | ICD-10-CM

## 2021-09-26 DIAGNOSIS — J9611 Chronic respiratory failure with hypoxia: Secondary | ICD-10-CM

## 2021-09-26 MED ORDER — ENALAPRIL MALEATE 20 MG PO TABS: 20.0000 mg | ORAL_TABLET | Freq: Two times a day (BID) | ORAL | 1 refills | Status: AC

## 2021-09-26 NOTE — Progress Notes (Signed)
Established patient visit   Patient: Kristen Schroeder   DOB: Oct 15, 1941   80 y.o. Female  MRN: [id number] Visit Date: 09/26/2021  Today's healthcare provider: Megan Mans, MD   Chief Complaint  Patient presents with   Hypertension   Foot Pain    Bilateral   Subjective    HPI Comes in today for routine follow-up.  She is fairly stable.  Not in today by her daughter. Husband is in failing health and this is an issue at home.  Care for each other.  Patient herself is on home O2 24/7. Patient's blood pressures have been running little bit higher lately.  Is been feeling fine. Complains of her feet always feeling cold.  Sometimes a tingling feeling in her feet.  No lower extremity edema.  No weakness but she does notice tingling HPI     Foot Pain    Additional comments: Bilateral      Last edited by Marlene Lard, CMA on 09/26/2021  1:58 PM.      Hypertension, follow-up  BP Readings from Last 3 Encounters:  09/26/21 140/71  09/19/21 (!) 173/77  06/27/21 113/73   Wt Readings from Last 3 Encounters:  09/26/21 207 lb (93.9 kg)  09/19/21 204 lb (92.5 kg)  06/27/21 200 lb (90.7 kg)     She was last seen for hypertension 3 months ago.  BP at that visit was 113/73. Management since that visit includes; Good control.  On Lasix isosorbide enalapril. She reports good compliance with treatment. She is not having side effects. none She is not exercising. She is not adherent to low salt diet.   Outside blood pressures are checks occasionally.  She does not smoke.  Use of agents associated with hypertension: none.    Patient states her blood pressure has been elevated lately. --------------------------------------------------------------------------------------------------- Follow up for Neuropathy:   Changes made at last visit include; on gabapentin.  She reports good compliance with treatment. She feels that condition is Worse. She is not having side  effects. none  -----------------------------------------------------------------------------------------     Medications: Outpatient Medications Prior to Visit  Medication Sig   acetaminophen (TYLENOL) 500 MG tablet Take 500 mg by mouth every 6 (six) hours as needed.   aspirin EC 81 MG tablet Take 81 mg by mouth daily.   Budeson-Glycopyrrol-Formoterol (BREZTRI AEROSPHERE) 160-9-4.8 MCG/ACT AERO Inhale 2 puffs into the lungs 2 (two) times daily.   cetirizine (ZYRTEC) 10 MG tablet TAKE 1 TABLET BY MOUTH EVERY DAY   furosemide (LASIX) 20 MG tablet TAKE 2 TABLETS (40MG  TOTAL) BY MOUTH EVERY DAY   gabapentin (NEURONTIN) 300 MG capsule TAKE 2 CAPSULES IN THE MORNING, 1 CAPSULE IN THE AFTERNOON AND 2 CAPSULES IN THE EVENING   ipratropium-albuterol (DUONEB) 0.5-2.5 (3) MG/3ML SOLN INHALE 1 VIAL VIA NEBULIZER BY MOUTH 3 TIMES A DAY AS NEEDED FOR COPD/ASTHMA   levothyroxine (SYNTHROID) 125 MCG tablet TAKE 1 TABLET BY MOUTH EVERY DAY   meclizine (ANTIVERT) 25 MG tablet Take 1 tablet (25 mg total) by mouth 3 (three) times daily as needed for dizziness.   MULTIPLE VITAMIN PO Take 1 tablet by mouth daily.    omeprazole (PRILOSEC) 40 MG capsule TAKE 1 CAPSULE BY MOUTH EVERY DAY   ondansetron (ZOFRAN-ODT) 8 MG disintegrating tablet Take by mouth.   OXYGEN Inhale 4 L into the lungs.   phenylephrine (NEO-SYNEPHRINE) 1 % nasal spray Place 1 drop into both nostrils every 6 (six) hours as needed for congestion.  sertraline (ZOLOFT) 50 MG tablet Take 1 tablet (50 mg total) by mouth daily.   [DISCONTINUED] enalapril (VASOTEC) 20 MG tablet TAKE 1 TABLET BY MOUTH EVERY DAY   No facility-administered medications prior to visit.    Review of Systems  Constitutional:  Negative for appetite change, chills, fatigue and fever.  Respiratory:  Negative for chest tightness and shortness of breath.   Cardiovascular:  Negative for chest pain and palpitations.  Gastrointestinal:  Negative for abdominal pain, nausea  and vomiting.  Neurological:  Negative for dizziness and weakness.      Objective    BP 140/71 (BP Location: Left Arm, Patient Position: Sitting, Cuff Size: Large)   Pulse (!) 57   Temp 98.3 F (36.8 C) (Temporal)   Resp 18   Ht 5\' 5"  (1.651 m)   Wt 207 lb (93.9 kg)   SpO2 93%   BMI 34.45 kg/m  {Show previous vital signs (optional):23777}  Physical Exam Vitals and nursing note reviewed.  Constitutional:      General: She is not in acute distress.    Appearance: Normal appearance. She is not toxic-appearing.  HENT:     Head: Normocephalic and atraumatic.     Right Ear: External ear normal.     Left Ear: External ear normal.     Nose: Nose normal.  Eyes:     General: No scleral icterus.    Conjunctiva/sclera: Conjunctivae normal.  Cardiovascular:     Rate and Rhythm: Normal rate and regular rhythm.     Pulses: Normal pulses.     Heart sounds: Normal heart sounds.  Pulmonary:     Effort: Pulmonary effort is normal.     Breath sounds: Normal breath sounds.  Abdominal:     Palpations: Abdomen is soft.     Tenderness: There is no abdominal tenderness.  Musculoskeletal:     Right lower leg: No edema.     Left lower leg: No edema.     Comments: Trace lower extremities ankle and pedal edema  Skin:    General: Skin is warm and dry.  Neurological:     General: No focal deficit present.     Mental Status: She is alert and oriented to person, place, and time.     Comments: Good pulses in her feet and sensory exam of the feet is normal .  Psychiatric:        Mood and Affect: Mood normal.        Behavior: Behavior normal.        Thought Content: Thought content normal.        Judgment: Judgment normal.      No results found for any visits on 09/26/21.  Assessment & Plan     1. Essential (primary) hypertension Recent Elavil dose from 20-40 - enalapril (VASOTEC) 20 MG tablet; Take 1 tablet (20 mg total) by mouth 2 (two) times daily.  Dispense: 180 tablet; Refill:  1  2. Need for influenza vaccination  - Flu Vaccine QUAD High Dose(Fluad)  3. Neuropathy Has relief with gabapentin which she is already taking.  No changes.  I have reassured the patient about the symptoms  4. Oxygen dependent Wearing oxygen and in wheelchair during today's visit  5. Chronic respiratory failure with hypoxia and hypercapnia (HCC) Medically stable, followed by pulmonology  6. Obstructive apnea   7. Type 2 diabetes mellitus with mild nonproliferative retinopathy without macular edema, without long-term current use of insulin, unspecified laterality (HCC)   8. Pulmonary artery  hypertension (HCC)   9. Aneurysm (HCC)   10. Other hyperlipidemia    Return in about 4 months (around 01/24/2022).      I, Megan Mans, MD, have reviewed all documentation for this visit. The documentation on 09/28/21 for the exam, diagnosis, procedures, and orders are all accurate and complete.    Linsy Ehresman Wendelyn Breslow, MD  [facility] [phone] (phone) [phone] (fax)  [facility]

## 2021-10-02 DIAGNOSIS — J449 Chronic obstructive pulmonary disease, unspecified: Secondary | ICD-10-CM | POA: Diagnosis not present

## 2021-10-03 ENCOUNTER — Other Ambulatory Visit: Payer: Self-pay

## 2021-10-03 ENCOUNTER — Ambulatory Visit: Payer: Medicare Other | Admitting: Internal Medicine

## 2021-10-03 ENCOUNTER — Emergency Department: Payer: Medicare Other

## 2021-10-03 ENCOUNTER — Emergency Department
Admission: EM | Admit: 2021-10-03 | Discharge: 2021-10-03 | Disposition: A | Discharge status: Home / Self Care | Payer: Medicare Other | Attending: Emergency Medicine | Admitting: Emergency Medicine

## 2021-10-03 DIAGNOSIS — Z7982 Long term (current) use of aspirin: Secondary | ICD-10-CM | POA: Insufficient documentation

## 2021-10-03 DIAGNOSIS — R000 Tachycardia, unspecified: Secondary | ICD-10-CM | POA: Diagnosis not present

## 2021-10-03 DIAGNOSIS — I7789 Other specified disorders of arteries and arterioles: Secondary | ICD-10-CM | POA: Diagnosis not present

## 2021-10-03 DIAGNOSIS — E1140 Type 2 diabetes mellitus with diabetic neuropathy, unspecified: Secondary | ICD-10-CM | POA: Diagnosis not present

## 2021-10-03 DIAGNOSIS — Z79899 Other long term (current) drug therapy: Secondary | ICD-10-CM | POA: Insufficient documentation

## 2021-10-03 DIAGNOSIS — I700 Atherosclerosis of aorta: Secondary | ICD-10-CM | POA: Diagnosis not present

## 2021-10-03 DIAGNOSIS — H9313 Tinnitus, bilateral: Secondary | ICD-10-CM | POA: Insufficient documentation

## 2021-10-03 DIAGNOSIS — I509 Heart failure, unspecified: Secondary | ICD-10-CM | POA: Insufficient documentation

## 2021-10-03 DIAGNOSIS — R918 Other nonspecific abnormal finding of lung field: Secondary | ICD-10-CM | POA: Diagnosis not present

## 2021-10-03 DIAGNOSIS — I110 Hypertensive heart disease with heart failure: Secondary | ICD-10-CM | POA: Insufficient documentation

## 2021-10-03 DIAGNOSIS — E039 Hypothyroidism, unspecified: Secondary | ICD-10-CM | POA: Insufficient documentation

## 2021-10-03 DIAGNOSIS — J449 Chronic obstructive pulmonary disease, unspecified: Secondary | ICD-10-CM | POA: Insufficient documentation

## 2021-10-03 DIAGNOSIS — Z87891 Personal history of nicotine dependence: Secondary | ICD-10-CM | POA: Diagnosis not present

## 2021-10-03 LAB — CBC
HCT: 32.2 % — ABNORMAL LOW (ref 36.0–46.0)
Hemoglobin: 9.1 g/dL — ABNORMAL LOW (ref 12.0–15.0)
MCH: 29 pg (ref 26.0–34.0)
MCHC: 28.3 g/dL — ABNORMAL LOW (ref 30.0–36.0)
MCV: 102.5 fL — ABNORMAL HIGH (ref 80.0–100.0)
Platelets: 185 10*3/uL (ref 150–400)
RBC: 3.14 MIL/uL — ABNORMAL LOW (ref 3.87–5.11)
RDW: 14 % (ref 11.5–15.5)
WBC: 9.6 10*3/uL (ref 4.0–10.5)
nRBC: 0 % (ref 0.0–0.2)

## 2021-10-03 LAB — BASIC METABOLIC PANEL
Anion gap: 3 — ABNORMAL LOW (ref 5–15)
BUN: 16 mg/dL (ref 8–23)
CO2: 41 mmol/L — ABNORMAL HIGH (ref 22–32)
Calcium: 8.4 mg/dL — ABNORMAL LOW (ref 8.9–10.3)
Chloride: 97 mmol/L — ABNORMAL LOW (ref 98–111)
Creatinine, Ser: 0.46 mg/dL (ref 0.44–1.00)
GFR, Estimated: >60 mL/min (ref >60)
Glucose, Bld: 143 mg/dL — ABNORMAL HIGH (ref 70–99)
Potassium: 4.5 mmol/L (ref 3.5–5.1)
Sodium: 141 mmol/L (ref 135–145)

## 2021-10-03 LAB — TROPONIN I (HIGH SENSITIVITY): Troponin I (High Sensitivity): 11 ng/L (ref <18)

## 2021-10-03 NOTE — ED Provider Notes (Signed)
[facility] Emergency Department Provider Note   ____________________________________________   Event Date/Time   First MD Initiated Contact with Patient 10/03/21 1952     (approximate)  I have reviewed the triage vital signs and the nursing notes.   HISTORY  Chief Complaint Tinnitus  HPI Kristen Schroeder is a 80 y.o. female who presents for "hearing my heartbeat in my ears"  LOCATION: Bilateral ears DURATION: 1 day prior to arrival TIMING: Lasted approximately 2 hours and resolved spontaneously SEVERITY: Mild QUALITY: "Thumping" CONTEXT: Patient states that over the past few years she has had recurrence of feeling like she can hear her heartbeat in her ears occasionally.  Patient states that the symptoms stopped for a prolonged period of time until her albuterol medication was changed and she began having the symptoms once again.  Patient states that when she takes her heart rate she noted it to be in the 120s-130s while the sensation is in effect.  Patient states that it lasts approximately 2 hours or longer but resolves spontaneously MODIFYING FACTORS: Worsened with albuterol use and denies any relieving factors ASSOCIATED SYMPTOMS: Denies   Per medical record review, patient has history of COPD on 2-3 L nasal cannula chronically          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   Incarcerated hernia of abdominal cavity 08/04/2018   SBO (small bowel obstruction) (HCC) 04/22/2018   Incarcerated  hernia 04/14/2018   Aneurysm (HCC) 07/10/2016   Nonspecific abnormal finding 05/11/2015   Allergic rhinitis 05/11/2015   Anxiety 05/11/2015   Bronchitis, chronic (HCC) 05/11/2015   CCF (congestive cardiac failure) (HCC) 05/11/2015   Clinical depression 05/11/2015   DDD (degenerative disc disease), 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/11/2015   Essential (primary) hypertension 05/11/2015   Acid reflux 05/11/2015   HLD (hyperlipidemia) 05/11/2015   Adult hypothyroidism 05/11/2015   Cervical dysplasia, mild 05/11/2015   Neuropathy 05/11/2015   Adiposity 05/11/2015   Obstructive apnea 05/11/2015   Arthritis, degenerative 05/11/2015    Past Surgical History:  Procedure Laterality Date   ABDOMINAL HYSTERECTOMY     BOWEL RESECTION N/A 04/14/2018   Procedure: SMALL BOWEL RESECTION;  Surgeon: Leafy Ro, MD;  Location: [facility] ORS;  Service: General;  Laterality: N/A;   BREAST BIOPSY Right    benign   CATARACT EXTRACTION W/PHACO Left 07/03/2016   Procedure: CATARACT EXTRACTION PHACO AND INTRAOCULAR LENS PLACEMENT (IOC);  Surgeon: Galen Manila, MD;  Location: [facility] ORS;  Service: Ophthalmology;  Laterality: Left;  Lot: [id number] H [place]: 00:40.1 AP%: 17.4 CDE:6.94   HERNIA REPAIR     LAPAROTOMY N/A 09/04/2018   Procedure: EXPLORATORY LAPAROTOMY;  Surgeon: Henrene Dodge, MD;  Location: [facility] ORS;  Service: General;  Laterality: N/A;   TUBAL LIGATION     VENTRAL HERNIA REPAIR N/A 04/14/2018   Procedure: HERNIA REPAIR VENTRAL ADULT;  Surgeon: Leafy Ro, MD;  Location: [facility] ORS;  Service: General;  Laterality: N/A;  Prior to Admission medications   Medication Sig Start Date End Date Taking? Authorizing Provider  acetaminophen (TYLENOL) 500 MG tablet Take 500 mg by mouth every 6 (six) hours as needed.    [provider]  aspirin EC 81 MG tablet Take 81 mg by mouth daily.    [provider]  Budeson-Glycopyrrol-Formoterol (BREZTRI AEROSPHERE) 160-9-4.8 MCG/ACT AERO Inhale 2 puffs into the  lungs 2 (two) times daily. 09/19/21   Sallyanne Kuster, NP  cetirizine (ZYRTEC) 10 MG tablet TAKE 1 TABLET BY MOUTH EVERY DAY 03/31/21   Maple Hudson., MD  enalapril (VASOTEC) 20 MG tablet Take 1 tablet (20 mg total) by mouth 2 (two) times daily. 09/26/21   Maple Hudson., MD  furosemide (LASIX) 20 MG tablet TAKE 2 TABLETS (40MG  TOTAL) BY MOUTH EVERY DAY 06/05/21   08/05/21., MD  gabapentin (NEURONTIN) 300 MG capsule TAKE 2 CAPSULES IN THE MORNING, 1 CAPSULE IN THE AFTERNOON AND 2 CAPSULES IN THE EVENING 02/14/21   04/16/21., MD  ipratropium-albuterol (DUONEB) 0.5-2.5 (3) MG/3ML SOLN INHALE 1 VIAL VIA NEBULIZER BY MOUTH 3 TIMES A DAY AS NEEDED FOR COPD/ASTHMA 08/28/21   08/30/21, MD  levothyroxine (SYNTHROID) 125 MCG tablet TAKE 1 TABLET BY MOUTH EVERY DAY 06/28/21   06/30/21., MD  meclizine (ANTIVERT) 25 MG tablet Take 1 tablet (25 mg total) by mouth 3 (three) times daily as needed for dizziness. 06/27/21   06/29/21., MD  MULTIPLE VITAMIN PO Take 1 tablet by mouth daily.     [provider]  omeprazole (PRILOSEC) 40 MG capsule TAKE 1 CAPSULE BY MOUTH EVERY DAY 07/06/21   09/05/21., MD  ondansetron (ZOFRAN-ODT) 8 MG disintegrating tablet Take by mouth. 06/14/21   [provider]  OXYGEN Inhale 4 L into the lungs.    [provider]  phenylephrine (NEO-SYNEPHRINE) 1 % nasal spray Place 1 drop into both nostrils every 6 (six) hours as needed for congestion.    [provider]  sertraline (ZOLOFT) 50 MG tablet Take 1 tablet (50 mg total) by mouth daily. 09/19/21   09/21/21, NP    Allergies Codeine  Family History  Problem Relation Age of Onset   Heart disease Mother    Drug abuse Other    Hypertension Other     Social History Social History   Tobacco Use   Smoking status: Former    Packs/day: 0.50    Years: 15.00    Pack years: 7.50    Types: Cigarettes    Smokeless tobacco: Never   Tobacco comments:    30-40 years ago  Vaping Use   Vaping Use: Never used  Substance Use Topics   Alcohol use: No    Alcohol/week: 0.0 standard drinks   Drug use: No    Review of Systems Constitutional: No fever/chills Eyes: No visual changes. ENT: No sore throat.  Endorses intermittent pulsatile tinnitus Cardiovascular: Denies chest pain. Respiratory: Denies shortness of breath. Gastrointestinal: No abdominal pain.  No nausea, no vomiting.  No diarrhea. Genitourinary: Negative for dysuria. Musculoskeletal: Negative for acute arthralgias Skin: Negative for rash. Neurological: Negative for headaches, weakness/numbness/paresthesias in any extremity Psychiatric: Negative for suicidal ideation/homicidal ideation   ____________________________________________   PHYSICAL EXAM:  VITAL SIGNS: ED Triage Vitals  Enc Vitals Group     BP 10/03/21 1228 (!) 147/73     Pulse Rate 10/03/21 1228 (!) 101  Resp 10/03/21 1228 20     Temp 10/03/21 1228 98.8 F (37.1 C)     Temp Source 10/03/21 1228 Oral     SpO2 10/03/21 1228 96 %     Weight 10/03/21 1229 207 lb 3.7 oz (94 kg)     Height 10/03/21 1229 5\' 5"  (1.651 m)     Head Circumference --      Peak Flow --      Pain Score 10/03/21 1229 0     Pain Loc --      Pain Edu? --      Excl. in GC? --    Constitutional: Alert and oriented. Well appearing and in no acute distress. Eyes: Conjunctivae are normal. PERRL. Head: Atraumatic. Nose: No congestion/rhinnorhea. Mouth/Throat: Mucous membranes are moist. Neck: No stridor Cardiovascular: Grossly normal heart sounds.  Good peripheral circulation. Respiratory: Normal respiratory effort.  No retractions. Gastrointestinal: Soft and nontender. No distention. Musculoskeletal: No obvious deformities Neurologic:  Normal speech and language. No gross focal neurologic deficits are appreciated. Skin:  Skin is warm and dry. No rash noted. Psychiatric: Mood and  affect are normal. Speech and behavior are normal.  ____________________________________________   LABS (all labs ordered are listed, but only abnormal results are displayed)  Labs Reviewed  BASIC METABOLIC PANEL - Abnormal; Notable for the following components:      Result Value   Chloride 97 (*)    CO2 41 (*)    Glucose, Bld 143 (*)    Calcium 8.4 (*)    Anion gap 3 (*)    All other components within normal limits  CBC - Abnormal; Notable for the following components:   RBC 3.14 (*)    Hemoglobin 9.1 (*)    HCT 32.2 (*)    MCV 102.5 (*)    MCHC 28.3 (*)    All other components within normal limits  TROPONIN I (HIGH SENSITIVITY)  TROPONIN I (HIGH SENSITIVITY)   ____________________________________________  EKG  ED ECG REPORT I, 10/05/21, the attending physician, personally viewed and interpreted this ECG.  Date: 10/03/2021 EKG Time: 1236 Rate: 95 Rhythm: normal sinus rhythm QRS Axis: normal Intervals: normal ST/T Wave abnormalities: normal Narrative Interpretation: no evidence of acute ischemia  ____________________________________________  RADIOLOGY  ED MD interpretation: 2 view chest x-ray shows no evidence of acute abnormalities including no pneumonia, pneumothorax, or widened mediastinum.  There is evidence of chronic COPD as well as bibasilar scarring and signs of pulmonary arterial hypertension  Official radiology report(s): DG Chest 2 View  Result Date: 10/03/2021 CLINICAL DATA:  Increased heart rate. EXAM: CHEST - 2 VIEW COMPARISON:  06/04/2020 FINDINGS: Mild hyperinflation. Midline trachea. Normal heart size. Atherosclerosis in the transverse aorta. Pulmonary artery enlargement again identified. No pleural effusion or pneumothorax. Mild left hemidiaphragm elevation with left greater than right base scarring. IMPRESSION: Hyperinflation, suggesting COPD. Concurrent bibasilar scarring, without acute superimposed process. Pulmonary artery enlargement  suggests pulmonary arterial hypertension. Aortic Atherosclerosis (ICD10-I70.0). Electronically Signed   By: 06/06/2020 M.D.   On: 10/03/2021 13:36    ____________________________________________   PROCEDURES  Procedure(s) performed (including Critical Care):  .1-3 Lead EKG Interpretation Performed by: 10/05/2021, MD Authorized by: Merwyn Katos, MD     Interpretation: normal     ECG rate:  83   ECG rate assessment: normal     Rhythm: sinus rhythm     Ectopy: none     Conduction: normal     ____________________________________________   INITIAL IMPRESSION /  ASSESSMENT AND PLAN / ED COURSE  As part of my medical decision making, I reviewed the following data within the electronic medical record, if available:  Nursing notes reviewed and incorporated, Labs reviewed, EKG interpreted, Old chart reviewed, Radiograph reviewed and Notes from prior ED visits reviewed and incorporated      80 year old female presents with pulsatile tinnitus. EKG: No STEMI and no evidence of Brugadas sign, delta wave, epsilon wave, significantly prolonged QTc, or malignant arrhythmia. Based on H&P and testing, this patient appears to be low risk for emergent causes of pulsatile tinnitus such as, but not limited to, a malignant cardiac arrhythmia, ACS, pulmonary embolism, thyrotoxicosis, PNA, PTX.  The patient has been given strict return precautions and understands the need for further outpatient testing and treatment. Dispo: Discharge home     ____________________________________________   FINAL CLINICAL IMPRESSION(S) / ED DIAGNOSES  Final diagnoses:  Sinus tachycardia  Tinnitus of both ears     ED Discharge Orders     None        Note:  This document was prepared using Dragon voice recognition software and may include unintentional dictation errors.    Merwyn Katos, MD 10/03/21 [phone]

## 2021-10-03 NOTE — ED Provider Notes (Signed)
  Emergency Medicine Provider Triage Evaluation Note  Kristen Schroeder , a 80 y.o.female,  was evaluated in triage.  Pt complains of increased heart rate.  Patient states that she is overall been feeling well, but has been noticing some tinnitus and pulsing sensation in her ears.  She states that her heart rate is over 100 right now it is not usually that high.  Denies chest pain, shortness of breath, abdominal pain, nausea/vomiting.   Review of Systems  Positive: Tinnitus, palpitations Negative: Denies fever, chest pain, vomiting  Physical Exam   Vitals:   10/03/21 1228  BP: (!) 147/73  Pulse: (!) 101  Resp: 20  Temp: 98.8 F (37.1 C)  SpO2: 96%   Gen:   Awake, no distress   Resp:  Normal effort  MSK:   Moves extremities without difficulty  Other:    Medical Decision Making  Given the patient's initial medical screening exam, the following diagnostic evaluation has been ordered. The patient will be placed in the appropriate treatment space, once one is available, to complete the evaluation and treatment. I have discussed the plan of care with the patient and I have advised the patient that an ED physician or mid-level practitioner will reevaluate their condition after the test results have been received, as the results may give them additional insight into the type of treatment they may need.    Diagnostics: Labs, CXR, EKG, troponin  Treatments: none immediately   Varney Daily, [place] 10/03/21 1232    Delton Prairie, MD 10/03/21 1310

## 2021-10-03 NOTE — ED Triage Notes (Signed)
Pt reports she started hearing her pulse in her ears last night and checked her pulse and it was 107 so she decided to come in . Hx of tinnitus. Wears 2 l [place] chronic.  Orders for cardiac work up [employer]

## 2021-10-04 ENCOUNTER — Ambulatory Visit: Payer: Medicare Other | Admitting: *Deleted

## 2021-10-04 DIAGNOSIS — I10 Essential (primary) hypertension: Secondary | ICD-10-CM

## 2021-10-04 DIAGNOSIS — J9612 Chronic respiratory failure with hypercapnia: Secondary | ICD-10-CM

## 2021-10-04 DIAGNOSIS — G629 Polyneuropathy, unspecified: Secondary | ICD-10-CM

## 2021-10-04 DIAGNOSIS — J9611 Chronic respiratory failure with hypoxia: Secondary | ICD-10-CM

## 2021-10-04 NOTE — Patient Instructions (Signed)
Visit Information  Thank you for taking time to visit with me today. Please don't hesitate to contact me if I can be of assistance to you before our next scheduled telephone appointment.  Following are the goals we discussed today:   - begin a notebook of services in my neighborhood or community - follow-up on any referrals for help I am given - think ahead to make sure my need does not become an emergency - have a back-up plan - make a list of family or friends that I can call  -continue follow up with CAP(Community Alternatives Referral)  Our next appointment is by telephone on 10/18/21 at 10am  Please call the care guide team at [phone] if you need to cancel or reschedule your appointment.   If you are experiencing a Mental Health or Behavioral Health Crisis or need someone to talk to, please call the Suicide and Crisis Lifeline: 27   Following is a copy of your full plan of care:  Care Plan : [facility] (Adult)  Updates made by Wenda Overland, LCSW since 10/04/2021 12:00 AM     Problem: [facility] AMB "PATIENT-SPECIFIC PROBLEM"   Priority: Medium  Note:   CARE PLAN ENTRY (see longitudinal plan of care for additional care plan information)  Current Barriers:  Patient with HTN, COPD, and DM in need of assistance with connection to community resources  Knowledge deficits and need for support, education and care coordination related to community resources support  Limited social support  Clinical Goal(s)  Over the next 90 days, patient will work with care management team member to address concerns related to community resources for in home care   Interventions ED for panic attacks, xanax daily instead of prn, urgent care instead of emergency room, deep breathing excercises recommended, therapist, daily excercise Continued to assess patient's care coordination needs related to in home care needs and discussed ongoing care management follow up Confirmed that patient's  daughter continues to provide care until alternative arrangements are made Continued use of wheelchair and rollator walker discussed due to minimal stamina Daughter confirmed patient's continued challenge managing stress and anxiety Self management skills discussed including medication adherence, considering mental health therapy, increasing exercise/social activities and maintaining a healthy diet Status of Referral to the [facility] Alternatives Program discussed-Cori Hodgin Tour manager for [employer] for status update [phone]. She will follow up with this social worker once update is received Caregiver stress acknowledged-Positive reinforcement continues to be provided for daughter's care giving efforts   Patient Self Care Activities & Deficits:  Patient is unable to independently navigate community resource options without care coordination support  Acknowledges deficits and is motivated to resolve concern  Limited social support Self administers medications as prescribed Attends all scheduled provider appointments  Please see past updates related to this goal by clicking on the "Past Updates" button in the selected goal        Ms. Frei was given information about Care Management services by the embedded care coordination team including:  Care Management services include personalized support from designated clinical staff supervised by her physician, including individualized plan of care and coordination with other care providers 24/7 contact phone numbers for assistance for urgent and routine care needs. The patient may stop CCM services at any time (effective at the end of the month) by phone call to the office staff.  Patient agreed to services and verbal consent obtained.   The patient verbalized understanding of  instructions, educational materials, and care plan provided today and declined offer to receive copy  of patient instructions, educational materials, and care plan.   Telephone follow up appointment with care management team member scheduled for:10/18/21   Verna Czech, LCSW Clinical Social Worker  [facility] Care Management [phone]

## 2021-10-04 NOTE — Chronic Care Management (AMB) (Signed)
Chronic Care Management    Clinical Social Work Note  10/04/2021 Name: Kristen Schroeder MRN: [id number] DOB: [birth year]  Kristen Schroeder is a 80 y.o. year old female who is a primary care patient of Maple Hudson., MD. The CCM team was consulted to assist the patient with chronic disease management and/or care coordination needs related to: [employer] .   Collaboration with patient's daughter and Department of Human Services  for follow up visit in response to provider referral for social work chronic care management and care coordination services.   Consent to Services:  The patient was given information about Chronic Care Management services, agreed to services, and gave verbal consent prior to initiation of services.  Please see initial visit note for detailed documentation.   Patient agreed to services and consent obtained.   Assessment: Review of patient past medical history, allergies, medications, and health status, including review of relevant consultants reports was performed today as part of a comprehensive evaluation and provision of chronic care management and care coordination services.     SDOH (Social Determinants of Health) assessments and interventions performed:    Advanced Directives Status: Not addressed in this encounter.  CCM Care Plan  Allergies  Allergen Reactions   Codeine Nausea And Vomiting and Nausea Only    Outpatient Encounter Medications as of 10/04/2021  Medication Sig Note   acetaminophen (TYLENOL) 500 MG tablet Take 500 mg by mouth every 6 (six) hours as needed.    aspirin EC 81 MG tablet Take 81 mg by mouth daily.    Budeson-Glycopyrrol-Formoterol (BREZTRI AEROSPHERE) 160-9-4.8 MCG/ACT AERO Inhale 2 puffs into the lungs 2 (two) times daily.    cetirizine (ZYRTEC) 10 MG tablet TAKE 1 TABLET BY MOUTH EVERY DAY    enalapril (VASOTEC) 20 MG tablet Take 1 tablet (20 mg total) by mouth 2 (two) times daily.    furosemide (LASIX) 20 MG  tablet TAKE 2 TABLETS (40MG  TOTAL) BY MOUTH EVERY DAY    gabapentin (NEURONTIN) 300 MG capsule TAKE 2 CAPSULES IN THE MORNING, 1 CAPSULE IN THE AFTERNOON AND 2 CAPSULES IN THE EVENING    ipratropium-albuterol (DUONEB) 0.5-2.5 (3) MG/3ML SOLN INHALE 1 VIAL VIA NEBULIZER BY MOUTH 3 TIMES A DAY AS NEEDED FOR COPD/ASTHMA    levothyroxine (SYNTHROID) 125 MCG tablet TAKE 1 TABLET BY MOUTH EVERY DAY    meclizine (ANTIVERT) 25 MG tablet Take 1 tablet (25 mg total) by mouth 3 (three) times daily as needed for dizziness.    MULTIPLE VITAMIN PO Take 1 tablet by mouth daily.     omeprazole (PRILOSEC) 40 MG capsule TAKE 1 CAPSULE BY MOUTH EVERY DAY    ondansetron (ZOFRAN-ODT) 8 MG disintegrating tablet Take by mouth.    OXYGEN Inhale 4 L into the lungs. 10/15/2018: Patient uses 3 to 3 1/2 liters continuously   phenylephrine (NEO-SYNEPHRINE) 1 % nasal spray Place 1 drop into both nostrils every 6 (six) hours as needed for congestion.    sertraline (ZOLOFT) 50 MG tablet Take 1 tablet (50 mg total) by mouth daily.    No facility-administered encounter medications on file as of 10/04/2021.    Patient Active Problem List   Diagnosis Date Noted   Depression, major, single episode, complete remission (HCC) 05/20/2020   Incarcerated hernia of abdominal cavity 08/04/2018   SBO (small bowel obstruction) (HCC) 04/22/2018   Incarcerated hernia 04/14/2018   Aneurysm (HCC) 07/10/2016   Nonspecific abnormal finding 05/11/2015   Allergic rhinitis 05/11/2015   Anxiety  05/11/2015   Bronchitis, chronic (HCC) 05/11/2015   CCF (congestive cardiac failure) (HCC) 05/11/2015   Clinical depression 05/11/2015   DDD (degenerative disc disease), 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/11/2015   Essential (primary) hypertension 05/11/2015   Acid reflux 05/11/2015   HLD (hyperlipidemia) 05/11/2015   Adult hypothyroidism 05/11/2015   Cervical dysplasia, mild 05/11/2015   Neuropathy 05/11/2015   Adiposity 05/11/2015   Obstructive apnea 05/11/2015    Arthritis, degenerative 05/11/2015    Conditions to be addressed/monitored:  CHF, HTN, and COPD; Level of care concerns;   Care Plan : [facility] (Adult)  Updates made by Wenda Overland, LCSW since 10/04/2021 12:00 AM     Problem: [facility]L AMB "PATIENT-SPECIFIC PROBLEM"   Priority: Medium  Note:   CARE PLAN ENTRY (see longitudinal plan of care for additional care plan information)  Current Barriers:  Patient with HTN, COPD, and DM in need of assistance with connection to community resources  Knowledge deficits and need for support, education and care coordination related to community resources support  Limited social support  Clinical Goal(s)  Over the next 90 days, patient will work with care management team member to address concerns related to community resources for in home care   Interventions ED for panic attacks, xanax daily instead of prn, urgent care instead of emergency room, deep breathing excercises recommended, therapist, daily excercise Continued to assess patient's care coordination needs related to in home care needs and discussed ongoing care management follow up Confirmed that patient's daughter continues to provide care until alternative arrangements are made Continued use of wheelchair and rollator walker discussed due to minimal stamina Daughter confirmed patient's continued challenge managing stress and anxiety Self management skills discussed including medication adherence, considering mental health therapy, increasing exercise/social activities and maintaining a healthy diet Status of Referral to the [facility] Alternatives Program discussed-Cori Hodgin Tour manager for [employer] for status update [phone]. She will follow up with this Child psychotherapist once update is received Caregiver stress acknowledged-Positive reinforcement continues to be provided for daughter's care giving  efforts   Patient Self Care Activities & Deficits:  Patient is unable to independently navigate community resource options without care coordination support  Acknowledges deficits and is motivated to resolve concern  Limited social support Self administers medications as prescribed Attends all scheduled provider appointments  Please see past updates related to this goal by clicking on the "Past Updates" button in the selected goal         Follow Up Plan: SW will follow up with patient by phone over the next 14 business days       [employer], [place] Clinical Social Worker  [facility] Care Management [phone]

## 2021-10-05 ENCOUNTER — Ambulatory Visit: Payer: Self-pay | Admitting: *Deleted

## 2021-10-05 ENCOUNTER — Other Ambulatory Visit: Payer: Self-pay | Admitting: Family Medicine

## 2021-10-05 DIAGNOSIS — I10 Essential (primary) hypertension: Secondary | ICD-10-CM

## 2021-10-05 NOTE — Telephone Encounter (Signed)
FYI

## 2021-10-05 NOTE — Telephone Encounter (Signed)
Agree with triage to ED for hypoxia.

## 2021-10-05 NOTE — Telephone Encounter (Signed)
Reason for Disposition  Oxygen level (e.g., pulse oximetry) 91 to 94 percent    O2 is 91% on 4L.   Pt feeling short of breath  Answer Assessment - Initial Assessment Questions 1. RESPIRATORY STATUS: "Describe your breathing?" (e.g., wheezing, shortness of breath, unable to speak, severe coughing)      Pt calling in saying her O2 is 92%.   I went to the hospital.   They told me my BP was up a little bit.   I use Oxygen.    They told me I was ok.  I'm sleeping and I woke up short of breath. 2. ONSET: "When did this breathing problem begin?"      Monday I was a little short of breath.   I got diarrhea on Monday and it resolved on Tuesday.    "I can't think straight".    3. PATTERN "Does the difficult breathing come and go, or has it been constant since it started?"      I sleep with my O2 too.    4. SEVERITY: "How bad is your breathing?" (e.g., mild, moderate, severe)    - MILD: No SOB at rest, mild SOB with walking, speaks normally in sentences, can lie down, no retractions, pulse < 100.    - MODERATE: SOB at rest, SOB with minimal exertion and prefers to sit, cannot lie down flat, speaks in phrases, mild retractions, audible wheezing, pulse 100-120.    - SEVERE: Very SOB at rest, speaks in single words, struggling to breathe, sitting hunched forward, retractions, pulse > 120      The last 2-3 days my breathing has been short.    My O2 is on 4L.   I'm going to have my husband turn it up.    5. RECURRENT SYMPTOM: "Have you had difficulty breathing before?" If Yes, ask: "When was the last time?" and "What happened that time?"      Yes on O2 all the time.  91% on pulse oximeter.   It's normally 96%.  Pulse 102.   No coughing or sore throat or runny nose.   I needed to know about my oxygen level.   I know my level should be in the 90s.   How high or low should my oxygen be?   I instructed her to go to the hospital if her O2 level was 91% and she is short of breath.    She has decided to do a breathing  treatment first. 6. CARDIAC HISTORY: "Do you have any history of heart disease?" (e.g., heart attack, angina, bypass surgery, angioplasty)      Not asked 7. LUNG HISTORY: "Do you have any history of lung disease?"  (e.g., pulmonary embolus, asthma, emphysema)     On O2 all the time. 8. CAUSE: "What do you think is causing the breathing problem?"      I don't know 9. OTHER SYMPTOMS: "Do you have any other symptoms? (e.g., dizziness, runny nose, cough, chest pain, fever)     Denies any other symptoms.   Just short of breath and her pulse ox is 91% on 4L.   She had her husband turn her O2 up but I don't know how high he turned it up.    10. O2 SATURATION MONITOR:  "Do you use an oxygen saturation monitor (pulse oximeter) at home?" If Yes, "What is your reading (oxygen level) today?" "What is your usual oxygen saturation reading?" (e.g., 95%)       Yes  It's 91% and it's normally 96%. 11. PREGNANCY: "Is there any chance you are pregnant?" "When was your last menstrual period?"       N/A 12. TRAVEL: "Have you traveled out of the country in the last month?" (e.g., travel history, exposures)       N/A  Protocols used: Breathing Difficulty-A-AH

## 2021-10-05 NOTE — Telephone Encounter (Addendum)
Pt called in saying she was short of breath and her pulse ox is 91% on 4L O2.   It's normally 96%.  Her pulse is 102.   She had her husband turn up her oxygen but I don't know how much.   I instructed her to go to the ED but she wants to do a breathing treatment first.   I let her know I would pass this information on to her doctor at [facility].  I again told her to go to the ED if her O2 level did not come up after her breathing treatment and she was still short of breath.   She did not say anything one way or the other just said,  "ok".     I sent this information to [facility].  I also called into the office and spoke with Vernona Rieger and let her know what is going on too.   She's going to make sure one of the providers see my notes as I don't think this pt will go to the ED and we both agree she should.

## 2021-10-07 NOTE — Telephone Encounter (Signed)
Request sent too soon. Verified by Catalina Gravel (pharmacist).  Last RF 09/26/21    #180 1 RF Requested Prescriptions  Pending Prescriptions Disp Refills   enalapril (VASOTEC) 20 MG tablet [Pharmacy Med Name: ENALAPRIL MALEATE 20 MG TAB] 90 tablet 1    Sig: TAKE 1 TABLET BY MOUTH EVERY DAY     Cardiovascular:  ACE Inhibitors Failed - 10/05/2021  1:22 AM      Failed - Last BP in normal range    BP Readings from Last 1 Encounters:  10/03/21 (!) 149/64          Passed - Cr in normal range and within 180 days    Creatinine, Ser  Date Value Ref Range Status  10/03/2021 0.46 0.44 - 1.00 mg/dL Final          Passed - K in normal range and within 180 days    Potassium  Date Value Ref Range Status  10/03/2021 4.5 3.5 - 5.1 mmol/L Final          Passed - Patient is not pregnant      Passed - Valid encounter within last 6 months    Recent Outpatient Visits           1 week ago Essential (primary) hypertension   [facility] Maple Hudson., MD   3 months ago Congestive heart failure, unspecified HF chronicity, unspecified heart failure type [facility])   [facility] Maple Hudson., MD   6 months ago Congestive heart failure, unspecified HF chronicity, unspecified heart failure type [facility])   [facility] Maple Hudson., MD   8 months ago Chronic respiratory failure with hypoxia and hypercapnia [facility])   [facility] Maple Hudson., MD   9 months ago Allergic rhinitis, unspecified seasonality, unspecified trigger   [facility] Maple Hudson., MD       Future Appointments             In 1 month Maple Hudson., MD [facility]   In 3 months Maple Hudson., MD [facility]

## 2021-10-08 DIAGNOSIS — R0902 Hypoxemia: Secondary | ICD-10-CM | POA: Diagnosis not present

## 2021-10-08 DIAGNOSIS — I10 Essential (primary) hypertension: Secondary | ICD-10-CM | POA: Diagnosis not present

## 2021-10-09 ENCOUNTER — Emergency Department: Payer: Medicare Other

## 2021-10-09 ENCOUNTER — Other Ambulatory Visit: Payer: Self-pay

## 2021-10-09 ENCOUNTER — Ambulatory Visit: Payer: Self-pay

## 2021-10-09 ENCOUNTER — Emergency Department
Admission: EM | Admit: 2021-10-09 | Discharge: 2021-10-09 | Disposition: A | Discharge status: Home / Self Care | Payer: Medicare Other | Attending: Emergency Medicine | Admitting: Emergency Medicine

## 2021-10-09 DIAGNOSIS — R062 Wheezing: Secondary | ICD-10-CM | POA: Diagnosis not present

## 2021-10-09 DIAGNOSIS — Z87891 Personal history of nicotine dependence: Secondary | ICD-10-CM | POA: Diagnosis not present

## 2021-10-09 DIAGNOSIS — E1140 Type 2 diabetes mellitus with diabetic neuropathy, unspecified: Secondary | ICD-10-CM | POA: Insufficient documentation

## 2021-10-09 DIAGNOSIS — I110 Hypertensive heart disease with heart failure: Secondary | ICD-10-CM | POA: Diagnosis not present

## 2021-10-09 DIAGNOSIS — I509 Heart failure, unspecified: Secondary | ICD-10-CM | POA: Insufficient documentation

## 2021-10-09 DIAGNOSIS — Z20822 Contact with and (suspected) exposure to covid-19: Secondary | ICD-10-CM | POA: Diagnosis not present

## 2021-10-09 DIAGNOSIS — I517 Cardiomegaly: Secondary | ICD-10-CM | POA: Diagnosis not present

## 2021-10-09 DIAGNOSIS — Z743 Need for continuous supervision: Secondary | ICD-10-CM | POA: Diagnosis not present

## 2021-10-09 DIAGNOSIS — E039 Hypothyroidism, unspecified: Secondary | ICD-10-CM | POA: Insufficient documentation

## 2021-10-09 DIAGNOSIS — J441 Chronic obstructive pulmonary disease with (acute) exacerbation: Secondary | ICD-10-CM

## 2021-10-09 DIAGNOSIS — Z7982 Long term (current) use of aspirin: Secondary | ICD-10-CM | POA: Insufficient documentation

## 2021-10-09 DIAGNOSIS — J449 Chronic obstructive pulmonary disease, unspecified: Secondary | ICD-10-CM | POA: Insufficient documentation

## 2021-10-09 DIAGNOSIS — R0602 Shortness of breath: Secondary | ICD-10-CM | POA: Diagnosis not present

## 2021-10-09 DIAGNOSIS — R531 Weakness: Secondary | ICD-10-CM | POA: Diagnosis not present

## 2021-10-09 LAB — CBC
HCT: 36 % (ref 36.0–46.0)
Hemoglobin: 9.8 g/dL — ABNORMAL LOW (ref 12.0–15.0)
MCH: 27.9 pg (ref 26.0–34.0)
MCHC: 27.2 g/dL — ABNORMAL LOW (ref 30.0–36.0)
MCV: 102.6 fL — ABNORMAL HIGH (ref 80.0–100.0)
Platelets: 210 10*3/uL (ref 150–400)
RBC: 3.51 MIL/uL — ABNORMAL LOW (ref 3.87–5.11)
RDW: 13.8 % (ref 11.5–15.5)
WBC: 10.4 10*3/uL (ref 4.0–10.5)
nRBC: 0 % (ref 0.0–0.2)

## 2021-10-09 LAB — COMPREHENSIVE METABOLIC PANEL
ALT: 7 U/L (ref 0–44)
AST: 15 U/L (ref 15–41)
Albumin: 3.5 g/dL (ref 3.5–5.0)
Alkaline Phosphatase: 83 U/L (ref 38–126)
Anion gap: 6 (ref 5–15)
BUN: 10 mg/dL (ref 8–23)
CO2: 43 mmol/L — ABNORMAL HIGH (ref 22–32)
Calcium: 9.2 mg/dL (ref 8.9–10.3)
Chloride: 90 mmol/L — ABNORMAL LOW (ref 98–111)
Creatinine, Ser: 0.43 mg/dL — ABNORMAL LOW (ref 0.44–1.00)
GFR, Estimated: >60 mL/min (ref >60)
Glucose, Bld: 137 mg/dL — ABNORMAL HIGH (ref 70–99)
Potassium: 4.2 mmol/L (ref 3.5–5.1)
Sodium: 139 mmol/L (ref 135–145)
Total Bilirubin: 0.4 mg/dL (ref 0.3–1.2)
Total Protein: 7.1 g/dL (ref 6.5–8.1)

## 2021-10-09 LAB — TROPONIN I (HIGH SENSITIVITY)
Troponin I (High Sensitivity): 9 ng/L (ref <18)
Troponin I (High Sensitivity): 6 ng/L (ref <18)

## 2021-10-09 LAB — RESP PANEL BY RT-PCR (FLU A&B, COVID) ARPGX2
Influenza A by PCR: NEGATIVE
Influenza B by PCR: NEGATIVE
SARS Coronavirus 2 by RT PCR: NEGATIVE

## 2021-10-09 MED ORDER — PREDNISONE 10 MG PO TABS: 10.0000 mg | ORAL_TABLET | Freq: Every day | ORAL | 0 refills | Status: AC

## 2021-10-09 MED ORDER — AZITHROMYCIN 500 MG PO TABS
500.0000 mg | ORAL_TABLET | Freq: Once | ORAL | Status: AC
  Administered 2021-10-09: 500 mg via ORAL
  Filled 2021-10-09: qty 1

## 2021-10-09 MED ORDER — AZITHROMYCIN 250 MG PO TABS: 250.0000 mg | ORAL_TABLET | Freq: Every day | ORAL | 0 refills | Status: AC

## 2021-10-09 MED ORDER — PREDNISONE 20 MG PO TABS
60.0000 mg | ORAL_TABLET | Freq: Once | ORAL | Status: AC
  Administered 2021-10-09: 60 mg via ORAL
  Filled 2021-10-09: qty 3

## 2021-10-09 NOTE — Telephone Encounter (Signed)
Pt called in stating she is SOB, weak, no energy, and no appetite. Pt says this has been going on since last tues 10/03/21. Pt reports she has increased her oxygen d/t the SOB. Also reports hard to inhale deep breath. She says she also has phlegm in her chest. Checked her O2 level and was 93%. She says EMS came out yesterday but she feels the same if not worse. Advised pt she needs to go to ER to be evaluated. Pt says that she doesn't have the energy to get up and go. Daughter can take her but unable to stay with her. Pt states EMS told her if she didn't get any better to call back, advised to call them back or asked if she wanted me to call. She says that I can call. 911 called and dispatched EMS to home. Stayed on the line with pt until they arrived. Pt was able to use inhaler while waiting and was going to use Duoneb neb tx but they arrived. Care advice given and pt verbalized understanding. No other questions/concerns noted.    Reason for Disposition  [1] MODERATE difficulty breathing (e.g., speaks in phrases, SOB even at rest, pulse 100-120) AND [2] NEW-onset or WORSE than normal  Answer Assessment - Initial Assessment Questions 1. RESPIRATORY STATUS: "Describe your breathing?" (e.g., wheezing, shortness of breath, unable to speak, severe coughing)      SOB with activity  2. ONSET: "When did this breathing problem begin?"      10/03/21 3. PATTERN "Does the difficult breathing come and go, or has it been constant since it started?"      Comes and goes 4. SEVERITY: "How bad is your breathing?" (e.g., mild, moderate, severe)    - MILD: No SOB at rest, mild SOB with walking, speaks normally in sentences, can lie down, no retractions, pulse < 100.    - MODERATE: SOB at rest, SOB with minimal exertion and prefers to sit, cannot lie down flat, speaks in phrases, mild retractions, audible wheezing, pulse 100-120.    - SEVERE: Very SOB at rest, speaks in single words, struggling to breathe, sitting  hunched forward, retractions, pulse > 120      Moderate  5. RECURRENT SYMPTOM: "Have you had difficulty breathing before?" If Yes, ask: "When was the last time?" and "What happened that time?"      No 6. CARDIAC HISTORY: "Do you have any history of heart disease?" (e.g., heart attack, angina, bypass surgery, angioplasty)      HTN 7. LUNG HISTORY: "Do you have any history of lung disease?"  (e.g., pulmonary embolus, asthma, emphysema)     COPD 8. CAUSE: "What do you think is causing the breathing problem?"      unsure 9. OTHER SYMPTOMS: "Do you have any other symptoms? (e.g., dizziness, runny nose, cough, chest pain, fever)     Runny nose, cough, congestion 10. O2 SATURATION MONITOR:  "Do you use an oxygen saturation monitor (pulse oximeter) at home?" If Yes, "What is your reading (oxygen level) today?" "What is your usual oxygen saturation reading?" (e.g., 95%)       93% 11. PREGNANCY: "Is there any chance you are pregnant?" "When was your last menstrual period?"       No 12. TRAVEL: "Have you traveled out of the country in the last month?" (e.g., travel history, exposures)       No  Protocols used: Breathing Difficulty-A-AH

## 2021-10-09 NOTE — ED Provider Notes (Signed)
  Emergency Medicine Provider Triage Evaluation Note  Kristen Schroeder , a 80 y.o.female,  was evaluated in triage.  Pt complains of shortness of breath for the past week.  She was seen here recently with discharge home for the same symptoms.  She has a history of COPD and is usually on 4 L of oxygen at home.  She arrived by EMS and received a neb treatment in route.  She states that this helped her shortness of breath.  Denies chest pain, abdominal pain, nausea/vomiting, or urinary symptoms.   Review of Systems  Positive: Shortness of breath Negative: Denies fever, chest pain, vomiting  Physical Exam   Vitals:   10/09/21 1254 10/09/21 1255  BP: (!) 127/59   Pulse: 84   Resp: 20   Temp: 98.4 F (36.9 C)   SpO2:  94%   Gen:   Awake, no distress   Resp:  Normal effort  MSK:   Moves extremities without difficulty  Other:    Medical Decision Making  Given the patient's initial medical screening exam, the following diagnostic evaluation has been ordered. The patient will be placed in the appropriate treatment space, once one is available, to complete the evaluation and treatment. I have discussed the plan of care with the patient and I have advised the patient that an ED physician or mid-level practitioner will reevaluate their condition after the test results have been received, as the results may give them additional insight into the type of treatment they may need.    Diagnostics: Labs, EKG, CXR, respiratory panel.  Treatments: none immediately   Varney Daily, [place] 10/09/21 1302    Jene Every, MD 10/09/21 1308

## 2021-10-09 NOTE — ED Notes (Signed)
Daughter Ondrea Dow called will be her ride if d/c   [phone]

## 2021-10-09 NOTE — ED Triage Notes (Signed)
Pt arrived to ed via [place] [facility]. Ems reports sob x 2 day and generalized weakness. Home breathing treatment not working. Initial oxygen sat is 95%. Pt given neb treatment prior to arrival and pt reported to ems that sob decreased. Speaking in full sentences. A&o per ems  133/50 BP 76 HR 179 cbg

## 2021-10-09 NOTE — ED Triage Notes (Signed)
Pt in with co shob since last week, was seen here for the same last week and discharged home. Pt here for persistent symptoms, no shob noted at this time. Pt is on o2 at 4l at home due to copd.

## 2021-10-09 NOTE — ED Provider Notes (Signed)
[facility] Emergency Department Provider Note  Time seen: 7:04 PM  I have reviewed the triage vital signs and the nursing notes.   HISTORY  Chief Complaint Shortness of Breath  HPI Kristen Schroeder is a 80 y.o. female with a past medical history of COPD, diabetes, hypertension NSTEMI emergency department for shortness of breath.  According to the patient she wears 4 L of oxygen all the time at home.  She states several days ago she developed some shortness of breath was seen in the emergency department improved and went home.  She states Sunday she felt somewhat short of breath and called EMS they evaluated her but after evaluation she was reassured and stayed home.  Patient states around lunchtime today she began feeling short of breath once again so she came to the emergency department for evaluation.  Patient received duo nebs in triage and states she feels much better now.  Patient denies any shortness of breath.  Currently satting 100% on 4 L during my evaluation.  Denies any chest pain at any point.  No lower extremity edema or pain.  No recent fever or increased cough.   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   Incarcerated hernia of abdominal cavity 08/04/2018   SBO (small bowel obstruction) (HCC) 04/22/2018   Incarcerated hernia 04/14/2018   Aneurysm (HCC) 07/10/2016   Nonspecific abnormal finding 05/11/2015   Allergic rhinitis 05/11/2015   Anxiety 05/11/2015   Bronchitis, chronic (HCC) 05/11/2015    CCF (congestive cardiac failure) (HCC) 05/11/2015   Clinical depression 05/11/2015   DDD (degenerative disc disease), 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/11/2015   Essential (primary) hypertension 05/11/2015   Acid reflux 05/11/2015   HLD (hyperlipidemia) 05/11/2015   Adult hypothyroidism 05/11/2015   Cervical dysplasia, mild 05/11/2015   Neuropathy 05/11/2015   Adiposity 05/11/2015   Obstructive apnea 05/11/2015   Arthritis, degenerative 05/11/2015    Past Surgical History:  Procedure Laterality Date   ABDOMINAL HYSTERECTOMY     BOWEL RESECTION N/A 04/14/2018   Procedure: SMALL BOWEL RESECTION;  Surgeon: Leafy Ro, MD;  Location: [facility] ORS;  Service: General;  Laterality: N/A;   BREAST BIOPSY Right    benign   CATARACT EXTRACTION W/PHACO Left 07/03/2016   Procedure: CATARACT EXTRACTION PHACO AND INTRAOCULAR LENS PLACEMENT (IOC);  Surgeon: Galen Manila, MD;  Location: [facility] ORS;  Service: Ophthalmology;  Laterality: Left;  Lot: [id number] H [place]: 00:40.1 AP%: 17.4 CDE:6.94   HERNIA REPAIR     LAPAROTOMY N/A 09/04/2018   Procedure: EXPLORATORY LAPAROTOMY;  Surgeon: Henrene Dodge, MD;  Location: [facility] ORS;  Service: General;  Laterality: N/A;   TUBAL LIGATION     VENTRAL HERNIA REPAIR N/A 04/14/2018   Procedure: HERNIA REPAIR VENTRAL ADULT;  Surgeon: Leafy Ro, MD;  Location: [facility] ORS;  Service: General;  Laterality: N/A;    Prior to Admission medications   Medication Sig Start Date End Date Taking? Authorizing Provider  acetaminophen (TYLENOL)  500 MG tablet Take 500 mg by mouth every 6 (six) hours as needed.    [provider]  aspirin EC 81 MG tablet Take 81 mg by mouth daily.    [provider]  Budeson-Glycopyrrol-Formoterol (BREZTRI AEROSPHERE) 160-9-4.8 MCG/ACT AERO Inhale 2 puffs into the lungs 2 (two) times daily. 09/19/21   Sallyanne Kuster, NP  cetirizine (ZYRTEC) 10 MG tablet TAKE 1 TABLET BY MOUTH EVERY DAY 03/31/21   Maple Hudson., MD  enalapril (VASOTEC) 20  MG tablet Take 1 tablet (20 mg total) by mouth 2 (two) times daily. 09/26/21   Maple Hudson., MD  furosemide (LASIX) 20 MG tablet TAKE 2 TABLETS (40MG  TOTAL) BY MOUTH EVERY DAY 06/05/21   08/05/21., MD  gabapentin (NEURONTIN) 300 MG capsule TAKE 2 CAPSULES IN THE MORNING, 1 CAPSULE IN THE AFTERNOON AND 2 CAPSULES IN THE EVENING 02/14/21   04/16/21., MD  ipratropium-albuterol (DUONEB) 0.5-2.5 (3) MG/3ML SOLN INHALE 1 VIAL VIA NEBULIZER BY MOUTH 3 TIMES A DAY AS NEEDED FOR COPD/ASTHMA 08/28/21   08/30/21, MD  levothyroxine (SYNTHROID) 125 MCG tablet TAKE 1 TABLET BY MOUTH EVERY DAY 06/28/21   06/30/21., MD  meclizine (ANTIVERT) 25 MG tablet Take 1 tablet (25 mg total) by mouth 3 (three) times daily as needed for dizziness. 06/27/21   06/29/21., MD  MULTIPLE VITAMIN PO Take 1 tablet by mouth daily.     [provider]  omeprazole (PRILOSEC) 40 MG capsule TAKE 1 CAPSULE BY MOUTH EVERY DAY 07/06/21   09/05/21., MD  ondansetron (ZOFRAN-ODT) 8 MG disintegrating tablet Take by mouth. 06/14/21   [provider]  OXYGEN Inhale 4 L into the lungs.    [provider]  phenylephrine (NEO-SYNEPHRINE) 1 % nasal spray Place 1 drop into both nostrils every 6 (six) hours as needed for congestion.    [provider]  sertraline (ZOLOFT) 50 MG tablet Take 1 tablet (50 mg total) by mouth daily. 09/19/21   09/21/21, NP    Allergies  Allergen Reactions   Codeine Nausea And Vomiting and Nausea Only    Family History  Problem Relation Age of Onset   Heart disease Mother    Drug abuse Other    Hypertension Other     Social History Social History   Tobacco Use   Smoking status: Former    Packs/day: 0.50    Years: 15.00    Pack years: 7.50    Types: Cigarettes   Smokeless tobacco: Never   Tobacco comments:    30-40 years ago  Vaping Use   Vaping Use: Never used  Substance Use Topics    Alcohol use: No    Alcohol/week: 0.0 standard drinks   Drug use: No    Review of Systems Constitutional: Negative for fever. Cardiovascular: Negative for chest pain. Respiratory: Positive for shortness of breath.  Largely resolved. Gastrointestinal: Negative for abdominal pain, vomiting  Musculoskeletal: Negative for musculoskeletal complaints Neurological: Negative for headache All other ROS negative  ____________________________________________   PHYSICAL EXAM:  VITAL SIGNS: ED Triage Vitals  Enc Vitals Group     BP 10/09/21 1254 (!) 127/59     Pulse Rate 10/09/21 1254 84     Resp 10/09/21 1254 20     Temp 10/09/21 1254 98.4 F (36.9 C)     Temp Source 10/09/21 1254 Oral     SpO2 10/09/21 1255 94 %  Weight 10/09/21 1254 207 lb (93.9 kg)     Height 10/09/21 1254 5\' 5"  (1.651 m)     Head Circumference --      Peak Flow --      Pain Score 10/09/21 1254 0     Pain Loc --      Pain Edu? --      Excl. in GC? --    Constitutional: Alert and oriented. Well appearing and in no distress. Eyes: Normal exam ENT      Head: Normocephalic and atraumatic.      Mouth/Throat: Mucous membranes are moist. Cardiovascular: Normal rate, regular rhythm.  Respiratory: Normal respiratory effort without tachypnea nor retractions. Breath sounds are clear  Gastrointestinal: Soft and nontender. No distention.   Musculoskeletal: Nontender with normal range of motion in all extremities.  Neurologic:  Normal speech and language. No gross focal neurologic deficits  Skin:  Skin is warm, dry and intact.  Psychiatric: Mood and affect are normal.   ____________________________________________    EKG  EKG viewed and interpreted by myself shows a sinus rhythm at 78 bpm with a narrow QRS, left axis deviation, largely normal intervals occasional PVC.  No concerning ST changes.  ____________________________________________    RADIOLOGY  Chest x-ray shows no concerning  findings  ____________________________________________   INITIAL IMPRESSION / ASSESSMENT AND PLAN / ED COURSE  Pertinent labs & imaging results that were available during my care of the patient were reviewed by me and considered in my medical decision making (see chart for details).   Patient presents to the emergency department for worsening shortness of breath.  Patient states after breathing treatment she feels much better and denies any shortness of breath currently.  During my evaluation satting 100% on room air.  No wheeze on my evaluation good air movement bilaterally.  Normal respiratory rate.  Patient is afebrile.  Patient's work-up today in the emergency department is overall reassuring.  Patient states she feels well.  Given the reassuring work-up including negative flu and COVID.  Negative troponin.  Clear chest x-ray.  I believe the patient is safe for discharge home with PCP follow-up.  We will start the patient on prednisone as well as a short course of antibiotics for possible bronchitis which could be flaring her COPD.  Patient is agreeable to plan of care and will follow up with her doctor.  I discussed my typical COPD return precautions.  Kristen Schroeder was evaluated in Emergency Department on 10/09/2021 for the symptoms described in the history of present illness. She was evaluated in the context of the global COVID-19 pandemic, which necessitated consideration that the patient might be at risk for infection with the SARS-CoV-2 virus that causes COVID-19. Institutional protocols and algorithms that pertain to the evaluation of patients at risk for COVID-19 are in a state of rapid change based on information released by regulatory bodies including the CDC and federal and state organizations. These policies and algorithms were followed during the patient's care in the ED.  ____________________________________________   FINAL CLINICAL IMPRESSION(S) / ED DIAGNOSES  COPD     14/03/2021, MD 10/09/21 1914

## 2021-10-14 DIAGNOSIS — J449 Chronic obstructive pulmonary disease, unspecified: Secondary | ICD-10-CM | POA: Diagnosis not present

## 2021-10-15 ENCOUNTER — Encounter: Payer: Self-pay | Admitting: Nurse Practitioner

## 2021-10-15 DIAGNOSIS — J449 Chronic obstructive pulmonary disease, unspecified: Secondary | ICD-10-CM | POA: Diagnosis not present

## 2021-10-18 ENCOUNTER — Encounter: Payer: Medicare Other | Admitting: *Deleted

## 2021-10-18 DIAGNOSIS — I10 Essential (primary) hypertension: Secondary | ICD-10-CM

## 2021-10-18 DIAGNOSIS — J9611 Chronic respiratory failure with hypoxia: Secondary | ICD-10-CM

## 2021-10-18 DIAGNOSIS — F325 Major depressive disorder, single episode, in full remission: Secondary | ICD-10-CM

## 2021-10-18 DIAGNOSIS — G629 Polyneuropathy, unspecified: Secondary | ICD-10-CM

## 2021-10-18 NOTE — Chronic Care Management (AMB) (Signed)
Chronic Care Management    Clinical Social Work Note  10/18/2021 Name: Kristen Schroeder MRN: [id number] DOB: 09-21-1941  Kristen Schroeder is a 80 y.o. year old female who is a primary care patient of Maple Hudson., MD. The CCM team was consulted to assist the patient with chronic disease management and/or care coordination needs related to: [employer] .   Engaged with patient by telephone for follow up visit in response to provider referral for social work chronic care management and care coordination services.   Consent to Services:  The patient was given information about Chronic Care Management services, agreed to services, and gave verbal consent prior to initiation of services.  Please see initial visit note for detailed documentation.   Patient agreed to services and consent obtained.   Assessment: Review of patient past medical history, allergies, medications, and health status, including review of relevant consultants reports was performed today as part of a comprehensive evaluation and provision of chronic care management and care coordination services.     SDOH (Social Determinants of Health) assessments and interventions performed:    Advanced Directives Status: Not addressed in this encounter.  CCM Care Plan  Allergies  Allergen Reactions   Codeine Nausea And Vomiting and Nausea Only    Outpatient Encounter Medications as of 10/18/2021  Medication Sig Note   acetaminophen (TYLENOL) 500 MG tablet Take 500 mg by mouth every 6 (six) hours as needed.    aspirin EC 81 MG tablet Take 81 mg by mouth daily.    azithromycin (ZITHROMAX) 250 MG tablet Take 1 tablet (250 mg total) by mouth daily.    Budeson-Glycopyrrol-Formoterol (BREZTRI AEROSPHERE) 160-9-4.8 MCG/ACT AERO Inhale 2 puffs into the lungs 2 (two) times daily.    cetirizine (ZYRTEC) 10 MG tablet TAKE 1 TABLET BY MOUTH EVERY DAY    enalapril (VASOTEC) 20 MG tablet Take 1 tablet (20 mg total) by mouth 2  (two) times daily.    furosemide (LASIX) 20 MG tablet TAKE 2 TABLETS (40MG  TOTAL) BY MOUTH EVERY DAY    gabapentin (NEURONTIN) 300 MG capsule TAKE 2 CAPSULES IN THE MORNING, 1 CAPSULE IN THE AFTERNOON AND 2 CAPSULES IN THE EVENING    ipratropium-albuterol (DUONEB) 0.5-2.5 (3) MG/3ML SOLN INHALE 1 VIAL VIA NEBULIZER BY MOUTH 3 TIMES A DAY AS NEEDED FOR COPD/ASTHMA    levothyroxine (SYNTHROID) 125 MCG tablet TAKE 1 TABLET BY MOUTH EVERY DAY    meclizine (ANTIVERT) 25 MG tablet Take 1 tablet (25 mg total) by mouth 3 (three) times daily as needed for dizziness.    MULTIPLE VITAMIN PO Take 1 tablet by mouth daily.     omeprazole (PRILOSEC) 40 MG capsule TAKE 1 CAPSULE BY MOUTH EVERY DAY    ondansetron (ZOFRAN-ODT) 8 MG disintegrating tablet Take by mouth.    OXYGEN Inhale 4 L into the lungs. 10/15/2018: Patient uses 3 to 3 1/2 liters continuously   phenylephrine (NEO-SYNEPHRINE) 1 % nasal spray Place 1 drop into both nostrils every 6 (six) hours as needed for congestion.    predniSONE (DELTASONE) 10 MG tablet Take 1 tablet (10 mg total) by mouth daily. Day 1-3: take 4 tablets PO daily Day 4-6: take 3 tablets PO daily Day 7-9: take 2 tablets PO daily Day 10-12: take 1 tablet PO daily    sertraline (ZOLOFT) 50 MG tablet Take 1 tablet (50 mg total) by mouth daily.    No facility-administered encounter medications on file as of 10/18/2021.    Patient Active  Problem List   Diagnosis Date Noted   Depression, major, single episode, complete remission (HCC) 05/20/2020   Incarcerated hernia of abdominal cavity 08/04/2018   SBO (small bowel obstruction) (HCC) 04/22/2018   Incarcerated hernia 04/14/2018   Aneurysm (HCC) 07/10/2016   Nonspecific abnormal finding 05/11/2015   Allergic rhinitis 05/11/2015   Anxiety 05/11/2015   Bronchitis, chronic (HCC) 05/11/2015   CCF (congestive cardiac failure) (HCC) 05/11/2015   Clinical depression 05/11/2015   DDD (degenerative disc disease), 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/11/2015    Essential (primary) hypertension 05/11/2015   Acid reflux 05/11/2015   HLD (hyperlipidemia) 05/11/2015   Adult hypothyroidism 05/11/2015   Cervical dysplasia, mild 05/11/2015   Neuropathy 05/11/2015   Adiposity 05/11/2015   Obstructive apnea 05/11/2015   Arthritis, degenerative 05/11/2015    Conditions to be addressed/monitored:    CHF, HTN, and COPD; Level of care concerns Care Plan : [facility] (Adult)  Updates made by Wenda Overland, LCSW since 10/18/2021 12:00 AM     Problem: [facility] AMB "PATIENT-SPECIFIC PROBLEM"   Priority: Medium  Note:   CARE PLAN ENTRY (see longitudinal plan of care for additional care plan information)  Current Barriers:  Patient with HTN, COPD, and DM in need of assistance with connection to community resources  Knowledge deficits and need for support, education and care coordination related to community resources support  Limited social support  Clinical Goal(s)  Over the next 90 days, patient will work with care management team member to address concerns related to community resources for in home care   Interventions  Continued to assess patient's care coordination needs related to in home care needs and discussed ongoing care management follow up Confirmed that patient's daughter continues to provide care until alternative arrangements are made Patient confirmed improvement in mood and ability to manage anxiety with daily medication adherence Self management skills discussed including medication adherence, considering mental health therapy-patient declining mental health follow up at this time due to improvement in mood Status of Referral to the [facility] discussed-Kristen Schroeder Tour manager for [employer] for status update [phone]. Left VM for a return call regarding status  Patient Self Care Activities & Deficits:  Patient is unable to independently  navigate community resource options without care coordination support  Acknowledges deficits and is motivated to resolve concern  Limited social support Self administers medications as prescribed Attends all scheduled provider appointments  Please see past updates related to this goal by clicking on the "Past Updates" button in the selected goal         Follow Up Plan: Appointment scheduled for SW follow up with client by phone on: 11/08/20       Verna Czech, LCSW Clinical Social Worker  [facility] [phone]

## 2021-10-18 NOTE — Patient Instructions (Signed)
Visit Information  Thank you for taking time to visit with me today. Please don't hesitate to contact me if I can be of assistance to you before our next scheduled telephone appointment.  Following are the goals we discussed today:   - begin a notebook of services in my neighborhood or community - follow-up on any referrals for help I am given - think ahead to make sure my need does not become an emergency - have a back-up plan - make a list of family or friends that I can call  -continue follow up with [facility](Community Alternatives Referral)  Our next appointment is by telephone on 11/08/20 at 9am  Please call the care guide team at [phone] if you need to cancel or reschedule your appointment.   If you are experiencing a Mental Health or Behavioral Health Crisis or need someone to talk to, please call the Suicide and Crisis Lifeline: 988   The patient verbalized understanding of instructions, educational materials, and care plan provided today and declined offer to receive copy of patient instructions, educational materials, and care plan.   Telephone follow up appointment with care management team member scheduled for: 11/08/21   Verna Czech, LCSW Clinical Social Worker  [facility] [phone]

## 2021-10-18 NOTE — Progress Notes (Signed)
This encounter was created in error - please disregard.

## 2021-10-30 DIAGNOSIS — R404 Transient alteration of awareness: Secondary | ICD-10-CM | POA: Diagnosis not present

## 2021-10-30 DIAGNOSIS — Z743 Need for continuous supervision: Secondary | ICD-10-CM | POA: Diagnosis not present

## 2021-11-01 DIAGNOSIS — J449 Chronic obstructive pulmonary disease, unspecified: Secondary | ICD-10-CM | POA: Diagnosis not present

## 2021-11-05 DIAGNOSIS — 419620001 Death: Secondary | SNOMED CT | POA: Diagnosis not present

## 2021-11-05 DEATH — deceased

## 2021-11-07 ENCOUNTER — Telehealth: Payer: Self-pay | Admitting: Family Medicine

## 2021-11-07 NOTE — Telephone Encounter (Signed)
Jola Babinski from [facility].  phone # [phone]  fax # [phone]  Jola Babinski from [facility] inquiring if Sherrie Mustache or Dr. Beryle Flock will sign patient death certificate. Caller would like a follow up call today.

## 2021-11-08 NOTE — Telephone Encounter (Signed)
I called [facility]. I was advised that patients time of death was 11:26 am, date of death 29-Nov-2021.

## 2021-11-08 NOTE — Telephone Encounter (Signed)
The death certificate cannot be completed without knowing the approximate of death. I need to know that to complete it.

## 2021-11-09 ENCOUNTER — Telehealth: Payer: Medicare Other

## 2021-11-14 ENCOUNTER — Ambulatory Visit: Payer: Medicare Other | Admitting: Family Medicine

## 2021-12-01 ENCOUNTER — Other Ambulatory Visit: Payer: Self-pay | Admitting: Family Medicine

## 2021-12-01 DIAGNOSIS — R600 Localized edema: Secondary | ICD-10-CM

## 2021-12-26 ENCOUNTER — Ambulatory Visit: Payer: Medicare Other | Admitting: Internal Medicine

## 2022-01-25 ENCOUNTER — Ambulatory Visit: Payer: Medicare Other | Admitting: Family Medicine
